# Patient Record
Sex: Female | Born: 1950 | Race: Black or African American | Hispanic: No | Marital: Married | Smoking: Never smoker
Health system: Southern US, Community
[De-identification: ages and names within clinical notes are randomized; demographics above are authoritative.]

## PROBLEM LIST (undated history)

## (undated) DIAGNOSIS — E669 Obesity, unspecified: Secondary | ICD-10-CM

## (undated) DIAGNOSIS — D509 Iron deficiency anemia, unspecified: Secondary | ICD-10-CM

## (undated) DIAGNOSIS — E785 Hyperlipidemia, unspecified: Secondary | ICD-10-CM

## (undated) DIAGNOSIS — R931 Abnormal findings on diagnostic imaging of heart and coronary circulation: Secondary | ICD-10-CM

## (undated) DIAGNOSIS — E119 Type 2 diabetes mellitus without complications: Secondary | ICD-10-CM

## (undated) DIAGNOSIS — K635 Polyp of colon: Secondary | ICD-10-CM

## (undated) DIAGNOSIS — E538 Deficiency of other specified B group vitamins: Secondary | ICD-10-CM

## (undated) DIAGNOSIS — D563 Thalassemia minor: Secondary | ICD-10-CM

## (undated) DIAGNOSIS — I10 Essential (primary) hypertension: Secondary | ICD-10-CM

## (undated) HISTORY — DX: Type 2 diabetes mellitus without complications: E11.9

## (undated) HISTORY — DX: Obesity, unspecified: E66.9

## (undated) HISTORY — PX: UPPER GASTROINTESTINAL ENDOSCOPY: SHX188

## (undated) HISTORY — PX: COLONOSCOPY W/ POLYPECTOMY: SHX1380

## (undated) HISTORY — DX: Iron deficiency anemia, unspecified: D50.9

## (undated) HISTORY — DX: Hyperlipidemia, unspecified: E78.5

## (undated) HISTORY — DX: Abnormal findings on diagnostic imaging of heart and coronary circulation: R93.1

## (undated) HISTORY — DX: Essential (primary) hypertension: I10

## (undated) HISTORY — DX: Thalassemia minor: D56.3

## (undated) HISTORY — DX: Deficiency of other specified B group vitamins: E53.8

## (undated) HISTORY — PX: TUBAL LIGATION: SHX77

---

## 1977-04-29 HISTORY — PX: OTHER SURGICAL HISTORY: SHX169

## 1999-04-30 HISTORY — PX: CHOLECYSTECTOMY: SHX55

## 2000-02-21 ENCOUNTER — Ambulatory Visit (HOSPITAL_COMMUNITY): Admission: RE | Admit: 2000-02-21 | Discharge: 2000-02-22 | Payer: Self-pay | Admitting: Surgery

## 2000-02-21 ENCOUNTER — Encounter (INDEPENDENT_AMBULATORY_CARE_PROVIDER_SITE_OTHER): Payer: Self-pay | Admitting: *Deleted

## 2000-02-26 ENCOUNTER — Encounter: Admission: RE | Admit: 2000-02-26 | Discharge: 2000-05-26 | Payer: Self-pay | Admitting: Surgery

## 2003-09-09 ENCOUNTER — Ambulatory Visit (HOSPITAL_COMMUNITY): Admission: RE | Admit: 2003-09-09 | Discharge: 2003-09-09 | Payer: Self-pay | Admitting: Family Medicine

## 2004-01-08 ENCOUNTER — Emergency Department (HOSPITAL_COMMUNITY): Admission: EM | Admit: 2004-01-08 | Discharge: 2004-01-08 | Payer: Self-pay | Admitting: Emergency Medicine

## 2004-04-06 ENCOUNTER — Ambulatory Visit: Payer: Self-pay | Admitting: Family Medicine

## 2004-07-06 ENCOUNTER — Ambulatory Visit: Payer: Self-pay | Admitting: Family Medicine

## 2004-09-05 ENCOUNTER — Ambulatory Visit: Payer: Self-pay | Admitting: Family Medicine

## 2004-09-10 ENCOUNTER — Ambulatory Visit (HOSPITAL_COMMUNITY): Admission: RE | Admit: 2004-09-10 | Discharge: 2004-09-10 | Payer: Self-pay | Admitting: Family Medicine

## 2004-10-17 ENCOUNTER — Ambulatory Visit: Payer: Self-pay | Admitting: Family Medicine

## 2005-01-08 ENCOUNTER — Ambulatory Visit: Payer: Self-pay | Admitting: Family Medicine

## 2005-02-26 ENCOUNTER — Ambulatory Visit: Payer: Self-pay | Admitting: Family Medicine

## 2005-03-28 ENCOUNTER — Ambulatory Visit (HOSPITAL_COMMUNITY): Admission: RE | Admit: 2005-03-28 | Discharge: 2005-03-28 | Payer: Self-pay | Admitting: General Surgery

## 2005-03-28 ENCOUNTER — Encounter (INDEPENDENT_AMBULATORY_CARE_PROVIDER_SITE_OTHER): Payer: Self-pay | Admitting: General Surgery

## 2005-04-26 ENCOUNTER — Ambulatory Visit: Payer: Self-pay | Admitting: Family Medicine

## 2005-07-25 ENCOUNTER — Ambulatory Visit: Payer: Self-pay | Admitting: Family Medicine

## 2005-09-16 ENCOUNTER — Ambulatory Visit (HOSPITAL_COMMUNITY): Admission: RE | Admit: 2005-09-16 | Discharge: 2005-09-16 | Payer: Self-pay | Admitting: Family Medicine

## 2005-10-25 ENCOUNTER — Ambulatory Visit: Payer: Self-pay | Admitting: Family Medicine

## 2006-01-31 ENCOUNTER — Ambulatory Visit: Payer: Self-pay | Admitting: Family Medicine

## 2006-01-31 ENCOUNTER — Other Ambulatory Visit: Admission: RE | Admit: 2006-01-31 | Discharge: 2006-01-31 | Payer: Self-pay | Admitting: Family Medicine

## 2006-05-28 ENCOUNTER — Encounter: Payer: Self-pay | Admitting: Family Medicine

## 2006-05-28 LAB — CONVERTED CEMR LAB: Hgb A1c MFr Bld: 6.8 % — ABNORMAL HIGH (ref 4.6–6.1)

## 2006-06-02 ENCOUNTER — Ambulatory Visit: Payer: Self-pay | Admitting: Family Medicine

## 2006-09-01 ENCOUNTER — Ambulatory Visit: Payer: Self-pay | Admitting: Family Medicine

## 2006-09-01 LAB — CONVERTED CEMR LAB
Albumin: 3.6 g/dL (ref 3.5–5.2)
BUN: 10 mg/dL (ref 6–23)
CO2: 24 meq/L (ref 19–32)
Chloride: 102 meq/L (ref 96–112)
HDL: 49 mg/dL (ref >39)
LDL Cholesterol: 124 mg/dL — ABNORMAL HIGH (ref 0–99)
Potassium: 4 meq/L (ref 3.5–5.3)
Total Protein: 7.1 g/dL (ref 6.0–8.3)
Triglycerides: 86 mg/dL (ref <150)
VLDL: 17 mg/dL (ref 0–40)

## 2006-09-19 ENCOUNTER — Ambulatory Visit (HOSPITAL_COMMUNITY): Admission: RE | Admit: 2006-09-19 | Discharge: 2006-09-19 | Payer: Self-pay | Admitting: Family Medicine

## 2006-10-14 ENCOUNTER — Ambulatory Visit: Payer: Self-pay | Admitting: Family Medicine

## 2006-11-25 ENCOUNTER — Ambulatory Visit: Payer: Self-pay | Admitting: Family Medicine

## 2007-01-05 ENCOUNTER — Ambulatory Visit: Payer: Self-pay | Admitting: Family Medicine

## 2007-01-06 ENCOUNTER — Encounter: Payer: Self-pay | Admitting: Family Medicine

## 2007-02-03 ENCOUNTER — Ambulatory Visit: Payer: Self-pay | Admitting: Family Medicine

## 2007-02-04 ENCOUNTER — Encounter: Payer: Self-pay | Admitting: Family Medicine

## 2007-02-26 ENCOUNTER — Encounter: Payer: Self-pay | Admitting: Family Medicine

## 2007-02-26 LAB — CONVERTED CEMR LAB
BUN: 16 mg/dL (ref 6–23)
Basophils Relative: 1 % (ref 0–1)
Chloride: 106 meq/L (ref 96–112)
Glucose, Bld: 143 mg/dL — ABNORMAL HIGH (ref 70–99)
LDL Cholesterol: 121 mg/dL — ABNORMAL HIGH (ref 0–99)
Lymphocytes Relative: 20 % (ref 12–46)
Lymphs Abs: 1.3 10*3/uL (ref 0.7–3.3)
Monocytes Relative: 6 % (ref 3–11)
Neutro Abs: 4.5 10*3/uL (ref 1.7–7.7)
Neutrophils Relative %: 70 % (ref 43–77)
Potassium: 4.2 meq/L (ref 3.5–5.3)
RBC: 4.52 M/uL (ref 3.87–5.11)
Total Protein: 6.4 g/dL (ref 6.0–8.3)
Triglycerides: 86 mg/dL (ref <150)
VLDL: 17 mg/dL (ref 0–40)
WBC: 6.4 10*3/uL (ref 4.0–10.5)

## 2007-02-27 ENCOUNTER — Encounter: Payer: Self-pay | Admitting: Family Medicine

## 2007-03-02 ENCOUNTER — Encounter: Payer: Self-pay | Admitting: Family Medicine

## 2007-03-02 ENCOUNTER — Ambulatory Visit: Payer: Self-pay | Admitting: Family Medicine

## 2007-03-02 ENCOUNTER — Other Ambulatory Visit: Admission: RE | Admit: 2007-03-02 | Discharge: 2007-03-02 | Payer: Self-pay | Admitting: Family Medicine

## 2007-03-17 ENCOUNTER — Ambulatory Visit (HOSPITAL_COMMUNITY): Admission: RE | Admit: 2007-03-17 | Discharge: 2007-03-17 | Payer: Self-pay | Admitting: General Surgery

## 2007-04-30 ENCOUNTER — Encounter: Payer: Self-pay | Admitting: Family Medicine

## 2007-05-20 ENCOUNTER — Ambulatory Visit: Payer: Self-pay | Admitting: Family Medicine

## 2007-08-20 ENCOUNTER — Encounter: Payer: Self-pay | Admitting: Family Medicine

## 2007-08-20 DIAGNOSIS — Z794 Long term (current) use of insulin: Secondary | ICD-10-CM

## 2007-08-20 DIAGNOSIS — E1165 Type 2 diabetes mellitus with hyperglycemia: Secondary | ICD-10-CM

## 2007-08-20 DIAGNOSIS — I10 Essential (primary) hypertension: Secondary | ICD-10-CM

## 2007-08-20 DIAGNOSIS — E785 Hyperlipidemia, unspecified: Secondary | ICD-10-CM | POA: Insufficient documentation

## 2007-08-20 LAB — CONVERTED CEMR LAB
ALT: 17 units/L (ref 0–35)
Albumin: 3.8 g/dL (ref 3.5–5.2)
Bilirubin, Direct: <0.1 mg/dL (ref 0.0–0.3)
Cholesterol: 174 mg/dL (ref 0–200)
Glucose, Bld: 80 mg/dL (ref 70–99)
HDL: 47 mg/dL (ref >39)
Potassium: 4.3 meq/L (ref 3.5–5.3)
Sodium: 141 meq/L (ref 135–145)
Total CHOL/HDL Ratio: 3.7
Total Protein: 7.3 g/dL (ref 6.0–8.3)
Triglycerides: 82 mg/dL (ref <150)
VLDL: 16 mg/dL (ref 0–40)

## 2007-08-25 ENCOUNTER — Ambulatory Visit: Payer: Self-pay | Admitting: Family Medicine

## 2007-08-25 ENCOUNTER — Encounter: Payer: Self-pay | Admitting: Family Medicine

## 2007-09-22 ENCOUNTER — Ambulatory Visit (HOSPITAL_COMMUNITY): Admission: RE | Admit: 2007-09-22 | Discharge: 2007-09-22 | Payer: Self-pay | Admitting: Family Medicine

## 2007-11-21 ENCOUNTER — Encounter: Payer: Self-pay | Admitting: Family Medicine

## 2007-11-21 LAB — CONVERTED CEMR LAB: Retic Ct Pct: 0.7 % (ref 0.4–3.1)

## 2007-11-24 ENCOUNTER — Ambulatory Visit: Payer: Self-pay | Admitting: Family Medicine

## 2007-11-24 ENCOUNTER — Encounter: Payer: Self-pay | Admitting: Family Medicine

## 2007-11-24 LAB — CONVERTED CEMR LAB: Glucose, Bld: 196 mg/dL

## 2008-01-25 ENCOUNTER — Ambulatory Visit: Payer: Self-pay | Admitting: Family Medicine

## 2008-03-04 ENCOUNTER — Encounter: Payer: Self-pay | Admitting: Family Medicine

## 2008-03-08 ENCOUNTER — Other Ambulatory Visit: Admission: RE | Admit: 2008-03-08 | Discharge: 2008-03-08 | Payer: Self-pay | Admitting: Family Medicine

## 2008-03-08 ENCOUNTER — Ambulatory Visit: Payer: Self-pay | Admitting: Family Medicine

## 2008-03-08 ENCOUNTER — Encounter: Payer: Self-pay | Admitting: Family Medicine

## 2008-06-06 ENCOUNTER — Encounter: Payer: Self-pay | Admitting: Family Medicine

## 2008-06-06 LAB — CONVERTED CEMR LAB
ALT: 18 units/L (ref 0–35)
Albumin: 3.7 g/dL (ref 3.5–5.2)
HDL: 51 mg/dL (ref >39)
LDL Cholesterol: 103 mg/dL — ABNORMAL HIGH (ref 0–99)
Total CHOL/HDL Ratio: 3.3
Total Protein: 7.2 g/dL (ref 6.0–8.3)
Triglycerides: 82 mg/dL (ref <150)
VLDL: 16 mg/dL (ref 0–40)

## 2008-06-08 ENCOUNTER — Ambulatory Visit: Payer: Self-pay | Admitting: Family Medicine

## 2008-06-08 LAB — CONVERTED CEMR LAB: Blood Glucose, Fasting: 127 mg/dL

## 2008-06-09 LAB — CONVERTED CEMR LAB

## 2008-09-23 ENCOUNTER — Encounter: Payer: Self-pay | Admitting: Family Medicine

## 2008-09-23 LAB — CONVERTED CEMR LAB
Alkaline Phosphatase: 125 units/L — ABNORMAL HIGH (ref 39–117)
Basophils Absolute: 0 10*3/uL (ref 0.0–0.1)
Basophils Relative: 1 % (ref 0–1)
Bilirubin, Direct: 0.1 mg/dL (ref 0.0–0.3)
Eosinophils Relative: 3 % (ref 0–5)
Indirect Bilirubin: 0.2 mg/dL (ref 0.0–0.9)
LDL Cholesterol: 90 mg/dL (ref 0–99)
Lymphocytes Relative: 21 % (ref 12–46)
MCHC: 29.5 g/dL — ABNORMAL LOW (ref 30.0–36.0)
Neutro Abs: 5.8 10*3/uL (ref 1.7–7.7)
Platelets: 364 10*3/uL (ref 150–400)
RDW: 17.1 % — ABNORMAL HIGH (ref 11.5–15.5)
Total Protein: 7.1 g/dL (ref 6.0–8.3)
Triglycerides: 102 mg/dL (ref <150)
VLDL: 20 mg/dL (ref 0–40)

## 2008-09-28 ENCOUNTER — Ambulatory Visit: Payer: Self-pay | Admitting: Family Medicine

## 2008-09-28 LAB — CONVERTED CEMR LAB
Blood Glucose, Fasting: 117 mg/dL
Hgb A1c MFr Bld: 6.8 %

## 2009-01-09 ENCOUNTER — Ambulatory Visit (HOSPITAL_COMMUNITY): Admission: RE | Admit: 2009-01-09 | Discharge: 2009-01-09 | Payer: Self-pay | Admitting: Family Medicine

## 2009-01-20 ENCOUNTER — Ambulatory Visit: Payer: Self-pay | Admitting: Family Medicine

## 2009-01-20 LAB — CONVERTED CEMR LAB
ALT: 18 units/L (ref 0–35)
AST: 19 units/L (ref 0–37)
Albumin: 3.9 g/dL (ref 3.5–5.2)
Bilirubin, Direct: 0.1 mg/dL (ref 0.0–0.3)
Blood Glucose, Fasting: 149 mg/dL
Calcium: 9.3 mg/dL (ref 8.4–10.5)
Cholesterol: 148 mg/dL (ref 0–200)
Glucose, Bld: 121 mg/dL — ABNORMAL HIGH (ref 70–99)
HDL: 43 mg/dL (ref >39)
Hgb A1c MFr Bld: 6.8 %
Potassium: 4.6 meq/L (ref 3.5–5.3)
Sodium: 139 meq/L (ref 135–145)
Total CHOL/HDL Ratio: 3.4
Total Protein: 7.1 g/dL (ref 6.0–8.3)
Triglycerides: 91 mg/dL (ref <150)
VLDL: 18 mg/dL (ref 0–40)

## 2009-03-27 LAB — CONVERTED CEMR LAB
Creatinine, Urine: 156.6 mg/dL
Microalb, Ur: 1.17 mg/dL (ref 0.00–1.89)

## 2009-06-01 ENCOUNTER — Ambulatory Visit: Payer: Self-pay | Admitting: Family Medicine

## 2009-06-01 LAB — CONVERTED CEMR LAB: Glucose, Bld: 180 mg/dL

## 2009-07-31 ENCOUNTER — Telehealth: Payer: Self-pay | Admitting: Family Medicine

## 2009-08-04 ENCOUNTER — Encounter: Payer: Self-pay | Admitting: Family Medicine

## 2009-09-14 ENCOUNTER — Other Ambulatory Visit: Admission: RE | Admit: 2009-09-14 | Discharge: 2009-09-14 | Payer: Self-pay | Admitting: Family Medicine

## 2009-09-14 ENCOUNTER — Ambulatory Visit: Payer: Self-pay | Admitting: Family Medicine

## 2009-09-14 DIAGNOSIS — R000 Tachycardia, unspecified: Secondary | ICD-10-CM

## 2009-09-18 LAB — CONVERTED CEMR LAB
BUN: 27 mg/dL — ABNORMAL HIGH (ref 6–23)
Chloride: 93 meq/L — ABNORMAL LOW (ref 96–112)
Glucose, Bld: 181 mg/dL — ABNORMAL HIGH (ref 70–99)
Hgb A1c MFr Bld: 7.8 % — ABNORMAL HIGH (ref <5.7)
Potassium: 4.6 meq/L (ref 3.5–5.3)
Sodium: 133 meq/L — ABNORMAL LOW (ref 135–145)

## 2009-09-19 ENCOUNTER — Telehealth: Payer: Self-pay | Admitting: Family Medicine

## 2009-10-13 LAB — CONVERTED CEMR LAB
BUN: 16 mg/dL (ref 6–23)
Chloride: 104 meq/L (ref 96–112)
Glucose, Bld: 94 mg/dL (ref 70–99)
Potassium: 4.4 meq/L (ref 3.5–5.3)
Sodium: 141 meq/L (ref 135–145)

## 2010-01-04 ENCOUNTER — Ambulatory Visit: Payer: Self-pay | Admitting: Family Medicine

## 2010-01-11 ENCOUNTER — Ambulatory Visit (HOSPITAL_COMMUNITY): Admission: RE | Admit: 2010-01-11 | Discharge: 2010-01-11 | Payer: Self-pay | Admitting: Family Medicine

## 2010-01-11 LAB — CONVERTED CEMR LAB
BUN: 23 mg/dL (ref 6–23)
Bilirubin, Direct: 0.1 mg/dL (ref 0.0–0.3)
CO2: 31 meq/L (ref 19–32)
Glucose, Bld: 192 mg/dL — ABNORMAL HIGH (ref 70–99)
Hgb A1c MFr Bld: 7.5 % — ABNORMAL HIGH (ref <5.7)
Indirect Bilirubin: 0.3 mg/dL (ref 0.0–0.9)
Potassium: 4.4 meq/L (ref 3.5–5.3)
Sodium: 137 meq/L (ref 135–145)
Total Bilirubin: 0.4 mg/dL (ref 0.3–1.2)
Total CHOL/HDL Ratio: 3.5
VLDL: 16 mg/dL (ref 0–40)

## 2010-02-13 ENCOUNTER — Ambulatory Visit: Payer: Self-pay | Admitting: Family Medicine

## 2010-02-13 LAB — HM DIABETES FOOT EXAM

## 2010-02-22 ENCOUNTER — Encounter (INDEPENDENT_AMBULATORY_CARE_PROVIDER_SITE_OTHER): Payer: Self-pay | Admitting: *Deleted

## 2010-04-15 LAB — CONVERTED CEMR LAB
CO2: 23 meq/L (ref 19–32)
Chloride: 104 meq/L (ref 96–112)
Glucose, Bld: 272 mg/dL — ABNORMAL HIGH (ref 70–99)
Potassium: 4 meq/L (ref 3.5–5.3)
Sodium: 138 meq/L (ref 135–145)

## 2010-04-17 ENCOUNTER — Ambulatory Visit: Payer: Self-pay | Admitting: Family Medicine

## 2010-04-18 ENCOUNTER — Encounter: Payer: Self-pay | Admitting: Family Medicine

## 2010-04-18 LAB — CONVERTED CEMR LAB
Creatinine, Urine: 145.4 mg/dL
Microalb Creat Ratio: 12.7 mg/g (ref 0.0–30.0)

## 2010-05-20 ENCOUNTER — Encounter: Payer: Self-pay | Admitting: Family Medicine

## 2010-05-31 NOTE — Assessment & Plan Note (Signed)
Summary: physical   Vital Signs:  Patient profile:   60 year old female Menstrual status:  postmenopausal Height:      66 inches Weight:      245 pounds BMI:     39.69 O2 Sat:      97 % Pulse rate:   120 / minute Pulse rhythm:   regular Resp:     16 per minute BP sitting:   118 / 78  (left arm) Cuff size:   large  Vitals Entered By: Everitt Amber LPN (Sep 14, 2009 9:02 AM)  Nutrition Counseling: Patient's BMI is greater than 25 and therefore counseled on weight management options. CC: CPE  Vision Screening:Left eye with correction: 20 / 20 Right eye with correction: 20 / 20 Both eyes with correction: 20 / 20  Color vision testing: normal      Vision Entered By: Everitt Amber LPN (Sep 14, 2009 9:05 AM)   CC:  CPE.  History of Present Illness: Reports  that she has been  doing well. Denies recent fever or chills. Denies sinus pressure, nasal congestion , ear pain or sore throat. Denies chest congestion, or cough productive of sputum. Denies chest pain, palpitations, PND, orthopnea or leg swelling. Denies abdominal pain, nausea, vomitting, diarrhea or constipation. Denies change in bowel movements or bloody stool. Denies dysuria , frequency, incontinence or hesitancy. Denies  joint pain, swelling, or reduced mobility. Denies headaches, vertigo, seizures. Denies depression, anxiety or insomnia. Denies  rash, lesions, or itch.       Current Medications (verified): 1)  Aspirin 81 Mg  Tbec (Aspirin) .... Take 1 Tablet By Mouth Once A Day 2)  Glipizide 10 Mg  Tabs (Glipizide) .... Take 1 Tablet By Mouth Two Times A Day 3)  Metformin Hcl 1000 Mg  Tabs (Metformin Hcl) .... Take 1 Tablet By Mouth Two Times A Day 4)  Lisinopril 20 Mg  Tabs (Lisinopril) .... Take One Half Tablet By Mouth Daily 5)  Oscal 500/200 D-3 500-200 Mg-Unit  Tabs (Calcium-Vitamin D) .... Take 1 Tablet By Mouth Once A Day 6)  One-A-Day Weight Smart Advance   Tabs (Multiple Vitamins-Minerals) .... Take  1 Tablet By Mouth Once A Day 7)  Mevacor 40 Mg Tabs (Lovastatin) .... 2 Tablets At Bedtime  Allergies (verified): 1)  ! Penicillin 2)  ! * Tetanus Vaccine  Review of Systems      See HPI Eyes:  Denies blurring, discharge, and double vision; repoorts nl eye exam in Jan 2011. Endo:  Denies cold intolerance, excessive hunger, excessive thirst, excessive urination, heat intolerance, polyuria, and weight change; tests fastings daily and the range between 110 to 130 generally, still not exercising and her weight is essentially stable. Heme:  Denies abnormal bruising, bleeding, and enlarge lymph nodes. Allergy:  Denies hives or rash, itching eyes, persistent infections, seasonal allergies, and sneezing.  Physical Exam  General:  Well-developed,obese,,in no acute distress; alert,appropriate and cooperative throughout examination Head:  Normocephalic and atraumatic without obvious abnormalities. No apparent alopecia or balding. Eyes:  No corneal or conjunctival inflammation noted. EOMI. Perrla. Funduscopic exam benign, without hemorrhages, exudates or papilledema. Vision grossly normal. Ears:  External ear exam shows no significant lesions or deformities.  Otoscopic examination reveals clear canals, tympanic membranes are intact bilaterally without bulging, retraction, inflammation or discharge. Hearing is grossly normal bilaterally. Nose:  External nasal examination shows no deformity or inflammation. Nasal mucosa are pink and moist without lesions or exudates. Mouth:  pharynx pink and moist and fair  dentition.   Neck:  No deformities, masses, or tenderness noted. Chest Wall:  No deformities, masses, or tenderness noted. Breasts:  No mass, nodules, thickening, tenderness, bulging, retraction, inflamation, nipple discharge or skin changes noted.   Lungs:  Normal respiratory effort, chest expands symmetrically. Lungs are clear to auscultation, no crackles or wheezes. Heart:  Normal rate and regular  rhythm. S1 and S2 normal without gallop, murmur, click, rub or other extra sounds. Abdomen:  Bowel sounds positive,abdomen soft and non-tender without masses, organomegaly or hernias noted. Rectal:  No external abnormalities noted. Normal sphincter tone. No rectal masses or tenderness.Guaic negative stool Genitalia:  Normal introitus for age, no external lesions, no vaginal discharge, mucosa pink and moist, no vaginal or cervical lesions, no vaginal atrophy, no friaility or hemorrhage, normal uterus size and position, no adnexal masses or tenderness Msk:  No deformity or scoliosis noted of thoracic or lumbar spine.   Pulses:  R and L carotid,radial,femoral,dorsalis pedis and posterior tibial pulses are full and equal bilaterally Extremities:  No clubbing, cyanosis, edema, or deformity noted with normal full range of motion of all joints.   Neurologic:  No cranial nerve deficits noted. Station and gait are normal. Plantar reflexes are down-going bilaterally. DTRs are symmetrical throughout. Sensory, motor and coordinative functions appear intact. Skin:  Intact without suspicious lesions or rashes Cervical Nodes:  No lymphadenopathy noted Axillary Nodes:  No palpable lymphadenopathy Inguinal Nodes:  No significant adenopathy Psych:  Cognition and judgment appear intact. Alert and cooperative with normal attention span and concentration. No apparent delusions, illusions, hallucinations  Diabetes Management Exam:    Foot Exam (with socks and/or shoes not present):       Sensory-Monofilament:          Left foot: normal          Right foot: normal       Inspection:          Left foot: normal          Right foot: normal       Nails:          Left foot: normal          Right foot: normal   Impression & Recommendations:  Problem # 1:  UNSPECIFIED TACHYCARDIA (ICD-785.0) Assessment Comment Only  Orders: EKG w/ Interpretation (93000)NSR, no ischemia, heart rate 100 on EKG  Problem # 2:   DIABETES MELLITUS, TYPE II (ICD-250.00) Assessment: Comment Only  Her updated medication list for this problem includes:    Aspirin 81 Mg Tbec (Aspirin) .Marland Kitchen... Take 1 tablet by mouth once a day    Glipizide 10 Mg Tabs (Glipizide) .Marland Kitchen... Take 1 tablet by mouth two times a day    Metformin Hcl 1000 Mg Tabs (Metformin hcl) .Marland Kitchen... Take 1 tablet by mouth two times a day    Lisinopril 20 Mg Tabs (Lisinopril) .Marland Kitchen... Take one half tablet by mouth daily  Orders: T- Hemoglobin A1C ([id number])  Labs Reviewed: Creat: 1.02 (01/20/2009)    Reviewed HgBA1c results: 7.9 (06/01/2009)  6.8 (01/20/2009)  Problem # 3:  HYPERTENSION (ICD-401.9) Assessment: Unchanged  Her updated medication list for this problem includes:    Lisinopril 20 Mg Tabs (Lisinopril) .Marland Kitchen... Take one half tablet by mouth daily  Orders: T-Basic Metabolic Panel ([id number])  BP today: 118/78 Prior BP: 118/80 (06/01/2009)  Labs Reviewed: K+: 4.6 (01/20/2009) Creat: : 1.02 (01/20/2009)   Chol: 151 (06/17/2009)   HDL: 48 (06/17/2009)   LDL: 89 (06/17/2009)   TG: 72 (  06/17/2009)  Problem # 4:  HYPERLIPIDEMIA (ICD-272.4) Assessment: Comment Only  Her updated medication list for this problem includes:    Mevacor 40 Mg Tabs (Lovastatin) .Marland Kitchen... 2 tablets at bedtime  Labs Reviewed: SGOT: 15 (06/17/2009)   SGPT: 17 (06/17/2009)   HDL:48 (06/17/2009), 43 (01/20/2009)  LDL:89 (06/17/2009), 87 (01/20/2009)  Chol:151 (06/17/2009), 148 (01/20/2009)  Trig:72 (06/17/2009), 91 (01/20/2009)  Problem # 5:  OBESITY (ICD-278.00) Assessment: Unchanged  Ht: 66 (09/14/2009)   Wt: 245 (09/14/2009)   BMI: 39.69 (09/14/2009)  Complete Medication List: 1)  Aspirin 81 Mg Tbec (Aspirin) .... Take 1 tablet by mouth once a day 2)  Glipizide 10 Mg Tabs (Glipizide) .... Take 1 tablet by mouth two times a day 3)  Metformin Hcl 1000 Mg Tabs (Metformin hcl) .... Take 1 tablet by mouth two times a day 4)  Lisinopril 20 Mg Tabs (Lisinopril) .... Take one  half tablet by mouth daily 5)  Oscal 500/200 D-3 500-200 Mg-unit Tabs (Calcium-vitamin d) .... Take 1 tablet by mouth once a day 6)  One-a-day Weight Smart Advance Tabs (Multiple vitamins-minerals) .... Take 1 tablet by mouth once a day 7)  Mevacor 40 Mg Tabs (Lovastatin) .... 2 tablets at bedtime  Other Orders: T-Vitamin D (25-Hydroxy) [phone]) Radiology Referral (Radiology) Pneumococcal Vaccine (09811) Admin 1st Vaccine (91478) Admin 1st Vaccine [facility]) [phone]) Pap Smear (30865) Hemoccult Guaiac-1 spec.(in office) (82270)  Patient Instructions: 1)  Please schedule a follow-up appointment in 3 months. 2)  It is important that you exercise regularly at least 20 minutes 5 times a week. If you develop chest pain, have severe difficulty breathing, or feel very tired , stop exercising immediately and seek medical attention. 3)  You need to lose weight. Consider a lower calorie diet and regular exercise.  4)  HbgA1C prior to visit, ICD-9: 5)  BMP prior to visit, ICD-9:  today 6)  vitamin d level 7)  YOU WILL GET A PNEUMONIA SHOT TODAY     Pneumovax    Vaccine Type: Pneumovax    Site: right deltoid    Mfr: Merck    Dose: 0.5 ml    Route: IM    Given by: Everitt Amber LPN    Exp. Date: 05/25/2010    Lot #: [id number]    Laboratory Results    Stool - Occult Blood Hemmoccult #1: negative Date: 09/14/2009 Comments: [id number] 9R 8/11  118 10 12

## 2010-05-31 NOTE — Assessment & Plan Note (Signed)
Summary: office visit   Vital Signs:  Patient profile:   60 year old female Menstrual status:  postmenopausal Height:      66 inches Weight:      244.13 pounds BMI:     39.55 O2 Sat:      97 % on Room air Pulse rate:   88 / minute Pulse rhythm:   regular Resp:     16 per minute BP sitting:   120 / 70  (left arm)  Vitals Entered By: Adella Hare LPN (January 04, 2010 9:23 AM)  Nutrition Counseling: Patient's BMI is greater than 25 and therefore counseled on weight management options.  O2 Flow:  Room air CC: follow-up visit Is Patient Diabetic? Yes Did you bring your meter with you today? No Pain Assessment Patient in pain? no        CC:  follow-up visit.  History of Present Illness: Reports  that she has been  doing well. She has not however been testing her sugars, has started eating cupcakes, and believes that the sugar is high Denies recent fever or chills. Denies sinus pressure, nasal congestion , ear pain or sore throat. Denies chest congestion, or cough productive of sputum. Denies chest pain, palpitations, PND, orthopnea or leg swelling. Denies abdominal pain, nausea, vomitting, diarrhea or constipation. Denies change in bowel movements or bloody stool. Denies dysuria , frequency, incontinence or hesitancy. Denies  joint pain, swelling, or reduced mobility. Denies headaches, vertigo, seizures. Denies depression, anxiety or insomnia. Denies  rash, lesions, or itch.     Current Medications (verified): 1)  Aspirin 81 Mg  Tbec (Aspirin) .... Take 1 Tablet By Mouth Once A Day 2)  Glipizide 10 Mg  Tabs (Glipizide) .... Take 1 Tablet By Mouth Two Times A Day 3)  Metformin Hcl 1000 Mg  Tabs (Metformin Hcl) .... Take 1 Tablet By Mouth Two Times A Day 4)  Lisinopril 20 Mg  Tabs (Lisinopril) .... Take One Half Tablet By Mouth Daily 5)  Oscal 500/200 D-3 500-200 Mg-Unit  Tabs (Calcium-Vitamin D) .... Take 1 Tablet By Mouth Once A Day 6)  One-A-Day Weight Smart  Advance   Tabs (Multiple Vitamins-Minerals) .... Take 1 Tablet By Mouth Once A Day 7)  Mevacor 40 Mg Tabs (Lovastatin) .... 2 Tablets At Bedtime 8)  Vitamin B-12 1000 Mcg Tabs (Cyanocobalamin) .... One Tab By Mouth Once Daily 9)  Vitamin D (Ergocalciferol) 50000 Unit Caps (Ergocalciferol) .... One Cap By Mouth Every Week  Allergies (verified): 1)  ! Penicillin 2)  ! * Tetanus Vaccine  Review of Systems      See HPI General:  Complains of fatigue. Eyes:  Complains of vision loss-both eyes; denies blurring and discharge; wears corrective lenses. Psych:  Denies anxiety and depression. Endo:  Complains of excessive thirst and excessive urination. Heme:  Denies abnormal bruising and bleeding. Allergy:  Denies hives or rash and itching eyes.  Physical Exam  General:  Well-developed,obese,,in no acute distress; alert,appropriate and cooperative throughout examination HEENT: No facial asymmetry,  EOMI, No sinus tenderness, TM's Clear, oropharynx  pink and moist.   Chest: Clear to auscultation bilaterally.  CVS: S1, S2, No murmurs, No S3.   Abd: Soft, Nontender.  MS: Adequate ROM spine, hips, shoulders and knees.  Ext: No edema.   CNS: CN 2-12 intact, power tone and sensation normal throughout.   Skin: Intact, no visible lesions or rashes.  Psych: Good eye contact, normal affect.  Memory intact, not anxious or depressed appearing.  Impression & Recommendations:  Problem # 1:  HYPERTENSION (ICD-401.9) Assessment Unchanged  Her updated medication list for this problem includes:    Lisinopril 20 Mg Tabs (Lisinopril) .Marland Kitchen... Take one half tablet by mouth daily  Orders: T-Basic Metabolic Panel [id number])  BP today: 120/70 Prior BP: 118/78 (09/14/2009)  Labs Reviewed: K+: 4.4 (10/12/2009) Creat: : 0.97 (10/12/2009)   Chol: 151 (06/17/2009)   HDL: 48 (06/17/2009)   LDL: 89 (06/17/2009)   TG: 72 (06/17/2009)  Problem # 2:  OBESITY (ICD-278.00) Assessment: Unchanged  Ht: 66  (01/04/2010)   Wt: 244.13 (01/04/2010)   BMI: 39.55 (01/04/2010)  Problem # 3:  DIABETES MELLITUS, TYPE II (ICD-250.00) Assessment: Deteriorated  Her updated medication list for this problem includes:    Aspirin 81 Mg Tbec (Aspirin) .Marland Kitchen... Take 1 tablet by mouth once a day    Glipizide 10 Mg Tabs (Glipizide) .Marland Kitchen... Take 1 tablet by mouth two times a day    Metformin Hcl 1000 Mg Tabs (Metformin hcl) .Marland Kitchen... Take 1 tablet by mouth two times a day    Lisinopril 20 Mg Tabs (Lisinopril) .Marland Kitchen... Take one half tablet by mouth daily  Orders: T- Hemoglobin A1C ([id number])  Labs Reviewed: Creat: 0.97 (10/12/2009)    Reviewed HgBA1c results: 7.8 (09/14/2009)  7.9 (06/01/2009)  Complete Medication List: 1)  Aspirin 81 Mg Tbec (Aspirin) .... Take 1 tablet by mouth once a day 2)  Glipizide 10 Mg Tabs (Glipizide) .... Take 1 tablet by mouth two times a day 3)  Metformin Hcl 1000 Mg Tabs (Metformin hcl) .... Take 1 tablet by mouth two times a day 4)  Lisinopril 20 Mg Tabs (Lisinopril) .... Take one half tablet by mouth daily 5)  Oscal 500/200 D-3 500-200 Mg-unit Tabs (Calcium-vitamin d) .... Take 1 tablet by mouth once a day 6)  One-a-day Weight Smart Advance Tabs (Multiple vitamins-minerals) .... Take 1 tablet by mouth once a day 7)  Mevacor 40 Mg Tabs (Lovastatin) .... 2 tablets at bedtime 8)  Vitamin B-12 1000 Mcg Tabs (Cyanocobalamin) .... One tab by mouth once daily 9)  Vitamin D (ergocalciferol) 50000 Unit Caps (Ergocalciferol) .... One cap by mouth every week  Other Orders: Influenza Vaccine NON MCR (29562) T-Hepatic Function [phone]) T-Lipid Profile ([id number]) Radiology Referral (Radiology)  Patient Instructions: 1)  F/u in 6 weerks. 2)  pLS test once daily record and bring the logbook. 3)  fasting 80 to 120, or 2 hrs after any meal or bedtime :130 to 170. 4)   follow qa 1600cal diet pls, we will give you. 5)  It is important that you exercise regularly at least 30 minutes 5  times a week. If you develop chest pain, have severe difficulty breathing, or feel very tired , stop exercising immediately and seek medical attention. 6)  fasting labs today Prescriptions: METFORMIN HCL 1000 MG  TABS (METFORMIN HCL) Take 1 tablet by mouth two times a day  #180 Tablet x 0   Entered by:   Adella Hare LPN   Authorized by:   Syliva Overman MD   Signed by:   Adella Hare LPN on 24/40/1027   Method used:   Electronically to        [facility]  [address] (retail)       [address]       [place], [place]  [address]       Ph: [phone]       Fax: [phone]  RxID:   [id number]    Influenza Vaccine    Vaccine Type: Fluvax Non-MCR    Site: right deltoid    Mfr: novartis    Dose: 0.5 ml    Route: IM    Given by: Adella Hare LPN    Exp. Date: 08/2010    Lot #: [id number]    VIS given: 11/21/09 version given January 04, 2010.

## 2010-05-31 NOTE — Letter (Signed)
Summary: phone notes  phone notes   Imported By: Curtis Sites 10/17/2009 14:41:09  _____________________________________________________________________  External Attachment:    Type:   Image     Comment:   External Document

## 2010-05-31 NOTE — Assessment & Plan Note (Signed)
Summary: office visit   Vital Signs:  Patient profile:   60 year old female Menstrual status:  postmenopausal Height:      66 inches Weight:      248 pounds BMI:     40.17 O2 Sat:      98 % on Room air Pulse rate:   93 / minute Pulse rhythm:   regular Resp:     16 per minute BP sitting:   120 / 82  (left arm)  Vitals Entered By: Adella Hare LPN (April 17, 2010 9:15 AM)  Nutrition Counseling: Patient's BMI is greater than 25 and therefore counseled on weight management options.  O2 Flow:  Room air CC: follow-up visit Is Patient Diabetic? Yes Did you bring your meter with you today? No   CC:  follow-up visit.  History of Present Illness: Reports  that she has been  doing well. She does however state she has been lax in her diet,and has not been testing hersugars regulalrly. She denies symptoms ofhyperglycemia. 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 Denies sinus pressure, nasal congestion , ear pain or sore throat. Denies chest congestion, or cough productive of sputum. Denies chest pain, palpitations, PND, orthopnea or leg swelling. Denies abdominal pain, nausea, vomitting, diarrhea or constipation. Denies change in bowel movements or bloody stool. Denies dysuria , frequency, incontinence or hesitancy. Denies  joint pain, swelling, or reduced mobility. Denies headaches, vertigo, seizures. Denies depression, anxiety or insomnia. Denies  rash, lesions, or itch.     Current Medications (verified): 1)  Aspirin 81 Mg  Tbec (Aspirin) .... Take 1 Tablet By Mouth Once A Day 2)  Glipizide 10 Mg  Tabs (Glipizide) .... Take 1 Tablet By Mouth Two Times A Day 3)  Metformin Hcl 1000 Mg  Tabs (Metformin Hcl) .... Take 1 Tablet By Mouth Two Times A Day 4)  Lisinopril 20 Mg  Tabs (Lisinopril) .... One Tab By Mouth Once Daily 5)  Vitamin D  (Ergocalciferol) 50000 Unit Caps (Ergocalciferol) .... One Cap By Mouth Every Week 6)  Multivitamins  Tabs (Multiple Vitamin) .... One Tab By Mouth Once Daily 7)  Citracal Petites/vitamin D 200-250 Mg-Unit Tabs (Calcium Citrate-Vitamin D) .... One Tab By Mouth Once Daily 8)  Lovastatin 20 Mg Tabs (Lovastatin) .... Four Tabs By Mouth At Bedtime 9)  Freestyle Lite Test  Strp (Glucose Blood) .... Twice Daily Testing Dx: 250.00 Uncontrolled  Allergies (verified): 1)  ! Penicillin 2)  ! * Tetanus Vaccine  Review of Systems      See HPI General:  Complains of fatigue; still no  regular exercise. Eyes:  Denies discharge, eye pain, and red eye. Endo:  uncontrolled eating and elevated blood sugars when she does test, over 200. Heme:  Denies abnormal bruising and bleeding. Allergy:  Denies hives or rash, itching eyes, persistent infections, and seasonal allergies.  Physical Exam  General:  Well-developed,obese,in no acute distress; alert,appropriate and cooperative throughout examination HEENT: No facial asymmetry,  EOMI, No sinus tenderness, TM's Clear, oropharynx  pink and moist.   Chest: Clear to auscultation bilaterally.  CVS: S1, S2, No murmurs, No S3.   Abd: Soft, Nontender.  MS: Adequate ROM spine, hips, shoulders and knees.  Ext: No edema.   CNS: CN 2-12 intact, power tone and sensation normal throughout.   Skin: Intact, no visible lesions or rashes.  Psych: Good eye  contact, normal affect.  Memory intact, not anxious or depressed appearing.    Impression & Recommendations:  Problem # 1:  HYPERTENSION (ICD-401.9) Assessment Unchanged  Her updated medication list for this problem includes:    Lisinopril 20 Mg Tabs (Lisinopril) ..... One tab by mouth once daily  Orders: T-CMP with estimated GFR ([id number])  BP today: 120/82 Prior BP: 124/76 (02/13/2010)  Labs Reviewed: K+: 4.0 (04/14/2010) Creat: : 1.02 (04/14/2010)   Chol: 163 (01/04/2010)   HDL: 47 (01/04/2010)   LDL:  100 (01/04/2010)   TG: 78 (01/04/2010)  Problem # 2:  HYPERLIPIDEMIA (ICD-272.4) Assessment: Unchanged  Her updated medication list for this problem includes:    Lovastatin 20 Mg Tabs (Lovastatin) .Marland Kitchen... Four tabs by mouth at bedtime Low fat dietdiscussed and encouraged  Orders: T-Lipid Profile [phone])  Labs Reviewed: SGOT: 15 (01/04/2010)   SGPT: 17 (01/04/2010)   HDL:47 (01/04/2010), 48 (06/17/2009)  LDL:100 (01/04/2010), 89 (82/95/6213)  Chol:163 (01/04/2010), 151 (06/17/2009)  Trig:78 (01/04/2010), 72 (06/17/2009)  Problem # 3:  DIABETES MELLITUS, TYPE II (ICD-250.00) Assessment: Deteriorated  The following medications were removed from the medication list:    Metformin Hcl 1000 Mg Tabs (Metformin hcl) .Marland Kitchen... Take 1 tablet by mouth two times a day    Kombiglyze Xr 2.08-998 Mg Xr24h-tab (Saxagliptin-metformin) .Marland Kitchen..Marland Kitchen Two tablets every morning Her updated medication list for this problem includes:    Aspirin 81 Mg Tbec (Aspirin) .Marland Kitchen... Take 1 tablet by mouth once a day    Glipizide 10 Mg Tabs (Glipizide) .Marland Kitchen... Take 1 tablet by mouth two times a day    Lisinopril 20 Mg Tabs (Lisinopril) ..... One tab by mouth once daily    Janumet 50-500 Mg Tabs (Sitagliptin-metformin hcl) .Marland Kitchen... Take 1 tablet by mouth two times a day Patient advised to reduce carbs and sweets, commit to regular physical activity, take meds as prescribed, test blood sugars as directed, and attempt to lose weight , to improve blood sugar control.  Orders: T-Urine Microalbumin w/creat. ratio [phone]) T- Hemoglobin A1C [phone])  Labs Reviewed: Creat: 1.02 (04/14/2010)    Reviewed HgBA1c results: 9.0 (04/14/2010)  7.5 (01/04/2010)  Problem # 4:  OBESITY (ICD-278.00) Assessment: Unchanged  Ht: 66 (04/17/2010)   Wt: 248 (04/17/2010)   BMI: 40.17 (04/17/2010) therapeutic lifestyle change discussed and encouraged  Complete Medication List: 1)  Aspirin 81 Mg Tbec (Aspirin) .... Take 1 tablet by  mouth once a day 2)  Glipizide 10 Mg Tabs (Glipizide) .... Take 1 tablet by mouth two times a day 3)  Lisinopril 20 Mg Tabs (Lisinopril) .... One tab by mouth once daily 4)  Vitamin D (ergocalciferol) 50000 Unit Caps (Ergocalciferol) .... One cap by mouth every week 5)  Multivitamins Tabs (Multiple vitamin) .... One tab by mouth once daily 6)  Citracal Petites/vitamin D 200-250 Mg-unit Tabs (Calcium citrate-vitamin d) .... One tab by mouth once daily 7)  Lovastatin 20 Mg Tabs (Lovastatin) .... Four tabs by mouth at bedtime 8)  Freestyle Lite Test Strp (Glucose blood) .... Twice daily testing dx: 250.00 uncontrolled 9)  Janumet 50-500 Mg Tabs (Sitagliptin-metformin hcl) .... Take 1 tablet by mouth two times a day  Patient Instructions: 1)  Please schedule a follow-up appointment in 3 months. 2)  It is important that you exercise regularly at least 20 minutes 5 times a week. If you develop chest pain, have severe difficulty breathing, or feel very tired , stop exercising immediately and seek medical attention. 3)  You need to lose weight. Consider a lower  calorie diet and regular exercise.  4)  pls stop eating cakes and sweets and drinking sweet drinks. 5)  You need twice daily at different times and record. 6)  call in 2 to 4 weeks if numbers are still high you will need insulin 7)  you will get a book and the ranges are highlighted 8)  sTOP metformin today pls , start eITHER kombiglyze oR janumet, pLS call and let us know which is affordable , fill only one I will give you printed scripts  and coupons 9)  Continue glipizide as before 10)  microalb today 11)  hBA1c, egfr and cmp, fasting cmp and lipid and hBA1C in 3 months Prescriptions: LOVASTATIN 20 MG TABS (LOVASTATIN) four tabs by mouth at bedtime  #360 x 0   Entered by:   Adella Hare LPN   Authorized by:   Syliva Overman MD   Signed by:   Adella Hare LPN on 16/01/9603   Method used:   Electronically to        [facility]  [address]  (retail)       [address]       [place], [place]  [address]       Ph: [phone]       Fax: [phone]   RxID:   [id number] KOMBIGLYZE XR 2.08-998 MG XR24H-TAB (SAXAGLIPTIN-METFORMIN) two tablets every morning  #60 x 3   Entered and Authorized by:   Syliva Overman MD   Signed by:   Syliva Overman MD on 04/17/2010   Method used:   Print then Give to Patient   RxID:   [id number] JANUMET 50-500 MG TABS (SITAGLIPTIN-METFORMIN HCL) Take 1 tablet by mouth two times a day  #60 x 3   Entered and Authorized by:   Syliva Overman MD   Signed by:   Syliva Overman MD on 04/17/2010   Method used:   Print then Give to Patient   RxID:   [id number]    Orders Added: 1)  Est. Patient Level IV [18841] 2)  T-Urine Microalbumin w/creat. ratio [[id number]] 3)  T-CMP with estimated GFR [[address]] 4)  T- Hemoglobin A1C [83036-23375] 5)  T-Lipid Profile [66063-01601]

## 2010-05-31 NOTE — Letter (Signed)
Summary: lab  lab   Imported By: Curtis Sites 10/17/2009 14:40:32  _____________________________________________________________________  External Attachment:    Type:   Image     Comment:   External Document

## 2010-05-31 NOTE — Letter (Signed)
Summary: MEDICAL RELEASE  MEDICAL RELEASE   Imported By: Lind Guest 08/04/2009 10:27:45  _____________________________________________________________________  External Attachment:    Type:   Image     Comment:   External Document

## 2010-05-31 NOTE — Assessment & Plan Note (Signed)
Summary: office visit   Vital Signs:  Patient profile:   60 year old female Menstrual status:  postmenopausal Height:      66 inches Weight:      244.75 pounds BMI:     39.65 O2 Sat:      97 % on Room air Pulse rate:   99 / minute Pulse rhythm:   regular Resp:     16 per minute BP sitting:   124 / 76  (left arm)  Vitals Entered By: Adella Hare LPN (February 13, 2010 10:33 AM)  Nutrition Counseling: Patient's BMI is greater than 25 and therefore counseled on weight management options.  O2 Flow:  Room air CC: follow-up visit Is Patient Diabetic? Yes Did you bring your meter with you today? Yes Pain Assessment Patient in pain? no        CC:  follow-up visit.  History of Present Illness: Pt i primamrily for review of uncontrolled blood sugars, which appears to have improved. Reports  thatshe has been doing well. She still does not exercise, has lost no weight and has not significantly changed her diet Denies recent fever or chills. Denies sinus pressure, nasal congestion , ear pain or sore throat. Denies chest congestion, or cough productive of sputum. Denies chest pain, palpitations, PND, orthopnea or leg swelling. Denies abdominal pain, nausea, vomitting, diarrhea or constipation. Denies change in bowel movements or bloody stool. Denies dysuria , frequency, incontinence or hesitancy. Denies  joint pain, swelling, or reduced mobility. Denies headaches, vertigo, seizures. Denies depression, anxiety or insomnia. Denies  rash, lesions, or itch.     Current Medications (verified): 1)  Aspirin 81 Mg  Tbec (Aspirin) .... Take 1 Tablet By Mouth Once A Day 2)  Glipizide 10 Mg  Tabs (Glipizide) .... Take 1 Tablet By Mouth Two Times A Day 3)  Metformin Hcl 1000 Mg  Tabs (Metformin Hcl) .... Take 1 Tablet By Mouth Two Times A Day 4)  Lisinopril 20 Mg  Tabs (Lisinopril) .... One Tab By Mouth Once Daily 5)  Vitamin D (Ergocalciferol) 50000 Unit Caps (Ergocalciferol) .... One  Cap By Mouth Every Week 6)  Multivitamins  Tabs (Multiple Vitamin) .... One Tab By Mouth Once Daily 7)  Citracal Petites/vitamin D 200-250 Mg-Unit Tabs (Calcium Citrate-Vitamin D) .... One Tab By Mouth Once Daily 8)  Lovastatin 20 Mg Tabs (Lovastatin) .... Four Tabs By Mouth At Bedtime 9)  Freestyle Lite Test  Strp (Glucose Blood) .... Once Daily Testing Dx: 250.00  Allergies (verified): 1)  ! Penicillin 2)  ! * Tetanus Vaccine  Review of Systems      See HPI General:  Complains of fatigue. Eyes:  Denies blurring and discharge. Endo:  Denies excessive thirst and excessive urination; testing daily, notes improved numbers, pt hasmeter with her. Allergy:  Denies hives or rash and itching eyes.  Physical Exam  General:  Well-developed,obese,,in no acute distress; alert,appropriate and cooperative throughout examination HEENT: No facial asymmetry,  EOMI, No sinus tenderness, TM's Clear, oropharynx  pink and moist.   Chest: Clear to auscultation bilaterally.  CVS: S1, S2, No murmurs, No S3.   Abd: Soft, Nontender.  MS: Adequate ROM spine, hips, shoulders and knees.  Ext: No edema.   CNS: CN 2-12 intact, power tone and sensation normal throughout.   Skin: Intact, no visible lesions or rashes.  Psych: Good eye contact, normal affect.  Memory intact, not anxious or depressed appearing.   Diabetes Management Exam:    Foot Exam (with socks  and/or shoes not present):       Sensory-Monofilament:          Left foot: normal          Right foot: normal       Inspection:          Left foot: normal          Right foot: normal       Nails:          Left foot: normal          Right foot: normal   Impression & Recommendations:  Problem # 1:  HYPERTENSION (ICD-401.9) Assessment Unchanged  Her updated medication list for this problem includes:    Lisinopril 20 Mg Tabs (Lisinopril) ..... One tab by mouth once daily  Orders: T-Basic Metabolic Panel [id number])  BP today:  124/76 Prior BP: 120/70 (01/04/2010)  Labs Reviewed: K+: 4.4 (01/04/2010) Creat: : 1.18 (01/04/2010)   Chol: 163 (01/04/2010)   HDL: 47 (01/04/2010)   LDL: 100 (01/04/2010)   TG: 78 (01/04/2010)  Problem # 2:  DIABETES MELLITUS, TYPE II (ICD-250.00) Assessment: Improved  Her updated medication list for this problem includes:    Aspirin 81 Mg Tbec (Aspirin) .Marland Kitchen... Take 1 tablet by mouth once a day    Glipizide 10 Mg Tabs (Glipizide) .Marland Kitchen... Take 1 tablet by mouth two times a day    Metformin Hcl 1000 Mg Tabs (Metformin hcl) .Marland Kitchen... Take 1 tablet by mouth two times a day    Lisinopril 20 Mg Tabs (Lisinopril) ..... One tab by mouth once daily  Orders: T- Hemoglobin A1C ([id number])  Labs Reviewed: Creat: 1.18 (01/04/2010)    Reviewed HgBA1c results: 7.5 (01/04/2010)  7.8 (09/14/2009)  Problem # 3:  OBESITY (ICD-278.00) Assessment: Unchanged  Ht: 66 (02/13/2010)   Wt: 244.75 (02/13/2010)   BMI: 39.65 (02/13/2010) therapeutic lifestyle change discussed and encouraged  Complete Medication List: 1)  Aspirin 81 Mg Tbec (Aspirin) .... Take 1 tablet by mouth once a day 2)  Glipizide 10 Mg Tabs (Glipizide) .... Take 1 tablet by mouth two times a day 3)  Metformin Hcl 1000 Mg Tabs (Metformin hcl) .... Take 1 tablet by mouth two times a day 4)  Lisinopril 20 Mg Tabs (Lisinopril) .... One tab by mouth once daily 5)  Vitamin D (ergocalciferol) 50000 Unit Caps (Ergocalciferol) .... One cap by mouth every week 6)  Multivitamins Tabs (Multiple vitamin) .... One tab by mouth once daily 7)  Citracal Petites/vitamin D 200-250 Mg-unit Tabs (Calcium citrate-vitamin d) .... One tab by mouth once daily 8)  Lovastatin 20 Mg Tabs (Lovastatin) .... Four tabs by mouth at bedtime 9)  Freestyle Lite Test Strp (Glucose blood) .... Twice daily testing dx: 250.00 uncontrolled  Patient Instructions: 1)  F/U mid December, 2)  Pls test twice daily, sugar should never be less than 70 or more than 200. 3)  Pls try  and eat the same amt of carbohydrate in each meal, it is extremely impt for your health that you start regular physical activity for evem 15 mins every day 4)  No med changes 5)  BMP prior to visit, ICD-9: 6)  HbgA1C prior to visit, ICD-9  non fasting in md Dec 7)  The medication list was reviewed and reconciled..All changed/newly prescribed medications were explained. A complete medication list was provided to the patient/caregiver.  Prescriptions: FREESTYLE LITE TEST  STRP (GLUCOSE BLOOD) twice daily testing dx: 250.00 uncontrolled  #100 x 3   Entered by:  Adella Hare LPN   Authorized by:   Syliva Overman MD   Signed by:   Adella Hare LPN on 84/69/6295   Method used:   Electronically to        [facility]  [address] (retail)       [address]       [place], [place]  [address]       Ph: [phone]       Fax: [phone]   RxID:   [id number] VITAMIN D (ERGOCALCIFEROL) 50000 UNIT CAPS (ERGOCALCIFEROL) one cap by mouth every week  #4 x 3   Entered by:   Adella Hare LPN   Authorized by:   Syliva Overman MD   Signed by:   Adella Hare LPN on 18/84/1660   Method used:   Electronically to        [facility]  [address] (retail)       [address]       [place], [place]  [address]       Ph: [phone]       Fax: [phone]   RxID:   [id number] METFORMIN HCL 1000 MG  TABS (METFORMIN HCL) Take 1 tablet by mouth two times a day  #180 Tablet x 1   Entered by:   Adella Hare LPN   Authorized by:   Syliva Overman MD   Signed by:   Adella Hare LPN on 73/71/0626   Method used:   Electronically to        [facility]  [address] (retail)       [address]       [place], [place]  [address]       Ph: [phone]       Fax: [phone]   RxID:   [id number] LISINOPRIL 20 MG  TABS (LISINOPRIL) one tab by mouth once daily  #30 x 3   Entered by:   Adella Hare LPN   Authorized by:   Syliva Overman MD   Signed by:    Adella Hare LPN on 85/27/7824   Method used:   Electronically to        [facility]  [address] (retail)       [address]       [place], [place]  [address]       Ph: [phone]       Fax: [phone]   RxID:   [id number] FREESTYLE LITE TEST  STRP (GLUCOSE BLOOD) once daily testing dx: 250.00  #100 x 3   Entered by:   Adella Hare LPN   Authorized by:   Syliva Overman MD   Signed by:   Adella Hare LPN on 05/39/7673   Method used:   Electronically to        [facility]  [address] (retail)       [address]       [place], [place]  [address]       Ph: [phone]       Fax: [phone]   RxID:   [id number]    Orders Added: 1)  Est. Patient Level III [17408] 2)  T-Basic Metabolic Panel [80048-22910] 3)  T- Hemoglobin A1C [83036-23375]

## 2010-05-31 NOTE — Progress Notes (Signed)
Summary: sugar  Phone Note Call from Patient   Summary of Call: call her back about her sugar at [phone] Initial call taken by: Lind Guest,  Sep 19, 2009 1:48 PM  Follow-up for Phone Call        STATES HER SUGARS RAN 70,54,56 TODAY ADVISED TO HOLD COMBIGLYZA TONIGHT   PATIENT WAS TAKING METFORMIN AS WELL ADVISED PATIENT NEW MED IS A COMBINATION DRUG NOT TO TAKE METFORMIN ANYMORE Follow-up by: Adella Hare LPN,  Sep 19, 2009 5:12 PM

## 2010-05-31 NOTE — Letter (Signed)
Summary: demographic  demographic   Imported By: Curtis Sites 10/17/2009 14:39:56  _____________________________________________________________________  External Attachment:    Type:   Image     Comment:   External Document

## 2010-05-31 NOTE — Letter (Signed)
Summary: progress notes  progress notes   Imported By: Curtis Sites 10/17/2009 14:41:25  _____________________________________________________________________  External Attachment:    Type:   Image     Comment:   External Document

## 2010-05-31 NOTE — Progress Notes (Signed)
Summary: rx  Phone Note Call from Patient   Summary of Call: would like to get rx faxed over to her pharm. she states they have sent request 3times. [phone] Initial call taken by: Rudene Anda,  July 31, 2009 2:54 PM  Follow-up for Phone Call        called patient. sent it in yesterday Follow-up by: Everitt Amber LPN,  August 01, 2009 2:35 PM

## 2010-05-31 NOTE — Medication Information (Signed)
Summary: Tax adviser   Imported By: Lind Guest 04/17/2010 14:16:30  _____________________________________________________________________  External Attachment:    Type:   Image     Comment:   External Document

## 2010-05-31 NOTE — Letter (Signed)
Summary: history and physcial  history and physcial   Imported By: Curtis Sites 10/17/2009 14:42:43  _____________________________________________________________________  External Attachment:    Type:   Image     Comment:   External Document

## 2010-05-31 NOTE — Letter (Signed)
Summary: Recall, Screening Colonoscopy Only  [facility]  [address]   [place], [place] [address]   Phone: [phone]  Fax: [phone]    February 22, 2010  Helen Mahoney [address] [place], [place]  [address] 08/27/1950   Dear Ms. Corine Shelter,   Our records indicate it is time to schedule your colonoscopy.    Please call our office at [phone] and ask for the nurse.   Thank you,    Hendricks Limes, LPN Cloria Spring, LPN  [facility] Ph: [phone]   Fax: [phone]

## 2010-05-31 NOTE — Letter (Signed)
Summary: consults  consults   Imported By: Curtis Sites 10/17/2009 14:39:38  _____________________________________________________________________  External Attachment:    Type:   Image     Comment:   External Document

## 2010-05-31 NOTE — Letter (Signed)
Summary: xray  xray   Imported By: Curtis Sites 10/17/2009 14:41:40  _____________________________________________________________________  External Attachment:    Type:   Image     Comment:   External Document

## 2010-05-31 NOTE — Assessment & Plan Note (Signed)
Summary: office visit   Vital Signs:  Patient profile:   60 year old female Menstrual status:  postmenopausal Height:      66 inches Weight:      247 pounds BMI:     40.01 O2 Sat:      96 % Pulse rate:   89 / minute Pulse rhythm:   regular Resp:     16 per minute BP sitting:   118 / 80  Vitals Entered By: Everitt Amber (June 01, 2009 11:20 AM)  Nutrition Counseling: Patient's BMI is greater than 25 and therefore counseled on weight management options. CC: having some painin her right shoulder Pain Assessment Patient in pain? yes     Location: right shoulder Intensity: 7 Type: aching Onset of pain  a week   CC:  having some painin her right shoulder.  History of Present Illness: Reports  that she has been  doing well. Denies recent fever or chills. Denies sinus pressure, nasal congestion , ear pain or sore throat. Denies chest congestion, or cough productive of sputum. Denies chest pain, palpitations, PND, orthopnea or leg swelling. Denies abdominal pain, nausea, vomitting, diarrhea or constipation. Denies change in bowel movements or bloody stool. Denies dysuria , frequency, incontinence or hesitancy. Denies  joint pain, swelling, or reduced mobility. Denies headaches, vertigo, seizures. Denies depression, anxiety or insomnia. Denies  rash, lesions, or itch. She is still not exercising, reports overindulging on sweets with weight gain and poor blood sugar control. She tests infrequently, states hjer ins will not pay for supplies, sh actually tests only when she thinks the readings are high      Allergies: 1)  ! Penicillin 2)  ! * Tetanus Vaccine  Review of Systems      See HPI MS:  Complains of joint pain; right post shoulder pain x 3 days, has had this in the past , bursitis. Endo:  See HPI. Heme:  Denies abnormal bruising and bleeding. Allergy:  Denies hives or rash and itching eyes.  Physical Exam  General:  alert, well-hydrated, and  overweight-appearing.  HEENT: No facial asymmetry,  EOMI, No sinus tenderness, TM's Clear, oropharynx  pink and moist.   Chest: Clear to auscultation bilaterally.  CVS: S1, S2, No murmurs, No S3.   Abd: Soft, Nontender.  MS: Adequate ROM spine, hips, shoulders and knees.  Ext: No edema.   CNS: CN 2-12 intact, power tone and sensation normal throughout.   Skin: Intact, no visible lesions or rashes.  Psych: Good eye contact, normal affect.  Memory intact, not anxious or depressed appearing.     Impression & Recommendations:  Problem # 1:  DIABETES MELLITUS, TYPE II (ICD-250.00) Assessment Deteriorated  Her updated medication list for this problem includes:    Aspirin 81 Mg Tbec (Aspirin) .Marland Kitchen... Take 1 tablet by mouth once a day    Glipizide 10 Mg Tabs (Glipizide) .Marland Kitchen... Take 1 tablet by mouth two times a day    Metformin Hcl 1000 Mg Tabs (Metformin hcl) .Marland Kitchen... Take 1 tablet by mouth two times a day    Lisinopril 20 Mg Tabs (Lisinopril) .Marland Kitchen... Take one half tablet by mouth daily  Orders: Glucose, (CBG) (82962) Hemoglobin A1C (83036)  Labs Reviewed: Creat: 1.02 (01/20/2009)    Reviewed HgBA1c results: 7.9 (06/01/2009)  6.8 (01/20/2009)  Problem # 2:  OBESITY (ICD-278.00) Assessment: Deteriorated  Ht: 66 (06/01/2009)   Wt: 247 (06/01/2009)   BMI: 40.01 (06/01/2009)  Problem # 3:  HYPERTENSION (ICD-401.9) Assessment: Unchanged  Her updated medication list for this problem includes:    Lisinopril 20 Mg Tabs (Lisinopril) .Marland Kitchen... Take one half tablet by mouth daily  BP today: 118/80 Prior BP: 108/70 (01/20/2009)  Labs Reviewed: K+: 4.6 (01/20/2009) Creat: : 1.02 (01/20/2009)   Chol: 148 (01/20/2009)   HDL: 43 (01/20/2009)   LDL: 87 (01/20/2009)   TG: 91 (01/20/2009)  Problem # 4:  HYPERLIPIDEMIA (ICD-272.4) Assessment: Comment Only  Her updated medication list for this problem includes:    Mevacor 40 Mg Tabs (Lovastatin) .Marland Kitchen... 2 tablets at bedtime  Orders: T-Hepatic  Function [phone]) T-Lipid Profile [phone])  Labs Reviewed: SGOT: 19 (01/20/2009)   SGPT: 18 (01/20/2009)   HDL:43 (01/20/2009), 46 (09/21/2008)  LDL:87 (01/20/2009), 90 (09/21/2008)  Chol:148 (01/20/2009), 156 (09/21/2008)  Trig:91 (01/20/2009), 102 (09/21/2008)  Complete Medication List: 1)  Aspirin 81 Mg Tbec (Aspirin) .... Take 1 tablet by mouth once a day 2)  Glipizide 10 Mg Tabs (Glipizide) .... Take 1 tablet by mouth two times a day 3)  Metformin Hcl 1000 Mg Tabs (Metformin hcl) .... Take 1 tablet by mouth two times a day 4)  Lisinopril 20 Mg Tabs (Lisinopril) .... Take one half tablet by mouth daily 5)  Oscal 500/200 D-3 500-200 Mg-unit Tabs (Calcium-vitamin d) .... Take 1 tablet by mouth once a day 6)  One-a-day Weight Smart Advance Tabs (Multiple vitamins-minerals) .... Take 1 tablet by mouth once a day 7)  Mevacor 40 Mg Tabs (Lovastatin) .... 2 tablets at bedtime  Other Orders: T-CBC w/Diff ([id number]) T-TSH [id number]) T-Anemia Panel 3  (2904)  Patient Instructions: 1)  Please schedule a follow-up appointment in 3 months. 2)  It is important that you exercise regularly at least 20 minutes 5 times a week. If you develop chest pain, have severe difficulty breathing, or feel very tired , stop exercising immediately and seek medical attention. 3)  You need to lose weight. Consider a lower calorie diet and regular exercise. pls lose 10 pounds in the next 3  months 4)  Hepatic Panel prior to visit, ICD-9: 5)  Lipid Panel prior to visit, ICD-9:  fasting  week of Feb 14 6)  TSH prior to visit, ICD-9: 7)  CBC w/ Diff prior to visit, ICD-9: 8)  anemia PANEL 9)  YOUR BLOOD SUGAR IS NOT AS GOOD AS IT WAS, YOU ABSOLUTELLY Need TO CHANGE DIET AND START [facility]  Laboratory Results   Blood Tests   Date/Time Received: June 01, 2009  Date/Time Reported: June 01, 2009   Glucose (random): 180 mg/dL   (Normal Range: 44-010) HGBA1C: 7.9%   (Normal Range:  Non-Diabetic - 3-6%   Control Diabetic - 6-8%)

## 2010-05-31 NOTE — Letter (Signed)
Summary: misc.  misc.   Imported By: Curtis Sites 10/17/2009 14:40:47  _____________________________________________________________________  External Attachment:    Type:   Image     Comment:   External Document

## 2010-06-14 ENCOUNTER — Telehealth: Payer: Self-pay | Admitting: Family Medicine

## 2010-06-20 NOTE — Progress Notes (Signed)
Summary: sisters condition  Phone Note Call from Patient   Summary of Call: came by to pick up Reathys papers and said she was over there in handcuffs, and she will stay all night. If you need to talk to her just call her. Initial call taken by: Lind Guest,  June 14, 2010 3:20 PM  Follow-up for Phone Call        I am aware she is at the hospital I called the Ed, no need or me to call at this time Follow-up by: Syliva Overman MD,  June 14, 2010 5:20 PM

## 2010-07-14 ENCOUNTER — Other Ambulatory Visit: Payer: Self-pay | Admitting: Family Medicine

## 2010-07-15 LAB — CONVERTED CEMR LAB
ALT: 14 units/L (ref 0–35)
Albumin: 3.8 g/dL (ref 3.5–5.2)
Alkaline Phosphatase: 108 units/L (ref 39–117)
CO2: 26 meq/L (ref 19–32)
Cholesterol: 155 mg/dL (ref 0–200)
Glucose, Bld: 144 mg/dL — ABNORMAL HIGH (ref 70–99)
LDL Cholesterol: 94 mg/dL (ref 0–99)
Potassium: 4.7 meq/L (ref 3.5–5.3)
Sodium: 137 meq/L (ref 135–145)
Total Bilirubin: 0.4 mg/dL (ref 0.3–1.2)
Total Protein: 6.8 g/dL (ref 6.0–8.3)
Triglycerides: 66 mg/dL (ref <150)
VLDL: 13 mg/dL (ref 0–40)

## 2010-07-16 ENCOUNTER — Encounter: Payer: Self-pay | Admitting: Family Medicine

## 2010-07-17 ENCOUNTER — Encounter: Payer: Self-pay | Admitting: Family Medicine

## 2010-07-17 ENCOUNTER — Ambulatory Visit (INDEPENDENT_AMBULATORY_CARE_PROVIDER_SITE_OTHER): Payer: BC Managed Care – PPO | Admitting: Family Medicine

## 2010-07-17 VITALS — BP 132/82 | HR 72 | Resp 16 | Ht 66.0 in | Wt 246.8 lb

## 2010-07-17 DIAGNOSIS — I10 Essential (primary) hypertension: Secondary | ICD-10-CM

## 2010-07-17 DIAGNOSIS — R5383 Other fatigue: Secondary | ICD-10-CM

## 2010-07-17 DIAGNOSIS — E669 Obesity, unspecified: Secondary | ICD-10-CM

## 2010-07-17 DIAGNOSIS — E785 Hyperlipidemia, unspecified: Secondary | ICD-10-CM

## 2010-07-17 DIAGNOSIS — E559 Vitamin D deficiency, unspecified: Secondary | ICD-10-CM

## 2010-07-17 DIAGNOSIS — E1165 Type 2 diabetes mellitus with hyperglycemia: Secondary | ICD-10-CM

## 2010-07-17 LAB — VITAMIN D 25 HYDROXY (VIT D DEFICIENCY, FRACTURES): Vit D, 25-Hydroxy: 56 ng/mL (ref 30–89)

## 2010-07-17 MED ORDER — INSULIN DETEMIR 100 UNIT/ML ~~LOC~~ SOLN: 15.0000 [IU] | Freq: Every day | SUBCUTANEOUS | Status: DC

## 2010-07-17 MED ORDER — LISINOPRIL 20 MG PO TABS: 20.0000 mg | ORAL_TABLET | Freq: Every day | ORAL | Status: DC

## 2010-07-17 MED ORDER — SITAGLIPTIN PHOSPHATE 100 MG PO TABS: 100.0000 mg | ORAL_TABLET | Freq: Every day | ORAL | Status: DC

## 2010-07-17 MED ORDER — METFORMIN HCL 1000 MG PO TABS: 1000.0000 mg | ORAL_TABLET | Freq: Two times a day (BID) | ORAL | Status: DC

## 2010-07-17 NOTE — Progress Notes (Signed)
  Subjective:    Patient ID: Helen Mahoney, female    DOB: 01-27-1951, 60 y.o.   MRN: [id number]  HPI Patient reports she has been fairly well. She has been under significant stress however , due to the ill health of several family members. As a result, she has not been as compliant with diet and exercise, and her blood sugars have been high .Denies recent fever or chills. Denies sinus pressure, nasal congestion, ear pain or sore throat. Denies chest congestion, productive cough or wheezing. Denies chest pains, palpitations, paroxysmal nocturnal dyspnea, orthopnea and leg swelling Denies abdominal pain, nausea, vomiting,diarrhea or constipation.  Denies rectal bleeding or change in bowel movement. Denies dysuria, frequency, hesitancy or incontinence. Denies joint pain, swelling and limitation and mobility. Denies headaches, seizure, numbness, or tingling. Denies depression, anxiety or insomnia. Denies skin break down or rash.       Review of Systems  Eyes: Negative for pain, discharge, redness and itching.  Psychiatric/Behavioral: Negative for behavioral problems and confusion.       Objective:   Physical Exam Patient alert and oriented and in no Cardiopulmonary distress.  HEENT: No facial asymmetry, EOMI, no sinus tenderness, TM's clear, Oropharynx pink and moist.  Neck supple no adenopathy.  Chest: Clear to auscultation bilaterally.  CVS: S1, S2 no murmurs, no S3.  ABD: Soft non tender. Bowel sounds normal.  Ext: No edema  MS: Adequate ROM spine, shoulders, hips and knees.  Skin: Intact, no ulcerations or rash noted.  Psych: Good eye contact, normal affect. Memory intact not anxious or depressed appearing.  CNS: CN 2-12 intact, power, tone and sensation normal throughout.        Assessment & Plan:  Hypertension: sub optimal control, pt to follow the DASH diet, exercise regularly and aim to lose weight. No med changes at this time. Type 2 diabetes:  deteriorated,medication added, and dose increased on metformin Obesity: unchanged Hyperlipidemia:LDL above goal, of 70 or less,Low fat diet discussed and encouraged.

## 2010-07-17 NOTE — Progress Notes (Signed)
januvia 100mg  samples given Lot [id number] Exp 09/14

## 2010-07-17 NOTE — Patient Instructions (Signed)
F/u in 3 months.  Med changes as discussed and highlighted.  Your diabetes is worse. You will inc the dose of metformin and add levimir.  Fasting blood sugar goal is 80-120 and 2 hours after any meal or at bedtime the blood sugar should range between 130-170.   Blood pressure and cholesterol are fairly good ,but also need improvement.  Labs prior to next visit.

## 2010-07-18 ENCOUNTER — Other Ambulatory Visit: Payer: Self-pay | Admitting: Family Medicine

## 2010-07-18 ENCOUNTER — Encounter: Payer: Self-pay | Admitting: *Deleted

## 2010-07-18 ENCOUNTER — Other Ambulatory Visit: Payer: Self-pay | Admitting: *Deleted

## 2010-07-18 ENCOUNTER — Telehealth: Payer: Self-pay | Admitting: Family Medicine

## 2010-07-18 ENCOUNTER — Telehealth: Payer: Self-pay | Admitting: *Deleted

## 2010-07-18 DIAGNOSIS — E119 Type 2 diabetes mellitus without complications: Secondary | ICD-10-CM

## 2010-07-18 MED ORDER — INSULIN PEN NEEDLE 31G X 8 MM MISC: Status: DC

## 2010-07-18 NOTE — Telephone Encounter (Signed)
Patient need pen needles for insulin sent in. Script sent

## 2010-08-02 ENCOUNTER — Telehealth: Payer: Self-pay | Admitting: Family Medicine

## 2010-08-02 NOTE — Telephone Encounter (Signed)
pls advisereduce metformion to one daily

## 2010-08-02 NOTE — Telephone Encounter (Signed)
Called patient left message

## 2010-08-02 NOTE — Telephone Encounter (Signed)
Since she started the Januvia, her fasting sugars have been 56, 61, 72... She thinks something needs to be reduced

## 2010-08-07 NOTE — Telephone Encounter (Signed)
Patient aware.

## 2010-08-16 ENCOUNTER — Other Ambulatory Visit: Payer: Self-pay | Admitting: Family Medicine

## 2010-08-20 ENCOUNTER — Other Ambulatory Visit: Payer: Self-pay | Admitting: Family Medicine

## 2010-09-11 NOTE — H&P (Signed)
NAME:  GEORGEANNE, FRANKLAND NO.:  [id number]   MEDICAL RECORD NO.:  [id number]          PATIENT TYPE:  AMB   LOCATION:  DAY                           FACILITY:  [facility]   PHYSICIAN:  Dalia Heading, M.D.  DATE OF BIRTH:  Jul 25, 1950   DATE OF ADMISSION:  DATE OF DISCHARGE:  LH                              HISTORY & PHYSICAL   CHIEF COMPLAINT:  Anemia, history of colon polyp.   HISTORY OF PRESENT ILLNESS:  The patient is a 60 year old black female  who was referred for an endoscopic evaluation.  She needs EGD and  colonoscopy for workup of anemia of unknown etiology.  She has a  colonoscopy 2 years ago by Dr. Katrinka Blazing which showed a polyp.  No abdominal  pain, weight loss, nausea, vomiting, diarrhea, constipation, melena,  hematochezia have been noted.  She denies any family history of colon  carcinoma.   PAST MEDICAL HISTORY:  Includes non-insulin-dependent mellitus,  hypertension.   PAST SURGICAL HISTORY:  Cholecystectomy.   CURRENT MEDICATIONS:  Avandia, glipizide, metformin, Januvia,  lisinopril.   ALLERGIES:  PENICILLIN.   REVIEW OF SYSTEMS:  Noncontributory.   PHYSICAL EXAMINATION:  GENERAL:  The patient is a well-developed, well-  nourished black female in no acute distress.  LUNGS:  Clear to auscultation with good breath sounds bilaterally.  HEART:  Reveals a regular rate and rhythm without S3, S4, or murmurs.  ABDOMEN:  Soft, nontender, nondistended.  No hepatosplenomegaly or  masses are noted.  RECTAL:  Deferred for the procedure.   IMPRESSION:  Anemia, colon polyp.   PLAN:  The patient is scheduled for an EGD and colonoscopy on March 16, 2007 for the risks and benefits of the procedure including bleeding  and perforation were fully explained to the patient, gave her informed  consent.      Dalia Heading, M.D.  Electronically Signed     MAJ/MEDQ  D:  03/12/2007  T:  03/13/2007  Job:  [id number]   cc:   Milus Mallick. Lodema Hong, M.D.  Fax:  [phone]   Short Stay at [facility]

## 2010-09-14 NOTE — H&P (Signed)
NAME:  Helen Mahoney, DAQUILA          ACCOUNT NO.:  [id number]   MEDICAL RECORD NO.:  [id number]          PATIENT TYPE:  AMB   LOCATION:  DAY                           FACILITY:  [facility]   PHYSICIAN:  Jerolyn Shin C. Katrinka Blazing, M.D.   DATE OF BIRTH:  Aug 30, 1950   DATE OF ADMISSION:  DATE OF DISCHARGE:  LH                                HISTORY & PHYSICAL   HISTORY OF PRESENT ILLNESS:  A 60 year old female referred for colonoscopy.  The patient's sister had a large villous tumor.  The patient denies  abdominal pain, constipation, or rectal bleeding.   PAST HISTORY:  1.  Hypertension.  2.  Diabetes.  3.  Obesity.   SURGERY:  Cholecystectomy.   MEDICATIONS:  1.  Benicar 20 mg daily.  2.  Aspirin 81 mg daily.  3.  Zocor 80 mg daily.  4.  Glipizide-metformin 5/500 three times daily.  5.  Calcium plus vitamin D three times daily.   SOCIAL HISTORY:  The patient is married.  Unemployed.  She does not drink,  smoke, or use drugs.   PHYSICAL EXAMINATION:  VITAL SIGNS:  Blood pressure 124/86, pulse 88,  respirations 20, weight 286 pounds.  HEENT:  Unremarkable.  NECK:  Supple.  No JVD, bruit, adenopathy, or thyromegaly.  CHEST:  Clear to auscultation.  HEART:  Regular rate and rhythm without murmur, gallop, or rub.  ABDOMEN:  Soft, nontender.  No masses.  EXTREMITIES:  No clubbing, cyanosis, or edema.  NEUROLOGIC:  No focal motor, sensory, or cerebellar deficit.   IMPRESSION:  1.  Need for colonoscopy.  2.  Family history of colon polyps.  3.  Hypertension.  4.  Diabetes mellitus.   PLAN:  Total colonoscopy.      [name]. Katrinka Blazing, M.D.  Electronically Signed     LCS/MEDQ  D:  03/27/2005  T:  03/28/2005  Job:  [id number]

## 2010-09-14 NOTE — Op Note (Signed)
[place]  Patient:    Helen Mahoney, Helen Mahoney                 MRN: [id number] Proc. Date: 02/21/00 Adm. Date:  [id number] Attending:  Abigail Miyamoto A                           Operative Report  PREOPERATIVE DIAGNOSIS:  Symptomatic cholelithiasis.  POSTOPERATIVE DIAGNOSIS:  Symptomatic cholelithiasis.  OPERATION PERFORMED:  Laparoscopic cholecystectomy.  SURGEON:  Abigail Miyamoto, M.D.  ASSISTANT:  Anselm Pancoast. Zachery Dakins, M.D.  ANESTHESIA:  General endotracheal anesthesia.  ESTIMATED BLOOD LOSS:  Minimal.  DESCRIPTION OF PROCEDURE:  Patient brought to operating room and identified as Helen Mahoney.  She was placed supine on the operating table and general anesthesia was induced.  Her abdomen was then prepped and draped in the usual sterile fashion.  Using a #15 blade, a small transverse incision was made below the umbilicus.  The incision was carried down to the fascia.  The fascia was then opened with a scalpel.  A hemostat was then used to pass into the peritoneal cavity.  Next, a 0 Vicryl pursestring suture was placed around the fascial opening.  The Hasson port was then placed through the opening and insufflation of the abdomen was begun.  Next, an 11 mm port was placed in the patients epigastrium and two 5 mm ports were placed in the patients right flank under direct vision.  The gallbladder was identified and grasped and retracted above the liver bed.  Several adhesions to the gallbladder were taken down.  Dissection was then carried out at the base of the gallbladder. Once this was dissected out, the cystic duct was identified and clipped twice proximally and once distally and transected with the scissors.  The cystic artery was then identified and clipped twice proximally and once distally and transected as well.  The gallbladder was then completely dissected free from the liver bed with electrocautery.  A separate posterior  branch of the cystic artery was clipped as well.  Once the gallbladder was completely removed, hemostasis was achieved in the liver bed.  The gallbladder was then placed in an Endosack and removed through the port at the umbilicus.  The 0 Vicryl suture at the umbilicus was then tied in place.  The abdomen was then copiously irrigated with normal saline.  All ports were then removed under direct vision and the abdomen was deflated.  All skin incisions were then anesthetized with 0.25% Marcaine and closed with 4-0 subcuticular Vicryl sutures.  Steri-Strips, gauze and tape were then applied.  The patient tolerated the procedure well. All needle and instrument counts were correct at the end of the procedure.  The patient was then extubated in the operating room and taken in stable condition to the recovery room. DD:  02/21/00 TD:  02/21/00 Job: [id number]

## 2010-10-10 ENCOUNTER — Encounter: Payer: Self-pay | Admitting: Family Medicine

## 2010-10-12 ENCOUNTER — Encounter: Payer: Self-pay | Admitting: Family Medicine

## 2010-10-15 ENCOUNTER — Encounter: Payer: Self-pay | Admitting: Family Medicine

## 2010-10-15 ENCOUNTER — Other Ambulatory Visit: Payer: Self-pay | Admitting: Family Medicine

## 2010-10-15 ENCOUNTER — Telehealth: Payer: Self-pay | Admitting: Family Medicine

## 2010-10-15 NOTE — Telephone Encounter (Signed)
Sent to lab

## 2010-10-16 ENCOUNTER — Encounter: Payer: Self-pay | Admitting: Family Medicine

## 2010-10-16 ENCOUNTER — Ambulatory Visit (INDEPENDENT_AMBULATORY_CARE_PROVIDER_SITE_OTHER): Payer: BC Managed Care – PPO | Admitting: Family Medicine

## 2010-10-16 VITALS — BP 100/70 | HR 92 | Resp 16 | Ht 66.0 in | Wt 250.0 lb

## 2010-10-16 DIAGNOSIS — Z23 Encounter for immunization: Secondary | ICD-10-CM

## 2010-10-16 DIAGNOSIS — E785 Hyperlipidemia, unspecified: Secondary | ICD-10-CM

## 2010-10-16 DIAGNOSIS — I10 Essential (primary) hypertension: Secondary | ICD-10-CM

## 2010-10-16 DIAGNOSIS — E119 Type 2 diabetes mellitus without complications: Secondary | ICD-10-CM

## 2010-10-16 DIAGNOSIS — Z2911 Encounter for prophylactic immunotherapy for respiratory syncytial virus (RSV): Secondary | ICD-10-CM

## 2010-10-16 DIAGNOSIS — E1165 Type 2 diabetes mellitus with hyperglycemia: Secondary | ICD-10-CM

## 2010-10-16 DIAGNOSIS — E669 Obesity, unspecified: Secondary | ICD-10-CM

## 2010-10-16 LAB — DIFFERENTIAL
Basophils Absolute: 0 10*3/uL (ref 0.0–0.1)
Basophils Relative: 1 % (ref 0–1)
Eosinophils Absolute: 0.3 10*3/uL (ref 0.0–0.7)
Eosinophils Relative: 4 % (ref 0–5)
Lymphocytes Relative: 25 % (ref 12–46)
Lymphs Abs: 1.7 10*3/uL (ref 0.7–4.0)
Monocytes Absolute: 0.4 10*3/uL (ref 0.1–1.0)
Monocytes Relative: 6 % (ref 3–12)
Neutro Abs: 4.4 10*3/uL (ref 1.7–7.7)
Neutrophils Relative %: 65 % (ref 43–77)

## 2010-10-16 LAB — COMPLETE METABOLIC PANEL WITH GFR
ALT: 14 U/L (ref 0–35)
AST: 17 U/L (ref 0–37)
Albumin: 3.6 g/dL (ref 3.5–5.2)
BUN: 17 mg/dL (ref 6–23)
Calcium: 9.4 mg/dL (ref 8.4–10.5)
Chloride: 105 mEq/L (ref 96–112)
Potassium: 4.5 mEq/L (ref 3.5–5.3)

## 2010-10-16 LAB — TSH: TSH: 2.212 u[IU]/mL (ref 0.350–4.500)

## 2010-10-16 MED ORDER — METFORMIN HCL 1000 MG PO TABS: 1000.0000 mg | ORAL_TABLET | Freq: Two times a day (BID) | ORAL | Status: DC

## 2010-10-16 MED ORDER — LISINOPRIL 20 MG PO TABS: 20.0000 mg | ORAL_TABLET | Freq: Every day | ORAL | Status: DC

## 2010-10-16 MED ORDER — PRAVASTATIN SODIUM 80 MG PO TABS: 80.0000 mg | ORAL_TABLET | Freq: Every evening | ORAL | Status: DC

## 2010-10-16 NOTE — Patient Instructions (Signed)
cPE in 3.5 months.  Fasting labs in 3.5 months, lipid, chem 7 and hBA1c and cbc  zostavax today  pls schedule your colonscopy , this is very important, also mammogram will be due in September, we will schedule  congrats sugar is much better.  It is important that you exercise regularly at least 30 minutes 5 times a week. If you develop chest pain, have severe difficulty breathing, or feel very tired, stop exercising immediately and seek medical attention  A healthy diet is rich in fruit, vegetables and whole grains. Poultry fish, nuts and beans are a healthy choice for protein rather then red meat. A low sodium diet and drinking 64 ounces of water daily is generally recommended. Oils and sweet should be limited. Carbohydrates especially for those who are diabetic or overweight, should be limited to 34-45 gram per meal. It is important to eat on a regular schedule, at least 3 times daily. Snacks should be primarily fruits, vegetables or nuts.

## 2010-10-30 NOTE — Assessment & Plan Note (Signed)
Improved, no med change 

## 2010-10-30 NOTE — Assessment & Plan Note (Signed)
Controlled, no change in medication  

## 2010-10-30 NOTE — Assessment & Plan Note (Signed)
Unchanged, lifestyle change discussed  And encouraged to facilitate weight loss

## 2010-10-30 NOTE — Progress Notes (Signed)
  Subjective:    Patient ID: Helen Mahoney, female    DOB: 12/04/1950, 60 y.o.   MRN: [id number]  HPI HYPERTENSION Disease Monitoring Blood pressure range-unknown Chest pain- no      Dyspnea- no Medications Compliance- good Lightheadedness- good   Edema- no   DIABETES Disease Monitoring Blood Sugar ranges-90 to 120 Polyuria- no New Visual problems- no Medications Compliance- good Hypoglycemic symptoms- no   HYPERLIPIDEMIA Disease Monitoring See symptoms for Hypertension Medications Compliance- good RUQ pain- no  Muscle aches- no      Review of Systems Denies recent fever or chills. Denies sinus pressure, nasal congestion, ear pain or sore throat. Denies chest congestion, productive cough or wheezing. Denies chest pains, palpitations, paroxysmal nocturnal dyspnea, orthopnea and leg swelling Denies abdominal pain, nausea, vomiting,diarrhea or constipation.  Denies rectal bleeding or change in bowel movement. Denies dysuria, frequency, hesitancy or incontinence. Denies joint pain, swelling and limitation in mobility. Denies headaches, seizure, numbness, or tingling. Denies depression, anxiety or insomnia. Denies skin break down or rash.        Objective:   Physical Exam Patient alert and oriented and in no Cardiopulmonary distress.  HEENT: No facial asymmetry, EOMI, no sinus tenderness, TM's clear, Oropharynx pink and moist.  Neck supple no adenopathy.  Chest: Clear to auscultation bilaterally.  CVS: S1, S2 no murmurs, no S3.  ABD: Soft non tender. Bowel sounds normal.  Ext: No edema  MS: Adequate ROM spine, shoulders, hips and knees.  Skin: Intact, no ulcerations or rash noted.  Psych: Good eye contact, normal affect. Memory intact not anxious or depressed appearing.  CNS: CN 2-12 intact, power, tone and sensation normal throughout.        Assessment & Plan:

## 2010-11-23 ENCOUNTER — Other Ambulatory Visit: Payer: Self-pay

## 2010-11-23 ENCOUNTER — Telehealth: Payer: Self-pay | Admitting: Family Medicine

## 2010-11-23 DIAGNOSIS — E785 Hyperlipidemia, unspecified: Secondary | ICD-10-CM

## 2010-11-23 MED ORDER — PRAVASTATIN SODIUM 80 MG PO TABS: 80.0000 mg | ORAL_TABLET | Freq: Every evening | ORAL | Status: DC

## 2010-11-23 NOTE — Telephone Encounter (Signed)
It was changed to pravastatin 80mg . Sent in to [facility]

## 2011-01-02 ENCOUNTER — Other Ambulatory Visit: Payer: Self-pay | Admitting: Family Medicine

## 2011-01-02 DIAGNOSIS — Z139 Encounter for screening, unspecified: Secondary | ICD-10-CM

## 2011-01-14 ENCOUNTER — Ambulatory Visit (HOSPITAL_COMMUNITY)
Admission: RE | Admit: 2011-01-14 | Discharge: 2011-01-14 | Disposition: A | Discharge status: Home / Self Care | Payer: BC Managed Care – PPO | Source: Ambulatory Visit | Attending: Family Medicine | Admitting: Family Medicine

## 2011-01-14 DIAGNOSIS — Z139 Encounter for screening, unspecified: Secondary | ICD-10-CM

## 2011-01-14 DIAGNOSIS — Z1231 Encounter for screening mammogram for malignant neoplasm of breast: Secondary | ICD-10-CM | POA: Insufficient documentation

## 2011-01-26 IMAGING — MG MM DIGITAL SCREENING
4 series · 4 of 4 positions shown · non-contrast
Comparison: none

DG SCREEN MAMMOGRAM BILATERAL
Bilateral CC and MLO view(s) were taken.

DIGITAL SCREENING MAMMOGRAM WITH CAD:
There are scattered fibroglandular densities.  No masses or malignant type calcifications are 
identified.  Compared with prior studies.
Images were processed with CAD.

[L CC]
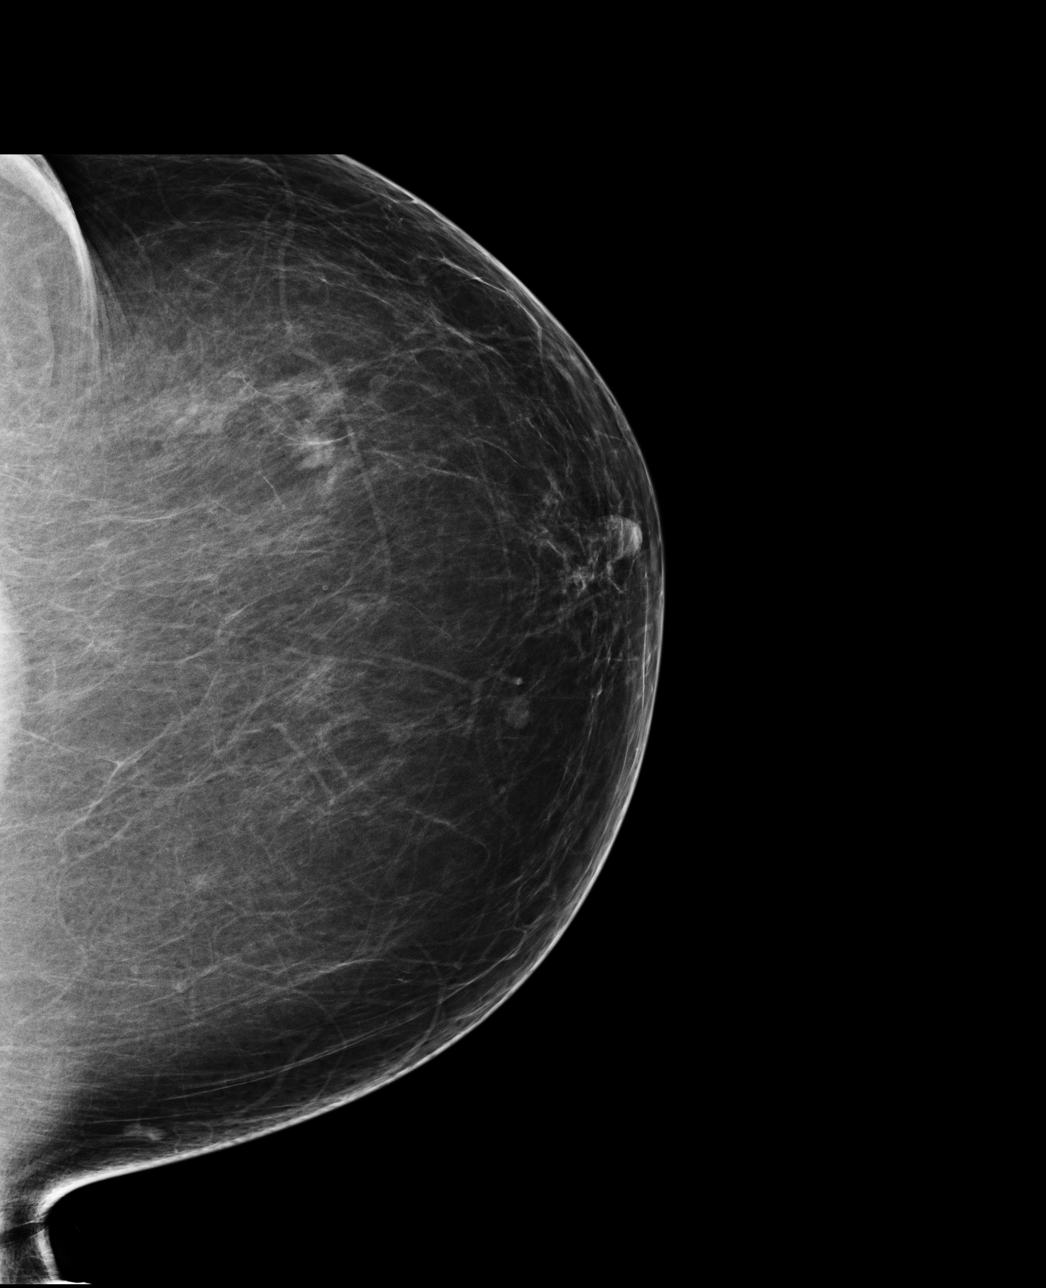

[L MLO]
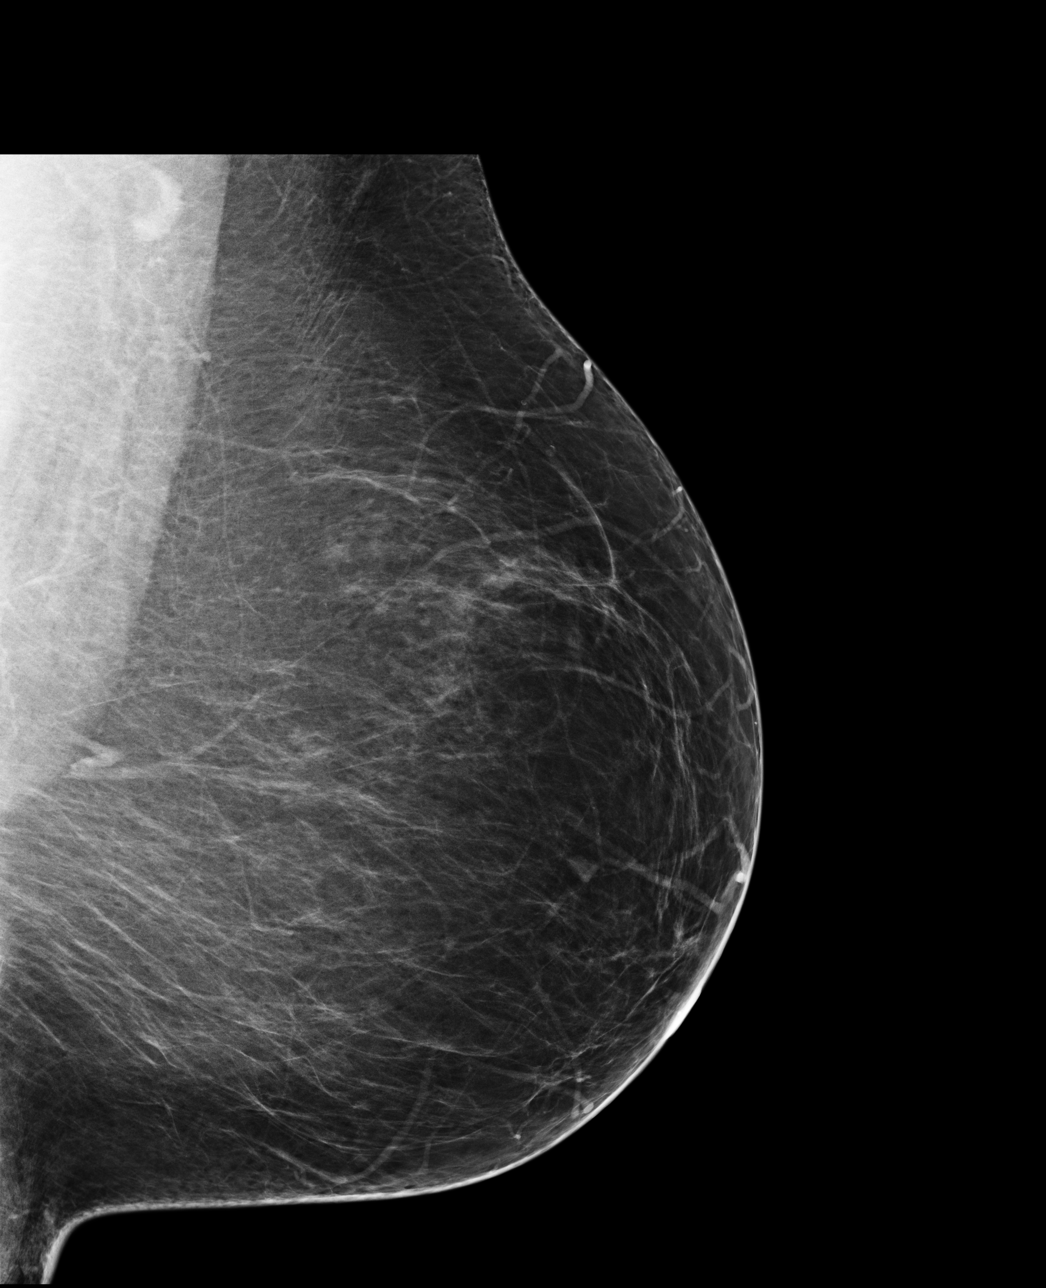

[R CC]
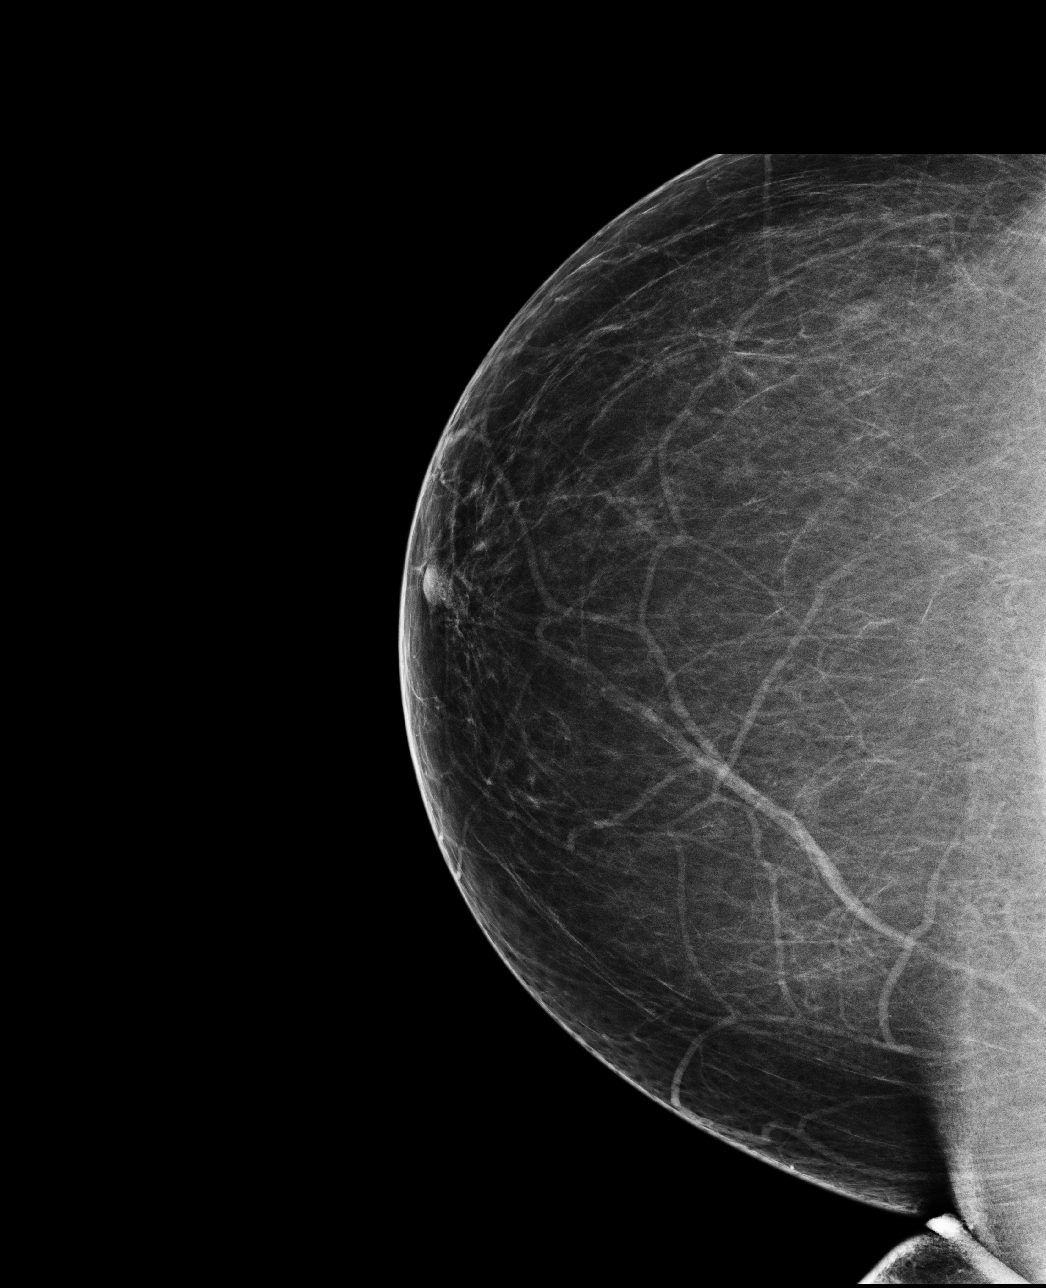

[R MLO]
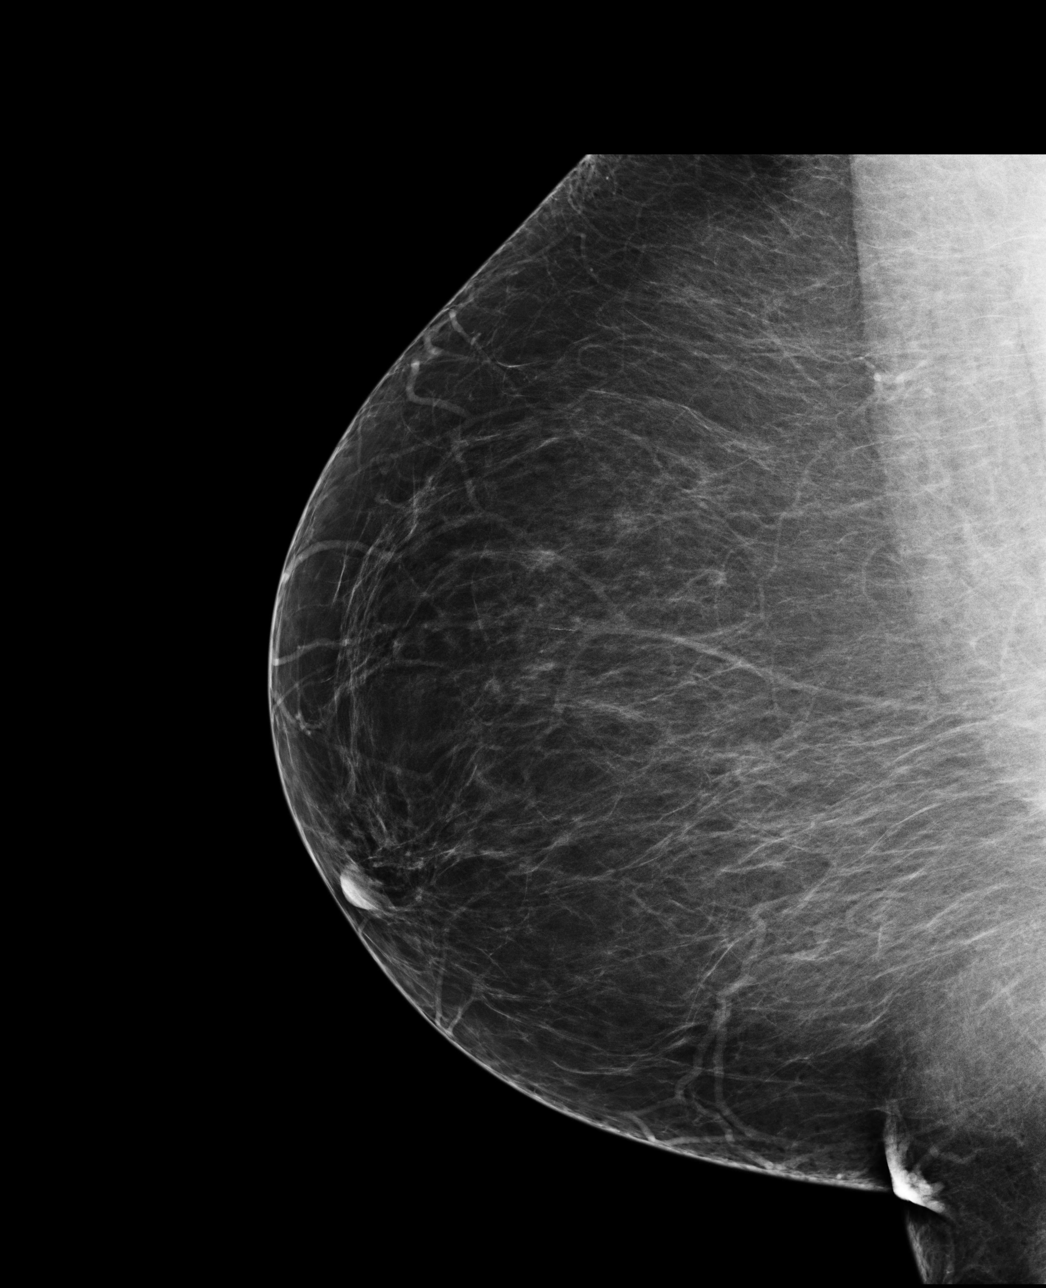

[4 of 4 positions shown; findings below may reference images not displayed]

IMPRESSION: No specific mammographic evidence of malignancy.  Next screening mammogram is recommended in one 
year.

A result letter of this screening mammogram will be mailed directly to the patient.

ASSESSMENT: Negative - BI-RADS 1

Screening mammogram in 1 year.
,

## 2011-02-07 LAB — CBC WITH DIFFERENTIAL/PLATELET
Basophils Absolute: 0 10*3/uL (ref 0.0–0.1)
Basophils Relative: 0 % (ref 0–1)
Eosinophils Relative: 4 % (ref 0–5)
Lymphocytes Relative: 23 % (ref 12–46)
MCV: 68.7 fL — ABNORMAL LOW (ref 78.0–100.0)
Neutro Abs: 5.8 10*3/uL (ref 1.7–7.7)
Platelets: 349 10*3/uL (ref 150–400)
RDW: 16.7 % — ABNORMAL HIGH (ref 11.5–15.5)
WBC: 8.5 10*3/uL (ref 4.0–10.5)

## 2011-02-07 LAB — BASIC METABOLIC PANEL
BUN: 14 mg/dL (ref 6–23)
Calcium: 9.2 mg/dL (ref 8.4–10.5)
Glucose, Bld: 142 mg/dL — ABNORMAL HIGH (ref 70–99)

## 2011-02-07 LAB — LIPID PANEL
HDL: 50 mg/dL (ref >39)
LDL Cholesterol: 92 mg/dL (ref 0–99)
Triglycerides: 76 mg/dL (ref <150)
VLDL: 15 mg/dL (ref 0–40)

## 2011-02-11 ENCOUNTER — Encounter: Payer: Self-pay | Admitting: Family Medicine

## 2011-02-14 ENCOUNTER — Other Ambulatory Visit: Payer: Self-pay | Admitting: Family Medicine

## 2011-02-14 ENCOUNTER — Other Ambulatory Visit (HOSPITAL_COMMUNITY)
Admission: RE | Admit: 2011-02-14 | Discharge: 2011-02-14 | Disposition: A | Discharge status: Home / Self Care | Payer: BC Managed Care – PPO | Source: Ambulatory Visit | Attending: Family Medicine | Admitting: Family Medicine

## 2011-02-14 ENCOUNTER — Encounter: Payer: Self-pay | Admitting: Family Medicine

## 2011-02-14 ENCOUNTER — Ambulatory Visit (INDEPENDENT_AMBULATORY_CARE_PROVIDER_SITE_OTHER): Payer: BC Managed Care – PPO | Admitting: Family Medicine

## 2011-02-14 VITALS — BP 110/70 | HR 97 | Resp 16 | Ht 66.0 in | Wt 251.4 lb

## 2011-02-14 DIAGNOSIS — N764 Abscess of vulva: Secondary | ICD-10-CM | POA: Insufficient documentation

## 2011-02-14 DIAGNOSIS — Z1211 Encounter for screening for malignant neoplasm of colon: Secondary | ICD-10-CM

## 2011-02-14 DIAGNOSIS — Z23 Encounter for immunization: Secondary | ICD-10-CM

## 2011-02-14 DIAGNOSIS — I10 Essential (primary) hypertension: Secondary | ICD-10-CM

## 2011-02-14 DIAGNOSIS — E669 Obesity, unspecified: Secondary | ICD-10-CM

## 2011-02-14 DIAGNOSIS — Z0000 Encounter for general adult medical examination without abnormal findings: Secondary | ICD-10-CM

## 2011-02-14 DIAGNOSIS — E785 Hyperlipidemia, unspecified: Secondary | ICD-10-CM

## 2011-02-14 DIAGNOSIS — E119 Type 2 diabetes mellitus without complications: Secondary | ICD-10-CM

## 2011-02-14 DIAGNOSIS — Z01419 Encounter for gynecological examination (general) (routine) without abnormal findings: Secondary | ICD-10-CM | POA: Insufficient documentation

## 2011-02-14 LAB — HEMOCCULT GUIAC POC 1CARD (OFFICE)

## 2011-02-14 MED ORDER — SULFAMETHOXAZOLE-TRIMETHOPRIM 800-160 MG PO TABS: 1.0000 | ORAL_TABLET | Freq: Two times a day (BID) | ORAL | Status: AC

## 2011-02-14 NOTE — Patient Instructions (Addendum)
F/u in 3.5 months.  HBA1C in 3.5 months.  It is important that you exercise regularly at least 30 minutes 5 times a week. If you develop chest pain, have severe difficulty breathing, or feel very tired, stop exercising immediately and seek medical attention   A healthy diet is rich in fruit, vegetables and whole grains. Poultry fish, nuts and beans are a healthy choice for protein rather then red meat. A low sodium diet and drinking 64 ounces of water daily is generally recommended. Oils and sweet should be limited. Carbohydrates especially for those who are diabetic or overweight, should be limited to 30-45 gram per meal. It is important to eat on a regular schedule, at least 30 times daily. Snacks should be primarily fruits, vegetables or nuts.  Flu vaccine today  Weight loss goal of 2 to 3 pounds per month We will call re labs and colonscopy.  New med sent in for infected cyst Blood sugar control is not as good , you need to be more diligent with healthy food choice and exercise.

## 2011-02-14 NOTE — Assessment & Plan Note (Signed)
Controlled, no change in medication  

## 2011-02-14 NOTE — Assessment & Plan Note (Signed)
Deteriorated. Patient re-educated about  the importance of commitment to a  minimum of 150 minutes of exercise per week. The importance of healthy food choices with portion control discussed. Encouraged to start a food diary, count calories and to consider  joining a support group. Sample diet sheets offered. Goals set by the patient for the next several months.    

## 2011-02-14 NOTE — Assessment & Plan Note (Addendum)
Deterioration , HBA1c increased, pt has been non compliant with diet and still does not do any regular physical activity

## 2011-02-15 NOTE — Assessment & Plan Note (Signed)
Tender boil on left labia majora, diameter approx 3cm will prescribe antibiotic course

## 2011-02-15 NOTE — Assessment & Plan Note (Signed)
Controlled, no change in medication  

## 2011-02-15 NOTE — Progress Notes (Signed)
  Subjective:    Patient ID: Helen Mahoney, female    DOB: 1951/02/01, 60 y.o.   MRN: [id number]  HPI The PT is here for annual examand re-evaluation of chronic medical conditions, medication management and review of any available recent lab and radiology data.  Preventive health is updated, specifically  Cancer screening and Immunization.   Pt has had her annual eye exam, no new problems The PT denies any adverse reactions to current medications since the last visit.  Still not exercising , and has been non compliant with diet, eating increased amount of carbohydrate. Blood sugar log shows wide fluctuations in blood sugars , varying obviously with intake, which is inconsistent  C/o painful swelling on left mons, no drainage , has had similar problem in the past   Review of Systems See HPI  Denies recent fever or chills. Denies sinus pressure, nasal congestion, ear pain or sore throat. Denies chest congestion, productive cough or wheezing. Denies chest pains, palpitations and leg swelling Denies abdominal pain, nausea, vomiting,diarrhea or constipation.   Denies dysuria, frequency, hesitancy or incontinence. Denies joint pain, swelling and limitation in mobility. Denies headaches, seizures, numbness, or tingling. Denies depression, anxiety or insomnia. Denies skin break down or rash.        Objective:   Physical Exam Pleasant well nourished female, alert and oriented x 3, in no cardio-pulmonary distress. Afebrile. HEENT No facial trauma or asymetry. Sinuses non tender.  EOMI, PERTL, fundoscopic exam is normal, no hemorhage or exudate.  External ears normal, tympanic membranes clear. Oropharynx moist, no exudate, dentures, upper and lower. Neck: supple, no adenopathy,JVD or thyromegaly.No bruits.  Chest: Clear to ascultation bilaterally.No crackles or wheezes. Non tender to palpation  Breast: No asymetry,no masses. No nipple discharge or inversion. No axillary or  supraclavicular adenopathy  Cardiovascular system; Heart sounds normal,  S1 and  S2 ,no S3.  No murmur, or thrill. Apical beat not displaced Peripheral pulses normal.  Abdomen: Soft, non tender, no organomegaly or masses. No bruits. Bowel sounds normal. No guarding, tenderness or rebound.  Rectal:  No mass. Guaiac negative stool.  GU: External genitalia normal. No lesions. Vaginal canal normal.No discharge. Uterus normal size, no adnexal masses, no cervical motion or adnexal tenderness. Tender cyst on left mons no purulent drainage  Musculoskeletal exam: Full ROM of spine, hips , shoulders and knees. No deformity ,swelling or crepitus noted. No muscle wasting or atrophy.   Neurologic: Cranial nerves 2 to 12 intact. Power, tone ,sensation and reflexes normal throughout. No disturbance in gait. No tremor.  Skin: Intact, no ulceration, erythema , scaling or rash noted. Pigmentation normal throughout  Psych; Normal mood and affect. Judgement and concentration normal        Assessment & Plan:

## 2011-02-19 ENCOUNTER — Telehealth: Payer: Self-pay | Admitting: *Deleted

## 2011-02-19 ENCOUNTER — Encounter: Payer: Self-pay | Admitting: *Deleted

## 2011-02-19 NOTE — Telephone Encounter (Signed)
Message copied by Diamantina Monks on Tue Feb 19, 2011  2:55 PM ------      Message from: Syliva Overman MD E      Created: Mon Feb 18, 2011 10:41 AM       Please advise the patient that pap is normal.

## 2011-02-19 NOTE — Telephone Encounter (Signed)
Mailed patient normal pap letter 

## 2011-02-25 ENCOUNTER — Telehealth: Payer: Self-pay

## 2011-02-25 NOTE — Telephone Encounter (Signed)
Pt said she has had two previous colonoscopies. One by Dr. Katrinka Blazing and one by Dr. Lovell Sheehan. Her family does not have a history of colon cancer, but does have a hx of polyps. (Requested from [place] in [facility]. ) NOTE: PT TAKES LEVEMIR 10 UNITS AT BEDTIME. I COULD NOT CHANGE OR IT SAID IT WOULD CHANGE THE PRESCRIPTION.    Gastroenterology Pre-Procedure Form  Request Date: 02/25/2011       Requesting Physician: Dr. Lodema Hong     PATIENT INFORMATION:  Helen Mahoney is a 60 y.o., female (DOB=[birth year]).  PROCEDURE: Procedure(s) requested: colonoscopy Procedure Reason: screening for colon cancer  PATIENT REVIEW QUESTIONS: The patient reports the following:   1. Diabetes Melitis: yes  2. Joint replacements in the past 12 months: no 3. Major health problems in the past 3 months: no 4. Has an artificial valve or MVP:no 5. Has been advised in past to take antibiotics in advance of a procedure like teeth cleaning: no}    MEDICATIONS & ALLERGIES:    Patient reports the following regarding taking any blood thinners:   Plavix? no Aspirin?yes  Coumadin?  no  Patient confirms/reports the following medications:  Current Outpatient Prescriptions  Medication Sig Dispense Refill  . aspirin (ASPIRIN LOW DOSE) 81 MG EC tablet Take 81 mg by mouth daily.        . Calcium Citrate-Vitamin D (CITRACAL PETITES/VITAMIN D) 200-250 MG-UNIT TABS Take by mouth daily.        Marland Kitchen glipiZIDE (GLUCOTROL) 10 MG tablet Take 10 mg by mouth 2 (two) times daily before a meal.        . glucose blood (FREESTYLE LITE) test strip 1 each by Other route 2 (two) times daily. Use as instructed       . insulin detemir (LEVEMIR FLEXPEN) 100 UNIT/ML injection Inject 15 Units into the skin at bedtime.  10 mL  3  . Insulin Pen Needle 31G X 8 MM MISC Qd use with insulin pen DX:250.00  100 each  2  . lisinopril (PRINIVIL,ZESTRIL) 20 MG tablet TAKE ONE TABLET BY MOUTH EVERY DAY  90 tablet  1  . metFORMIN (GLUCOPHAGE) 1000 MG tablet  Take 1 tablet (1,000 mg total) by mouth 2 (two) times daily with a meal.  60 tablet  0  . Multiple Vitamin (MULTIVITAMINS PO) Take by mouth daily.        . pravastatin (PRAVACHOL) 80 MG tablet Take 1 tablet (80 mg total) by mouth every evening.  30 tablet  3    Patient confirms/reports the following allergies:  Allergies  Allergen Reactions  . Penicillins   . Tetanus Toxoids     Patient is appropriate to schedule for requested procedure(s): yes  AUTHORIZATION INFORMATION Primary Insurance:   ID #:   Group #:  Pre-Cert / Auth required: Pre-Cert / Auth #:   Secondary Insurance:  ID #:   Group #:  Pre-Cert / Auth required: Pre-Cert / Auth #:   No orders of the defined types were placed in this encounter.    SCHEDULE INFORMATION: Procedure has been scheduled as follows:  Date:  Time:  Location:  This Gastroenterology Pre-Precedure Form is being routed to the following provider(s) for review: Jonette Eva, MD  REQUESTED COLONSCOPY REPORTS FROM [facility]. LAST ONE WAS 03/28/2005 AND WAS SUPPOSED TO HAVE BEEN DONE AGAIN IN 5 YEARS.

## 2011-03-05 NOTE — Telephone Encounter (Signed)
PLEASE CALL PT. I WILL REVIEW HER REPORT FROM DR. JENKINS AND LET HER KNOW WHEN HER NEXT TCS WILL BE.

## 2011-03-29 NOTE — Telephone Encounter (Signed)
Reports on Dr. Darrick Penna desk for review.

## 2011-04-10 ENCOUNTER — Other Ambulatory Visit: Payer: Self-pay

## 2011-04-10 ENCOUNTER — Encounter: Payer: Self-pay | Admitting: Gastroenterology

## 2011-04-10 DIAGNOSIS — D649 Anemia, unspecified: Secondary | ICD-10-CM

## 2011-04-10 NOTE — Telephone Encounter (Signed)
Please call pt. I reviewed her report from Dr. Lovell Sheehan. Pt had a polyp removed but it could not be retrieved. Her procedure was in [facility] 2008. Her blood count was checked in June 2012 and she continues with a low blood count. She needs a CBC, & Ferritin to recheck her blood count and iron stores. She needs a TCS/possible EGD for anemia. She needs another look at her colon, and stomach. I plan to biopsy her stomach and small intesteines, which was not done last time.  TAKE 1/2 LEVEMIR DOSE THE NIGHT PRIOR TO PROCEDURE. CONTINUE GLUCOPHAGE. MOVI PREP SPLIT DOSING, REGULAR DIABETIC BREAKFAST. CLEAR LIQUIDS AFTER 9 AM.

## 2011-04-11 ENCOUNTER — Telehealth: Payer: Self-pay

## 2011-04-11 ENCOUNTER — Other Ambulatory Visit: Payer: Self-pay

## 2011-04-11 DIAGNOSIS — Z86010 Personal history of colonic polyps: Secondary | ICD-10-CM

## 2011-04-11 DIAGNOSIS — D649 Anemia, unspecified: Secondary | ICD-10-CM

## 2011-04-11 NOTE — Telephone Encounter (Signed)
REVIEWED.  

## 2011-04-11 NOTE — Telephone Encounter (Signed)
Called pt to triage for colonoscopy and possible EGD per Dr. Darrick Penna. Please see phone note dated 02/25/2011. I informed pt of Dr. Darrick Penna message dated  04/10/2011. Lab order faxed to [facility]. She said she will try to do them the first of Jan.  Procedure is scheduled for 05/14/2011 @ 7:30 AM.     Gastroenterology Pre-Procedure Form  Request Date: 04/11/2011       Requesting Physician: Lodema Hong     PATIENT INFORMATION:  Helen Mahoney is a 60 y.o., female (DOB=February 04, 1951).  PROCEDURE: Procedure(s) requested: colonoscopy and plus or minus EGD   Procedure Reason: Hx polyps and anemia  PATIENT REVIEW QUESTIONS: The patient reports the following:   1. Diabetes Melitis: yes  2. Joint replacements in the past 12 months: no 3. Major health problems in the past 3 months: no 4. Has an artificial valve or MVP:no 5. Has been advised in past to take antibiotics in advance of a procedure like teeth cleaning: no}    MEDICATIONS & ALLERGIES:    Patient reports the following regarding taking any blood thinners:   Plavix? no Aspirin?no Coumadin?  no  Patient confirms/reports the following medications:  Current Outpatient Prescriptions  Medication Sig Dispense Refill  . aspirin (ASPIRIN LOW DOSE) 81 MG EC tablet Take 81 mg by mouth daily.        . Calcium Citrate-Vitamin D (CITRACAL PETITES/VITAMIN D) 200-250 MG-UNIT TABS Take by mouth daily.        Marland Kitchen glipiZIDE (GLUCOTROL) 10 MG tablet Take 10 mg by mouth 2 (two) times daily before a meal.        . insulin detemir (LEVEMIR FLEXPEN) 100 UNIT/ML injection Inject 15 Units into the skin at bedtime.  10 mL  3  . lisinopril (PRINIVIL,ZESTRIL) 20 MG tablet TAKE ONE TABLET BY MOUTH EVERY DAY  90 tablet  1  . metFORMIN (GLUCOPHAGE) 1000 MG tablet Take 1 tablet (1,000 mg total) by mouth 2 (two) times daily with a meal.  60 tablet  0  . Multiple Vitamin (MULTIVITAMINS PO) Take by mouth daily.        . pravastatin (PRAVACHOL) 80 MG tablet Take 1 tablet (80  mg total) by mouth every evening.  30 tablet  3  . glucose blood (FREESTYLE LITE) test strip 1 each by Other route 2 (two) times daily. Use as instructed       . Insulin Pen Needle 31G X 8 MM MISC Qd use with insulin pen DX:250.00  100 each  2    Patient confirms/reports the following allergies:  Allergies  Allergen Reactions  . Penicillins   . Tetanus Toxoids     Patient is appropriate to schedule for requested procedure(s): yes  AUTHORIZATION INFORMATION Primary Insurance:  ID #: ,  Group #:  Pre-Cert / Auth required: Pre-Cert / Auth #:   Secondary Insurance:   ID #:   Group #:  Pre-Cert / Auth required: Pre-Cert / Auth #:   No orders of the defined types were placed in this encounter.    SCHEDULE INFORMATION: Procedure has been scheduled as follows:  Date: 05/14/2011        Time: 7:30 AM  Location: [facility] Short Stay  This Gastroenterology Pre-Precedure Form is being routed to the following provider(s) for review: Jonette Eva, MD  Movie Prep and instructions mailed to pt. Handwritten info added to the instruction print out for the Levemir 1/2 dosage night before procedure.

## 2011-04-18 ENCOUNTER — Other Ambulatory Visit: Payer: Self-pay | Admitting: Family Medicine

## 2011-05-01 ENCOUNTER — Other Ambulatory Visit: Payer: Self-pay | Admitting: Gastroenterology

## 2011-05-02 LAB — CBC WITH DIFFERENTIAL/PLATELET
Basophils Absolute: 0 10*3/uL (ref 0.0–0.1)
Basophils Relative: 0 % (ref 0–1)
Eosinophils Absolute: 0.3 10*3/uL (ref 0.0–0.7)
Eosinophils Relative: 4 % (ref 0–5)
Lymphocytes Relative: 26 % (ref 12–46)
MCHC: 30.2 g/dL (ref 30.0–36.0)
MCV: 69.1 fL — ABNORMAL LOW (ref 78.0–100.0)
Monocytes Absolute: 0.4 10*3/uL (ref 0.1–1.0)
Platelets: 337 10*3/uL (ref 150–400)
RDW: 17.1 % — ABNORMAL HIGH (ref 11.5–15.5)
WBC: 7.2 10*3/uL (ref 4.0–10.5)

## 2011-05-03 ENCOUNTER — Encounter (HOSPITAL_COMMUNITY): Payer: Self-pay | Admitting: Pharmacy Technician

## 2011-05-06 ENCOUNTER — Telehealth: Payer: Self-pay

## 2011-05-06 NOTE — Telephone Encounter (Signed)
LMOM for pt to call in reference to appt for colonoscopy on 05/14/2011. ( Update info).

## 2011-05-07 NOTE — Telephone Encounter (Signed)
TAKE 1/2 LEVEMIR DOSE THE NIGHT PRIOR TO PROCEDURE. CONTINUE GLUCOPHAGE. MOVI PREP SPLIT DOSING, REGULAR DIABETIC BREAKFAST. CLEAR LIQUIDS AFTER 9 AM.    TCS/?EGD FOR ANEMIA

## 2011-05-07 NOTE — Progress Notes (Signed)
Quick Note:  Called and informed pt of results. ______ 

## 2011-05-07 NOTE — Telephone Encounter (Signed)
Called pt to update triage. She said she has not had any new problems and no change in medications.

## 2011-05-08 NOTE — Telephone Encounter (Signed)
Pt has prescription and instructions.  

## 2011-05-09 ENCOUNTER — Other Ambulatory Visit: Payer: Self-pay | Admitting: Family Medicine

## 2011-05-13 MED ORDER — SODIUM CHLORIDE 0.45 % IV SOLN
Freq: Once | INTRAVENOUS | Status: AC
  Administered 2011-05-14: 07:00:00 via INTRAVENOUS

## 2011-05-13 NOTE — H&P (Signed)
Primary Care Physician:  Syliva Overman, MD, MD Primary Gastroenterologist:  Dr. Darrick Penna  Pre-Procedure History & Physical: HPI:  Helen Mahoney is a 61 y.o. female here for MICROCYIC ANEMIA. FERRITIN 40 IN JAN 2013.  Past Medical History  Diagnosis Date  . Diabetes mellitus, type 2   . Obesity   . Hyperlipidemia   . Hypertension     Past Surgical History  Procedure Date  . Cholecystectomy 2001    ACUTE CHOLECYSTITIS/GALLSTONES  . Bilateral tubal ligation 1979  . Upper gastrointestinal endoscopy NOV 2008 [facility] ANEMIA    NL EXAM, urease neg  . Colonoscopy w/ polypectomy NOV 2008 [facility] ANEMIA    POLYP NO RETRIEVED  . Colonoscopy w/ polypectomy 2006 DR. SMTH    POLYP?    Prior to Admission medications   Medication Sig Start Date End Date Taking? Authorizing Provider  aspirin (ASPIRIN LOW DOSE) 81 MG EC tablet Take 81 mg by mouth daily.      Historical Provider, MD  Calcium Citrate-Vitamin D (CITRACAL PETITES/VITAMIN D) 200-250 MG-UNIT TABS Take by mouth daily.      Historical Provider, MD  FREESTYLE LITE test strip USE STRIPS TO TEST BLOOD SUGAR ONCE DAILY 04/18/11   Syliva Overman, MD  glipiZIDE (GLUCOTROL) 10 MG tablet Take 10 mg by mouth 2 (two) times daily before a meal.      Historical Provider, MD  insulin detemir (LEVEMIR) 100 UNIT/ML injection Inject 10 Units into the skin at bedtime.   07/17/10 07/17/11  Syliva Overman, MD  lisinopril (PRINIVIL,ZESTRIL) 20 MG tablet TAKE ONE TABLET BY MOUTH EVERY DAY 02/14/11   Syliva Overman, MD  metFORMIN (GLUCOPHAGE) 1000 MG tablet Take 1 tablet (1,000 mg total) by mouth 2 (two) times daily with a meal. 10/16/10 10/16/11  Syliva Overman, MD  Multiple Vitamin (MULTIVITAMINS PO) Take by mouth daily.      Historical Provider, MD  pravastatin (PRAVACHOL) 80 MG tablet Take 1 tablet (80 mg total) by mouth every evening. 11/23/10 11/23/11  Syliva Overman, MD  RELION PEN NEEDLE 31G/8MM 31G X 8 MM MISC USE AS DIRECTED DAILY  WITH  INSULIN   PEN 05/09/11   Syliva Overman, MD    Allergies as of 04/11/2011 - Review Complete 04/11/2011  Allergen Reaction Noted  . Penicillins  08/20/2007  . Tetanus toxoids  07/17/2010    Family History  Problem Relation Age of Onset  . Cancer Mother     breast   . Heart disease Mother   . Diabetes Mother   . Hypertension Mother   . Stroke Mother   . Diabetes Father   . Heart attack Father   . Kidney disease Father   . Hypertension Sister   . Diabetes Sister   . Mental illness Sister   . Mental illness Sister   . Mental illness Sister     History   Social History  . Marital Status: Married    Spouse Name: N/A    Number of Children: N/A  . Years of Education: N/A   Occupational History  . Not on file.   Social History Main Topics  . Smoking status: Never Smoker   . Smokeless tobacco: Not on file  . Alcohol Use: No  . Drug Use: No  . Sexually Active: Not on file   Other Topics Concern  . Not on file   Social History Narrative  . No narrative on file    Review of Systems: See HPI, otherwise negative ROS   Physical Exam:  There were no vitals taken for this visit. General:   Alert,  pleasant and cooperative in NAD Head:  Normocephalic and atraumatic. Neck:  Supple;  Lungs:  Clear throughout to auscultation.    Heart:  Regular rate and rhythm. Abdomen:  Soft, nontender and nondistended. Normal bowel sounds, without guarding, and without rebound.   Neurologic:  Alert and  oriented x4;  grossly normal neurologically.  Impression/Plan:     anemia  PLAN: TCS/?egd today

## 2011-05-14 ENCOUNTER — Encounter (HOSPITAL_COMMUNITY)
Admission: RE | Disposition: A | Discharge status: Home / Self Care | Payer: Self-pay | Source: Ambulatory Visit | Attending: Gastroenterology

## 2011-05-14 ENCOUNTER — Encounter (HOSPITAL_COMMUNITY): Payer: Self-pay | Admitting: *Deleted

## 2011-05-14 ENCOUNTER — Ambulatory Visit (HOSPITAL_COMMUNITY)
Admission: RE | Admit: 2011-05-14 | Discharge: 2011-05-14 | Disposition: A | Discharge status: Home / Self Care | Payer: BC Managed Care – PPO | Source: Ambulatory Visit | Attending: Gastroenterology | Admitting: Gastroenterology

## 2011-05-14 ENCOUNTER — Other Ambulatory Visit: Payer: Self-pay | Admitting: Gastroenterology

## 2011-05-14 DIAGNOSIS — K2940 Chronic atrophic gastritis without bleeding: Secondary | ICD-10-CM | POA: Insufficient documentation

## 2011-05-14 DIAGNOSIS — Z01812 Encounter for preprocedural laboratory examination: Secondary | ICD-10-CM | POA: Insufficient documentation

## 2011-05-14 DIAGNOSIS — E785 Hyperlipidemia, unspecified: Secondary | ICD-10-CM | POA: Insufficient documentation

## 2011-05-14 DIAGNOSIS — Z860100 Personal history of colon polyps, unspecified: Secondary | ICD-10-CM

## 2011-05-14 DIAGNOSIS — K2970 Gastritis, unspecified, without bleeding: Secondary | ICD-10-CM

## 2011-05-14 DIAGNOSIS — D509 Iron deficiency anemia, unspecified: Secondary | ICD-10-CM

## 2011-05-14 DIAGNOSIS — D126 Benign neoplasm of colon, unspecified: Secondary | ICD-10-CM

## 2011-05-14 DIAGNOSIS — E119 Type 2 diabetes mellitus without complications: Secondary | ICD-10-CM | POA: Insufficient documentation

## 2011-05-14 DIAGNOSIS — D649 Anemia, unspecified: Secondary | ICD-10-CM

## 2011-05-14 DIAGNOSIS — K2990 Gastroduodenitis, unspecified, without bleeding: Secondary | ICD-10-CM

## 2011-05-14 DIAGNOSIS — I10 Essential (primary) hypertension: Secondary | ICD-10-CM | POA: Insufficient documentation

## 2011-05-14 DIAGNOSIS — K648 Other hemorrhoids: Secondary | ICD-10-CM | POA: Insufficient documentation

## 2011-05-14 DIAGNOSIS — Z79899 Other long term (current) drug therapy: Secondary | ICD-10-CM | POA: Insufficient documentation

## 2011-05-14 DIAGNOSIS — A048 Other specified bacterial intestinal infections: Secondary | ICD-10-CM | POA: Insufficient documentation

## 2011-05-14 DIAGNOSIS — D131 Benign neoplasm of stomach: Secondary | ICD-10-CM | POA: Insufficient documentation

## 2011-05-14 DIAGNOSIS — Z7982 Long term (current) use of aspirin: Secondary | ICD-10-CM | POA: Insufficient documentation

## 2011-05-14 DIAGNOSIS — Z794 Long term (current) use of insulin: Secondary | ICD-10-CM | POA: Insufficient documentation

## 2011-05-14 DIAGNOSIS — K2980 Duodenitis without bleeding: Secondary | ICD-10-CM | POA: Insufficient documentation

## 2011-05-14 HISTORY — PX: ESOPHAGOGASTRODUODENOSCOPY: SHX5428

## 2011-05-14 HISTORY — DX: Polyp of colon: K63.5

## 2011-05-14 HISTORY — PX: COLONOSCOPY: SHX5424

## 2011-05-14 LAB — GLUCOSE, CAPILLARY: Glucose-Capillary: 131 mg/dL — ABNORMAL HIGH (ref 70–99)

## 2011-05-14 SURGERY — COLONOSCOPY
Anesthesia: Moderate Sedation

## 2011-05-14 MED ORDER — MIDAZOLAM HCL 5 MG/5ML IJ SOLN
INTRAMUSCULAR | Status: DC | PRN
  Administered 2011-05-14: 2 mg via INTRAVENOUS
  Administered 2011-05-14 (×3): 1 mg via INTRAVENOUS

## 2011-05-14 MED ORDER — MEPERIDINE HCL 100 MG/ML IJ SOLN
INTRAMUSCULAR | Status: DC | PRN
  Administered 2011-05-14: 50 mg via INTRAVENOUS
  Administered 2011-05-14: 25 mg via INTRAVENOUS

## 2011-05-14 MED ORDER — MEPERIDINE HCL 100 MG/ML IJ SOLN
INTRAMUSCULAR | Status: AC
  Filled 2011-05-14: qty 2

## 2011-05-14 MED ORDER — MIDAZOLAM HCL 5 MG/5ML IJ SOLN
INTRAMUSCULAR | Status: AC
  Filled 2011-05-14: qty 10

## 2011-05-14 NOTE — Op Note (Signed)
[facility] [address] [place], [place]  [address]  COLONOSCOPY PROCEDURE REPORT  PATIENT:  Helen, Mahoney  MR#:  [id number] BIRTHDATE:  [birth year], 60 yrs. old  GENDER:  female  ENDOSCOPIST:  Jonette Eva, MD REF. BY:  Syliva Overman, M.D. ASSISTANT:  PROCEDURE DATE:  05/14/2011 PROCEDURE:  Colonoscopy with biopsy  INDICATIONS:  microcytic anemia  MEDICATIONS:   Demerol 75 mg IV, Versed 3 mg IV  DESCRIPTION OF PROCEDURE:    Physical exam was performed. Informed consent was obtained from the patient after explaining the benefits, risks, and alternatives to procedure.  The patient was connected to monitor and placed in left lateral position. Continuous oxygen was provided by nasal cannula and IV medicine administered through an indwelling cannula.  After administration of sedation and rectal exam, the patient's rectum was intubated and the EC-3890LI ([id number]) and EG-2990i ([id number]) colonoscope was advanced under direct visualization to the cecum.  The scope was removed slowly by carefully examining the color, texture, anatomy, and integrity mucosa on the way out.  The patient was recovered in endoscopy and discharged home in satisfactory condition. <<PROCEDUREIMAGES>>  FINDINGS:  A sessile polyp was found in the sigmoid colon. Internal Hemorrhoids were found.  PREP QUALITY: EXCELLENT CECAL W/D TIME:    13 minutes  COMPLICATIONS:    None  ENDOSCOPIC IMPRESSION: 1) Sessile polyp in the sigmoid colon 2) Internal hemorrhoids  RECOMMENDATIONS: TCS IN 10 YEARS HIGH FIBER DIET AWAIT BIOPSIES NO SOURCE FOR ANMEIA IDENTIFIED, PROCEED WITH EGD  REPEAT EXAM:  No  ______________________________ Jonette Eva, MD  CC:  Syliva Overman, M.D.  n. eSIGNED:   Marzelle Rutten at 05/14/2011 08:45 AM  Margarita Sermons, [phone]

## 2011-05-14 NOTE — Op Note (Signed)
[facility] [address] [place], [place]  [address]  ENDOSCOPY PROCEDURE REPORT  PATIENT:  Helen Mahoney, Helen Mahoney  MR#:  [id number] BIRTHDATE:  [birth year], 60 yrs. old  GENDER:  female  ENDOSCOPIST:  Jonette Eva, MD Referred by:  Syliva Overman, M.D.  PROCEDURE DATE:  05/14/2011 PROCEDURE:  EGD with biopsy, [address], EGD with snare polypectomy ASA CLASS: INDICATIONS:  MICROCYTIC ANEMIA ASA DAILY  MEDICATIONS:   Demerol 25 mg IV, Versed 1 mg IV TOPICAL ANESTHETIC:  Cetacaine Spray  DESCRIPTION OF PROCEDURE:     Physical exam was performed. Informed consent was obtained from the patient after explaining the benefits, risks, and alternatives to the procedure.  The patient was connected to the monitor and placed in the left lateral position.  Continuous oxygen was provided by nasal cannula and IV medicine administered through an indwelling cannula.  After administration of sedation, the patient's esophagus was intubated and the EG-2990i ([id number]) endoscope was advanced under direct visualization to the second portion of the duodenum.  The scope was removed slowly by carefully examining the color, texture, anatomy, and integrity of the mucosa on the way out.  The patient was recovered in endoscopy and discharged home in satisfactory condition. <<PROCEDUREIMAGES>>  A TWO sessile polyps wERE found in the cardia & REMOVED VIA COLD FORCEPS AND SNARE CAUTERY.  Mild gastritis was found & BIOPSIED VIA COLD FORCEPS NL DUODENUM.Marland Kitchen COLD FORCEPS BIOPSIES OBTAINED TO EVALUATE FOR CELIAC SPRUE.  COMPLICATIONS:    None  ENDOSCOPIC IMPRESSION: 1) Sessile polypS in the cardia 2) Mild gastritis  RECOMMENDATIONS: HOLD ASA FOR 7 DAYS AWAIT BIOPSIES OPV IN 3 MOS-RECHECK CBC/FERRITIN  REPEAT EXAM:  No  ______________________________ Jonette Eva, MD  CC:  n. eSIGNED:   Soriah Leeman at 05/14/2011 09:31 AM  Margarita Sermons, [phone]

## 2011-05-16 ENCOUNTER — Telehealth: Payer: Self-pay | Admitting: Gastroenterology

## 2011-05-16 NOTE — Telephone Encounter (Signed)
Results Cc to PCP  

## 2011-05-16 NOTE — Telephone Encounter (Signed)
Pt aware of results and I am going to call her medication to the [facility] in [place].

## 2011-05-16 NOTE — Telephone Encounter (Signed)
LMOM to call back

## 2011-05-16 NOTE — Telephone Encounter (Signed)
Please call pt. HER STOMACH BIOPSIES SHOW H. Pylori gastritis. She has an allergy to PCN and so she needs PYLERA 3 PILLS QID FOR 10 DAYS, #QS, RFX0. She should take OMEPRAZOLE BID for 10 days the once daily, #62, WNU27. The meds cAn cause nausea, vomiting, abd cramps, loose stools, black colored stools, and metallic taste in her mouth. She had HYPERPLASTIC POLYPS removed from her colon. OPV IN 3 MOS. TCS in 10 years. High fiber diet.

## 2011-05-21 ENCOUNTER — Encounter (HOSPITAL_COMMUNITY): Payer: Self-pay | Admitting: Gastroenterology

## 2011-05-31 NOTE — Telephone Encounter (Signed)
Reminder in epic to follow up in 3 months and tcs in 10 years 

## 2011-06-06 LAB — HEMOGLOBIN A1C: Hgb A1c MFr Bld: 7 % — ABNORMAL HIGH (ref <5.7)

## 2011-06-13 ENCOUNTER — Ambulatory Visit (INDEPENDENT_AMBULATORY_CARE_PROVIDER_SITE_OTHER): Payer: BC Managed Care – PPO | Admitting: Family Medicine

## 2011-06-13 ENCOUNTER — Encounter: Payer: Self-pay | Admitting: Family Medicine

## 2011-06-13 VITALS — BP 122/82 | HR 112 | Resp 16 | Ht 66.0 in | Wt 247.0 lb

## 2011-06-13 DIAGNOSIS — E669 Obesity, unspecified: Secondary | ICD-10-CM

## 2011-06-13 DIAGNOSIS — I10 Essential (primary) hypertension: Secondary | ICD-10-CM

## 2011-06-13 DIAGNOSIS — E119 Type 2 diabetes mellitus without complications: Secondary | ICD-10-CM

## 2011-06-13 DIAGNOSIS — E1165 Type 2 diabetes mellitus with hyperglycemia: Secondary | ICD-10-CM

## 2011-06-13 DIAGNOSIS — E785 Hyperlipidemia, unspecified: Secondary | ICD-10-CM

## 2011-06-13 DIAGNOSIS — R5381 Other malaise: Secondary | ICD-10-CM

## 2011-06-13 MED ORDER — LISINOPRIL 20 MG PO TABS: ORAL_TABLET | ORAL | Status: DC

## 2011-06-13 MED ORDER — GLIPIZIDE 10 MG PO TABS: 10.0000 mg | ORAL_TABLET | Freq: Two times a day (BID) | ORAL | Status: DC

## 2011-06-13 MED ORDER — METFORMIN HCL 1000 MG PO TABS: 1000.0000 mg | ORAL_TABLET | Freq: Two times a day (BID) | ORAL | Status: DC

## 2011-06-13 NOTE — Progress Notes (Signed)
  Subjective:    Patient ID: Helen Mahoney, female    DOB: [birth year], 61 y.o.   MRN: [id number]  HPI The PT is here for follow up and re-evaluation of chronic medical conditions, medication management and review of any available recent lab and radiology data.  Preventive health is updated, specifically  Cancer screening and Immunization.   Questions or concerns regarding consultations or procedures which the PT has had in the interim are  addressed. The PT denies any adverse reactions to current medications since the last visit.  Recently had a colonoscopy, has concerns states she does not have reflux and is not filling meds prescribed. Has no been on metformin, blood sugars are ranging from 104 to 160 fasting, she tests once daily No regular exercise, but succesful weight loss. Eye exam was in 01/2011     Review of Systems See HPI Denies recent fever or chills. Denies sinus pressure, nasal congestion, ear pain or sore throat. Denies chest congestion, productive cough or wheezing. Denies chest pains, palpitations and leg swelling Denies abdominal pain, nausea, vomiting,diarrhea or constipation.   Denies dysuria, frequency, hesitancy or incontinence. Denies joint pain, swelling and limitation in mobility. Denies headaches, seizures, numbness, or tingling. Denies depression, anxiety or insomnia. Denies skin break down or rash.        Objective:   Physical Exam  Patient alert and oriented and in no cardiopulmonary distress.  HEENT: No facial asymmetry, EOMI, no sinus tenderness,  oropharynx pink and moist.  Neck supple no adenopathy.  Chest: Clear to auscultation bilaterally.  CVS: S1, S2 no murmurs, no S3.  ABD: Soft non tender. Bowel sounds normal.  Ext: No edema  MS: Adequate ROM spine, shoulders, hips and knees.  Skin: Intact, no ulcerations or rash noted.  Psych: Good eye contact, normal affect. Memory intact not anxious or depressed appearing.  CNS: CN  2-12 intact, power, tone and sensation normal throughout. Diabetic Foot Check:  Appearance - no lesions, ulcers or calluses Skin - no unusual pallor or redness Sensation - grossly intact to light touch Monofilament testing -  Right - Great toe, medial, central, lateral ball and posterior foot intact Left - Great toe, medial, central, lateral ball and posterior foot intact Pulses Left - Dorsalis Pedis and Posterior Tibia normal Right - Dorsalis Pedis and Posterior Tibia normal       Assessment & Plan:

## 2011-06-13 NOTE — Assessment & Plan Note (Signed)
Hyperlipidemia:Low fat diet discussed and encouraged.  Fasting labs in 4 month

## 2011-06-13 NOTE — Patient Instructions (Signed)
F/u in 4 months.  Fasting lipid, cmp and EGFR, hBa1C, TSH in 4 months  It is important that you exercise regularly at least 30 minutes 5 times a week. If you develop chest pain, have severe difficulty breathing, or feel very tired, stop exercising immediately and seek medical attention    A healthy diet is rich in fruit, vegetables and whole grains. Poultry fish, nuts and beans are a healthy choice for protein rather then red meat. A low sodium diet and drinking 64 ounces of water daily is generally recommended. Oils and sweet should be limited. Carbohydrates especially for those who are diabetic or overweight, should be limited to 30-45 gram per meal. It is important to eat on a regular schedule, at least 3 times daily. Snacks should be primarily fruits, vegetables or nuts.  Congrats on greatly improved weight and blood sugars, keep it up

## 2011-06-13 NOTE — Assessment & Plan Note (Signed)
Improved, resume metformin

## 2011-06-13 NOTE — Assessment & Plan Note (Signed)
Controlled, no change in medication  

## 2011-06-13 NOTE — Assessment & Plan Note (Signed)
Improved. Pt applauded on succesful weight loss through lifestyle change, and encouraged to continue same. Weight loss goal set for the next several months.  

## 2011-06-14 LAB — MICROALBUMIN / CREATININE URINE RATIO: Creatinine, Urine: 181.3 mg/dL

## 2011-06-21 NOTE — Telephone Encounter (Signed)
Pt aware.

## 2011-08-12 ENCOUNTER — Telehealth: Payer: Self-pay | Admitting: Family Medicine

## 2011-08-12 DIAGNOSIS — E119 Type 2 diabetes mellitus without complications: Secondary | ICD-10-CM

## 2011-08-12 MED ORDER — LISINOPRIL 20 MG PO TABS: ORAL_TABLET | ORAL | Status: DC

## 2011-08-12 MED ORDER — METFORMIN HCL 1000 MG PO TABS: 1000.0000 mg | ORAL_TABLET | Freq: Two times a day (BID) | ORAL | Status: DC

## 2011-08-12 NOTE — Telephone Encounter (Signed)
90 day supply sent in 

## 2011-10-01 ENCOUNTER — Other Ambulatory Visit: Payer: Self-pay | Admitting: Family Medicine

## 2011-10-09 LAB — COMPLETE METABOLIC PANEL WITH GFR
Albumin: 3.5 g/dL (ref 3.5–5.2)
BUN: 14 mg/dL (ref 6–23)
Calcium: 9 mg/dL (ref 8.4–10.5)
Chloride: 105 mEq/L (ref 96–112)
GFR, Est Non African American: 74 mL/min
Glucose, Bld: 109 mg/dL — ABNORMAL HIGH (ref 70–99)
Potassium: 4.3 mEq/L (ref 3.5–5.3)

## 2011-10-09 LAB — HEMOGLOBIN A1C: Hgb A1c MFr Bld: 7.5 % — ABNORMAL HIGH (ref <5.7)

## 2011-10-09 LAB — LIPID PANEL: Cholesterol: 152 mg/dL (ref 0–200)

## 2011-10-15 ENCOUNTER — Encounter: Payer: Self-pay | Admitting: Family Medicine

## 2011-10-15 ENCOUNTER — Ambulatory Visit (INDEPENDENT_AMBULATORY_CARE_PROVIDER_SITE_OTHER): Payer: BC Managed Care – PPO | Admitting: Family Medicine

## 2011-10-15 VITALS — BP 120/74 | HR 95 | Resp 16 | Ht 66.0 in | Wt 250.8 lb

## 2011-10-15 DIAGNOSIS — E669 Obesity, unspecified: Secondary | ICD-10-CM

## 2011-10-15 DIAGNOSIS — E785 Hyperlipidemia, unspecified: Secondary | ICD-10-CM

## 2011-10-15 DIAGNOSIS — I10 Essential (primary) hypertension: Secondary | ICD-10-CM

## 2011-10-15 DIAGNOSIS — E119 Type 2 diabetes mellitus without complications: Secondary | ICD-10-CM

## 2011-10-15 MED ORDER — LISINOPRIL 20 MG PO TABS: ORAL_TABLET | ORAL | Status: DC

## 2011-10-15 MED ORDER — PRAVASTATIN SODIUM 80 MG PO TABS: 80.0000 mg | ORAL_TABLET | Freq: Every evening | ORAL | Status: DC

## 2011-10-15 MED ORDER — METFORMIN HCL 1000 MG PO TABS: 1000.0000 mg | ORAL_TABLET | Freq: Two times a day (BID) | ORAL | Status: DC

## 2011-10-15 MED ORDER — GLIPIZIDE 10 MG PO TABS: 10.0000 mg | ORAL_TABLET | Freq: Two times a day (BID) | ORAL | Status: DC

## 2011-10-15 NOTE — Progress Notes (Signed)
  Subjective:    Patient ID: Helen Mahoney, female    DOB: 1951/04/15, 61 y.o.   MRN: [id number]  HPI The PT is here for follow up and re-evaluation of chronic medical conditions, medication management and review of any available recent lab and radiology data.  Preventive health is updated, specifically  Cancer screening and Immunization.   Questions or concerns regarding consultations or procedures which the PT has had in the interim are  addressed. The PT denies any adverse reactions to current medications since the last visit.  There are no new concerns.  There are no specific complaints  Fasting sugars average between 110 to 120, not walking regularly, busy raising grand children     Review of Systems See HPI Denies recent fever or chills. Denies sinus pressure, nasal congestion, ear pain or sore throat. Denies chest congestion, productive cough or wheezing. Denies chest pains, palpitations and leg swelling Denies abdominal pain, nausea, vomiting,diarrhea or constipation.   Denies dysuria, frequency, hesitancy or incontinence. Denies joint pain, swelling and limitation in mobility. Denies headaches, seizures, numbness, or tingling. Denies depression, anxiety or insomnia. Denies skin break down or rash.        Objective:   Physical Exam Patient alert and oriented and in no cardiopulmonary distress.  HEENT: No facial asymmetry, EOMI, no sinus tenderness,  oropharynx pink and moist.  Neck supple no adenopathy.  Chest: Clear to auscultation bilaterally.  CVS: S1, S2 no murmurs, no S3.  ABD: Soft non tender. Bowel sounds normal.  Ext: No edema  MS: Adequate ROM spine, shoulders, hips and knees.  Skin: Intact, no ulcerations or rash noted.  Psych: Good eye contact, normal affect. Memory intact not anxious or depressed appearing.  CNS: CN 2-12 intact, power, tone and sensation normal throughout. Diabetic Foot Check:  Appearance - no lesions, ulcers or calluses.  [place] bilaterally Skin - no unusual pallor or redness Sensation - grossly intact to light touch Monofilament testing -  Right - Great toe, medial, central, lateral ball and posterior foot intact Left - Great toe, medial, central, lateral ball and posterior foot intact Pulses Left - Dorsalis Pedis and Posterior Tibia normal Right - Dorsalis Pedis and Posterior Tibia normal        Assessment & Plan:

## 2011-10-15 NOTE — Assessment & Plan Note (Signed)
Controlled, no change in medication  

## 2011-10-15 NOTE — Patient Instructions (Addendum)
cPE October 20 or after  Blood pressure and cholesterol are excellent.Blood sugar has improved,but is still not at goal.  You need to walk regularly  No med changes  HBA1C and chem 7 non fasting end September

## 2011-10-15 NOTE — Assessment & Plan Note (Signed)
Unchanged. Patient re-educated about  the importance of commitment to a  minimum of 150 minutes of exercise per week. The importance of healthy food choices with portion control discussed. Encouraged to start a food diary, count calories and to consider  joining a support group. Sample diet sheets offered. Goals set by the patient for the next several months.    

## 2011-10-15 NOTE — Assessment & Plan Note (Signed)
Improved, no med change 

## 2012-01-17 LAB — BASIC METABOLIC PANEL
CO2: 24 mEq/L (ref 19–32)
Calcium: 9 mg/dL (ref 8.4–10.5)
Chloride: 106 mEq/L (ref 96–112)
Sodium: 140 mEq/L (ref 135–145)

## 2012-01-17 LAB — HEMOGLOBIN A1C: Mean Plasma Glucose: 160 mg/dL — ABNORMAL HIGH (ref <117)

## 2012-01-28 IMAGING — MG MM DIGITAL SCREENING
4 series · 4 of 4 positions shown · non-contrast
Comparison: Prior studies.

DG SCREEN MAMMOGRAM BILATERAL
Bilateral CC and MLO view(s) were taken.

DIGITAL SCREENING MAMMOGRAM WITH CAD:

[L CC]
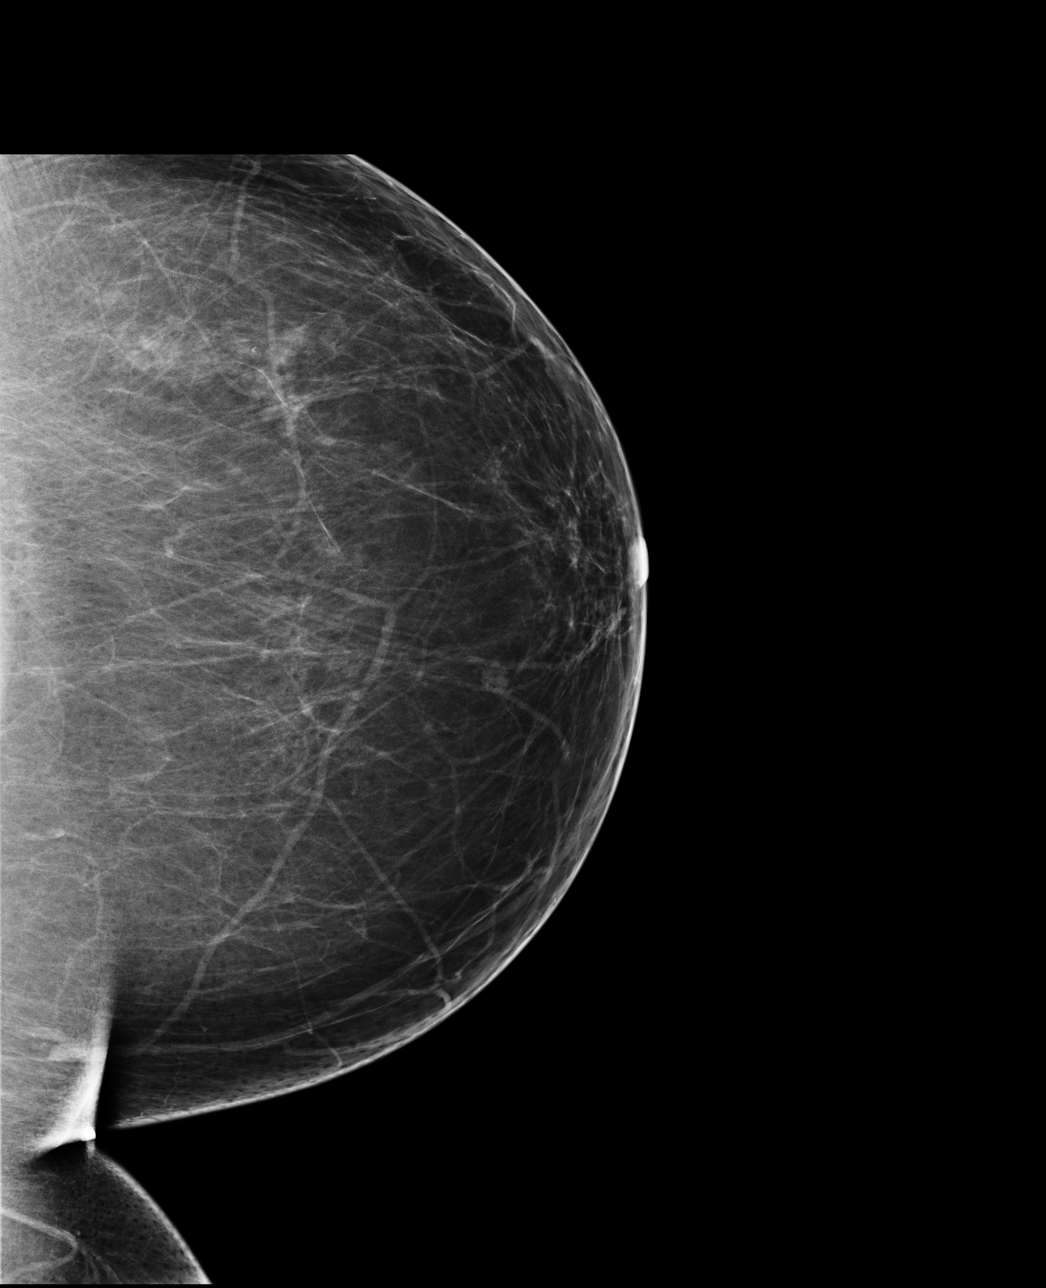

[L MLO]
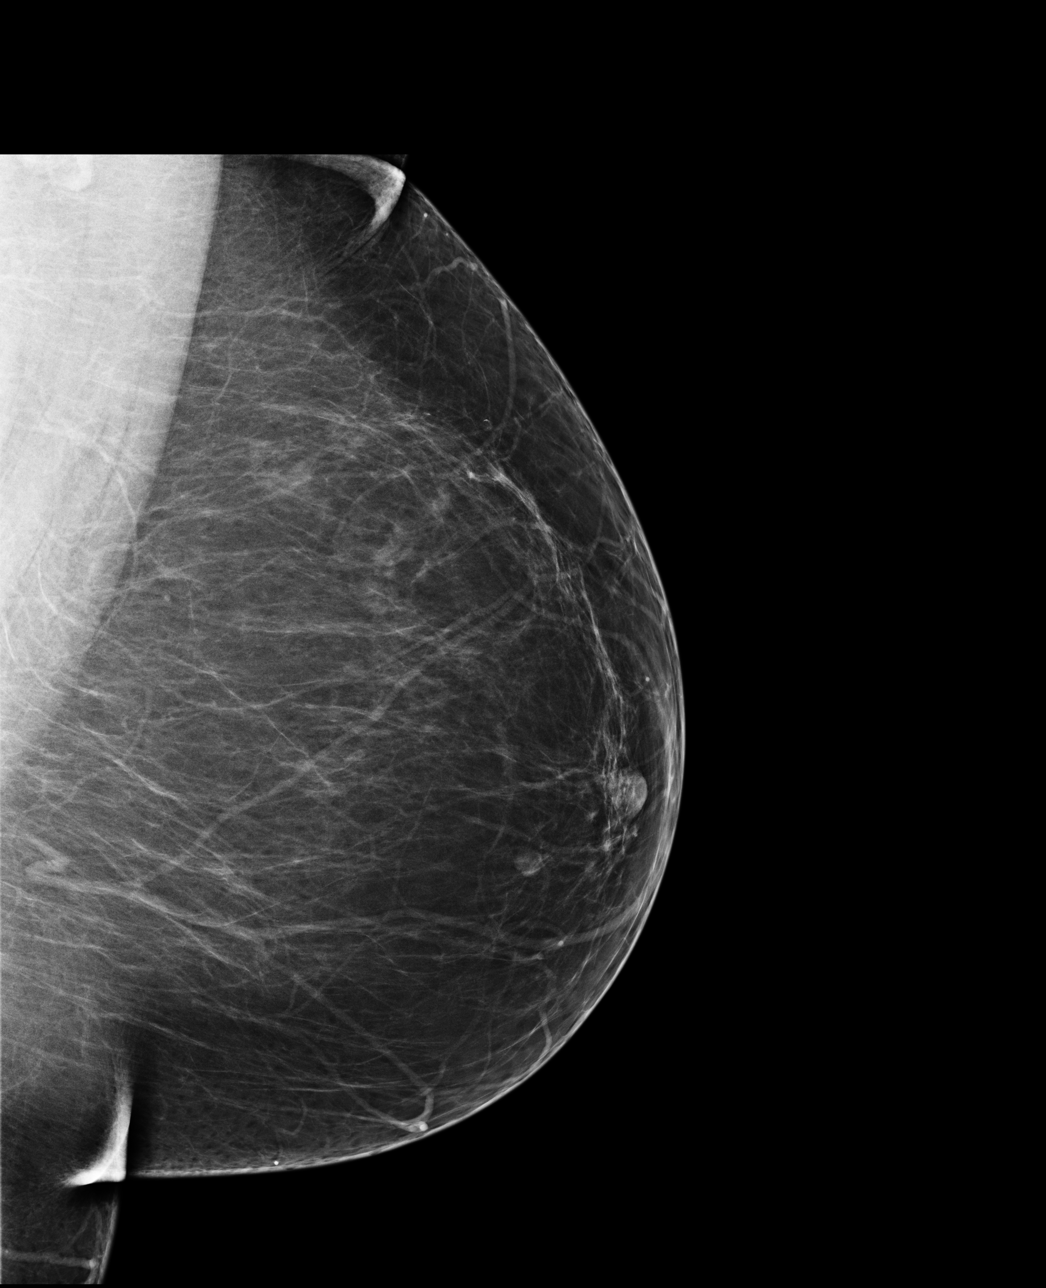

[R CC]
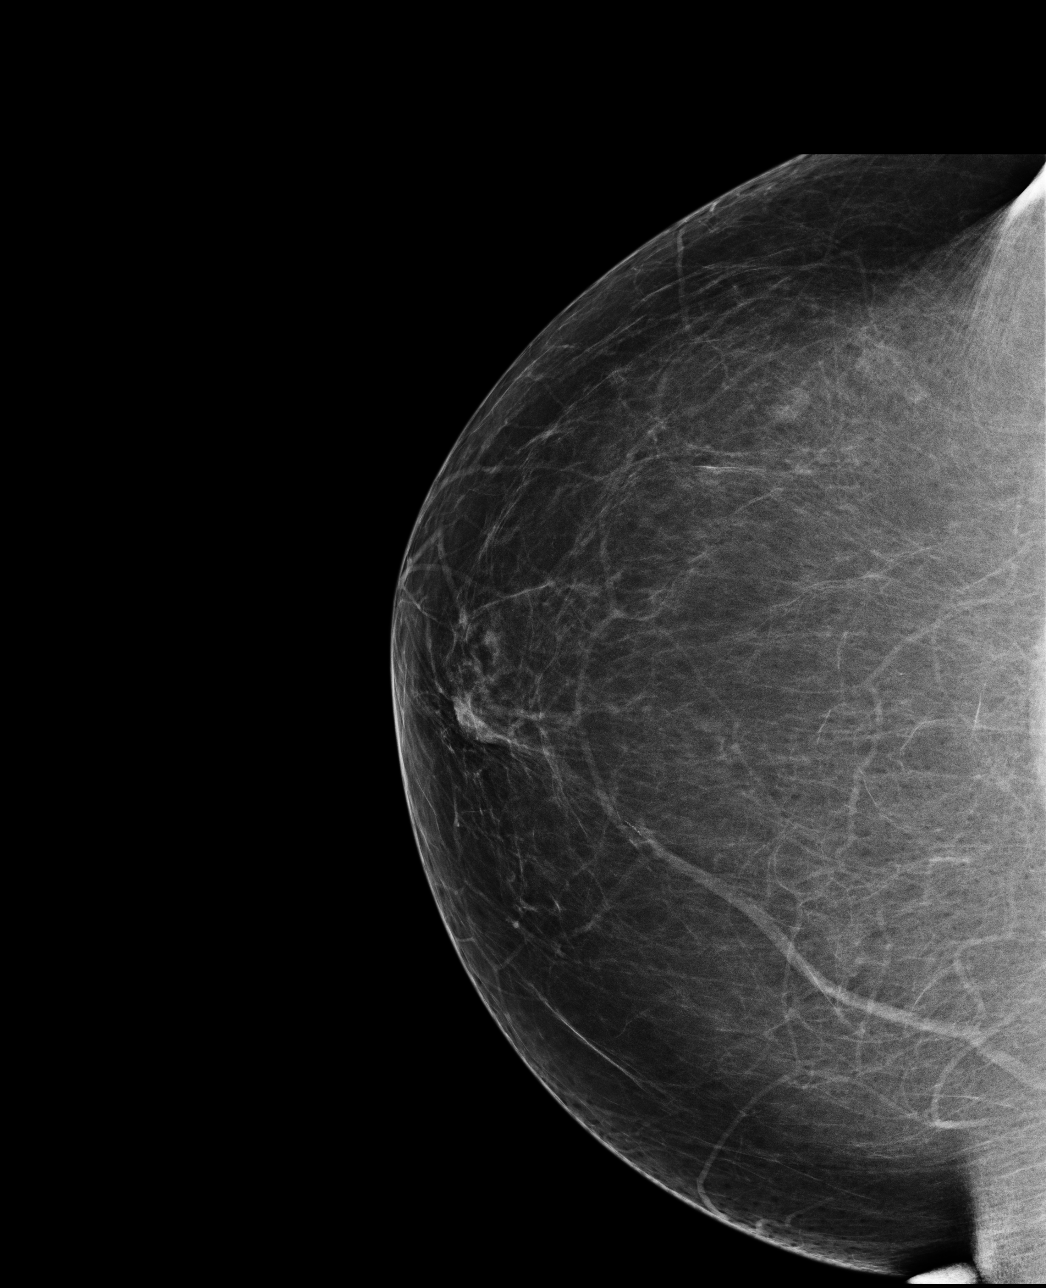

[R MLO]
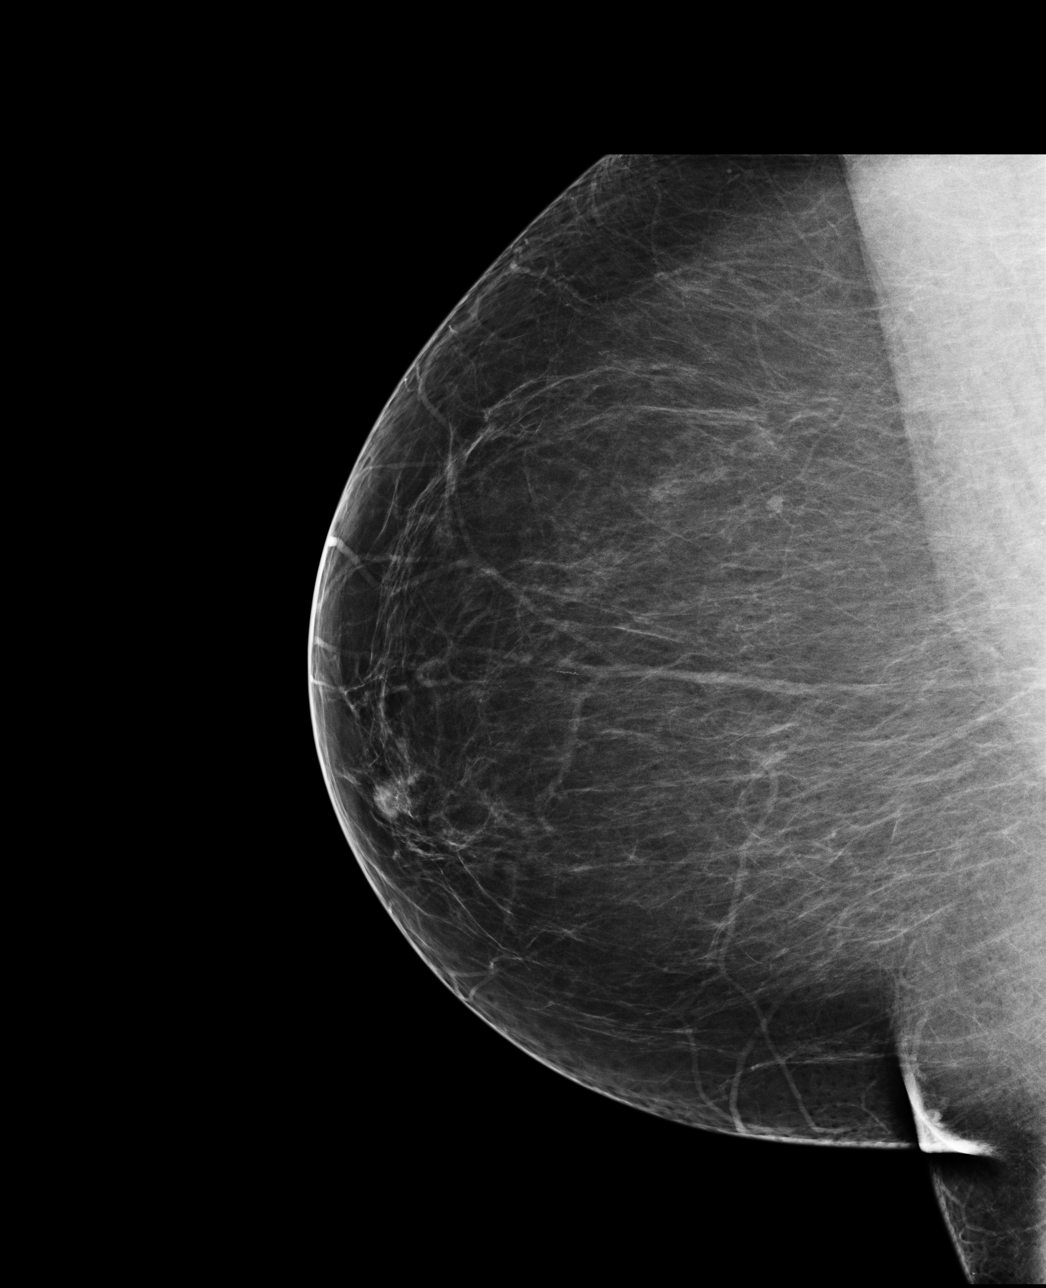

[4 of 4 positions shown; findings below may reference images not displayed]

The breast tissue is almost entirely fatty.  There is no dominant mass, architectural distortion or
calcification to suggest malignancy.

Images were processed with CAD.
IMPRESSION: No mammographic evidence of malignancy.  Suggest yearly screening mammography.

A result letter of this screening mammogram will be mailed directly to the patient.

ASSESSMENT: Negative - BI-RADS 1

Screening mammogram in 1 year.
,

## 2012-02-03 ENCOUNTER — Other Ambulatory Visit: Payer: Self-pay | Admitting: Family Medicine

## 2012-02-03 DIAGNOSIS — Z139 Encounter for screening, unspecified: Secondary | ICD-10-CM

## 2012-02-06 ENCOUNTER — Ambulatory Visit (HOSPITAL_COMMUNITY)
Admission: RE | Admit: 2012-02-06 | Discharge: 2012-02-06 | Disposition: A | Discharge status: Home / Self Care | Payer: BC Managed Care – PPO | Source: Ambulatory Visit | Attending: Family Medicine | Admitting: Family Medicine

## 2012-02-06 DIAGNOSIS — Z1231 Encounter for screening mammogram for malignant neoplasm of breast: Secondary | ICD-10-CM | POA: Insufficient documentation

## 2012-02-06 DIAGNOSIS — Z139 Encounter for screening, unspecified: Secondary | ICD-10-CM

## 2012-02-14 ENCOUNTER — Other Ambulatory Visit: Payer: Self-pay | Admitting: Family Medicine

## 2012-02-17 ENCOUNTER — Ambulatory Visit: Payer: BC Managed Care – PPO | Admitting: Family Medicine

## 2012-03-18 ENCOUNTER — Other Ambulatory Visit (HOSPITAL_COMMUNITY)
Admission: RE | Admit: 2012-03-18 | Discharge: 2012-03-18 | Disposition: A | Discharge status: Home / Self Care | Payer: BC Managed Care – PPO | Source: Ambulatory Visit | Attending: Family Medicine | Admitting: Family Medicine

## 2012-03-18 ENCOUNTER — Encounter: Payer: Self-pay | Admitting: Family Medicine

## 2012-03-18 ENCOUNTER — Ambulatory Visit (INDEPENDENT_AMBULATORY_CARE_PROVIDER_SITE_OTHER): Payer: BC Managed Care – PPO | Admitting: Family Medicine

## 2012-03-18 VITALS — BP 130/82 | HR 80 | Resp 15 | Ht 66.0 in | Wt 251.0 lb

## 2012-03-18 DIAGNOSIS — Z1151 Encounter for screening for human papillomavirus (HPV): Secondary | ICD-10-CM | POA: Insufficient documentation

## 2012-03-18 DIAGNOSIS — E669 Obesity, unspecified: Secondary | ICD-10-CM

## 2012-03-18 DIAGNOSIS — E119 Type 2 diabetes mellitus without complications: Secondary | ICD-10-CM

## 2012-03-18 DIAGNOSIS — R5383 Other fatigue: Secondary | ICD-10-CM

## 2012-03-18 DIAGNOSIS — Z0000 Encounter for general adult medical examination without abnormal findings: Secondary | ICD-10-CM

## 2012-03-18 DIAGNOSIS — Z01419 Encounter for gynecological examination (general) (routine) without abnormal findings: Secondary | ICD-10-CM | POA: Insufficient documentation

## 2012-03-18 DIAGNOSIS — Z124 Encounter for screening for malignant neoplasm of cervix: Secondary | ICD-10-CM

## 2012-03-18 DIAGNOSIS — E785 Hyperlipidemia, unspecified: Secondary | ICD-10-CM

## 2012-03-18 DIAGNOSIS — R5381 Other malaise: Secondary | ICD-10-CM

## 2012-03-18 DIAGNOSIS — I10 Essential (primary) hypertension: Secondary | ICD-10-CM

## 2012-03-18 MED ORDER — GLIPIZIDE 10 MG PO TABS: 10.0000 mg | ORAL_TABLET | Freq: Two times a day (BID) | ORAL | Status: DC

## 2012-03-18 MED ORDER — PRAVASTATIN SODIUM 80 MG PO TABS: 80.0000 mg | ORAL_TABLET | Freq: Every evening | ORAL | Status: DC

## 2012-03-18 MED ORDER — METFORMIN HCL 1000 MG PO TABS: 1000.0000 mg | ORAL_TABLET | Freq: Two times a day (BID) | ORAL | Status: DC

## 2012-03-18 NOTE — Patient Instructions (Addendum)
F/U in May, please call if you need me before.  Please get the flu vaccine at the pharmacy today.  Please schedule your eye exam  Fasting lipid, cmp and EGFR and HBa1C and CBC January 20 or after. Please call 2 days after labs for results.  It is important that you exercise regularly at least 30 minutes 5 times a week. If you develop chest pain, have severe difficulty breathing, or feel very tired, stop exercising immediately and seek medical attention   Goal for fasting blood sugar ranges from 80 to 120 and 2 hours after any meal or at bedtime should be between 130 to 170.

## 2012-03-18 NOTE — Assessment & Plan Note (Signed)
Annual exam completed and documented. Pap sent, pt has had a colonoscopy and mammogram this year Still needs to commit to daily exercise , dietary change and weight loss Will get the flu vaccine at pharmacy

## 2012-03-18 NOTE — Progress Notes (Signed)
  Subjective:    Patient ID: Helen Mahoney, female    DOB: 08-02-1950, 61 y.o.   MRN: [id number]  HPI The PT is here for annual exam .  Preventive health is updated, specifically  Cancer screening and Immunization.   Questions or concerns regarding consultations or procedures which the PT has had in the interim are  addressed. The PT denies any adverse reactions to current medications since the last visit.  There are no new concerns. Her eye exam is due and she will schedule this There are no specific complaints       Review of Systems See HPI Denies recent fever or chills. Denies sinus pressure, nasal congestion, ear pain or sore throat. Denies chest congestion, productive cough or wheezing. Denies chest pains, palpitations and leg swelling Denies abdominal pain, nausea, vomiting,diarrhea or constipation.   Denies dysuria, frequency, hesitancy or incontinence. Denies joint pain, swelling and limitation in mobility. Denies headaches, seizures, numbness, or tingling. Denies depression, anxiety or insomnia. Denies skin break down or rash.        Objective:   Physical Exam  Pleasant well nourished female, alert and oriented x 3, in no cardio-pulmonary distress. Afebrile. HEENT No facial trauma or asymetry. Sinuses non tender.  EOMI, PERTL, fundoscopic , no hemorhage or exudate.  External ears normal, tympanic membranes clear. Oropharynx moist, no exudate, fair  dentition. Neck: supple, no adenopathy,JVD or thyromegaly.No bruits.  Chest: Clear to ascultation bilaterally.No crackles or wheezes. Non tender to palpation  Breast: No asymetry,no masses. No nipple discharge or inversion. No axillary or supraclavicular adenopathy  Cardiovascular system; Heart sounds normal,  S1 and  S2 ,no S3.  No murmur, or thrill. Apical beat not displaced Peripheral pulses normal.  Abdomen: Soft, non tender, no organomegaly or masses. No bruits. Bowel sounds normal. No  guarding, tenderness or rebound.  Rectal:  Not done , pt had a colonoscopy ni January of this year  GU: External genitalia normal. No lesions. Vaginal canal normal.fishy discharge. Uterus normal size, no adnexal masses, no cervical motion or adnexal tenderness.  Musculoskeletal exam: Full ROM of spine, hips , shoulders and knees. No deformity ,swelling or crepitus noted. No muscle wasting or atrophy.   Neurologic: Cranial nerves 2 to 12 intact. Power, tone ,sensation and reflexes normal throughout. No disturbance in gait. No tremor.  Skin: Intact, no ulceration, erythema , scaling or rash noted. Pigmentation normal throughout  Psych; Normal mood and affect. Judgement and concentration normal  Diabetic Foot Check:  Appearance - no lesions, ulcers or calluses Skin - no unusual pallor or redness Sensation - grossly intact to light touch Monofilament testing -  Right - Great toe, medial, central, lateral ball and posterior foot intact Left - Great toe, medial, central, lateral ball and posterior foot intact Pulses Left - Dorsalis Pedis and Posterior Tibia normal Right - Dorsalis Pedis and Posterior Tibia normal      Assessment & Plan:

## 2012-06-18 ENCOUNTER — Other Ambulatory Visit: Payer: Self-pay | Admitting: Family Medicine

## 2012-09-08 ENCOUNTER — Other Ambulatory Visit: Payer: Self-pay | Admitting: Family Medicine

## 2012-09-09 LAB — CBC WITH DIFFERENTIAL/PLATELET
Basophils Relative: 0 % (ref 0–1)
Eosinophils Absolute: 0.2 10*3/uL (ref 0.0–0.7)
Hemoglobin: 9.7 g/dL — ABNORMAL LOW (ref 12.0–15.0)
Lymphs Abs: 1.6 10*3/uL (ref 0.7–4.0)
MCH: 19.8 pg — ABNORMAL LOW (ref 26.0–34.0)
MCHC: 29.2 g/dL — ABNORMAL LOW (ref 30.0–36.0)
Monocytes Relative: 6 % (ref 3–12)
Neutro Abs: 4.7 10*3/uL (ref 1.7–7.7)
Neutrophils Relative %: 68 % (ref 43–77)
Platelets: 349 10*3/uL (ref 150–400)
RBC: 4.91 MIL/uL (ref 3.87–5.11)

## 2012-09-09 LAB — LIPID PANEL
Cholesterol: 159 mg/dL (ref 0–200)
Triglycerides: 82 mg/dL (ref <150)
VLDL: 16 mg/dL (ref 0–40)

## 2012-09-09 LAB — COMPLETE METABOLIC PANEL WITH GFR
CO2: 27 mEq/L (ref 19–32)
Calcium: 9.2 mg/dL (ref 8.4–10.5)
Chloride: 105 mEq/L (ref 96–112)
Creat: 0.86 mg/dL (ref 0.50–1.10)
GFR, Est African American: 84 mL/min
GFR, Est Non African American: 73 mL/min
Glucose, Bld: 86 mg/dL (ref 70–99)
Total Bilirubin: 0.3 mg/dL (ref 0.3–1.2)

## 2012-09-09 LAB — HEMOGLOBIN A1C: Mean Plasma Glucose: 183 mg/dL — ABNORMAL HIGH (ref <117)

## 2012-09-09 LAB — IRON: Iron: 52 ug/dL (ref 42–145)

## 2012-09-17 ENCOUNTER — Encounter: Payer: Self-pay | Admitting: Family Medicine

## 2012-09-17 ENCOUNTER — Ambulatory Visit (INDEPENDENT_AMBULATORY_CARE_PROVIDER_SITE_OTHER): Payer: BC Managed Care – PPO | Admitting: Family Medicine

## 2012-09-17 VITALS — BP 122/80 | HR 99 | Resp 16 | Ht 66.0 in | Wt 245.8 lb

## 2012-09-17 DIAGNOSIS — E785 Hyperlipidemia, unspecified: Secondary | ICD-10-CM

## 2012-09-17 DIAGNOSIS — E119 Type 2 diabetes mellitus without complications: Secondary | ICD-10-CM

## 2012-09-17 DIAGNOSIS — E669 Obesity, unspecified: Secondary | ICD-10-CM

## 2012-09-17 DIAGNOSIS — IMO0001 Reserved for inherently not codable concepts without codable children: Secondary | ICD-10-CM

## 2012-09-17 DIAGNOSIS — I10 Essential (primary) hypertension: Secondary | ICD-10-CM

## 2012-09-17 DIAGNOSIS — E1065 Type 1 diabetes mellitus with hyperglycemia: Secondary | ICD-10-CM

## 2012-09-17 MED ORDER — INSULIN DETEMIR 100 UNIT/ML FLEXPEN: 25.0000 [IU] | PEN_INJECTOR | Freq: Every day | SUBCUTANEOUS | Status: DC

## 2012-09-17 MED ORDER — LISINOPRIL 20 MG PO TABS: ORAL_TABLET | ORAL | Status: DC

## 2012-09-17 MED ORDER — METFORMIN HCL 1000 MG PO TABS: 1000.0000 mg | ORAL_TABLET | Freq: Two times a day (BID) | ORAL | Status: DC

## 2012-09-17 MED ORDER — GLIPIZIDE 10 MG PO TABS: 10.0000 mg | ORAL_TABLET | Freq: Two times a day (BID) | ORAL | Status: DC

## 2012-09-17 MED ORDER — PRAVASTATIN SODIUM 80 MG PO TABS: 80.0000 mg | ORAL_TABLET | Freq: Every evening | ORAL | Status: DC

## 2012-09-17 NOTE — Patient Instructions (Addendum)
F/u in 3 month  Dose increase in levemir to 15 units daily for 1 week , if blood sugar values are still high, after 1 week increase to 20 units, again, if still high in week 3 , go to 25 units. New script says 25 units daily. Call with questions Test at least twice daily and record in book you are given  Goal for fasting blood sugar ranges from 80 to 120 and 2 hours after any meal or at bedtime should be between 130 to 170.   Commit to THREE 10 minute walking sessions in the home every day please  HBA1C andchem 7 in 3 month, blood sugar NEEDS to improve

## 2012-09-17 NOTE — Progress Notes (Signed)
  Subjective:    Patient ID: Helen Mahoney, female    DOB: 10/22/1950, 62 y.o.   MRN: [id number]  HPI The PT is here for follow up and re-evaluation of chronic medical conditions, medication management and review of any available recent lab and radiology data.  Preventive health is updated, specifically  Cancer screening and Immunization.   Questions or concerns regarding consultations or procedures which the PT has had in the interim are  addressed. The PT denies any adverse reactions to current medications since the last visit.  There are no new concerns.  There are no specific complaints       Review of Systems See HPI Denies recent fever or chills. Denies sinus pressure, nasal congestion, ear pain or sore throat. Denies chest congestion, productive cough or wheezing. Denies chest pains, palpitations and leg swelling Denies abdominal pain, nausea, vomiting,diarrhea or constipation.   Denies dysuria, frequency, hesitancy or incontinence. Denies joint pain, swelling and limitation in mobility. Denies headaches, seizures, numbness, or tingling. Denies depression, anxiety or insomnia. Denies skin break down or rash.        Objective:   Physical Exam  Patient alert and oriented and in no cardiopulmonary distress.  HEENT: No facial asymmetry, EOMI, no sinus tenderness,  oropharynx pink and moist.  Neck supple no adenopathy.  Chest: Clear to auscultation bilaterally.  CVS: S1, S2 no murmurs, no S3.  ABD: Soft non tender. Bowel sounds normal.  Ext: No edema  MS: Adequate ROM spine, shoulders, hips and knees.  Skin: Intact, no ulcerations or rash noted.  Psych: Good eye contact, normal affect. Memory intact not anxious or depressed appearing.  CNS: CN 2-12 intact, power, tone and sensation normal throughout.       Assessment & Plan:

## 2012-09-19 LAB — MICROALBUMIN / CREATININE URINE RATIO: Microalb, Ur: 1.36 mg/dL (ref 0.00–1.89)

## 2012-09-21 NOTE — Assessment & Plan Note (Signed)
Controlled, no change in medication DASH diet and commitment to daily physical activity for a minimum of 30 minutes discussed and encouraged, as a part of hypertension management. The importance of attaining a healthy weight is also discussed.  

## 2012-09-21 NOTE — Assessment & Plan Note (Signed)
Deteriorated, non compliance with diet as well as n testing continue to be major issues, pt also not exercising. Med increased and behav modification stressed

## 2012-09-21 NOTE — Assessment & Plan Note (Signed)
Unchanged. Patient re-educated about  the importance of commitment to a  minimum of 150 minutes of exercise per week. The importance of healthy food choices with portion control discussed. Encouraged to start a food diary, count calories and to consider  joining a support group. Sample diet sheets offered. Goals set by the patient for the next several months.    

## 2012-09-21 NOTE — Assessment & Plan Note (Signed)
Controlled, no change in medication Hyperlipidemia:Low fat diet discussed and encouraged.  \ 

## 2012-12-11 ENCOUNTER — Other Ambulatory Visit: Payer: Self-pay

## 2012-12-11 MED ORDER — INSULIN DETEMIR 100 UNIT/ML FLEXPEN: 25.0000 [IU] | PEN_INJECTOR | Freq: Every day | SUBCUTANEOUS | Status: DC

## 2012-12-16 ENCOUNTER — Other Ambulatory Visit: Payer: Self-pay | Admitting: Family Medicine

## 2012-12-16 LAB — BASIC METABOLIC PANEL
CO2: 24 mEq/L (ref 19–32)
Calcium: 8.9 mg/dL (ref 8.4–10.5)
Creat: 0.86 mg/dL (ref 0.50–1.10)
Glucose, Bld: 72 mg/dL (ref 70–99)

## 2012-12-21 ENCOUNTER — Encounter: Payer: Self-pay | Admitting: Family Medicine

## 2012-12-21 ENCOUNTER — Ambulatory Visit (INDEPENDENT_AMBULATORY_CARE_PROVIDER_SITE_OTHER): Payer: BC Managed Care – PPO | Admitting: Family Medicine

## 2012-12-21 ENCOUNTER — Other Ambulatory Visit: Payer: Self-pay

## 2012-12-21 VITALS — BP 106/68 | HR 95 | Resp 18 | Ht 66.0 in | Wt 247.1 lb

## 2012-12-21 DIAGNOSIS — E785 Hyperlipidemia, unspecified: Secondary | ICD-10-CM

## 2012-12-21 DIAGNOSIS — E669 Obesity, unspecified: Secondary | ICD-10-CM

## 2012-12-21 DIAGNOSIS — E1065 Type 1 diabetes mellitus with hyperglycemia: Secondary | ICD-10-CM

## 2012-12-21 DIAGNOSIS — Z1211 Encounter for screening for malignant neoplasm of colon: Secondary | ICD-10-CM

## 2012-12-21 DIAGNOSIS — I10 Essential (primary) hypertension: Secondary | ICD-10-CM

## 2012-12-21 DIAGNOSIS — E119 Type 2 diabetes mellitus without complications: Secondary | ICD-10-CM

## 2012-12-21 MED ORDER — METFORMIN HCL 1000 MG PO TABS: 1000.0000 mg | ORAL_TABLET | Freq: Two times a day (BID) | ORAL | Status: DC

## 2012-12-21 MED ORDER — GLIPIZIDE 10 MG PO TABS: 10.0000 mg | ORAL_TABLET | Freq: Two times a day (BID) | ORAL | Status: DC

## 2012-12-21 MED ORDER — INSULIN PEN NEEDLE 31G X 8 MM MISC: Status: DC

## 2012-12-21 MED ORDER — PRAVASTATIN SODIUM 80 MG PO TABS: 80.0000 mg | ORAL_TABLET | Freq: Every evening | ORAL | Status: DC

## 2012-12-21 MED ORDER — LISINOPRIL 20 MG PO TABS: ORAL_TABLET | ORAL | Status: DC

## 2012-12-21 NOTE — Assessment & Plan Note (Signed)
Unchanged. Patient re-educated about  the importance of commitment to a  minimum of 150 minutes of exercise per week. The importance of healthy food choices with portion control discussed. Encouraged to start a food diary, count calories and to consider  joining a support group. Sample diet sheets offered. Goals set by the patient for the next several months.    

## 2012-12-21 NOTE — Assessment & Plan Note (Signed)
Controlled, no change in medication DASH diet and commitment to daily physical activity for a minimum of 30 minutes discussed and encouraged, as a part of hypertension management. The importance of attaining a healthy weight is also discussed.  

## 2012-12-21 NOTE — Patient Instructions (Addendum)
F/u early December, call if you need me before.  Call for flu vaccine in October please  INCREASE levimir to 25 units daily  Start dancing for 15 minutes every day  Cut back more on sweets  Rectal today  pls schedule mammogram due by October 20  Fasting lipid, cmp and EGFR and HBa1C early December approx 3 days before visit

## 2012-12-21 NOTE — Assessment & Plan Note (Signed)
Slight improvemnt , still not at goal. Pt encouraged again to start daily physical activity as well as to focus on low carb intake Increase levemir to 25 units daily Patient advised to reduce carb and sweets, commit to regular physical activity, take meds as prescribed, test blood as directed, and attempt to lose weight, to improve blood sugar control.

## 2012-12-21 NOTE — Progress Notes (Signed)
  Subjective:    Patient ID: Helen Mahoney, female    DOB: Sep 06, 1950, 62 y.o.   MRN: [id number]  HPI The PT is here for follow up and re-evaluation of chronic medical conditions, medication management and review of any available recent lab and radiology data.  Preventive health is updated, specifically  Cancer screening and Immunization.Thinks she had a mammogram this year, none on record  The PT denies any adverse reactions to current medications since the last visit.  There are no new concerns. Still not exercising, has cut back on sugar, blood sugar marginally improved, levemir still under dosed. Tests once daily has fasting sugars ranging from70' There are no specific complaints       Review of Systems See HPI Denies recent fever or chills. Denies sinus pressure, nasal congestion, ear pain or sore throat. Denies chest congestion, productive cough or wheezing. Denies chest pains, palpitations and leg swelling Denies abdominal pain, nausea, vomiting,diarrhea or constipation.   Denies dysuria, frequency, hesitancy or incontinence. Denies joint pain, swelling and limitation in mobility. Denies headaches, seizures, numbness, or tingling. Denies depression, anxiety or insomnia. Denies skin break down or rash.        Objective:   Physical Exam  Patient alert and oriented and in no cardiopulmonary distress.  HEENT: No facial asymmetry, EOMI, no sinus tenderness,  oropharynx pink and moist.  Neck supple no adenopathy.  Chest: Clear to auscultation bilaterally.  CVS: S1, S2 no murmurs, no S3.  ABD: Soft non tender. Bowel sounds normal. Rectal : no mass, heme negative stool Ext: No edema  MS: Adequate ROM spine, shoulders, hips and knees.  Skin: Intact, no ulcerations or rash noted.  Psych: Good eye contact, normal affect. Memory intact not anxious or depressed appearing.  CNS: CN 2-12 intact, power, tone and sensation normal throughout.       Assessment &  Plan:

## 2012-12-21 NOTE — Assessment & Plan Note (Signed)
Hyperlipidemia:Low fat diet discussed and encouraged.  Updated lab next vsit

## 2013-01-12 ENCOUNTER — Other Ambulatory Visit: Payer: Self-pay | Admitting: Family Medicine

## 2013-01-12 DIAGNOSIS — Z139 Encounter for screening, unspecified: Secondary | ICD-10-CM

## 2013-01-30 IMAGING — MG MM DIGITAL SCREENING {APH}
4 series · 4 of 4 positions shown · non-contrast
Comparison: none

DG SCREEN MAMMOGRAM BILATERAL
Bilateral CC and MLO view(s) were taken.

DIGITAL SCREENING MAMMOGRAM WITH CAD:
The breast tissue is almost entirely fatty.  No masses or malignant type calcifications are 
identified.  Compared with prior studies.
Images were processed with CAD.

[L CC]
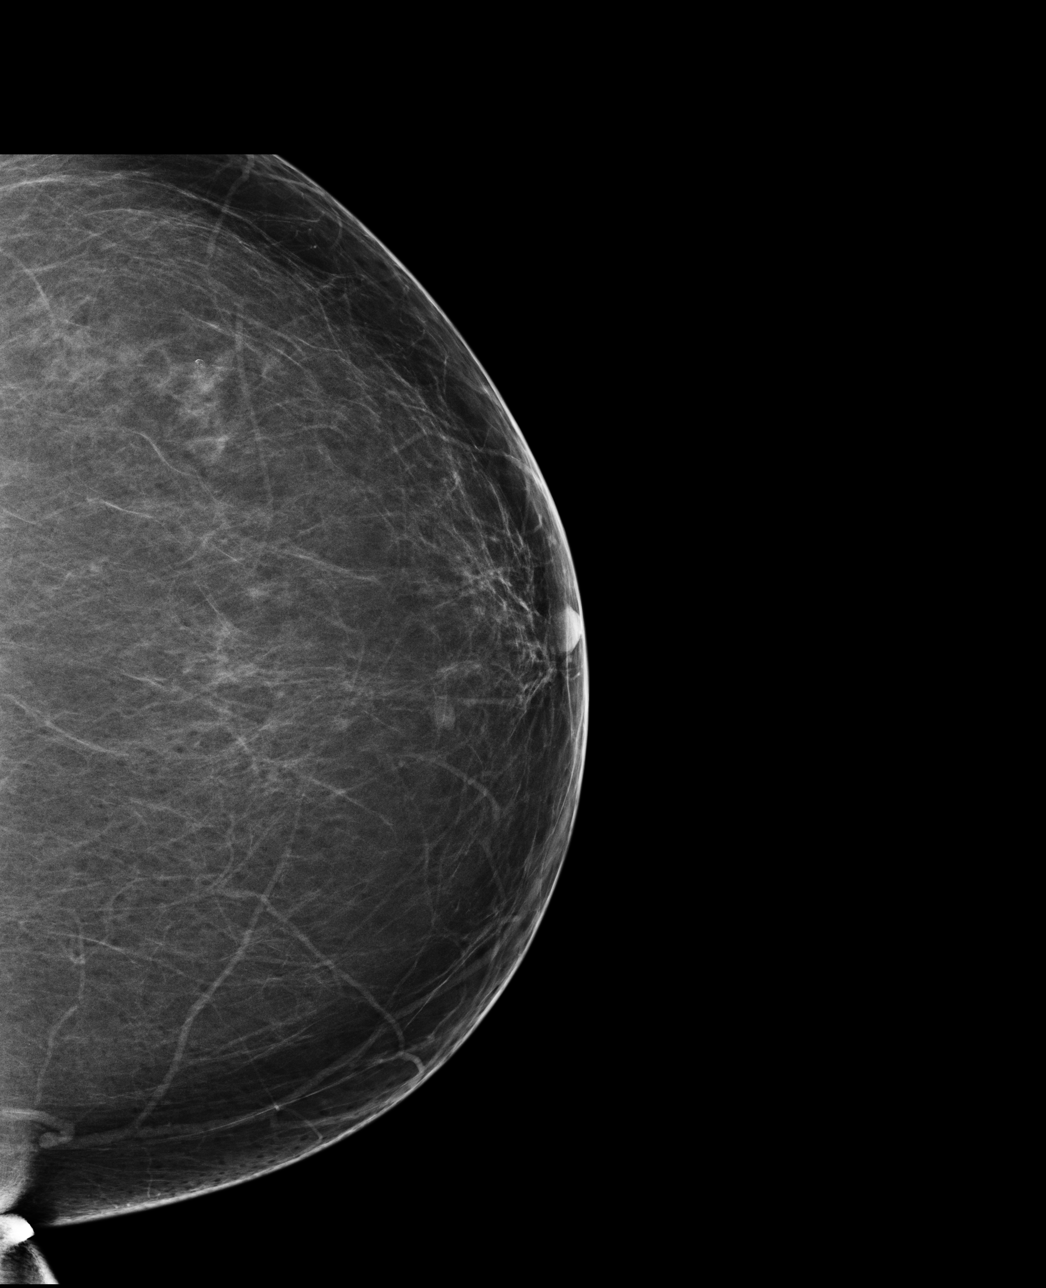

[L MLO]
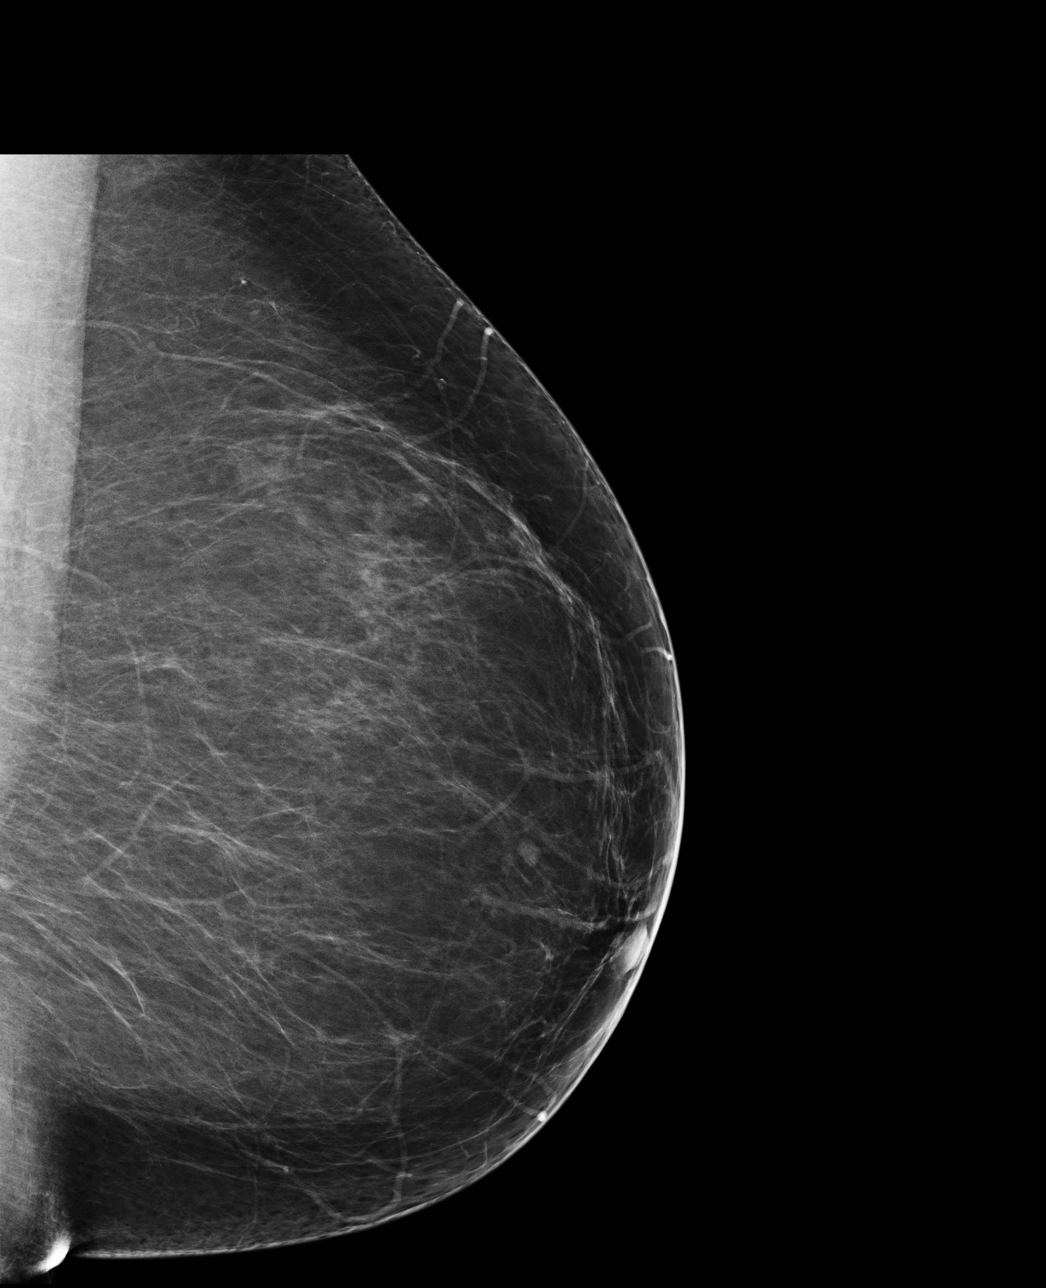

[R CC]
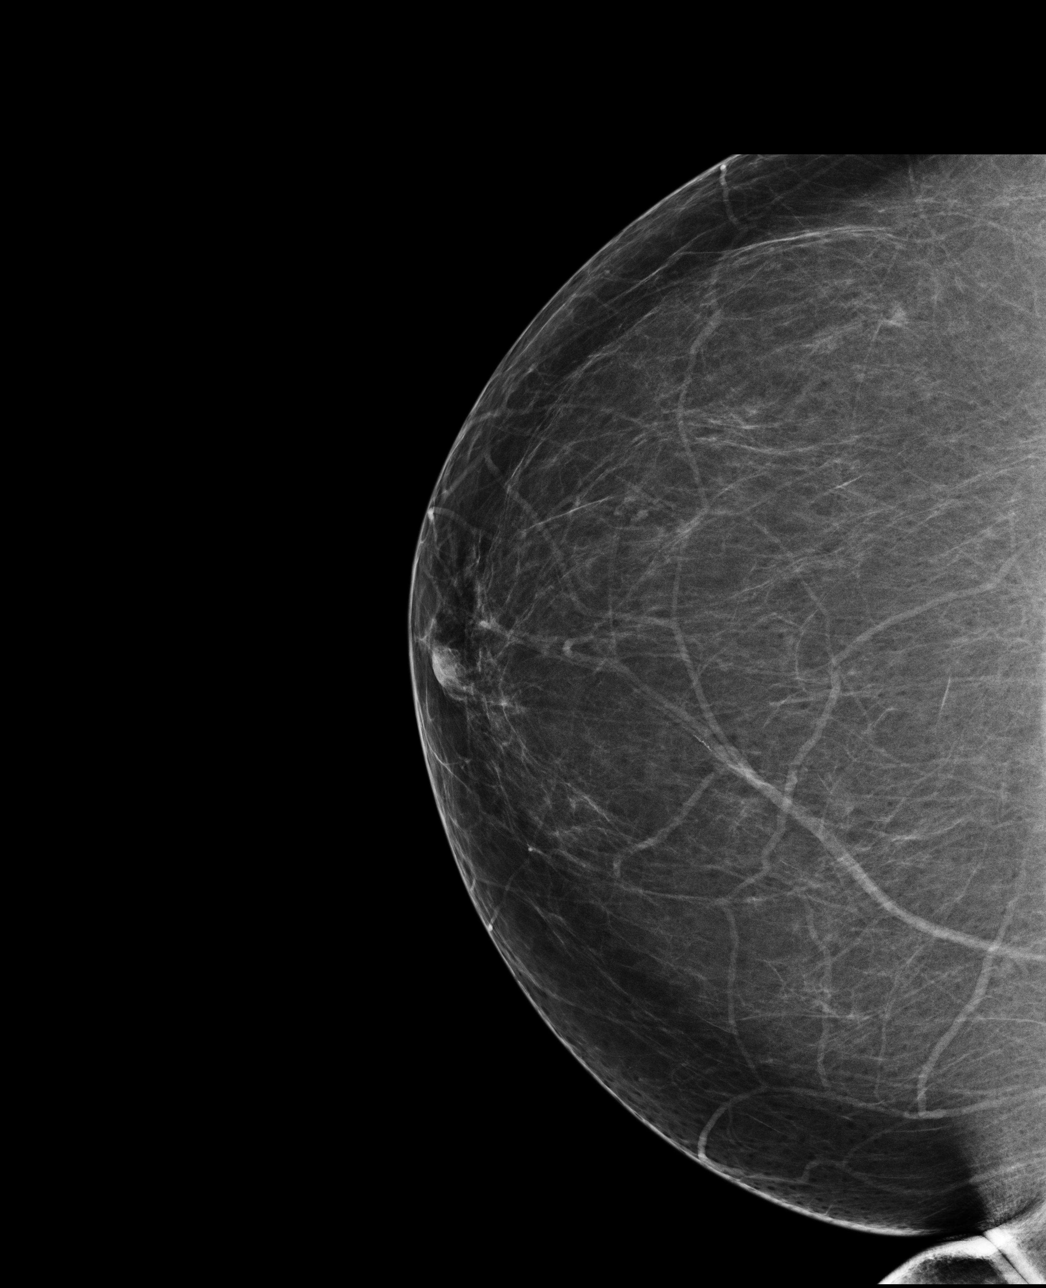

[R MLO]
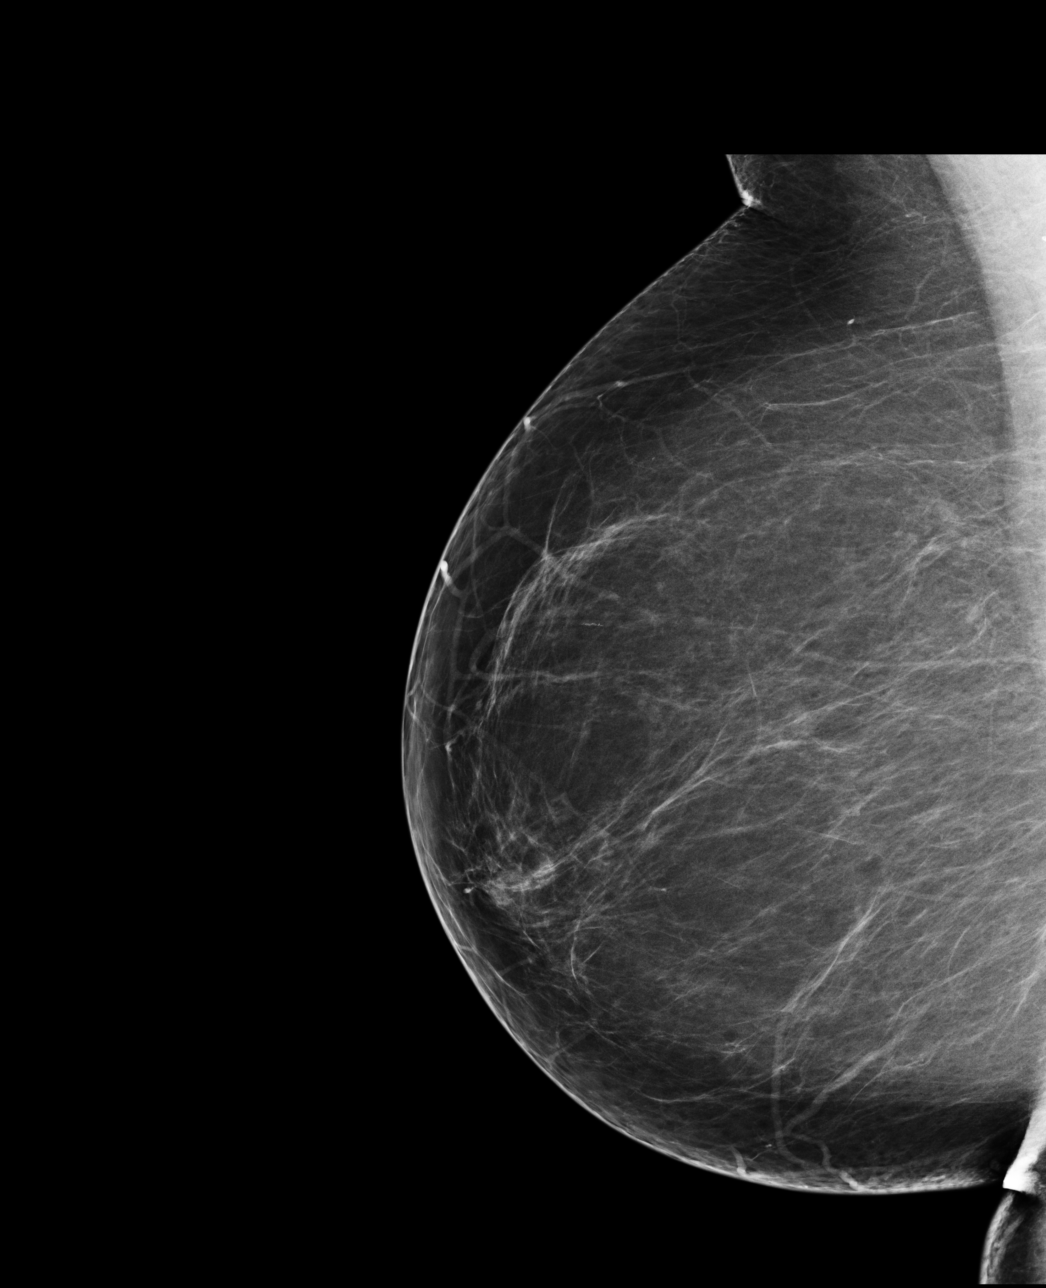

[4 of 4 positions shown; findings below may reference images not displayed]

IMPRESSION: No specific mammographic evidence of malignancy.  Next screening mammogram is recommended in one 
year.

A result letter of this screening mammogram will be mailed directly to the patient.

ASSESSMENT: Negative - BI-RADS 1

Screening mammogram in 1 year.
,

## 2013-02-08 ENCOUNTER — Ambulatory Visit (HOSPITAL_COMMUNITY)
Admission: RE | Admit: 2013-02-08 | Discharge: 2013-02-08 | Disposition: A | Discharge status: Home / Self Care | Payer: BC Managed Care – PPO | Source: Ambulatory Visit | Attending: Family Medicine | Admitting: Family Medicine

## 2013-02-08 DIAGNOSIS — Z139 Encounter for screening, unspecified: Secondary | ICD-10-CM

## 2013-02-08 DIAGNOSIS — Z1231 Encounter for screening mammogram for malignant neoplasm of breast: Secondary | ICD-10-CM | POA: Insufficient documentation

## 2013-02-18 ENCOUNTER — Encounter (INDEPENDENT_AMBULATORY_CARE_PROVIDER_SITE_OTHER): Payer: Self-pay

## 2013-02-18 ENCOUNTER — Other Ambulatory Visit: Payer: Self-pay | Admitting: Family Medicine

## 2013-02-18 ENCOUNTER — Ambulatory Visit (INDEPENDENT_AMBULATORY_CARE_PROVIDER_SITE_OTHER): Payer: BC Managed Care – PPO

## 2013-02-18 DIAGNOSIS — Z23 Encounter for immunization: Secondary | ICD-10-CM

## 2013-03-12 ENCOUNTER — Telehealth (HOSPITAL_COMMUNITY): Payer: Self-pay | Admitting: Dietician

## 2013-03-12 NOTE — Telephone Encounter (Signed)
Returned call at [phone]. Registered to attend DM class on 05/14/13 at 1000.

## 2013-03-12 NOTE — Telephone Encounter (Signed)
Received voicemail left on 03/10/13 at 1150.

## 2013-04-02 LAB — LIPID PANEL
LDL Cholesterol: 94 mg/dL (ref 0–99)
VLDL: 14 mg/dL (ref 0–40)

## 2013-04-02 LAB — COMPLETE METABOLIC PANEL WITH GFR
ALT: 17 U/L (ref 0–35)
AST: 15 U/L (ref 0–37)
Alkaline Phosphatase: 110 U/L (ref 39–117)
GFR, Est Non African American: 73 mL/min
Sodium: 139 mEq/L (ref 135–145)
Total Bilirubin: 0.3 mg/dL (ref 0.3–1.2)
Total Protein: 6.7 g/dL (ref 6.0–8.3)

## 2013-04-02 LAB — HEMOGLOBIN A1C
Hgb A1c MFr Bld: 7.6 % — ABNORMAL HIGH (ref <5.7)
Mean Plasma Glucose: 171 mg/dL — ABNORMAL HIGH (ref <117)

## 2013-04-06 ENCOUNTER — Encounter: Payer: Self-pay | Admitting: Family Medicine

## 2013-04-06 ENCOUNTER — Encounter (INDEPENDENT_AMBULATORY_CARE_PROVIDER_SITE_OTHER): Payer: Self-pay

## 2013-04-06 ENCOUNTER — Ambulatory Visit (INDEPENDENT_AMBULATORY_CARE_PROVIDER_SITE_OTHER): Payer: BC Managed Care – PPO | Admitting: Family Medicine

## 2013-04-06 VITALS — BP 124/82 | HR 100 | Resp 18 | Ht 66.0 in | Wt 244.1 lb

## 2013-04-06 DIAGNOSIS — I10 Essential (primary) hypertension: Secondary | ICD-10-CM

## 2013-04-06 DIAGNOSIS — E669 Obesity, unspecified: Secondary | ICD-10-CM

## 2013-04-06 DIAGNOSIS — E785 Hyperlipidemia, unspecified: Secondary | ICD-10-CM

## 2013-04-06 DIAGNOSIS — E1065 Type 1 diabetes mellitus with hyperglycemia: Secondary | ICD-10-CM

## 2013-04-06 MED ORDER — PRAVASTATIN SODIUM 80 MG PO TABS: 80.0000 mg | ORAL_TABLET | Freq: Every evening | ORAL | Status: DC

## 2013-04-06 MED ORDER — CANAGLIFLOZIN 300 MG PO TABS: 1.0000 | ORAL_TABLET | Freq: Every day | ORAL | Status: DC

## 2013-04-06 MED ORDER — GLIPIZIDE 10 MG PO TABS: 10.0000 mg | ORAL_TABLET | Freq: Two times a day (BID) | ORAL | Status: DC

## 2013-04-06 MED ORDER — LISINOPRIL 20 MG PO TABS: ORAL_TABLET | ORAL | Status: DC

## 2013-04-06 NOTE — Progress Notes (Signed)
   Subjective:    Patient ID: Helen Mahoney, female    DOB: 12-Oct-1950, 62 y.o.   MRN: [id number]  HPI The PT is here for follow up and re-evaluation of chronic medical conditions, medication management and review of any available recent lab and radiology data.  Preventive health is updated, specifically  Cancer screening and Immunization.   Questions or concerns regarding consultations or procedures which the PT has had in the interim are  addressed. The PT denies any adverse reactions to current medications since the last visit.  There are no new concerns.  There are no specific complaints  Does state that she has started exercising, sits and pedals, but still notes tha t her blood sugars remain uncontrolled with fasting sugars between 140 to 160 Still reports excess intake of sweets     Review of Systems See HPI Denies recent fever or chills. Denies sinus pressure, nasal congestion, ear pain or sore throat. Denies chest congestion, productive cough or wheezing. Denies chest pains, palpitations and leg swelling Denies abdominal pain, nausea, vomiting,diarrhea or constipation.   Denies dysuria, frequency, hesitancy or incontinence. Denies joint pain, swelling and limitation in mobility. Denies headaches, seizures, numbness, or tingling. Denies depression, anxiety or insomnia. Denies skin break down or rash.        Objective:   Physical Exam  Patient alert and oriented and in no cardiopulmonary distress.  HEENT: No facial asymmetry, EOMI, no sinus tenderness,  oropharynx pink and moist.  Neck supple no adenopathy.  Chest: Clear to auscultation bilaterally.  CVS: S1, S2 no murmurs, no S3.  ABD: Soft non tender. Bowel sounds normal.  Ext: No edema  MS: Adequate ROM spine, shoulders, hips and knees.  Skin: Intact, no ulcerations or rash noted.  Psych: Good eye contact, normal affect. Memory intact not anxious or depressed appearing.  CNS: CN 2-12 intact,  power, tone and sensation normal throughout.       Assessment & Plan:

## 2013-04-06 NOTE — Patient Instructions (Signed)
F/u in 3.5 month, call if you need me before  New for your diabetes is invokana 300mg  once daily, you will get 100mg  one daily for 1 bottle, 300mg  one daily for 1 bottle a coupon and a script to fill the 300mg  dose after you have taken the samples which you do have Continue other medications as  Currently taking   hBA1C chem 7 and EGFR non fasting in 3.5 month

## 2013-04-11 NOTE — Assessment & Plan Note (Signed)
Controlled, no change in medication Hyperlipidemia:Low fat diet discussed and encouraged.  \ 

## 2013-04-11 NOTE — Assessment & Plan Note (Signed)
unchanged and uncontrolled. Patient advised to reduce carb and sweets, commit to regular physical activity, take meds as prescribed, test blood as directed, and attempt to lose weight, to improve blood sugar control. Add invokana titrate up as tolerated

## 2013-04-11 NOTE — Assessment & Plan Note (Signed)
Unchanged. Patient re-educated about  the importance of commitment to a  minimum of 150 minutes of exercise per week. The importance of healthy food choices with portion control discussed. Encouraged to start a food diary, count calories and to consider  joining a support group. Sample diet sheets offered. Goals set by the patient for the next several months.    

## 2013-04-11 NOTE — Assessment & Plan Note (Signed)
Controlled, no change in medication DASH diet and commitment to daily physical activity for a minimum of 30 minutes discussed and encouraged, as a part of hypertension management. The importance of attaining a healthy weight is also discussed.  

## 2013-05-04 ENCOUNTER — Encounter (HOSPITAL_COMMUNITY): Payer: Self-pay | Admitting: Dietician

## 2013-05-04 NOTE — Progress Notes (Signed)
[facility] Diabetes Class Completion  Date:May 04, 2013  Time: 1000  Pt attended [facility] Education Class on May 04, 2013.   Patient was educated on the following topics:   -Survival skills (signs and symptoms of hyperglycemia and hypoglycemia, treatment for hypoglycemia, ideal levels for fasting and postprandial blood sugars, goal Hgb A1c level, foot care basics)  -Recommendations for physical activity   -Carbohydrate metabolism in relation to diabetes   -Meal planning (sources of carbohydrate, carbohydrate counting, meal planning strategies, food label reading, and portion control).  Handouts provided:  -"Diabetes and You: Taking Charge of Your Health"  -"Carbohydrate Counting and Meal Planning"  -"Your Guide to Better Office Visits"   Deny Chevez A. Hedaya Latendresse, RD, LDN  

## 2013-07-16 LAB — COMPLETE METABOLIC PANEL WITH GFR
ALT: 17 U/L (ref 0–35)
AST: 15 U/L (ref 0–37)
Albumin: 3.8 g/dL (ref 3.5–5.2)
Alkaline Phosphatase: 118 U/L — ABNORMAL HIGH (ref 39–117)
BUN: 15 mg/dL (ref 6–23)
CO2: 28 mEq/L (ref 19–32)
Calcium: 9.3 mg/dL (ref 8.4–10.5)
Chloride: 104 mEq/L (ref 96–112)
Creat: 0.81 mg/dL (ref 0.50–1.10)
GFR, Est African American: 89 mL/min
GFR, Est Non African American: 78 mL/min
Glucose, Bld: 77 mg/dL (ref 70–99)
Potassium: 4.7 mEq/L (ref 3.5–5.3)
Sodium: 141 mEq/L (ref 135–145)
Total Bilirubin: 0.4 mg/dL (ref 0.2–1.2)
Total Protein: 6.8 g/dL (ref 6.0–8.3)

## 2013-07-16 LAB — HEMOGLOBIN A1C
Hgb A1c MFr Bld: 7.9 % — ABNORMAL HIGH (ref <5.7)
Mean Plasma Glucose: 180 mg/dL — ABNORMAL HIGH (ref <117)

## 2013-07-21 ENCOUNTER — Ambulatory Visit (INDEPENDENT_AMBULATORY_CARE_PROVIDER_SITE_OTHER): Payer: BC Managed Care – PPO | Admitting: Family Medicine

## 2013-07-21 ENCOUNTER — Encounter (INDEPENDENT_AMBULATORY_CARE_PROVIDER_SITE_OTHER): Payer: Self-pay

## 2013-07-21 ENCOUNTER — Encounter: Payer: Self-pay | Admitting: Family Medicine

## 2013-07-21 VITALS — BP 130/78 | HR 98 | Resp 16 | Wt 242.4 lb

## 2013-07-21 DIAGNOSIS — E1065 Type 1 diabetes mellitus with hyperglycemia: Secondary | ICD-10-CM

## 2013-07-21 DIAGNOSIS — E785 Hyperlipidemia, unspecified: Secondary | ICD-10-CM

## 2013-07-21 DIAGNOSIS — E1165 Type 2 diabetes mellitus with hyperglycemia: Principal | ICD-10-CM

## 2013-07-21 DIAGNOSIS — M899 Disorder of bone, unspecified: Secondary | ICD-10-CM

## 2013-07-21 DIAGNOSIS — E119 Type 2 diabetes mellitus without complications: Secondary | ICD-10-CM

## 2013-07-21 DIAGNOSIS — IMO0001 Reserved for inherently not codable concepts without codable children: Secondary | ICD-10-CM

## 2013-07-21 DIAGNOSIS — E669 Obesity, unspecified: Secondary | ICD-10-CM

## 2013-07-21 DIAGNOSIS — Z794 Long term (current) use of insulin: Principal | ICD-10-CM

## 2013-07-21 DIAGNOSIS — IMO0002 Reserved for concepts with insufficient information to code with codable children: Secondary | ICD-10-CM

## 2013-07-21 DIAGNOSIS — I10 Essential (primary) hypertension: Secondary | ICD-10-CM

## 2013-07-21 DIAGNOSIS — M949 Disorder of cartilage, unspecified: Secondary | ICD-10-CM

## 2013-07-21 MED ORDER — METFORMIN HCL 1000 MG PO TABS: 1000.0000 mg | ORAL_TABLET | Freq: Two times a day (BID) | ORAL | Status: DC

## 2013-07-21 MED ORDER — LISINOPRIL 20 MG PO TABS: ORAL_TABLET | ORAL | Status: DC

## 2013-07-21 MED ORDER — FLUCONAZOLE 150 MG PO TABS: ORAL_TABLET | ORAL | Status: DC

## 2013-07-21 MED ORDER — PRAVASTATIN SODIUM 80 MG PO TABS: 80.0000 mg | ORAL_TABLET | Freq: Every evening | ORAL | Status: DC

## 2013-07-21 NOTE — Patient Instructions (Signed)
F/U in 3. months, call if you need me before You need to be on aspirin 81 mg once daily for stroke risk reduction also heart protection, also calcium with D daily for bones  Please start the invokana, as we discussed and increase to twice daily blood sugar testing.Call with problems or questions please. We will see if your meter is accurate since your numbers that you bring in do not reflect your lab result Your blood sugar has gotten worse.If you cannot tolerate the invokana, your levimir dose can be adjusted   You will get a prescription to  Hold for fluconazole tablets, fill and use only if you get yeast infection causing vaginal itching. Goal for fasting blood sugar ranges from 80 to 120 and 2 hours after any meal or at bedtime should be between 130 to 170.PLEASE check TWICE DAILY   It is important that you exercise regularly at least 30 minutes 5 times a week. If you develop chest pain, have severe difficulty breathing, or feel very tired, stop exercising immediately and seek medical attention    A healthy diet is rich in fruit, vegetables and whole grains. Poultry fish, nuts and beans are a healthy choice for protein rather then red meat. A low sodium diet and drinking 64 ounces of water daily is generally recommended. Oils and sweet should be limited. Carbohydrates especially for those who are diabetic or overweight, should be limited to 45 to 60 gram per meal. It is important to eat on a regular schedule, at least 3 times daily. Snacks should be primarily fruits, vegetables or nuts.  Adequate rest, generally 6 to 8 hours per night is important for good health.Good sleep hygiene involves setting a regular bedtime, and turning off all sound and light in your sleep environment.Limiting caffeine intake will also help with the ability to rest well  Lab work needs to be done 3 to 5 days before your follow up visit please.Fasting lipid, cmp and eGFR, hBA1C , TSH, microalb, CBC, Vit D  All  medications need to be brought to every visit

## 2013-07-21 NOTE — Progress Notes (Signed)
   Subjective:    Patient ID: Helen Mahoney, female    DOB: 01/21/1951, 63 y.o.   MRN: [id number]  HPI The PT is here for follow up and re-evaluation of chronic medical conditions, medication management and review of any available recent lab and radiology data.  Preventive health is updated, specifically  Cancer screening and Immunization.   Questions or concerns regarding consultations or procedures which the PT has had in the interim are  addressed. The PT denies any adverse reactions to current medications since the last visit.  There are no new concerns.  There are no specific complaints       Review of Systems See HPI Denies recent fever or chills. Denies sinus pressure, nasal congestion, ear pain or sore throat. Denies chest congestion, productive cough or wheezing. Denies chest pains, palpitations and leg swelling Denies abdominal pain, nausea, vomiting,diarrhea or constipation.   Denies dysuria, frequency, hesitancy or incontinence. Denies joint pain, swelling and limitation in mobility. Denies headaches, seizures, numbness, or tingling. Denies depression, anxiety or insomnia. Denies skin break down or rash.        Objective:   Physical Exam  BP 130/78  Pulse 98  Resp 16  Wt 242 lb 6.4 oz (109.952 kg)  SpO2 99% Patient alert and oriented and in no cardiopulmonary distress.  HEENT: No facial asymmetry, EOMI, no sinus tenderness,  oropharynx pink and moist.  Neck supple no adenopathy.  Chest: Clear to auscultation bilaterally.  CVS: S1, S2 no murmurs, no S3.  ABD: Soft non tender. Bowel sounds normal.  Ext: No edema  MS: Adequate ROM spine, shoulders, hips and knees.  Skin: Intact, no ulcerations or rash noted.  Psych: Good eye contact, normal affect. Memory intact not anxious or depressed appearing.  CNS: CN 2-12 intact, power, tone and sensation normal throughout.       Assessment & Plan:  Diabetes mellitus, insulin dependent (IDDM),  uncontrolled Deteriorated. Patient advised to reduce carb and sweets, commit to regular physical activity, take meds as prescribed, test blood as directed, and attempt to lose weight, to improve blood sugar control.  HYPERLIPIDEMIA Controlled, no change in medication Hyperlipidemia:Low fat diet discussed and encouraged.    OBESITY Unchanged Patient re-educated about  the importance of commitment to a  minimum of 150 minutes of exercise per week. The importance of healthy food choices with portion control discussed. Encouraged to start a food diary, count calories and to consider  joining a support group. Sample diet sheets offered. Goals set by the patient for the next several months.     HYPERTENSION Controlled, no change in medication DASH diet and commitment to daily physical activity for a minimum of 30 minutes discussed and encouraged, as a part of hypertension management. The importance of attaining a healthy weight is also discussed.

## 2013-07-25 NOTE — Assessment & Plan Note (Signed)
Controlled, no change in medication Hyperlipidemia:Low fat diet discussed and encouraged.  \ 

## 2013-07-25 NOTE — Assessment & Plan Note (Signed)
Deteriorated Patient advised to reduce carb and sweets, commit to regular physical activity, take meds as prescribed, test blood as directed, and attempt to lose weight, to improve blood sugar control.  

## 2013-07-25 NOTE — Assessment & Plan Note (Signed)
Unchanged. Patient re-educated about  the importance of commitment to a  minimum of 150 minutes of exercise per week. The importance of healthy food choices with portion control discussed. Encouraged to start a food diary, count calories and to consider  joining a support group. Sample diet sheets offered. Goals set by the patient for the next several months.    

## 2013-07-25 NOTE — Assessment & Plan Note (Signed)
Controlled, no change in medication DASH diet and commitment to daily physical activity for a minimum of 30 minutes discussed and encouraged, as a part of hypertension management. The importance of attaining a healthy weight is also discussed.  

## 2013-08-09 ENCOUNTER — Other Ambulatory Visit: Payer: Self-pay | Admitting: Family Medicine

## 2013-09-22 LAB — HM DIABETES EYE EXAM

## 2013-10-02 LAB — COMPLETE METABOLIC PANEL WITH GFR
ALBUMIN: 3.5 g/dL (ref 3.5–5.2)
ALK PHOS: 98 U/L (ref 39–117)
ALT: 15 U/L (ref 0–35)
AST: 15 U/L (ref 0–37)
BILIRUBIN TOTAL: 0.3 mg/dL (ref 0.2–1.2)
BUN: 15 mg/dL (ref 6–23)
CO2: 24 mEq/L (ref 19–32)
Calcium: 9 mg/dL (ref 8.4–10.5)
Chloride: 105 mEq/L (ref 96–112)
Creat: 0.97 mg/dL (ref 0.50–1.10)
GFR, Est African American: 72 mL/min
GFR, Est Non African American: 62 mL/min
Glucose, Bld: 87 mg/dL (ref 70–99)
Potassium: 4.6 mEq/L (ref 3.5–5.3)
SODIUM: 136 meq/L (ref 135–145)
Total Protein: 6.4 g/dL (ref 6.0–8.3)

## 2013-10-02 LAB — CBC WITH DIFFERENTIAL/PLATELET
Basophils Absolute: 0.1 10*3/uL (ref 0.0–0.1)
Basophils Relative: 1 % (ref 0–1)
Eosinophils Absolute: 0.4 10*3/uL (ref 0.0–0.7)
Eosinophils Relative: 5 % (ref 0–5)
HEMATOCRIT: 32.9 % — AB (ref 36.0–46.0)
HEMOGLOBIN: 10.1 g/dL — AB (ref 12.0–15.0)
LYMPHS ABS: 2 10*3/uL (ref 0.7–4.0)
LYMPHS PCT: 26 % (ref 12–46)
MCH: 20 pg — ABNORMAL LOW (ref 26.0–34.0)
MCHC: 30.7 g/dL (ref 30.0–36.0)
MCV: 65.3 fL — ABNORMAL LOW (ref 78.0–100.0)
MONO ABS: 0.5 10*3/uL (ref 0.1–1.0)
MONOS PCT: 7 % (ref 3–12)
NEUTROS ABS: 4.6 10*3/uL (ref 1.7–7.7)
NEUTROS PCT: 61 % (ref 43–77)
Platelets: 391 10*3/uL (ref 150–400)
RBC: 5.04 MIL/uL (ref 3.87–5.11)
RDW: 17.8 % — ABNORMAL HIGH (ref 11.5–15.5)
WBC: 7.5 10*3/uL (ref 4.0–10.5)

## 2013-10-02 LAB — LIPID PANEL
CHOL/HDL RATIO: 4 ratio
Cholesterol: 174 mg/dL (ref 0–200)
HDL: 44 mg/dL (ref >39)
LDL Cholesterol: 117 mg/dL — ABNORMAL HIGH (ref 0–99)
Triglycerides: 65 mg/dL (ref <150)
VLDL: 13 mg/dL (ref 0–40)

## 2013-10-02 LAB — MICROALBUMIN / CREATININE URINE RATIO
CREATININE, URINE: 113.8 mg/dL
MICROALB UR: 1.25 mg/dL (ref 0.00–1.89)
Microalb Creat Ratio: 11 mg/g (ref 0.0–30.0)

## 2013-10-02 LAB — TSH: TSH: 2.54 u[IU]/mL (ref 0.350–4.500)

## 2013-10-03 LAB — HEMOGLOBIN A1C
HEMOGLOBIN A1C: 7.1 % — AB (ref <5.7)
Mean Plasma Glucose: 157 mg/dL — ABNORMAL HIGH (ref <117)

## 2013-10-03 NOTE — Progress Notes (Signed)
   Subjective:    Patient ID: Helen Mahoney, female    DOB: [birth year], 63 y.o.   MRN: [id number]  HPI Pt was not seen in the office on this day appt cancelled   Review of Systems     Objective:   Physical Exam        Assessment & Plan:

## 2013-10-04 LAB — VITAMIN D 25 HYDROXY (VIT D DEFICIENCY, FRACTURES): VIT D 25 HYDROXY: 23 ng/mL — AB (ref 30–89)

## 2013-10-06 ENCOUNTER — Encounter: Payer: Self-pay | Admitting: Family Medicine

## 2013-10-06 ENCOUNTER — Ambulatory Visit (INDEPENDENT_AMBULATORY_CARE_PROVIDER_SITE_OTHER): Payer: BC Managed Care – PPO | Admitting: Family Medicine

## 2013-10-06 ENCOUNTER — Encounter (INDEPENDENT_AMBULATORY_CARE_PROVIDER_SITE_OTHER): Payer: Self-pay

## 2013-10-06 VITALS — BP 120/82 | HR 99 | Resp 16 | Wt 243.0 lb

## 2013-10-06 DIAGNOSIS — E1065 Type 1 diabetes mellitus with hyperglycemia: Secondary | ICD-10-CM

## 2013-10-06 DIAGNOSIS — E1165 Type 2 diabetes mellitus with hyperglycemia: Principal | ICD-10-CM

## 2013-10-06 DIAGNOSIS — Z794 Long term (current) use of insulin: Principal | ICD-10-CM

## 2013-10-06 DIAGNOSIS — IMO0001 Reserved for inherently not codable concepts without codable children: Secondary | ICD-10-CM

## 2013-10-06 DIAGNOSIS — E119 Type 2 diabetes mellitus without complications: Secondary | ICD-10-CM

## 2013-10-06 DIAGNOSIS — I10 Essential (primary) hypertension: Secondary | ICD-10-CM

## 2013-10-06 DIAGNOSIS — E785 Hyperlipidemia, unspecified: Secondary | ICD-10-CM

## 2013-10-06 DIAGNOSIS — IMO0002 Reserved for concepts with insufficient information to code with codable children: Secondary | ICD-10-CM

## 2013-10-06 DIAGNOSIS — E669 Obesity, unspecified: Secondary | ICD-10-CM

## 2013-10-06 MED ORDER — LISINOPRIL 20 MG PO TABS: ORAL_TABLET | ORAL | Status: DC

## 2013-10-06 MED ORDER — GLIPIZIDE 10 MG PO TABS
10.0000 mg | ORAL_TABLET | Freq: Two times a day (BID) | ORAL | Status: DC
Start: 2013-10-06 — End: 2014-03-14

## 2013-10-06 MED ORDER — PRAVASTATIN SODIUM 80 MG PO TABS: 80.0000 mg | ORAL_TABLET | Freq: Every evening | ORAL | Status: DC

## 2013-10-06 MED ORDER — METFORMIN HCL 1000 MG PO TABS: 1000.0000 mg | ORAL_TABLET | Freq: Two times a day (BID) | ORAL | Status: DC

## 2013-10-06 MED ORDER — EZETIMIBE 10 MG PO TABS: 10.0000 mg | ORAL_TABLET | Freq: Every day | ORAL | Status: DC

## 2013-10-06 NOTE — Patient Instructions (Signed)
Annual physical exam 11/14 or after, call if you need me before  Fasting lipid, cmp and EGFR, hBa1C  On October 6 or after  Take vit D3 800 IU or 1000IU every day , vit D is low  It is important that you exercise regularly at least 30 minutes 5 times a week. If you develop chest pain, have severe difficulty breathing, or feel very tired, stop exercising immediately and seek medical attention    Foot exam is good , except for bunnions

## 2013-10-08 NOTE — Assessment & Plan Note (Signed)
Deteriorated and uncontrolled Hyperlipidemia:Low fat diet discussed and encouraged.  Updated lab needed at/ before next visit.

## 2013-10-08 NOTE — Assessment & Plan Note (Signed)
Controlled, no change in medication DASH diet and commitment to daily physical activity for a minimum of 30 minutes discussed and encouraged, as a part of hypertension management. The importance of attaining a healthy weight is also discussed.  

## 2013-10-08 NOTE — Progress Notes (Signed)
   Subjective:    Patient ID: Helen Mahoney, female    DOB: 1950-12-29, 63 y.o.   MRN: [id number]  HPI The PT is here for follow up and re-evaluation of chronic medical conditions, medication management and review of any available recent lab and radiology data.  Preventive health is updated, specifically  Cancer screening and Immunization.   Questions or concerns regarding consultations or procedures which the PT has had in the interim are  addressed. The PT denies any adverse reactions to current medications since the last visit.  There are no new concerns.  There are no specific complaints    Denies hypoglycemic episodes, denies polyuria, polydipsia or blurred vision, still no regular exercise   Review of Systems See HPI Denies recent fever or chills. Denies sinus pressure, nasal congestion, ear pain or sore throat. Denies chest congestion, productive cough or wheezing. Denies chest pains, palpitations and leg swelling Denies abdominal pain, nausea, vomiting,diarrhea or constipation.   Denies dysuria, frequency, hesitancy or incontinence. Denies joint pain, swelling and limitation in mobility. Denies headaches, seizures, numbness, or tingling. Denies depression, anxiety or insomnia. Denies skin break down or rash.        Objective:   Physical Exam BP 120/82  Pulse 99  Resp 16  Wt 243 lb (110.224 kg)  SpO2 97% Patient alert and oriented and in no cardiopulmonary distress.  HEENT: No facial asymmetry, EOMI,   oropharynx pink and moist.  Neck supple no JVD, no mass.  Chest: Clear to auscultation bilaterally.  CVS: S1, S2 no murmurs, no S3.  ABD: Soft non tender.   Ext: No edema  MS: Adequate ROM spine, shoulders, hips and knees.  Skin: Intact, no ulcerations or rash noted.  Psych: Good eye contact, normal affect. Memory intact not anxious or depressed appearing.  CNS: CN 2-12 intact, power,  normal throughout.no focal deficits noted.          Assessment & Plan:  Diabetes mellitus, insulin dependent (IDDM), uncontrolled Marked improvement, pt encouraged to continue same Patient advised to reduce carb and sweets, commit to regular physical activity, take meds as prescribed, test blood as directed, and attempt to lose weight, to improve blood sugar control.   HYPERLIPIDEMIA Deteriorated and uncontrolled Hyperlipidemia:Low fat diet discussed and encouraged.  Updated lab needed at/ before next visit.   OBESITY Unchanged Patient re-educated about  the importance of commitment to a  minimum of 150 minutes of exercise per week. The importance of healthy food choices with portion control discussed. Encouraged to start a food diary, count calories and to consider  joining a support group. Sample diet sheets offered. Goals set by the patient for the next several months.     HYPERTENSION Controlled, no change in medication DASH diet and commitment to daily physical activity for a minimum of 30 minutes discussed and encouraged, as a part of hypertension management. The importance of attaining a healthy weight is also discussed.

## 2013-10-08 NOTE — Assessment & Plan Note (Signed)
Unchanged. Patient re-educated about  the importance of commitment to a  minimum of 150 minutes of exercise per week. The importance of healthy food choices with portion control discussed. Encouraged to start a food diary, count calories and to consider  joining a support group. Sample diet sheets offered. Goals set by the patient for the next several months.    

## 2013-10-08 NOTE — Assessment & Plan Note (Signed)
Marked improvement, pt encouraged to continue same Patient advised to reduce carb and sweets, commit to regular physical activity, take meds as prescribed, test blood as directed, and attempt to lose weight, to improve blood sugar control.

## 2013-11-09 ENCOUNTER — Other Ambulatory Visit: Payer: Self-pay | Admitting: Family Medicine

## 2013-11-09 ENCOUNTER — Telehealth: Payer: Self-pay | Admitting: Family Medicine

## 2013-11-09 NOTE — Telephone Encounter (Signed)
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

## 2013-12-20 ENCOUNTER — Telehealth: Payer: Self-pay | Admitting: Family Medicine

## 2013-12-20 DIAGNOSIS — IMO0001 Reserved for inherently not codable concepts without codable children: Secondary | ICD-10-CM

## 2013-12-20 DIAGNOSIS — E1165 Type 2 diabetes mellitus with hyperglycemia: Principal | ICD-10-CM

## 2013-12-20 DIAGNOSIS — Z794 Long term (current) use of insulin: Principal | ICD-10-CM

## 2013-12-20 MED ORDER — INSULIN DETEMIR 100 UNIT/ML FLEXPEN: 25.0000 [IU] | PEN_INJECTOR | Freq: Every day | SUBCUTANEOUS | Status: DC

## 2013-12-20 MED ORDER — GLUCOSE BLOOD VI STRP: ORAL_STRIP | Status: DC

## 2013-12-20 NOTE — Telephone Encounter (Signed)
MEDS REFILLED

## 2013-12-22 ENCOUNTER — Telehealth: Payer: Self-pay

## 2013-12-22 NOTE — Telephone Encounter (Signed)
States she went to pick up her levemir pen and it was $200. Has been able to afford up until this month when the price went up. Is completely out now. Please advise

## 2013-12-22 NOTE — Telephone Encounter (Signed)
See if lantuis is preferred at lower copay with coupon pls

## 2013-12-23 ENCOUNTER — Other Ambulatory Visit: Payer: Self-pay

## 2013-12-23 MED ORDER — INSULIN GLARGINE 100 UNIT/ML SOLOSTAR PEN: 25.0000 [IU] | PEN_INJECTOR | Freq: Every day | SUBCUTANEOUS | Status: DC

## 2013-12-23 NOTE — Telephone Encounter (Signed)
Called pt and left message to call back.

## 2013-12-23 NOTE — Telephone Encounter (Signed)
lantus faxed  In to check the price

## 2013-12-24 NOTE — Telephone Encounter (Signed)
See next telephone message. 

## 2013-12-28 NOTE — Telephone Encounter (Signed)
Reduce  dose lantus to 20 uniits, if blood sugar fasting is less than 70 this is too low.PLEASE let her hear and understand this clearly  Pls advise her to call back next Tuesday if range is below 80 or above 130 fasting for further   Advise she may contact by pager outside office hrs if questions

## 2013-12-28 NOTE — Telephone Encounter (Signed)
Pt called stating she is taking a new insulin and her blood sugar has been low it has been running [phone] for the last 3 days. Please advise [phone]

## 2013-12-28 NOTE — Telephone Encounter (Signed)
Those numbers ARE TOO LOW. What insulin is she using ???

## 2013-12-28 NOTE — Telephone Encounter (Signed)
The levemir was too expensive so you told me to send in the Lantus. She is doing lantus 25 units

## 2013-12-28 NOTE — Telephone Encounter (Signed)
Has been using 25 units at night and her sugars have been running 56, 63 and 61. Does she need to reduce? Please advise

## 2013-12-28 NOTE — Telephone Encounter (Signed)
Patient aware.  Will make a note to followup with patient is she is not heard from by 9/8

## 2014-01-12 ENCOUNTER — Telehealth: Payer: Self-pay

## 2014-01-12 NOTE — Telephone Encounter (Signed)
Needs to continue same medications, but on the days when her fasting sugar (I am aassuming ) is less than 80, she needs to figure out what she did differently that it fell so low, which is dangerous, and she needs to ensure she eats sufficient in the evening not to have this happen, needs to try to be CONSISTENT in food/carb  intake every day to prevent the dips and highs. Overall I am very pleased with the numbers, no change in med, but consistency in food intake, every day same amt (carbs) same time, and I hope she uiis doing some exercise too

## 2014-01-13 NOTE — Telephone Encounter (Signed)
Called and left voicemail for patient notifying of advice.

## 2014-01-27 ENCOUNTER — Other Ambulatory Visit: Payer: Self-pay | Admitting: Family Medicine

## 2014-01-27 DIAGNOSIS — Z1231 Encounter for screening mammogram for malignant neoplasm of breast: Secondary | ICD-10-CM

## 2014-02-02 LAB — COMPLETE METABOLIC PANEL WITH GFR
ALT: 12 U/L (ref 0–35)
AST: 15 U/L (ref 0–37)
Albumin: 3.3 g/dL — ABNORMAL LOW (ref 3.5–5.2)
Alkaline Phosphatase: 116 U/L (ref 39–117)
BUN: 14 mg/dL (ref 6–23)
CO2: 25 meq/L (ref 19–32)
Calcium: 8.8 mg/dL (ref 8.4–10.5)
Chloride: 105 mEq/L (ref 96–112)
Creat: 0.86 mg/dL (ref 0.50–1.10)
GFR, EST AFRICAN AMERICAN: 83 mL/min
GFR, Est Non African American: 72 mL/min
GLUCOSE: 110 mg/dL — AB (ref 70–99)
Potassium: 4.5 mEq/L (ref 3.5–5.3)
SODIUM: 140 meq/L (ref 135–145)
TOTAL PROTEIN: 6.6 g/dL (ref 6.0–8.3)
Total Bilirubin: 0.3 mg/dL (ref 0.2–1.2)

## 2014-02-02 LAB — LIPID PANEL
Cholesterol: 162 mg/dL (ref 0–200)
HDL: 56 mg/dL (ref >39)
LDL Cholesterol: 93 mg/dL (ref 0–99)
TRIGLYCERIDES: 67 mg/dL (ref <150)
Total CHOL/HDL Ratio: 2.9 Ratio
VLDL: 13 mg/dL (ref 0–40)

## 2014-02-02 LAB — HEMOGLOBIN A1C
HEMOGLOBIN A1C: 7.1 % — AB (ref <5.7)
Mean Plasma Glucose: 157 mg/dL — ABNORMAL HIGH (ref <117)

## 2014-02-10 ENCOUNTER — Ambulatory Visit (HOSPITAL_COMMUNITY)
Admission: RE | Admit: 2014-02-10 | Discharge: 2014-02-10 | Disposition: A | Discharge status: Home / Self Care | Payer: BC Managed Care – PPO | Source: Ambulatory Visit | Attending: Family Medicine | Admitting: Family Medicine

## 2014-02-10 DIAGNOSIS — Z1231 Encounter for screening mammogram for malignant neoplasm of breast: Secondary | ICD-10-CM | POA: Insufficient documentation

## 2014-02-22 IMAGING — MG MM DIGITAL SCREENING BILAT
4 series · 4 of 4 positions shown · non-contrast
Comparison: Previous exams.

CLINICAL DATA: Screening.

DIGITAL BILATERAL SCREENING MAMMOGRAM WITH CAD

[L CC]
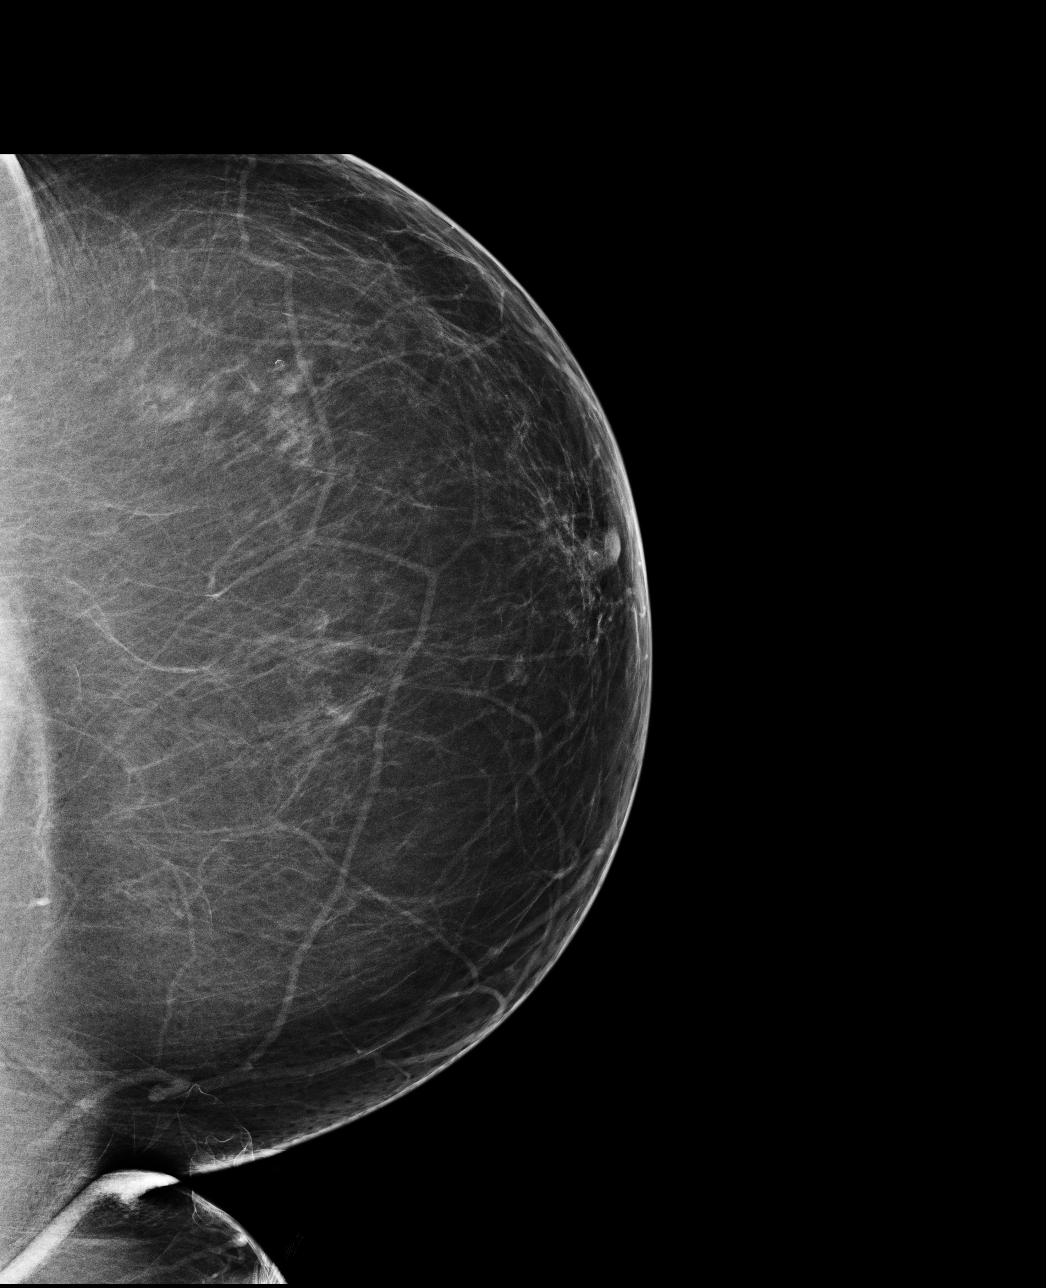

[L MLO]
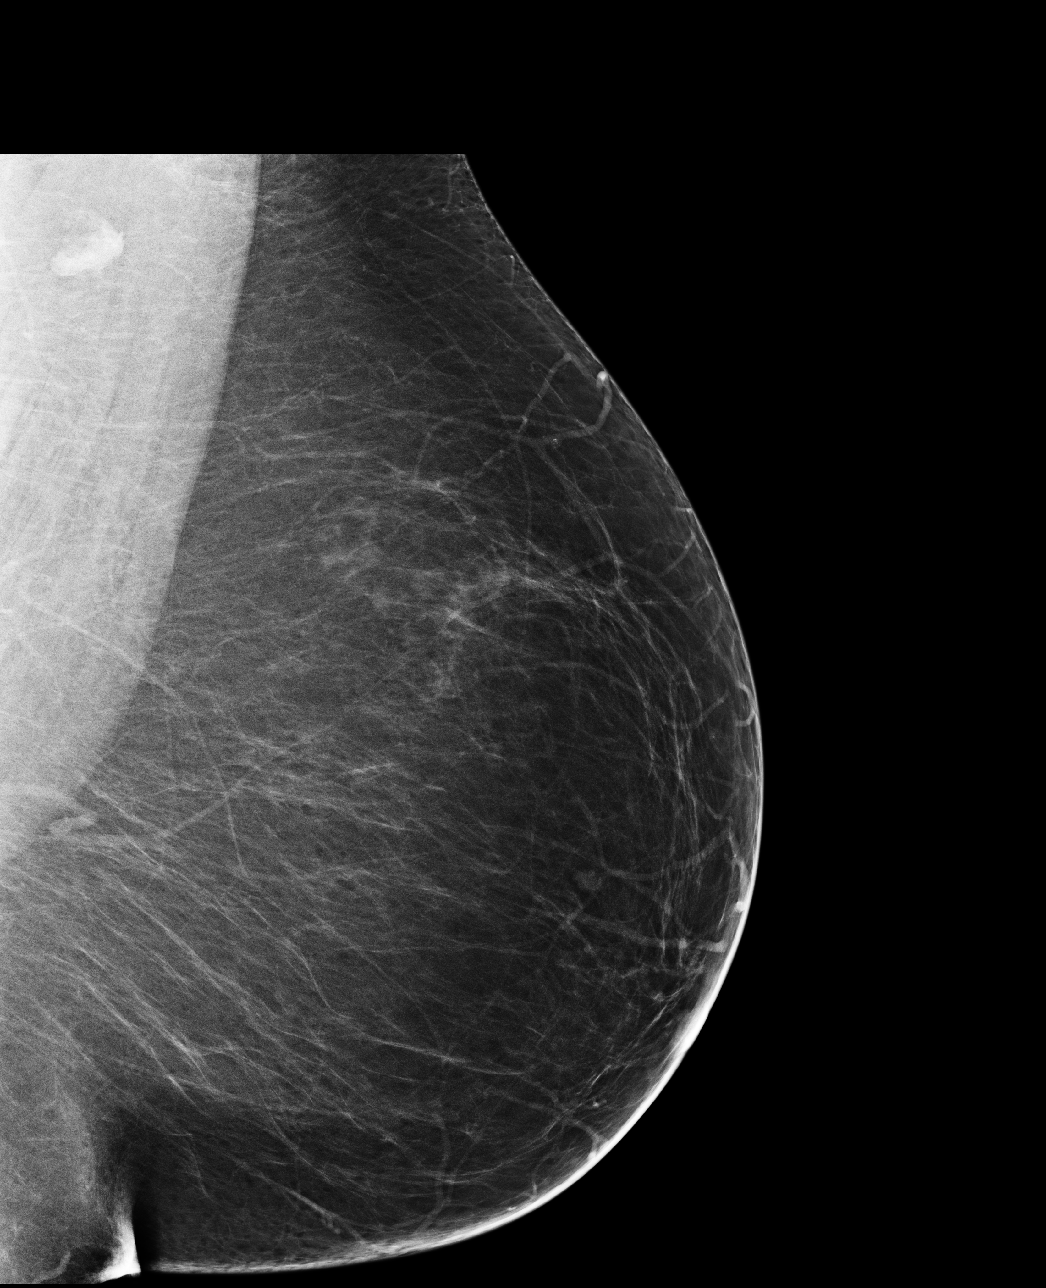

[R CC]
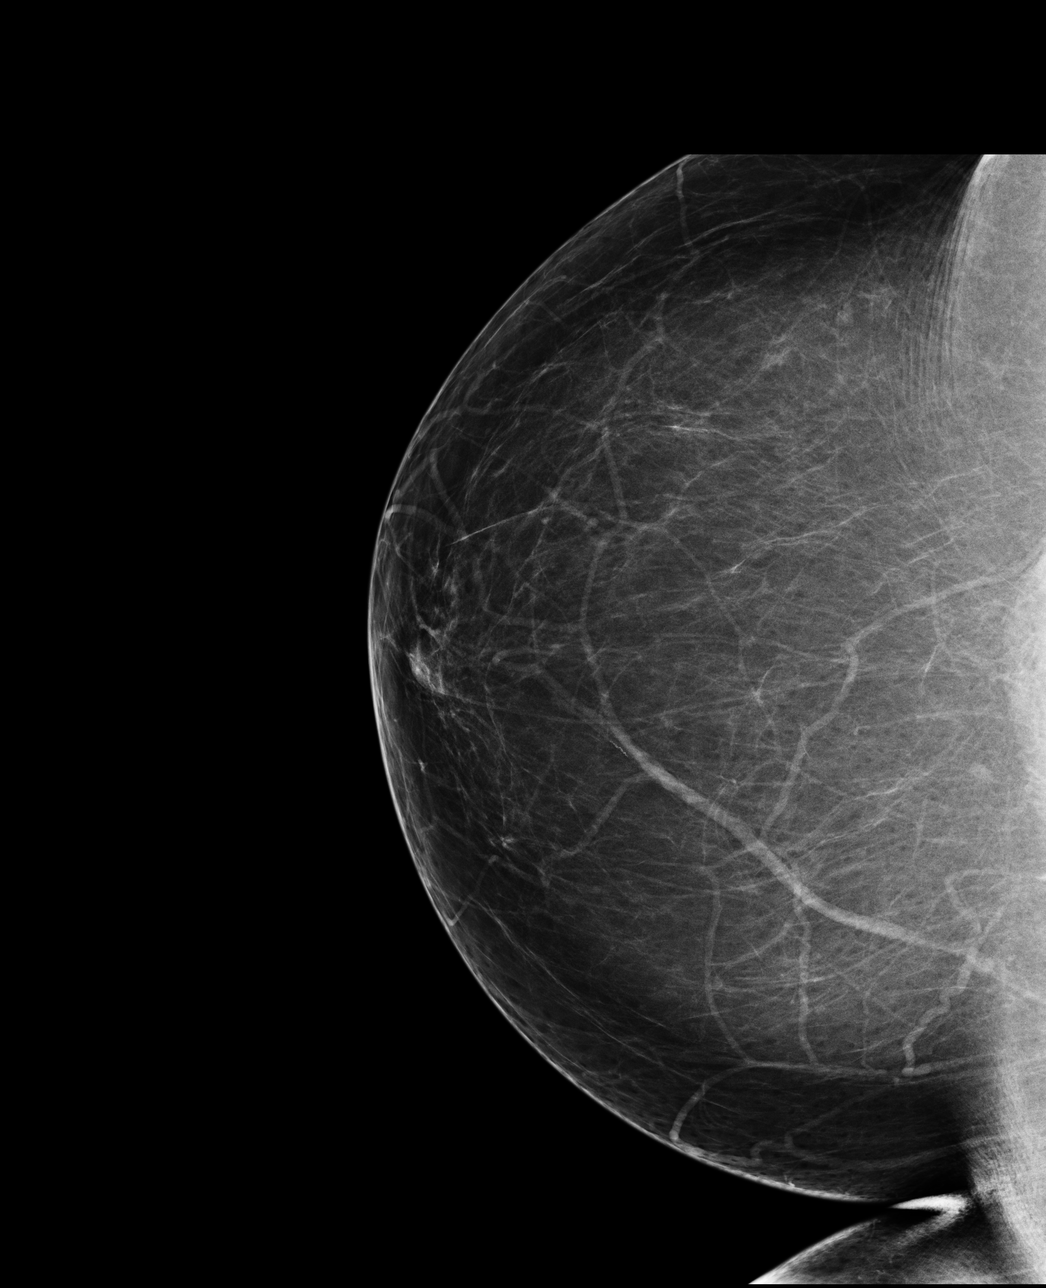

[R MLO]
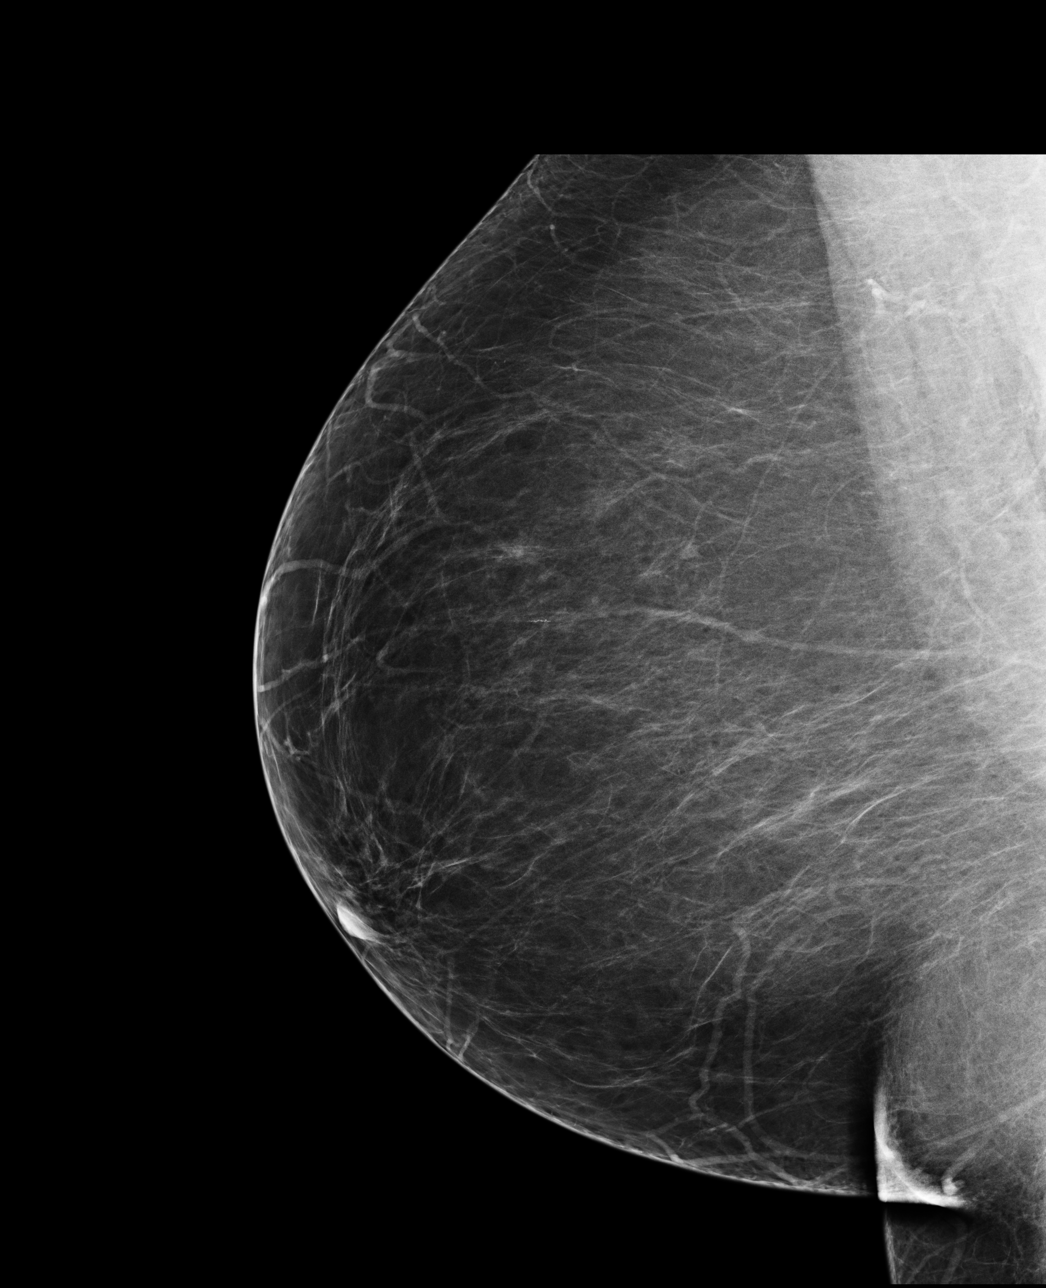

[4 of 4 positions shown; findings below may reference images not displayed]

FINDINGS: The breast tissue is almost entirely fatty. No suspicious
masses, architectural distortion, or calcifications are present.

Images were processed with CAD.
IMPRESSION: No mammographic evidence of malignancy.

A result letter of this screening mammogram will be mailed directly
to the patient.

RECOMMENDATION:
Screening mammogram in one year. (Code:5Y-M-L37)

BI-RADS CATEGORY 1:  Negative.

## 2014-02-24 ENCOUNTER — Other Ambulatory Visit: Payer: Self-pay

## 2014-02-28 ENCOUNTER — Telehealth: Payer: Self-pay | Admitting: Family Medicine

## 2014-02-28 NOTE — Telephone Encounter (Signed)
Blood sugars reading are 10.25.15  58                                            10.26.15 75                                           10.27.15 108                                             10.28.15 62                                            10.29.15 112                                             10.30.15  102                                             10.31.15  81                                              11.1.15  82                                               11.2.2015  90

## 2014-03-14 ENCOUNTER — Ambulatory Visit (INDEPENDENT_AMBULATORY_CARE_PROVIDER_SITE_OTHER): Payer: BC Managed Care – PPO

## 2014-03-14 ENCOUNTER — Other Ambulatory Visit (HOSPITAL_COMMUNITY)
Admission: RE | Admit: 2014-03-14 | Discharge: 2014-03-14 | Disposition: A | Discharge status: Home / Self Care | Payer: BC Managed Care – PPO | Source: Ambulatory Visit | Attending: Family Medicine | Admitting: Family Medicine

## 2014-03-14 ENCOUNTER — Ambulatory Visit (INDEPENDENT_AMBULATORY_CARE_PROVIDER_SITE_OTHER): Payer: BC Managed Care – PPO | Admitting: Family Medicine

## 2014-03-14 ENCOUNTER — Encounter: Payer: Self-pay | Admitting: Family Medicine

## 2014-03-14 VITALS — BP 122/74 | HR 97 | Resp 16 | Ht 66.0 in | Wt 244.4 lb

## 2014-03-14 DIAGNOSIS — Z01419 Encounter for gynecological examination (general) (routine) without abnormal findings: Secondary | ICD-10-CM | POA: Insufficient documentation

## 2014-03-14 DIAGNOSIS — IMO0001 Reserved for inherently not codable concepts without codable children: Secondary | ICD-10-CM

## 2014-03-14 DIAGNOSIS — Z0000 Encounter for general adult medical examination without abnormal findings: Secondary | ICD-10-CM

## 2014-03-14 DIAGNOSIS — Z794 Long term (current) use of insulin: Secondary | ICD-10-CM

## 2014-03-14 DIAGNOSIS — Z1211 Encounter for screening for malignant neoplasm of colon: Secondary | ICD-10-CM

## 2014-03-14 DIAGNOSIS — E785 Hyperlipidemia, unspecified: Secondary | ICD-10-CM

## 2014-03-14 DIAGNOSIS — Z23 Encounter for immunization: Secondary | ICD-10-CM | POA: Insufficient documentation

## 2014-03-14 DIAGNOSIS — E119 Type 2 diabetes mellitus without complications: Secondary | ICD-10-CM

## 2014-03-14 DIAGNOSIS — E1165 Type 2 diabetes mellitus with hyperglycemia: Secondary | ICD-10-CM

## 2014-03-14 DIAGNOSIS — Z124 Encounter for screening for malignant neoplasm of cervix: Secondary | ICD-10-CM

## 2014-03-14 LAB — POC HEMOCCULT BLD/STL (OFFICE/1-CARD/DIAGNOSTIC): Fecal Occult Blood, POC: NEGATIVE

## 2014-03-14 MED ORDER — PRAVASTATIN SODIUM 80 MG PO TABS: 80.0000 mg | ORAL_TABLET | Freq: Every evening | ORAL | Status: DC

## 2014-03-14 MED ORDER — METFORMIN HCL 1000 MG PO TABS: 1000.0000 mg | ORAL_TABLET | Freq: Two times a day (BID) | ORAL | Status: DC

## 2014-03-14 MED ORDER — GLIPIZIDE 10 MG PO TABS: 10.0000 mg | ORAL_TABLET | Freq: Two times a day (BID) | ORAL | Status: DC

## 2014-03-14 MED ORDER — LISINOPRIL 20 MG PO TABS: ORAL_TABLET | ORAL | Status: DC

## 2014-03-14 NOTE — Patient Instructions (Signed)
F/u in 4 month, pls call if you need me before  Flu vaccine today  No changes in medication  Pls commit to daily exercise this will improve your health  HBA1C, chem 7 and EGFr in 4 month, non fasting

## 2014-03-14 NOTE — Assessment & Plan Note (Signed)

## 2014-03-14 NOTE — Progress Notes (Signed)
   Subjective:    Patient ID: Helen Mahoney, female    DOB: [birth year], 63 y.o.   MRN: [id number]  HPI Patient is in for pelvic and breast exam. Recent labs are reviewed and are excellent , no med changes at this time Flu vaccine needed and is administered  Review of Systems See HPI    Objective:   Physical Exam BP 122/74 mmHg  Pulse 97  Resp 16  Ht 5\' 6"  (1.676 m)  Wt 244 lb 6.4 oz (110.859 kg)  BMI 39.47 kg/m2  SpO2 97%   Pleasant obese female, alert and oriented x 3, in no cardio-pulmonary distress. Afebrile. HEENT No facial trauma or asymetry. Sinuses non tender.  Extra occullar muscles intact, pupils equally reactive to light. External ears normal, tympanic membranes clear. Oropharynx moist, no exudate, upper and lower dentures Neck: supple, no adenopathy,JVD or thyromegaly.No bruits.  Chest: Clear to ascultation bilaterally.No crackles or wheezes. Non tender to palpation  Breast: No asymetry,no masses or lumps. No tenderness. No nipple discharge or inversion. No axillary or supraclavicular adenopathy  Cardiovascular system; Heart sounds normal,  S1 and  S2 ,no S3.  No murmur, or thrill. Apical beat not displaced Peripheral pulses normal.  Abdomen: Soft, non tender, no organomegaly or masses. No bruits. Bowel sounds normal. No guarding, tenderness or rebound.  Rectal:  Normal sphincter tone. No mass.No rectal masses.  Guaiac negative stool.  GU: External genitalia normal female genitalia , female distribution of hair. No lesions. Urethral meatus normal in size, no  Prolapse, no lesions visibly  Present. Bladder non tender. Vagina pink and moist , with no visible lesions , discharge present . Adequate pelvic support no  cystocele or rectocele noted Cervix pink and appears healthy, no lesions or ulcerations noted, no discharge noted from os Uterus normal size, no adnexal masses, no cervical motion or adnexal tenderness.   Musculoskeletal  exam: Full ROM of spine, hips , shoulders and knees. No deformity ,swelling or crepitus noted. No muscle wasting or atrophy.   Neurologic: Cranial nerves 2 to 12 intact. Power, tone ,sensation and reflexes normal throughout. No disturbance in gait. No tremor.  Skin: Intact, no ulceration, erythema , scaling or rash noted. Pigmentation normal throughout  Psych; Normal mood and affect. Judgement and concentration normal        Assessment & Plan:  Encounter for annual physical exam Annual exam as documented. Counseling done  re healthy lifestyle involving commitment to 150 minutes exercise per week, heart healthy diet, and attaining healthy weight.The importance of adequate sleep also discussed. Regular seat belt use and home safety, is also discussed. Changes in health habits are decided on by the patient with goals and time frames  set for achieving them. Immunization and cancer screening needs are specifically addressed at this visit.   Need for prophylactic vaccination and inoculation against influenza Vaccine administered at visit.

## 2014-03-14 NOTE — Assessment & Plan Note (Signed)
Vaccine administered at visit.  

## 2014-03-15 LAB — CYTOLOGY - PAP

## 2014-07-08 LAB — BASIC METABOLIC PANEL WITH GFR
BUN: 19 mg/dL (ref 6–23)
CO2: 23 meq/L (ref 19–32)
Calcium: 9.2 mg/dL (ref 8.4–10.5)
Chloride: 104 mEq/L (ref 96–112)
Creat: 0.99 mg/dL (ref 0.50–1.10)
GFR, EST AFRICAN AMERICAN: 70 mL/min
GFR, EST NON AFRICAN AMERICAN: 60 mL/min
Glucose, Bld: 102 mg/dL — ABNORMAL HIGH (ref 70–99)
Potassium: 5.2 mEq/L (ref 3.5–5.3)
SODIUM: 139 meq/L (ref 135–145)

## 2014-07-08 LAB — HEMOGLOBIN A1C
HEMOGLOBIN A1C: 7.4 % — AB (ref <5.7)
Mean Plasma Glucose: 166 mg/dL — ABNORMAL HIGH (ref <117)

## 2014-07-13 ENCOUNTER — Ambulatory Visit (HOSPITAL_COMMUNITY)
Admission: RE | Admit: 2014-07-13 | Discharge: 2014-07-13 | Disposition: A | Discharge status: Home / Self Care | Payer: BLUE CROSS/BLUE SHIELD | Source: Ambulatory Visit | Attending: Family Medicine | Admitting: Family Medicine

## 2014-07-13 ENCOUNTER — Encounter: Payer: Self-pay | Admitting: Family Medicine

## 2014-07-13 ENCOUNTER — Ambulatory Visit (INDEPENDENT_AMBULATORY_CARE_PROVIDER_SITE_OTHER): Payer: BLUE CROSS/BLUE SHIELD | Admitting: Family Medicine

## 2014-07-13 VITALS — BP 122/78 | HR 92 | Resp 16 | Ht 66.0 in | Wt 245.0 lb

## 2014-07-13 DIAGNOSIS — E559 Vitamin D deficiency, unspecified: Secondary | ICD-10-CM

## 2014-07-13 DIAGNOSIS — Z23 Encounter for immunization: Secondary | ICD-10-CM | POA: Diagnosis not present

## 2014-07-13 DIAGNOSIS — IMO0001 Reserved for inherently not codable concepts without codable children: Secondary | ICD-10-CM

## 2014-07-13 DIAGNOSIS — E1065 Type 1 diabetes mellitus with hyperglycemia: Secondary | ICD-10-CM

## 2014-07-13 DIAGNOSIS — E1165 Type 2 diabetes mellitus with hyperglycemia: Principal | ICD-10-CM

## 2014-07-13 DIAGNOSIS — M25561 Pain in right knee: Secondary | ICD-10-CM | POA: Insufficient documentation

## 2014-07-13 DIAGNOSIS — E669 Obesity, unspecified: Secondary | ICD-10-CM

## 2014-07-13 DIAGNOSIS — Z794 Long term (current) use of insulin: Principal | ICD-10-CM

## 2014-07-13 DIAGNOSIS — I10 Essential (primary) hypertension: Secondary | ICD-10-CM

## 2014-07-13 DIAGNOSIS — Z1159 Encounter for screening for other viral diseases: Secondary | ICD-10-CM

## 2014-07-13 DIAGNOSIS — E785 Hyperlipidemia, unspecified: Secondary | ICD-10-CM | POA: Diagnosis not present

## 2014-07-13 NOTE — Progress Notes (Signed)
Subjective:    Patient ID: Helen Mahoney, female    DOB: [birth year], 64 y.o.   MRN: [id number]  HPI The PT is here for follow up and re-evaluation of chronic medical conditions, medication management and review of any available recent lab and radiology data.  Preventive health is updated, specifically  Cancer screening and Immunization.    The PT denies any adverse reactions to current medications since the last visit.  [place] in Nov 2015, accidentally tripped, fell forward, has new pain in right knee localized Fasting sugars are logged around 80 but hBa1C is elevated      Review of Systems See HPI Denies recent fever or chills. Denies sinus pressure, nasal congestion, ear pain or sore throat. Denies chest congestion, productive cough or wheezing. Denies chest pains, palpitations and leg swelling Denies abdominal pain, nausea, vomiting,diarrhea or constipation.   Denies dysuria, frequency, hesitancy or incontinence. . Denies headaches, seizures, numbness, or tingling. Denies depression, anxiety or insomnia. Denies skin break down or rash.        Objective:   Physical Exam BP 122/78 mmHg  Pulse 92  Resp 16  Ht 5\' 6"  (1.676 m)  Wt 245 lb (111.131 kg)  BMI 39.56 kg/m2  SpO2 97% Patient alert and oriented and in no cardiopulmonary distress.  HEENT: No facial asymmetry, EOMI,   oropharynx pink and moist.  Neck supple no JVD, no mass.  Chest: Clear to auscultation bilaterally.  CVS: S1, S2 no murmurs, no S3.Regular rate.  ABD: Soft non tender.   Ext: No edema  MS: Adequate ROM spine, shoulders, hips and knees.Tender over medial aspect of traumatized knee  Skin: Intact, no ulcerations or rash noted.  Psych: Good eye contact, normal affect. Memory intact not anxious or depressed appearing.  CNS: CN 2-12 intact, power,  normal throughout.no focal deficits noted.        Assessment & Plan:  Diabetes mellitus, insulin dependent (IDDM),  uncontrolled Deteriorated, need to increase testing to 3 times daily and check meter Patient educated about the importance of limiting  Carbohydrate intake , the need to commit to daily physical activity for a minimum of 30 minutes , and to commit weight loss. The fact that changes in all these areas will reduce or eliminate all together the development of diabetes is stressed.   Diabetic Labs Latest Ref Rng 07/08/2014 02/02/2014 10/02/2013 07/16/2013 04/02/2013  HbA1c <5.7 % 7.4(H) 7.1(H) 7.1(H) 7.9(H) 7.6(H)  Microalbumin 0.00 - 1.89 mg/dL - - 1.25 - -  Micro/Creat Ratio 0.0 - 30.0 mg/g - - 11.0 - -  Chol 0 - 200 mg/dL - 162 174 - 154  HDL >39 mg/dL - 56 44 - 46  Calc LDL 0 - 99 mg/dL - 93 117(H) - 94  Triglycerides <150 mg/dL - 67 65 - 72  Creatinine 0.50 - 1.10 mg/dL 0.99 0.86 0.97 0.81 0.86   BP/Weight 07/13/2014 03/14/2014 10/06/2013 07/21/2013 04/06/2013 12/21/2012 9/32/3557  Systolic BP 322 025 427 062 376 283 151  Diastolic BP 78 74 82 78 82 68 80  Wt. (Lbs) 245 244.4 243 242.4 244.08 247.12 245.8  BMI 39.56 39.47 39.24 39.14 39.41 39.91 39.69   Foot/eye exam completion dates Latest Ref Rng 10/06/2013 09/22/2013  Eye Exam No Retinopathy - No Retinopathy  Foot exam Order - - -  Foot Form Completion - Done -        Essential hypertension Controlled, no change in medication DASH diet and commitment to daily physical activity for a minimum  of 30 minutes discussed and encouraged, as a part of hypertension management. The importance of attaining a healthy weight is also discussed.  BP/Weight 07/13/2014 03/14/2014 10/06/2013 07/21/2013 04/06/2013 12/21/2012 2/63/3354  Systolic BP 562 563 893 734 287 681 157  Diastolic BP 78 74 82 78 82 68 80  Wt. (Lbs) 245 244.4 243 242.4 244.08 247.12 245.8  BMI 39.56 39.47 39.24 39.14 39.41 39.91 39.69    CMP Latest Ref Rng 07/08/2014 02/02/2014 10/02/2013  Glucose 70 - 99 mg/dL 102(H) 110(H) 87  BUN 6 - 23 mg/dL 19 14 15   Creatinine 0.50 - 1.10 mg/dL  0.99 0.86 0.97  Sodium 135 - 145 mEq/L 139 140 136  Potassium 3.5 - 5.3 mEq/L 5.2 4.5 4.6  Chloride 96 - 112 mEq/L 104 105 105  CO2 19 - 32 mEq/L 23 25 24   Calcium 8.4 - 10.5 mg/dL 9.2 8.8 9.0  Total Protein 6.0 - 8.3 g/dL - 6.6 6.4  Total Bilirubin 0.2 - 1.2 mg/dL - 0.3 0.3  Alkaline Phos 39 - 117 U/L - 116 98  AST 0 - 37 U/L - 15 15  ALT 0 - 35 U/L - 12 15        Hyperlipemia Updated lab needed at/ before next visit. Hyperlipidemia:Low fat diet discussed and encouraged.  CMP Latest Ref Rng 07/08/2014 02/02/2014 10/02/2013  Glucose 70 - 99 mg/dL 102(H) 110(H) 87  BUN 6 - 23 mg/dL 19 14 15   Creatinine 0.50 - 1.10 mg/dL 0.99 0.86 0.97  Sodium 135 - 145 mEq/L 139 140 136  Potassium 3.5 - 5.3 mEq/L 5.2 4.5 4.6  Chloride 96 - 112 mEq/L 104 105 105  CO2 19 - 32 mEq/L 23 25 24   Calcium 8.4 - 10.5 mg/dL 9.2 8.8 9.0  Total Protein 6.0 - 8.3 g/dL - 6.6 6.4  Total Bilirubin 0.2 - 1.2 mg/dL - 0.3 0.3  Alkaline Phos 39 - 117 U/L - 116 98  AST 0 - 37 U/L - 15 15  ALT 0 - 35 U/L - 12 15    Lipid Panel     Component Value Date/Time   CHOL 162 02/02/2014 0839   TRIG 67 02/02/2014 0839   HDL 56 02/02/2014 0839   CHOLHDL 2.9 02/02/2014 0839   VLDL 13 02/02/2014 0839   LDLCALC 93 02/02/2014 0839         Knee pain, right Increased following fall approx 3 weeks ago with point tenderness, xray of joint today   Need for vaccination with 13-polyvalent pneumococcal conjugate vaccine After obtaining informed consent, the vaccine is  administered by LPN.    Obesity Deteriorated. Patient re-educated about  the importance of commitment to a  minimum of 150 minutes of exercise per week.  The importance of healthy food choices with portion control discussed. Encouraged to start a food diary, count calories and to consider  joining a support group. Sample diet sheets offered. Goals set by the patient for the next several months.   Wt Readings from Last 3 Encounters:  07/13/14 245  lb (111.131 kg)  03/14/14 244 lb 6.4 oz (110.859 kg)  10/06/13 243 lb (110.224 kg)    Body mass index is 39.56 kg/(m^2).  Current exercise per week 30 minutes.

## 2014-07-13 NOTE — Patient Instructions (Signed)
F/u  June 20 or after, call if you need me before  Prevnar today  Xray of right knee today  New directions to test 3 times weekly and meter check with nurse as we discussed  It is important that you exercise regularly at least 30 minutes 5 times a week. If you develop chest pain, have severe difficulty breathing, or feel very tired, stop exercising immediately and seek medical attention   A healthy diet is rich in fruit, vegetables and whole grains. Poultry fish, nuts and beans are a healthy choice for protein rather then red meat. A low sodium diet and drinking 64 ounces of water daily is generally recommended. Oils and sweet should be limited. Carbohydrates especially for those who are diabetic or overweight, should be limited to 30-45 gram per meal. It is important to eat on a regular schedule, at least 3 times daily. Snacks should be primarily fruits, vegetables or nuts.  Fasting labs June 15 or after

## 2014-07-13 NOTE — Assessment & Plan Note (Addendum)
Deteriorated, need to increase testing to 3 times daily and check meter Patient educated about the importance of limiting  Carbohydrate intake , the need to commit to daily physical activity for a minimum of 30 minutes , and to commit weight loss. The fact that changes in all these areas will reduce or eliminate all together the development of diabetes is stressed.   Diabetic Labs Latest Ref Rng 07/08/2014 02/02/2014 10/02/2013 07/16/2013 04/02/2013  HbA1c <5.7 % 7.4(H) 7.1(H) 7.1(H) 7.9(H) 7.6(H)  Microalbumin 0.00 - 1.89 mg/dL - - 1.25 - -  Micro/Creat Ratio 0.0 - 30.0 mg/g - - 11.0 - -  Chol 0 - 200 mg/dL - 162 174 - 154  HDL >39 mg/dL - 56 44 - 46  Calc LDL 0 - 99 mg/dL - 93 117(H) - 94  Triglycerides <150 mg/dL - 67 65 - 72  Creatinine 0.50 - 1.10 mg/dL 0.99 0.86 0.97 0.81 0.86   BP/Weight 07/13/2014 03/14/2014 10/06/2013 07/21/2013 04/06/2013 12/21/2012 9/32/3557  Systolic BP 322 025 427 062 376 283 151  Diastolic BP 78 74 82 78 82 68 80  Wt. (Lbs) 245 244.4 243 242.4 244.08 247.12 245.8  BMI 39.56 39.47 39.24 39.14 39.41 39.91 39.69   Foot/eye exam completion dates Latest Ref Rng 10/06/2013 09/22/2013  Eye Exam No Retinopathy - No Retinopathy  Foot exam Order - - -  Foot Form Completion - Done -

## 2014-07-25 NOTE — Assessment & Plan Note (Signed)
Increased following fall approx 3 weeks ago with point tenderness, xray of joint today

## 2014-07-25 NOTE — Assessment & Plan Note (Signed)
Deteriorated. Patient re-educated about  the importance of commitment to a  minimum of 150 minutes of exercise per week.  The importance of healthy food choices with portion control discussed. Encouraged to start a food diary, count calories and to consider  joining a support group. Sample diet sheets offered. Goals set by the patient for the next several months.   Wt Readings from Last 3 Encounters:  07/13/14 245 lb (111.131 kg)  03/14/14 244 lb 6.4 oz (110.859 kg)  10/06/13 243 lb (110.224 kg)    Body mass index is 39.56 kg/(m^2).  Current exercise per week 30 minutes.

## 2014-07-25 NOTE — Assessment & Plan Note (Signed)
Updated lab needed at/ before next visit. Hyperlipidemia:Low fat diet discussed and encouraged.  CMP Latest Ref Rng 07/08/2014 02/02/2014 10/02/2013  Glucose 70 - 99 mg/dL 102(H) 110(H) 87  BUN 6 - 23 mg/dL 19 14 15   Creatinine 0.50 - 1.10 mg/dL 0.99 0.86 0.97  Sodium 135 - 145 mEq/L 139 140 136  Potassium 3.5 - 5.3 mEq/L 5.2 4.5 4.6  Chloride 96 - 112 mEq/L 104 105 105  CO2 19 - 32 mEq/L 23 25 24   Calcium 8.4 - 10.5 mg/dL 9.2 8.8 9.0  Total Protein 6.0 - 8.3 g/dL - 6.6 6.4  Total Bilirubin 0.2 - 1.2 mg/dL - 0.3 0.3  Alkaline Phos 39 - 117 U/L - 116 98  AST 0 - 37 U/L - 15 15  ALT 0 - 35 U/L - 12 15    Lipid Panel     Component Value Date/Time   CHOL 162 02/02/2014 0839   TRIG 67 02/02/2014 0839   HDL 56 02/02/2014 0839   CHOLHDL 2.9 02/02/2014 0839   VLDL 13 02/02/2014 0839   LDLCALC 93 02/02/2014 0839

## 2014-07-25 NOTE — Assessment & Plan Note (Signed)
After obtaining informed consent, the vaccine is  administered by LPN.  

## 2014-07-25 NOTE — Assessment & Plan Note (Signed)
Controlled, no change in medication DASH diet and commitment to daily physical activity for a minimum of 30 minutes discussed and encouraged, as a part of hypertension management. The importance of attaining a healthy weight is also discussed.  BP/Weight 07/13/2014 03/14/2014 10/06/2013 07/21/2013 04/06/2013 12/21/2012 4/51/4604  Systolic BP 799 872 158 727 618 485 927  Diastolic BP 78 74 82 78 82 68 80  Wt. (Lbs) 245 244.4 243 242.4 244.08 247.12 245.8  BMI 39.56 39.47 39.24 39.14 39.41 39.91 39.69    CMP Latest Ref Rng 07/08/2014 02/02/2014 10/02/2013  Glucose 70 - 99 mg/dL 102(H) 110(H) 87  BUN 6 - 23 mg/dL 19 14 15   Creatinine 0.50 - 1.10 mg/dL 0.99 0.86 0.97  Sodium 135 - 145 mEq/L 139 140 136  Potassium 3.5 - 5.3 mEq/L 5.2 4.5 4.6  Chloride 96 - 112 mEq/L 104 105 105  CO2 19 - 32 mEq/L 23 25 24   Calcium 8.4 - 10.5 mg/dL 9.2 8.8 9.0  Total Protein 6.0 - 8.3 g/dL - 6.6 6.4  Total Bilirubin 0.2 - 1.2 mg/dL - 0.3 0.3  Alkaline Phos 39 - 117 U/L - 116 98  AST 0 - 37 U/L - 15 15  ALT 0 - 35 U/L - 12 15

## 2014-09-18 ENCOUNTER — Other Ambulatory Visit: Payer: Self-pay | Admitting: Family Medicine

## 2014-10-15 ENCOUNTER — Other Ambulatory Visit: Payer: Self-pay | Admitting: Family Medicine

## 2014-10-16 LAB — TSH: TSH: 2.536 u[IU]/mL (ref 0.350–4.500)

## 2014-10-16 LAB — LIPID PANEL
Cholesterol: 157 mg/dL (ref 0–200)
HDL: 58 mg/dL (ref >=46)
LDL CALC: 88 mg/dL (ref 0–99)
Total CHOL/HDL Ratio: 2.7 Ratio
Triglycerides: 57 mg/dL (ref <150)
VLDL: 11 mg/dL (ref 0–40)

## 2014-10-16 LAB — COMPLETE METABOLIC PANEL WITH GFR
ALK PHOS: 101 U/L (ref 39–117)
ALT: 14 U/L (ref 0–35)
AST: 15 U/L (ref 0–37)
Albumin: 3.4 g/dL — ABNORMAL LOW (ref 3.5–5.2)
BUN: 14 mg/dL (ref 6–23)
CALCIUM: 8.8 mg/dL (ref 8.4–10.5)
CHLORIDE: 106 meq/L (ref 96–112)
CO2: 24 mEq/L (ref 19–32)
Creat: 0.8 mg/dL (ref 0.50–1.10)
GFR, EST NON AFRICAN AMERICAN: 78 mL/min
Glucose, Bld: 72 mg/dL (ref 70–99)
Potassium: 4.5 mEq/L (ref 3.5–5.3)
Sodium: 142 mEq/L (ref 135–145)
Total Bilirubin: 0.3 mg/dL (ref 0.2–1.2)
Total Protein: 6.5 g/dL (ref 6.0–8.3)

## 2014-10-16 LAB — CBC
HEMATOCRIT: 32.2 % — AB (ref 36.0–46.0)
Hemoglobin: 9.6 g/dL — ABNORMAL LOW (ref 12.0–15.0)
MCH: 20.5 pg — AB (ref 26.0–34.0)
MCHC: 29.8 g/dL — ABNORMAL LOW (ref 30.0–36.0)
MCV: 68.8 fL — AB (ref 78.0–100.0)
MPV: 9.2 fL (ref 8.6–12.4)
PLATELETS: 391 10*3/uL (ref 150–400)
RBC: 4.68 MIL/uL (ref 3.87–5.11)
RDW: 17.8 % — ABNORMAL HIGH (ref 11.5–15.5)
WBC: 7.4 10*3/uL (ref 4.0–10.5)

## 2014-10-16 LAB — HIV ANTIBODY (ROUTINE TESTING W REFLEX): HIV 1&2 Ab, 4th Generation: NONREACTIVE

## 2014-10-16 LAB — VITAMIN D 25 HYDROXY (VIT D DEFICIENCY, FRACTURES): Vit D, 25-Hydroxy: 18 ng/mL — ABNORMAL LOW (ref 30–100)

## 2014-10-16 LAB — HEMOGLOBIN A1C
HEMOGLOBIN A1C: 7.4 % — AB (ref <5.7)
MEAN PLASMA GLUCOSE: 166 mg/dL — AB (ref <117)

## 2014-10-16 LAB — MICROALBUMIN / CREATININE URINE RATIO
CREATININE, URINE: 127.5 mg/dL
Microalb Creat Ratio: 8.6 mg/g (ref 0.0–30.0)
Microalb, Ur: 1.1 mg/dL (ref <2.0)

## 2014-10-17 LAB — VITAMIN B12: Vitamin B-12: 255 pg/mL (ref 211–911)

## 2014-10-17 LAB — FERRITIN: FERRITIN: 31 ng/mL (ref 10–291)

## 2014-10-17 LAB — IRON: IRON: 49 ug/dL (ref 42–145)

## 2014-10-18 ENCOUNTER — Ambulatory Visit (INDEPENDENT_AMBULATORY_CARE_PROVIDER_SITE_OTHER): Payer: BLUE CROSS/BLUE SHIELD | Admitting: Family Medicine

## 2014-10-18 ENCOUNTER — Encounter: Payer: Self-pay | Admitting: Family Medicine

## 2014-10-18 VITALS — BP 124/78 | HR 97 | Resp 16 | Ht 66.0 in | Wt 248.0 lb

## 2014-10-18 DIAGNOSIS — D509 Iron deficiency anemia, unspecified: Secondary | ICD-10-CM

## 2014-10-18 DIAGNOSIS — E785 Hyperlipidemia, unspecified: Secondary | ICD-10-CM

## 2014-10-18 DIAGNOSIS — IMO0001 Reserved for inherently not codable concepts without codable children: Secondary | ICD-10-CM

## 2014-10-18 DIAGNOSIS — E1065 Type 1 diabetes mellitus with hyperglycemia: Secondary | ICD-10-CM | POA: Diagnosis not present

## 2014-10-18 DIAGNOSIS — I10 Essential (primary) hypertension: Secondary | ICD-10-CM | POA: Diagnosis not present

## 2014-10-18 DIAGNOSIS — E109 Type 1 diabetes mellitus without complications: Secondary | ICD-10-CM | POA: Diagnosis not present

## 2014-10-18 DIAGNOSIS — E119 Type 2 diabetes mellitus without complications: Secondary | ICD-10-CM

## 2014-10-18 DIAGNOSIS — E1165 Type 2 diabetes mellitus with hyperglycemia: Principal | ICD-10-CM

## 2014-10-18 DIAGNOSIS — M25561 Pain in right knee: Secondary | ICD-10-CM

## 2014-10-18 DIAGNOSIS — E559 Vitamin D deficiency, unspecified: Secondary | ICD-10-CM

## 2014-10-18 DIAGNOSIS — Z794 Long term (current) use of insulin: Principal | ICD-10-CM

## 2014-10-18 MED ORDER — ERGOCALCIFEROL 1.25 MG (50000 UT) PO CAPS: 50000.0000 [IU] | ORAL_CAPSULE | ORAL | Status: DC

## 2014-10-18 MED ORDER — GLIPIZIDE 10 MG PO TABS: 10.0000 mg | ORAL_TABLET | Freq: Two times a day (BID) | ORAL | Status: DC

## 2014-10-18 MED ORDER — IRON 325 (65 FE) MG PO TABS: 1.0000 | ORAL_TABLET | Freq: Every day | ORAL | Status: DC

## 2014-10-18 NOTE — Patient Instructions (Signed)
Annual Physical exam in 5.5 month, call if you need me sooner  It is important that you exercise regularly at least 30 minutes 5 times a week. If you develop chest pain, have severe difficulty breathing, or feel very tired, stop exercising immediately and seek medical attention    Please trade candy for vegetable and limit fruit, and only drink water  New meds once weekly vitamin D, 1 year  Iron 325 mg once daily  Thanks for choosing [facility], we consider it a privelige to serve you.   You are referred for eye exam  Mammogram due in October , please schedule in September

## 2014-10-18 NOTE — Assessment & Plan Note (Signed)
Controlled, no change in medication Helen Mahoney is reminded of the importance of commitment to daily physical activity for 30 minutes or more, as able and the need to limit carbohydrate intake to 30 to 60 grams per meal to help with blood sugar control.   The need to take medication as prescribed, test blood sugar as directed, and to call between visits if there is a concern that blood sugar is uncontrolled is also discussed.   Helen Mahoney is reminded of the importance of daily foot exam, annual eye examination, and good blood sugar, blood pressure and cholesterol control.  Diabetic Labs Latest Ref Rng 10/15/2014 07/08/2014 02/02/2014 10/02/2013 07/16/2013  HbA1c <5.7 % 7.4(H) 7.4(H) 7.1(H) 7.1(H) 7.9(H)  Microalbumin <2.0 mg/dL 1.1 - - 1.25 -  Micro/Creat Ratio 0.0 - 30.0 mg/g 8.6 - - 11.0 -  Chol 0 - 200 mg/dL 157 - 162 174 -  HDL >=46 mg/dL 58 - 56 44 -  Calc LDL 0 - 99 mg/dL 88 - 93 117(H) -  Triglycerides <150 mg/dL 57 - 67 65 -  Creatinine 0.50 - 1.10 mg/dL 0.80 0.99 0.86 0.97 0.81   BP/Weight 10/18/2014 07/13/2014 03/14/2014 10/06/2013 07/21/2013 04/06/2013 06/10/2480  Systolic BP 500 370 488 891 694 503 888  Diastolic BP 78 78 74 82 78 82 68  Wt. (Lbs) 248 245 244.4 243 242.4 244.08 247.12  BMI 40.05 39.56 39.47 39.24 39.14 39.41 39.91   Foot/eye exam completion dates Latest Ref Rng 10/18/2014 10/06/2013  Eye Exam No Retinopathy - -  Foot exam Order - - -  Foot Form Completion - Done Done   Controlled, no change in medication Helen Mahoney is reminded of the importance of commitment to daily physical activity for 30 minutes or more, as able and the need to limit carbohydrate intake to 30 to 60 grams per meal to help with blood sugar control.   The need to take medication as prescribed, test blood sugar as directed, and to call between visits if there is a concern that blood sugar is uncontrolled is also discussed.   Helen Mahoney is reminded of the importance of daily foot exam, annual eye  examination, and good blood sugar, blood pressure and cholesterol control.  Diabetic Labs Latest Ref Rng 10/15/2014 07/08/2014 02/02/2014 10/02/2013 07/16/2013  HbA1c <5.7 % 7.4(H) 7.4(H) 7.1(H) 7.1(H) 7.9(H)  Microalbumin <2.0 mg/dL 1.1 - - 1.25 -  Micro/Creat Ratio 0.0 - 30.0 mg/g 8.6 - - 11.0 -  Chol 0 - 200 mg/dL 157 - 162 174 -  HDL >=46 mg/dL 58 - 56 44 -  Calc LDL 0 - 99 mg/dL 88 - 93 117(H) -  Triglycerides <150 mg/dL 57 - 67 65 -  Creatinine 0.50 - 1.10 mg/dL 0.80 0.99 0.86 0.97 0.81   BP/Weight 10/18/2014 07/13/2014 03/14/2014 10/06/2013 07/21/2013 04/06/2013 2/80/0349  Systolic BP 179 150 569 794 801 655 374  Diastolic BP 78 78 74 82 78 82 68  Wt. (Lbs) 248 245 244.4 243 242.4 244.08 247.12  BMI 40.05 39.56 39.47 39.24 39.14 39.41 39.91   Foot/eye exam completion dates Latest Ref Rng 10/18/2014 10/06/2013  Eye Exam No Retinopathy - -  Foot exam Order - - -  Foot Form Completion - Done Done

## 2014-10-18 NOTE — Assessment & Plan Note (Addendum)
Unchanged Patient re-educated about  the importance of commitment to a  minimum of 150 minutes of exercise per week.  The importance of healthy food choices with portion control discussed. Encouraged to start a food diary, count calories and to consider  joining a support group. Sample diet sheets offered. Goals set by the patient for the next several months.   Weight /BMI 10/18/2014 07/13/2014 03/14/2014  WEIGHT 248 lb 245 lb 244 lb 6.4 oz  HEIGHT 5\' 6"  5\' 6"  5\' 6"   BMI 40.05 kg/m2 39.56 kg/m2 39.47 kg/m2    Current exercise per week 0 minutes.

## 2014-10-31 NOTE — Assessment & Plan Note (Signed)
Controlled, no change in medication DASH diet and commitment to daily physical activity for a minimum of 30 minutes discussed and encouraged, as a part of hypertension management. The importance of attaining a healthy weight is also discussed.  BP/Weight 10/18/2014 07/13/2014 03/14/2014 10/06/2013 07/21/2013 04/06/2013 0/56/9794  Systolic BP 801 655 374 827 078 675 449  Diastolic BP 78 78 74 82 78 82 68  Wt. (Lbs) 248 245 244.4 243 242.4 244.08 247.12  BMI 40.05 39.56 39.47 39.24 39.14 39.41 39.91

## 2014-10-31 NOTE — Assessment & Plan Note (Signed)
Start daily iron supplement and review in 5 months

## 2014-10-31 NOTE — Assessment & Plan Note (Signed)
Hyperlipidemia:Low fat diet discussed and encouraged.   Lipid Panel  Lab Results  Component Value Date   CHOL 157 10/15/2014   HDL 58 10/15/2014   LDLCALC 88 10/15/2014   TRIG 57 10/15/2014   CHOLHDL 2.7 10/15/2014      Controlled, no change in medication

## 2014-10-31 NOTE — Assessment & Plan Note (Signed)
Week;ly vit D x 12 months then review

## 2014-10-31 NOTE — Progress Notes (Signed)
Helen Mahoney     MRN: [id number]      DOB: Nov 07, 1950   HPI Helen Mahoney is here for follow up and re-evaluation of chronic medical conditions, medication management and review of any available recent lab and radiology data.  Preventive health is updated, specifically  Cancer screening and Immunization.   Questions or concerns regarding consultations or procedures which the PT has had in the interim are  addressed. The PT denies any adverse reactions to current medications since the last visit.  States knee pain is slightly better Denies polyuria, polydipsia, blurred vision , or hypoglycemic episodes.    ROS Denies recent fever or chills. Denies sinus pressure, nasal congestion, ear pain or sore throat. Denies chest congestion, productive cough or wheezing. Denies chest pains, palpitations and leg swelling Denies abdominal pain, nausea, vomiting,diarrhea or constipation.   Denies dysuria, frequency, hesitancy or incontinence. C/o  joint pain, swelling and limitation in mobility of knee , otherwise good Denies headaches, seizures, numbness, or tingling. Denies depression, anxiety or insomnia. Denies skin break down or rash.   PE  BP 124/78 mmHg  Pulse 97  Resp 16  Ht 5\' 6"  (1.676 m)  Wt 248 lb (112.492 kg)  BMI 40.05 kg/m2  SpO2 98%  Patient alert and oriented and in no cardiopulmonary distress.  HEENT: No facial asymmetry, EOMI,   oropharynx pink and moist.  Neck supple no JVD, no mass.  Chest: Clear to auscultation bilaterally.  CVS: S1, S2 no murmurs, no S3.Regular rate.  ABD: Soft non tender.   Ext: No edema  MS: Adequate ROM spine, shoulders, hips and reduced in  knee.  Skin: Intact, no ulcerations or rash noted.  Psych: Good eye contact, normal affect. Memory intact not anxious or depressed appearing.  CNS: CN 2-12 intact, power,  normal throughout.no focal deficits noted.   Assessment & Plan   Diabetes mellitus, insulin dependent (IDDM),  controlled Controlled, no change in medication Helen Mahoney is reminded of the importance of commitment to daily physical activity for 30 minutes or more, as able and the need to limit carbohydrate intake to 30 to 60 grams per meal to help with blood sugar control.   The need to take medication as prescribed, test blood sugar as directed, and to call between visits if there is a concern that blood sugar is uncontrolled is also discussed.   Helen Mahoney is reminded of the importance of daily foot exam, annual eye examination, and good blood sugar, blood pressure and cholesterol control.  Diabetic Labs Latest Ref Rng 10/15/2014 07/08/2014 02/02/2014 10/02/2013 07/16/2013  HbA1c <5.7 % 7.4(H) 7.4(H) 7.1(H) 7.1(H) 7.9(H)  Microalbumin <2.0 mg/dL 1.1 - - 1.25 -  Micro/Creat Ratio 0.0 - 30.0 mg/g 8.6 - - 11.0 -  Chol 0 - 200 mg/dL 157 - 162 174 -  HDL >=46 mg/dL 58 - 56 44 -  Calc LDL 0 - 99 mg/dL 88 - 93 117(H) -  Triglycerides <150 mg/dL 57 - 67 65 -  Creatinine 0.50 - 1.10 mg/dL 0.80 0.99 0.86 0.97 0.81   BP/Weight 10/18/2014 07/13/2014 03/14/2014 10/06/2013 07/21/2013 04/06/2013 08/05/8117  Systolic BP 147 829 562 130 865 784 696  Diastolic BP 78 78 74 82 78 82 68  Wt. (Lbs) 248 245 244.4 243 242.4 244.08 247.12  BMI 40.05 39.56 39.47 39.24 39.14 39.41 39.91   Foot/eye exam completion dates Latest Ref Rng 10/18/2014 10/06/2013  Eye Exam No Retinopathy - -  Foot exam Order - - -  Foot Form Completion - Done Done   Controlled, no change in medication Helen Mahoney is reminded of the importance of commitment to daily physical activity for 30 minutes or more, as able and the need to limit carbohydrate intake to 30 to 60 grams per meal to help with blood sugar control.   The need to take medication as prescribed, test blood sugar as directed, and to call between visits if there is a concern that blood sugar is uncontrolled is also discussed.   Helen Mahoney is reminded of the importance of daily foot exam,  annual eye examination, and good blood sugar, blood pressure and cholesterol control.  Diabetic Labs Latest Ref Rng 10/15/2014 07/08/2014 02/02/2014 10/02/2013 07/16/2013  HbA1c <5.7 % 7.4(H) 7.4(H) 7.1(H) 7.1(H) 7.9(H)  Microalbumin <2.0 mg/dL 1.1 - - 1.25 -  Micro/Creat Ratio 0.0 - 30.0 mg/g 8.6 - - 11.0 -  Chol 0 - 200 mg/dL 157 - 162 174 -  HDL >=46 mg/dL 58 - 56 44 -  Calc LDL 0 - 99 mg/dL 88 - 93 117(H) -  Triglycerides <150 mg/dL 57 - 67 65 -  Creatinine 0.50 - 1.10 mg/dL 0.80 0.99 0.86 0.97 0.81   BP/Weight 10/18/2014 07/13/2014 03/14/2014 10/06/2013 07/21/2013 04/06/2013 7/35/3299  Systolic BP 242 683 419 622 297 989 211  Diastolic BP 78 78 74 82 78 82 68  Wt. (Lbs) 248 245 244.4 243 242.4 244.08 247.12  BMI 40.05 39.56 39.47 39.24 39.14 39.41 39.91   Foot/eye exam completion dates Latest Ref Rng 10/18/2014 10/06/2013  Eye Exam No Retinopathy - -  Foot exam Order - - -  Foot Form Completion - Done Done            Morbid obesity Unchanged Patient re-educated about  the importance of commitment to a  minimum of 150 minutes of exercise per week.  The importance of healthy food choices with portion control discussed. Encouraged to start a food diary, count calories and to consider  joining a support group. Sample diet sheets offered. Goals set by the patient for the next several months.   Weight /BMI 10/18/2014 07/13/2014 03/14/2014  WEIGHT 248 lb 245 lb 244 lb 6.4 oz  HEIGHT 5\' 6"  5\' 6"  5\' 6"   BMI 40.05 kg/m2 39.56 kg/m2 39.47 kg/m2    Current exercise per week 0 minutes.   Essential hypertension Controlled, no change in medication DASH diet and commitment to daily physical activity for a minimum of 30 minutes discussed and encouraged, as a part of hypertension management. The importance of attaining a healthy weight is also discussed.  BP/Weight 10/18/2014 07/13/2014 03/14/2014 10/06/2013 07/21/2013 04/06/2013 9/41/7408  Systolic BP 144 818 563 149 702 637 858  Diastolic BP  78 78 74 82 78 82 68  Wt. (Lbs) 248 245 244.4 243 242.4 244.08 247.12  BMI 40.05 39.56 39.47 39.24 39.14 39.41 39.91        Hyperlipemia Hyperlipidemia:Low fat diet discussed and encouraged.   Lipid Panel  Lab Results  Component Value Date   CHOL 157 10/15/2014   HDL 58 10/15/2014   LDLCALC 88 10/15/2014   TRIG 57 10/15/2014   CHOLHDL 2.7 10/15/2014      Controlled, no change in medication   Vitamin D deficiency Week;ly vit D x 12 months then review  IDA (iron deficiency anemia) Start daily iron supplement and review in 5 months  Knee pain, right Ongoing pain and stiffness, conservative management, tylenol and weight loss and physical activity

## 2014-10-31 NOTE — Assessment & Plan Note (Signed)
Ongoing pain and stiffness, conservative management, tylenol and weight loss and physical activity

## 2014-11-08 ENCOUNTER — Telehealth: Payer: Self-pay | Admitting: Family Medicine

## 2014-11-08 NOTE — Telephone Encounter (Signed)
I spokth pt on her cell, she agrees to come inam early to discuss her sister [employer], [employer] gave me permission in office today to do this

## 2014-11-09 LAB — HM DIABETES EYE EXAM

## 2014-12-22 ENCOUNTER — Other Ambulatory Visit: Payer: Self-pay | Admitting: Family Medicine

## 2014-12-23 ENCOUNTER — Other Ambulatory Visit: Payer: Self-pay | Admitting: Family Medicine

## 2015-01-04 ENCOUNTER — Other Ambulatory Visit: Payer: Self-pay | Admitting: Family Medicine

## 2015-01-08 ENCOUNTER — Other Ambulatory Visit: Payer: Self-pay | Admitting: Family Medicine

## 2015-01-12 ENCOUNTER — Ambulatory Visit (INDEPENDENT_AMBULATORY_CARE_PROVIDER_SITE_OTHER): Payer: BLUE CROSS/BLUE SHIELD

## 2015-01-12 DIAGNOSIS — Z23 Encounter for immunization: Secondary | ICD-10-CM | POA: Diagnosis not present

## 2015-01-26 ENCOUNTER — Other Ambulatory Visit: Payer: Self-pay | Admitting: Family Medicine

## 2015-01-26 DIAGNOSIS — Z1231 Encounter for screening mammogram for malignant neoplasm of breast: Secondary | ICD-10-CM

## 2015-01-31 ENCOUNTER — Other Ambulatory Visit: Payer: Self-pay | Admitting: Family Medicine

## 2015-02-13 ENCOUNTER — Ambulatory Visit (HOSPITAL_COMMUNITY)
Admission: RE | Admit: 2015-02-13 | Discharge: 2015-02-13 | Disposition: A | Discharge status: Home / Self Care | Payer: BLUE CROSS/BLUE SHIELD | Source: Ambulatory Visit | Attending: Family Medicine | Admitting: Family Medicine

## 2015-02-13 DIAGNOSIS — Z1231 Encounter for screening mammogram for malignant neoplasm of breast: Secondary | ICD-10-CM | POA: Diagnosis present

## 2015-02-25 IMAGING — MG MM DIGITAL SCREENING
3 series · 3 of 3 positions shown · non-contrast
Comparison: Previous exam(s).

CLINICAL DATA: Screening.

EXAM:
DIGITAL SCREENING BILATERAL MAMMOGRAM WITH CAD

[L CC]
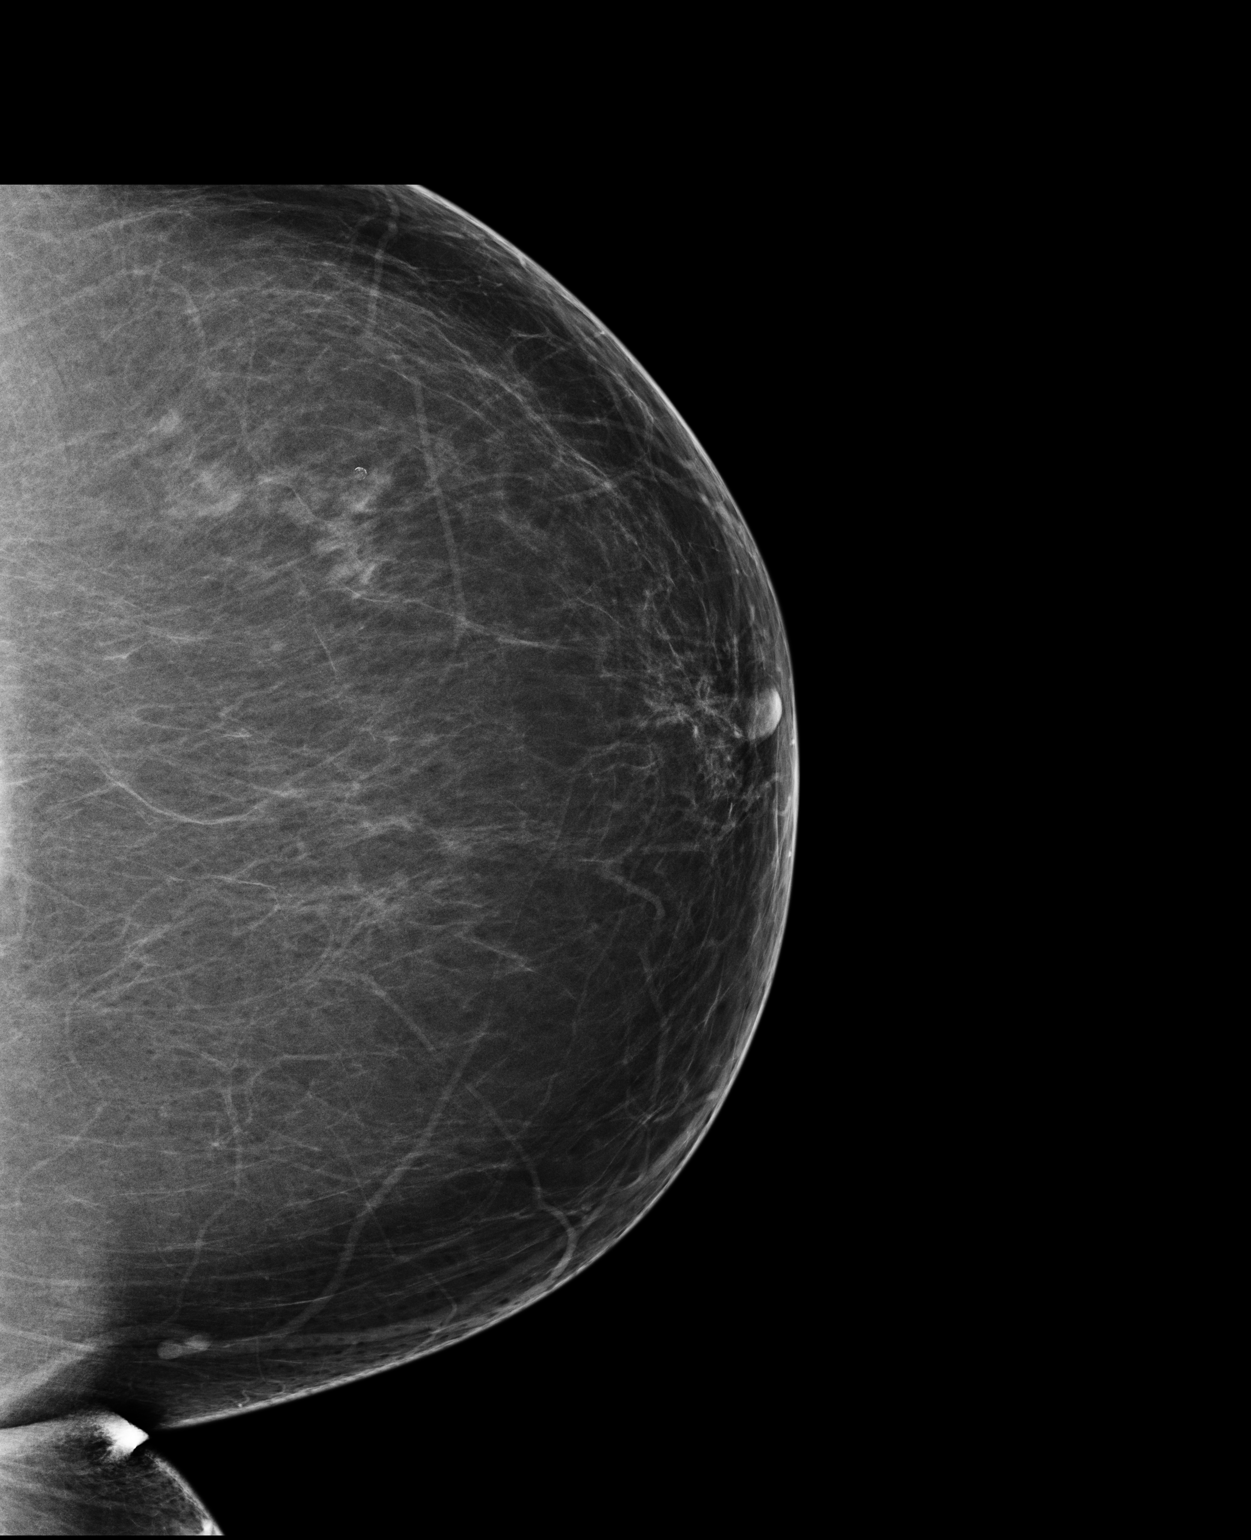

[R CC]
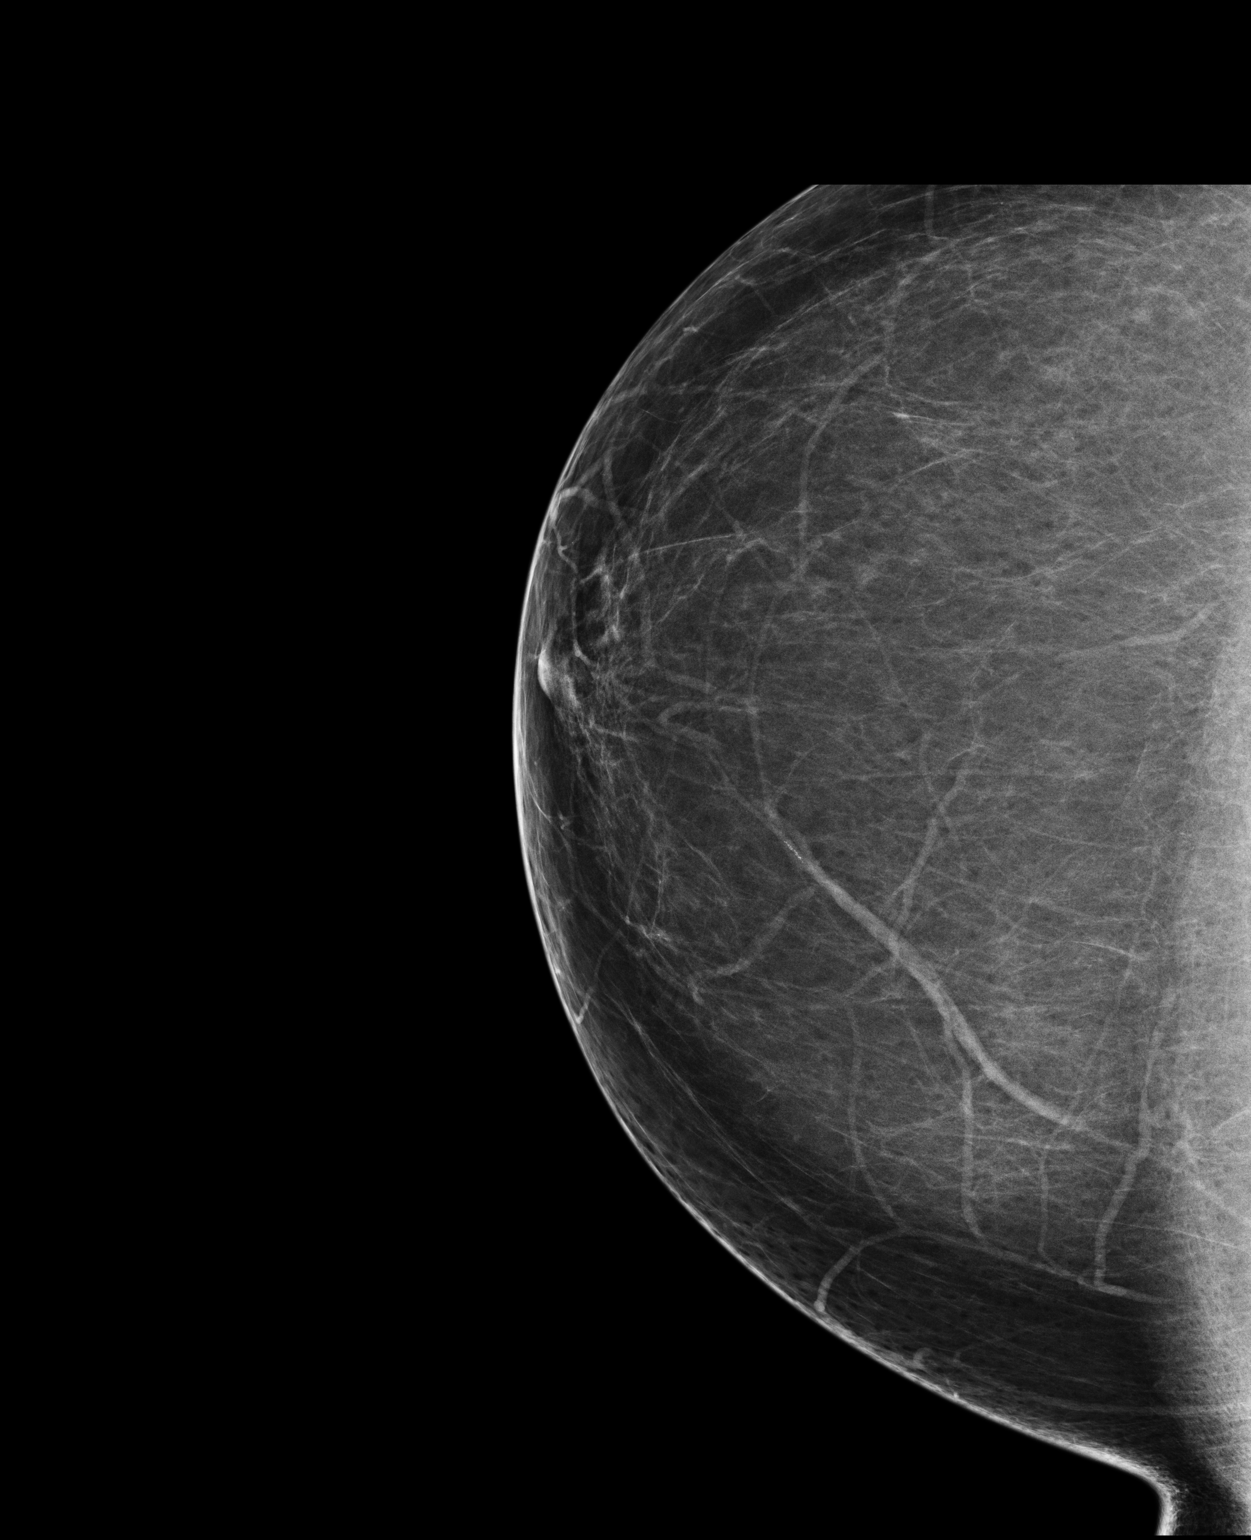

[R MLO]
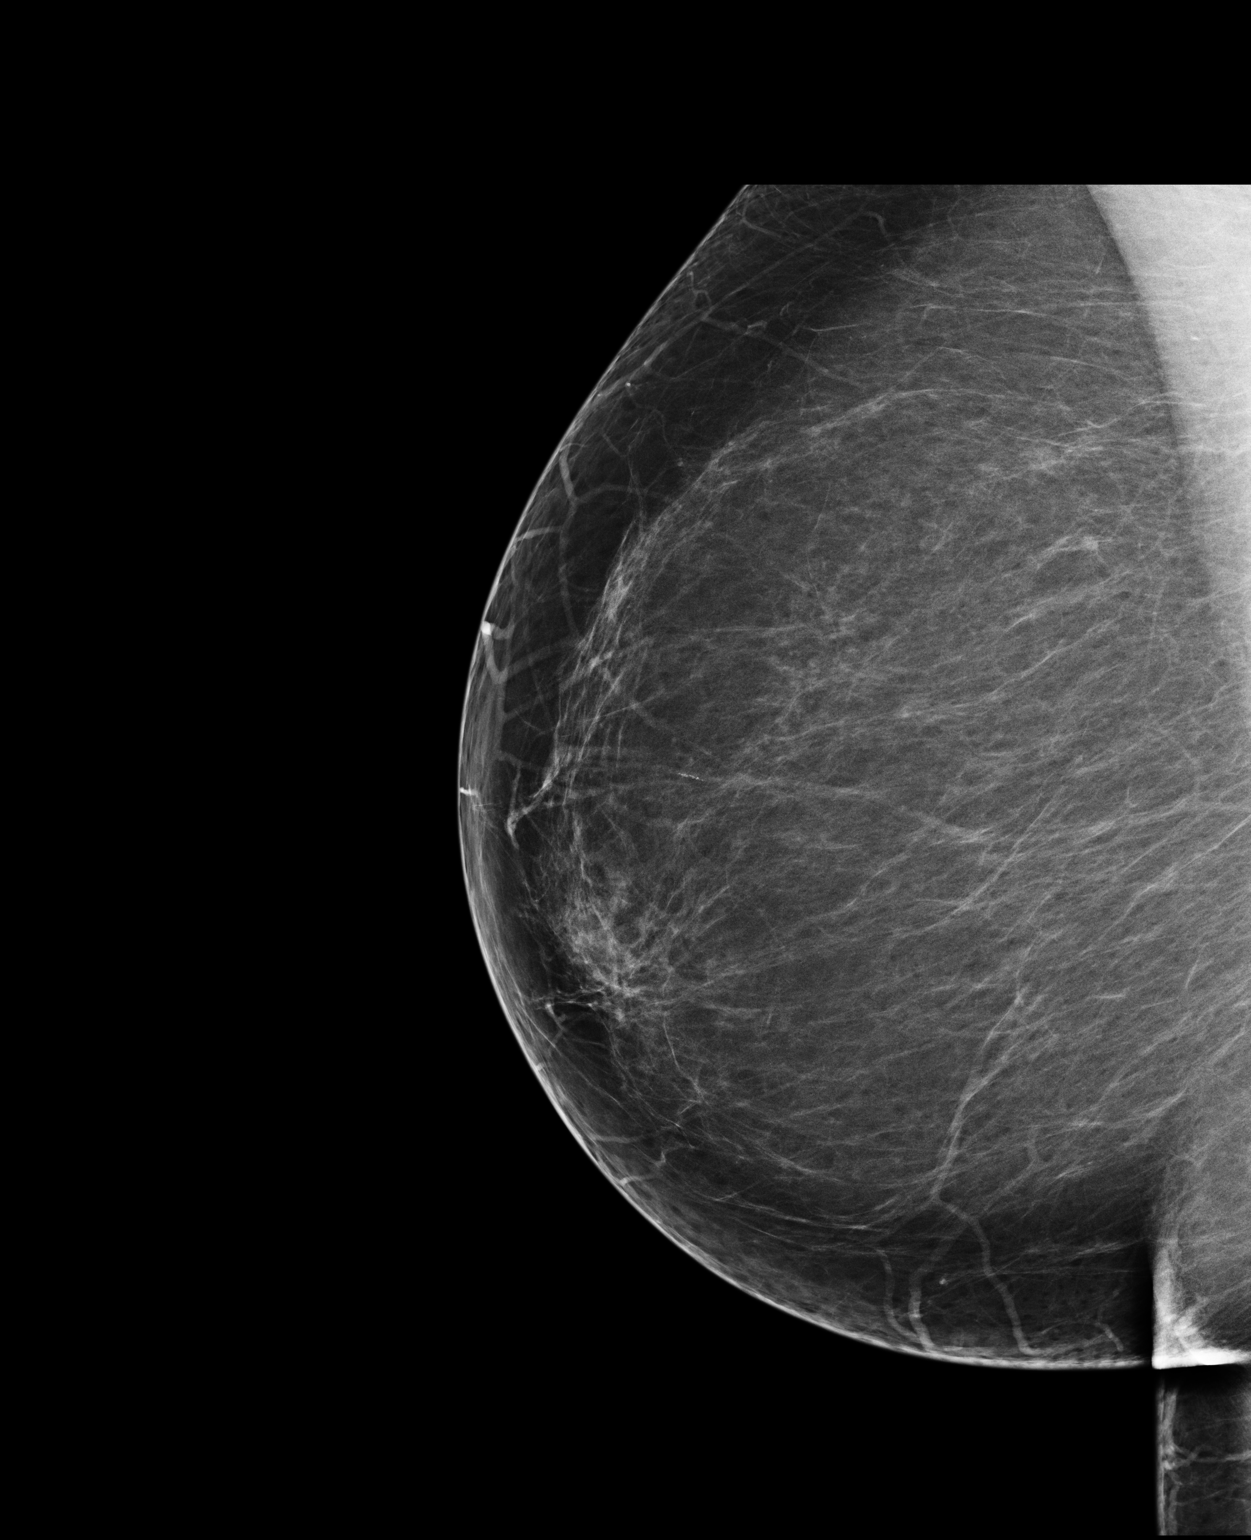

[3 of 3 positions shown; findings below may reference images not displayed]

ACR Breast Density Category b: There are scattered areas of
fibroglandular density.
FINDINGS: There are no findings suspicious for malignancy. Images were
processed with CAD.
IMPRESSION: No mammographic evidence of malignancy. A result letter of this
screening mammogram will be mailed directly to the patient.

RECOMMENDATION:
Screening mammogram in one year. (Code:GW-[id number])

BI-RADS CATEGORY  1: Negative

## 2015-03-26 ENCOUNTER — Other Ambulatory Visit: Payer: Self-pay | Admitting: Family Medicine

## 2015-03-28 ENCOUNTER — Other Ambulatory Visit: Payer: Self-pay | Admitting: Family Medicine

## 2015-03-31 ENCOUNTER — Other Ambulatory Visit: Payer: Self-pay | Admitting: Family Medicine

## 2015-03-31 LAB — COMPLETE METABOLIC PANEL WITH GFR
ALT: 14 U/L (ref 6–29)
AST: 16 U/L (ref 10–35)
Albumin: 3.6 g/dL (ref 3.6–5.1)
Alkaline Phosphatase: 116 U/L (ref 33–130)
BILIRUBIN TOTAL: 0.3 mg/dL (ref 0.2–1.2)
BUN: 15 mg/dL (ref 7–25)
CALCIUM: 8.6 mg/dL (ref 8.6–10.4)
CO2: 27 mmol/L (ref 20–31)
Chloride: 107 mmol/L (ref 98–110)
Creat: 0.84 mg/dL (ref 0.50–0.99)
GFR, EST AFRICAN AMERICAN: 85 mL/min (ref >=60)
GFR, Est Non African American: 74 mL/min (ref >=60)
Glucose, Bld: 83 mg/dL (ref 65–99)
Potassium: 4.4 mmol/L (ref 3.5–5.3)
Sodium: 139 mmol/L (ref 135–146)
Total Protein: 6.4 g/dL (ref 6.1–8.1)

## 2015-03-31 LAB — LIPID PANEL
CHOLESTEROL: 148 mg/dL (ref 125–200)
HDL: 44 mg/dL — ABNORMAL LOW (ref >=46)
LDL Cholesterol: 89 mg/dL (ref <130)
TRIGLYCERIDES: 74 mg/dL (ref <150)
Total CHOL/HDL Ratio: 3.4 Ratio (ref <=5.0)
VLDL: 15 mg/dL (ref <30)

## 2015-03-31 LAB — CBC WITH DIFFERENTIAL/PLATELET
BASOS PCT: 0 % (ref 0–1)
Basophils Absolute: 0 10*3/uL (ref 0.0–0.1)
Eosinophils Absolute: 0.3 10*3/uL (ref 0.0–0.7)
Eosinophils Relative: 4 % (ref 0–5)
HCT: 32.8 % — ABNORMAL LOW (ref 36.0–46.0)
Hemoglobin: 9.8 g/dL — ABNORMAL LOW (ref 12.0–15.0)
Lymphocytes Relative: 27 % (ref 12–46)
Lymphs Abs: 1.9 10*3/uL (ref 0.7–4.0)
MCH: 20.4 pg — ABNORMAL LOW (ref 26.0–34.0)
MCHC: 29.9 g/dL — ABNORMAL LOW (ref 30.0–36.0)
MCV: 68.2 fL — ABNORMAL LOW (ref 78.0–100.0)
MPV: 9.2 fL (ref 8.6–12.4)
Monocytes Absolute: 0.5 10*3/uL (ref 0.1–1.0)
Monocytes Relative: 7 % (ref 3–12)
NEUTROS PCT: 62 % (ref 43–77)
Neutro Abs: 4.3 10*3/uL (ref 1.7–7.7)
PLATELETS: 363 10*3/uL (ref 150–400)
RBC: 4.81 MIL/uL (ref 3.87–5.11)
RDW: 17.3 % — ABNORMAL HIGH (ref 11.5–15.5)
WBC: 6.9 10*3/uL (ref 4.0–10.5)

## 2015-03-31 LAB — IRON: Iron: 39 ug/dL — ABNORMAL LOW (ref 45–160)

## 2015-04-01 LAB — FERRITIN: Ferritin: 38 ng/mL (ref 10–291)

## 2015-04-01 LAB — HEMOGLOBIN A1C
Hgb A1c MFr Bld: 7.6 % — ABNORMAL HIGH (ref <5.7)
MEAN PLASMA GLUCOSE: 171 mg/dL — AB (ref <117)

## 2015-04-04 ENCOUNTER — Encounter: Payer: Self-pay | Admitting: Family Medicine

## 2015-04-04 ENCOUNTER — Ambulatory Visit (INDEPENDENT_AMBULATORY_CARE_PROVIDER_SITE_OTHER): Payer: BLUE CROSS/BLUE SHIELD | Admitting: Family Medicine

## 2015-04-04 VITALS — BP 130/80 | HR 96 | Resp 16 | Ht 66.0 in | Wt 249.0 lb

## 2015-04-04 DIAGNOSIS — Z1211 Encounter for screening for malignant neoplasm of colon: Secondary | ICD-10-CM | POA: Diagnosis not present

## 2015-04-04 DIAGNOSIS — E119 Type 2 diabetes mellitus without complications: Secondary | ICD-10-CM

## 2015-04-04 DIAGNOSIS — Z794 Long term (current) use of insulin: Secondary | ICD-10-CM

## 2015-04-04 DIAGNOSIS — Z0000 Encounter for general adult medical examination without abnormal findings: Secondary | ICD-10-CM

## 2015-04-04 DIAGNOSIS — R195 Other fecal abnormalities: Secondary | ICD-10-CM

## 2015-04-04 DIAGNOSIS — IMO0001 Reserved for inherently not codable concepts without codable children: Secondary | ICD-10-CM

## 2015-04-04 DIAGNOSIS — E559 Vitamin D deficiency, unspecified: Secondary | ICD-10-CM

## 2015-04-04 LAB — HEPATITIS C ANTIBODY: HCV AB: NEGATIVE

## 2015-04-04 LAB — POC HEMOCCULT BLD/STL (OFFICE/1-CARD/DIAGNOSTIC): Fecal Occult Blood, POC: POSITIVE — AB

## 2015-04-04 NOTE — Progress Notes (Signed)
Subjective:    Patient ID: Helen Mahoney, female    DOB: 1950-08-31, 64 y.o.   MRN: [id number]  HPI Patient is in for annual physical exam. No other health concerns are expressed or addressed at the visit. Recent labs, if available are reviewed. Immunization is reviewed , and  updated if needed.   Review of Systems See HPI     Objective:   Physical Exam BP 130/80 mmHg  Pulse 96  Resp 16  Ht 5\' 6"  (1.676 m)  Wt 249 lb (112.946 kg)  BMI 40.21 kg/m2  SpO2 97%  Pleasant well nourished female, alert and oriented x 3, in no cardio-pulmonary distress. Afebrile. HEENT No facial trauma or asymetry. Sinuses non tender.  Extra occullar muscles intact, pupils equally reactive to light. External ears normal, tympanic membranes clear. Oropharynx moist, no exudate,fairly  good dentition. Neck: supple, no adenopathy,JVD or thyromegaly.No bruits.  Chest: Clear to ascultation bilaterally.No crackles or wheezes. Non tender to palpation  Breast: No asymetry,no masses or lumps. No tenderness. No nipple discharge or inversion. No axillary or supraclavicular adenopathy  Cardiovascular system; Heart sounds normal,  S1 and  S2 ,no S3.  No murmur, or thrill. Apical beat not displaced Peripheral pulses normal.  Abdomen: Soft, non tender, no organomegaly or masses. No bruits. Bowel sounds normal. No guarding, tenderness or rebound.  Rectal:  Normal sphincter tone. Internal hemmorhoid .  Guaiac positive stool.  GU: External genitalia normal female genitalia , female distribution of hair. No lesions. Urethral meatus normal in size, no  Prolapse, no lesions visibly  Present. Bladder non tender. Vagina pink and moist , with no visible lesions , discharge present . Adequate pelvic support no  cystocele or rectocele noted Cervix pink and appears healthy, no lesions or ulcerations noted, no discharge noted from os Uterus normal size, no adnexal masses, no cervical motion or  adnexal tenderness.   Musculoskeletal exam: Full ROM of spine, hips , shoulders and knees. No deformity ,swelling or crepitus noted. No muscle wasting or atrophy.   Neurologic: Cranial nerves 2 to 12 intact. Power, tone ,sensation and reflexes normal throughout. No disturbance in gait. No tremor.  Skin: Intact, no ulceration, erythema , scaling or rash noted. Pigmentation normal throughout  Psych; Normal mood and affect. Judgement and concentration normal        Assessment & Plan:  Annual physical exam Annual exam as documented. Counseling done  re healthy lifestyle involving commitment to 150 minutes exercise per week, heart healthy diet, and attaining healthy weight.The importance of adequate sleep also discussed. Regular seat belt use and home safety, is also discussed. Changes in health habits are decided on by the patient with goals and time frames  set for achieving them. Immunization and cancer screening needs are specifically addressed at this visit.   Heme positive stool Heme positive stool, f/h of rectal ca, colonoscopy in 04/2011 hyperplastic polyp, will refer to GI for management as deemed necesary  Diabetes mellitus, insulin dependent (IDDM), controlled Deteriorated, closer attention to diet, needs to start exercise Ms. Tsutsui is reminded of the importance of commitment to daily physical activity for 30 minutes or more, as able and the need to limit carbohydrate intake to 30 to 60 grams per meal to help with blood sugar control.   The need to take medication as prescribed, test blood sugar as directed, and to call between visits if there is a concern that blood sugar is uncontrolled is also discussed.   Ms. Bentley is reminded  of the importance of daily foot exam, annual eye examination, and good blood sugar, blood pressure and cholesterol control.  Diabetic Labs Latest Ref Rng 03/31/2015 10/15/2014 07/08/2014 02/02/2014 10/02/2013  HbA1c <5.7 % 7.6(H) 7.4(H)  7.4(H) 7.1(H) 7.1(H)  Microalbumin <2.0 mg/dL - 1.1 - - 1.25  Micro/Creat Ratio 0.0 - 30.0 mg/g - 8.6 - - 11.0  Chol 125 - 200 mg/dL 148 157 - 162 174  HDL >=46 mg/dL 44(L) 58 - 56 44  Calc LDL <130 mg/dL 89 88 - 93 117(H)  Triglycerides <150 mg/dL 74 57 - 67 65  Creatinine 0.50 - 0.99 mg/dL 0.84 0.80 0.99 0.86 0.97   BP/Weight 04/04/2015 10/18/2014 07/13/2014 03/14/2014 10/06/2013 07/21/2013 [id number]  Systolic BP [id number]  Diastolic BP 80 78 78 74 82 78 82  Wt. (Lbs) 249 248 245 244.4 243 242.4 244.08  BMI 40.21 40.05 39.56 39.47 39.24 39.14 39.41   Foot/eye exam completion dates Latest Ref Rng 04/04/2015 11/09/2014  Eye Exam No Retinopathy - No Retinopathy  Foot exam Order - - -  Foot Form Completion - Done -

## 2015-04-04 NOTE — Patient Instructions (Addendum)
F/u in 3 month, call if you need me sooner  Please reduce sweets and carbs , blood sugar has increased slightly  Due to low iron and hidden blood in stool I am asking Dr Oneida Alar to re evalate you before I refer you to hematology for IV iron  It is important that you exercise regularly at least 30 minutes 5 times a week. If you develop chest pain, have severe difficulty breathing, or feel very tired, stop exercising immediately and seek medical attention    HBA1C, chem 7 and EGFR and vit D non fast in 3 months  Thanks for choosing [facility], we consider it a privelige to serve you. All the best for 2027!

## 2015-04-08 DIAGNOSIS — Z0000 Encounter for general adult medical examination without abnormal findings: Secondary | ICD-10-CM | POA: Insufficient documentation

## 2015-04-08 NOTE — Assessment & Plan Note (Signed)

## 2015-04-09 NOTE — Assessment & Plan Note (Signed)
Deteriorated, closer attention to diet, needs to start exercise Helen Mahoney is reminded of the importance of commitment to daily physical activity for 30 minutes or more, as able and the need to limit carbohydrate intake to 30 to 60 grams per meal to help with blood sugar control.   The need to take medication as prescribed, test blood sugar as directed, and to call between visits if there is a concern that blood sugar is uncontrolled is also discussed.   Helen Mahoney is reminded of the importance of daily foot exam, annual eye examination, and good blood sugar, blood pressure and cholesterol control.  Diabetic Labs Latest Ref Rng 03/31/2015 10/15/2014 07/08/2014 02/02/2014 10/02/2013  HbA1c <5.7 % 7.6(H) 7.4(H) 7.4(H) 7.1(H) 7.1(H)  Microalbumin <2.0 mg/dL - 1.1 - - 1.25  Micro/Creat Ratio 0.0 - 30.0 mg/g - 8.6 - - 11.0  Chol 125 - 200 mg/dL 148 157 - 162 174  HDL >=46 mg/dL 44(L) 58 - 56 44  Calc LDL <130 mg/dL 89 88 - 93 117(H)  Triglycerides <150 mg/dL 74 57 - 67 65  Creatinine 0.50 - 0.99 mg/dL 0.84 0.80 0.99 0.86 0.97   BP/Weight 04/04/2015 10/18/2014 07/13/2014 03/14/2014 10/06/2013 07/21/2013 [id number]  Systolic BP [id number]  Diastolic BP 80 78 78 74 82 78 82  Wt. (Lbs) 249 248 245 244.4 243 242.4 244.08  BMI 40.21 40.05 39.56 39.47 39.24 39.14 39.41   Foot/eye exam completion dates Latest Ref Rng 04/04/2015 11/09/2014  Eye Exam No Retinopathy - No Retinopathy  Foot exam Order - - -  Foot Form Completion - Done -

## 2015-04-09 NOTE — Assessment & Plan Note (Addendum)
Heme positive stool, f/h of rectal ca, colonoscopy in 04/2011 hyperplastic polyp, will refer to GI for management as deemed necesary

## 2015-04-13 ENCOUNTER — Encounter: Payer: Self-pay | Admitting: Gastroenterology

## 2015-04-18 ENCOUNTER — Telehealth: Payer: Self-pay | Admitting: Gastroenterology

## 2015-04-18 NOTE — Telephone Encounter (Signed)
-----   Message from Fayrene Helper, MD sent at 04/04/2015 12:11 PM EST ----- Regarding: help pls Pt is referred for consultation,  She has heme pos stool.and IDA which has not responded to oral iron I am aware that she has hemorrhoids. She does have f/h of intestinal cancer Your expertise is appreciated!

## 2015-04-18 NOTE — Telephone Encounter (Signed)
SCHEDULED URGENT OV WITH ANNA 04/25/15

## 2015-04-18 NOTE — Telephone Encounter (Signed)
NEEDS OPV FOR MICROCYTIC ANEMIA WITHIN 7 DAYS

## 2015-04-25 ENCOUNTER — Ambulatory Visit (INDEPENDENT_AMBULATORY_CARE_PROVIDER_SITE_OTHER): Payer: BLUE CROSS/BLUE SHIELD | Admitting: Gastroenterology

## 2015-04-25 ENCOUNTER — Encounter: Payer: Self-pay | Admitting: Gastroenterology

## 2015-04-25 ENCOUNTER — Other Ambulatory Visit: Payer: Self-pay

## 2015-04-25 VITALS — BP 128/78 | HR 91 | Temp 97.6°F | Ht 66.0 in | Wt 250.2 lb

## 2015-04-25 DIAGNOSIS — Z800 Family history of malignant neoplasm of digestive organs: Secondary | ICD-10-CM | POA: Insufficient documentation

## 2015-04-25 DIAGNOSIS — D509 Iron deficiency anemia, unspecified: Secondary | ICD-10-CM

## 2015-04-25 DIAGNOSIS — R195 Other fecal abnormalities: Secondary | ICD-10-CM | POA: Diagnosis not present

## 2015-04-25 MED ORDER — NA SULFATE-K SULFATE-MG SULF 17.5-3.13-1.6 GM/177ML PO SOLN: 1.0000 | Freq: Once | ORAL | Status: AC

## 2015-04-25 NOTE — Assessment & Plan Note (Signed)
64 year old with low normal ferritin, low iron, and drifting Hgb from baseline, now with heme positive stool. Has been taking daily iron without significant improvement in iron/ferritin/CBC. Notable family history of colon cancer in her sister, diagnosed in her 43s. Her last GI evaluation was in Jan 2013 with hyperplastic polyp, H.pylori gastritis on EGD. She is unsure if she took Pylera for eradication. With family history, heme positive stool, and persistent anemia, would favor early interval colonoscopy +/-EGD. Concern could remain that H.pylori not eradicated as well. Would also recommend capsule study if TCS/EGD unrevealing. No overt signs of GI bleeding noted.   Proceed with colonoscopy +/- EGD with Dr. Oneida Alar in the near future. The risks, benefits, and alternatives have been discussed in detail with the patient. They state understanding and desire to proceed.  Hold iron X 7 days prior 1/2 dose of Lantus the evening prior and no diabetes agents the morning of procedure

## 2015-04-25 NOTE — Progress Notes (Signed)
Primary Care Physician:  Tula Nakayama, MD Primary Gastroenterologist:  Dr. Oneida Alar   Chief Complaint  Patient presents with  . Anemia    HPI:   Helen Mahoney is a 64 y.o. female presenting today at the request of Dr. Moshe Cipro secondary to Winter. She was last seen in Jan 2013 due to anemia, with ferritin 40. She underwent colonoscopy with hyperplastic polyp, and EGD noted H.pylori gastritis. Negative celiac sprue. She is now being seen as she has had low normal ferritin, low iron, and Hgb drifting down from baseline to the 9 range. She has heme positive stool.   Denies hematochezia. States stool is dark on iron. No abdominal pain. No N/V. No concerning upper GI symptoms. No lack of appetite or weight loss. No constipation. Just rare diarrhea last week. No NSAIDs or aspirin powders. Doesn't remember if she ever took Pylera for H.pylori. Notes family history of colon cancer in her sister, diagnosed in her 47s.   Past Medical History  Diagnosis Date  . Diabetes mellitus, type 2 ([facility])   . Obesity   . Hyperlipidemia   . Hypertension   . Colon polyps     Past Surgical History  Procedure Laterality Date  . Cholecystectomy  2001    ACUTE CHOLECYSTITIS/GALLSTONES  . Bilateral tubal ligation  1979  . Upper gastrointestinal endoscopy  NOV 2008 [facility] ANEMIA    NL EXAM, urease neg  . Colonoscopy w/ polypectomy  NOV 2008 [facility] ANEMIA    POLYP NO RETRIEVED  . Colonoscopy w/ polypectomy  2006 DR. SMTH    POLYP?  [facility] Colonoscopy  05/14/2011    Dr. Oneida Alar: sessile polyp in sigmoid colon, internal hemorrhoids, hyerplastic polyps  . Esophagogastroduodenoscopy  05/14/2011    Dr. Oneida Alar: sessile polyps in the cardia, mild gastritis. Chronic duodenitis consistent with peptic duodenitis, chronic active H.pylori gastritis.     Current Outpatient Prescriptions  Medication Sig Dispense Refill  . aspirin 81 MG tablet Take 81 mg by mouth daily.    . Calcium Citrate-Vitamin D (CITRACAL PETITES/VITAMIN  D) 200-250 MG-UNIT TABS Take by mouth daily.      . ergocalciferol (VITAMIN D2) 50000 UNITS capsule Take 1 capsule (50,000 Units total) by mouth once a week. One capsule once weekly 4 capsule 11  . Ferrous Sulfate (IRON) 325 (65 FE) MG TABS Take 1 tablet by mouth daily. 100 each 2  . FREESTYLE LITE test strip USE ONE STRIP TO CHECK GLUCOSE ONCE DAILY 50 each prn  . glipiZIDE (GLUCOTROL) 10 MG tablet Take 1 tablet (10 mg total) by mouth 2 (two) times daily before a meal. 180 tablet 1  . LANTUS SOLOSTAR 100 UNIT/ML Solostar Pen INJECT 25 UNITS SUBCUTANEOUSLY ONCE DAILY 10:00  PM 15 mL 2  . lisinopril (PRINIVIL,ZESTRIL) 20 MG tablet TAKE ONE TABLET BY MOUTH ONCE DAILY 90 tablet 1  . metFORMIN (GLUCOPHAGE) 1000 MG tablet Take 1 tablet (1,000 mg total) by mouth 2 (two) times daily with a meal. 180 tablet 1  . Multiple Vitamin (MULTIVITAMINS PO) Take by mouth daily.      . pravastatin (PRAVACHOL) 80 MG tablet TAKE ONE TABLET BY MOUTH ONCE DAILY IN THE EVENING 90 tablet 1  . RELION SHORT PEN NEEDLES 31G X 8 MM MISC USE ONE  ONCE DAILY 100 each 2   No current facility-administered medications for this visit.    Allergies as of 04/25/2015 - Review Complete 04/25/2015  Allergen Reaction Noted  . Penicillins  08/20/2007  . Tetanus toxoids  07/17/2010    Family History  Problem Relation Age of Onset  . Cancer Mother     breast   . Heart disease Mother   . Diabetes Mother   . Hypertension Mother   . Stroke Mother   . Diabetes Father   . Heart attack Father   . Kidney disease Father   . Hypertension Sister   . Diabetes Sister   . Diabetes Sister   . Mental illness Sister   . Hypertension Sister   . Mental illness Sister   . Diabetes Sister   . Hypertension Sister   . Mental illness Sister   . Colon cancer Sister     may have been in her 57s when diagnosed  . Diabetes Brother     Social History   Social History  . Marital Status: Married    Spouse Name: N/A  . Number of Children:  N/A  . Years of Education: N/A   Occupational History  . Not on file.   Social History Main Topics  . Smoking status: Never Smoker   . Smokeless tobacco: Not on file  . Alcohol Use: No  . Drug Use: No  . Sexual Activity: Not on file   Other Topics Concern  . Not on file   Social History Narrative    Review of Systems: Gen: Denies any fever, chills, fatigue, weight loss, lack of appetite.  CV: Denies chest pain, heart palpitations, peripheral edema, syncope.  Resp: Denies shortness of breath at rest or with exertion. Denies wheezing or cough.  GI: see HPI  GU : Denies urinary burning, urinary frequency, urinary hesitancy MS: +knee pain  Derm: Denies rash, itching, dry skin Psych: Denies depression, anxiety, memory loss, and confusion Heme: Denies bruising, bleeding, and enlarged lymph nodes.  Physical Exam: BP 128/78 mmHg  Pulse 91  Temp(Src) 97.6 F (36.4 C) (Oral)  Ht 5\' 6"  (1.676 m)  Wt 250 lb 3.2 oz (113.49 kg)  BMI 40.40 kg/m2 General:   Alert and oriented. Pleasant and cooperative. Well-nourished and well-developed.  Head:  Normocephalic and atraumatic. Eyes:  Without icterus, sclera clear and conjunctiva pink.  Ears:  Normal auditory acuity. Nose:  No deformity, discharge,  or lesions. Mouth:  No deformity or lesions, oral mucosa pink.  Lungs:  Clear to auscultation bilaterally. No wheezes, rales, or rhonchi. No distress.  Heart:  S1, S2 present without murmurs appreciated.  Abdomen:  +BS, soft, non-tender and non-distended. No HSM noted. No guarding or rebound. No masses appreciated.  Rectal:  Deferred  Msk:  Symmetrical without gross deformities. Normal posture. Extremities:  Without edema. Neurologic:  Alert and  oriented x4;  grossly normal neurologically. Skin:  Intact without significant lesions or rashes. Psych:  Alert and cooperative. Normal mood and affect.   Lab Results  Component Value Date   WBC 6.9 03/31/2015   HGB 9.8* 03/31/2015   HCT  32.8* 03/31/2015   MCV 68.2* 03/31/2015   PLT 363 03/31/2015   Lab Results  Component Value Date   ALT 14 03/31/2015   AST 16 03/31/2015   ALKPHOS 116 03/31/2015   BILITOT 0.3 03/31/2015   Lab Results  Component Value Date   CREATININE 0.84 03/31/2015   BUN 15 03/31/2015   NA 139 03/31/2015   K 4.4 03/31/2015   CL 107 03/31/2015   CO2 27 03/31/2015   Lab Results  Component Value Date   IRON 39* 03/31/2015   FERRITIN 38 03/31/2015

## 2015-04-25 NOTE — Patient Instructions (Signed)
We have scheduled you for a colonoscopy and possible upper endoscopy with Dr. Oneida Alar. You may need a capsule study, which takes pictures of your small intestine. We will decide this after the procedures are complete.  Stop taking iron 7 days before the procedure.   Only take 1/2 dose of Lantus the evening before the procedure. Do not take any diabetes medication the day of the procedure.  Happy New Year!

## 2015-04-25 NOTE — Progress Notes (Signed)
cc'ed to pcp °

## 2015-05-02 NOTE — Progress Notes (Signed)
REVIEWED-NO ADDITIONAL RECOMMENDATIONS. 

## 2015-05-07 ENCOUNTER — Other Ambulatory Visit: Payer: Self-pay | Admitting: Family Medicine

## 2015-05-10 ENCOUNTER — Other Ambulatory Visit: Payer: Self-pay | Admitting: Family Medicine

## 2015-05-15 ENCOUNTER — Encounter (HOSPITAL_COMMUNITY): Payer: Self-pay

## 2015-05-15 ENCOUNTER — Ambulatory Visit (HOSPITAL_COMMUNITY)
Admission: RE | Admit: 2015-05-15 | Discharge: 2015-05-15 | Disposition: A | Discharge status: Home / Self Care | Payer: BLUE CROSS/BLUE SHIELD | Source: Ambulatory Visit | Attending: Gastroenterology | Admitting: Gastroenterology

## 2015-05-15 ENCOUNTER — Encounter (HOSPITAL_COMMUNITY)
Admission: RE | Disposition: A | Discharge status: Home / Self Care | Payer: Self-pay | Source: Ambulatory Visit | Attending: Gastroenterology

## 2015-05-15 DIAGNOSIS — Z7982 Long term (current) use of aspirin: Secondary | ICD-10-CM | POA: Insufficient documentation

## 2015-05-15 DIAGNOSIS — K222 Esophageal obstruction: Secondary | ICD-10-CM

## 2015-05-15 DIAGNOSIS — D509 Iron deficiency anemia, unspecified: Secondary | ICD-10-CM | POA: Diagnosis not present

## 2015-05-15 DIAGNOSIS — Z9049 Acquired absence of other specified parts of digestive tract: Secondary | ICD-10-CM | POA: Insufficient documentation

## 2015-05-15 DIAGNOSIS — E119 Type 2 diabetes mellitus without complications: Secondary | ICD-10-CM | POA: Insufficient documentation

## 2015-05-15 DIAGNOSIS — E785 Hyperlipidemia, unspecified: Secondary | ICD-10-CM | POA: Insufficient documentation

## 2015-05-15 DIAGNOSIS — K648 Other hemorrhoids: Secondary | ICD-10-CM | POA: Insufficient documentation

## 2015-05-15 DIAGNOSIS — I10 Essential (primary) hypertension: Secondary | ICD-10-CM | POA: Insufficient documentation

## 2015-05-15 DIAGNOSIS — K317 Polyp of stomach and duodenum: Secondary | ICD-10-CM | POA: Insufficient documentation

## 2015-05-15 DIAGNOSIS — Z9851 Tubal ligation status: Secondary | ICD-10-CM | POA: Insufficient documentation

## 2015-05-15 DIAGNOSIS — K3189 Other diseases of stomach and duodenum: Secondary | ICD-10-CM | POA: Insufficient documentation

## 2015-05-15 DIAGNOSIS — E669 Obesity, unspecified: Secondary | ICD-10-CM | POA: Insufficient documentation

## 2015-05-15 DIAGNOSIS — R195 Other fecal abnormalities: Secondary | ICD-10-CM | POA: Diagnosis not present

## 2015-05-15 DIAGNOSIS — Z79899 Other long term (current) drug therapy: Secondary | ICD-10-CM | POA: Insufficient documentation

## 2015-05-15 DIAGNOSIS — K5730 Diverticulosis of large intestine without perforation or abscess without bleeding: Secondary | ICD-10-CM | POA: Insufficient documentation

## 2015-05-15 DIAGNOSIS — K2960 Other gastritis without bleeding: Secondary | ICD-10-CM | POA: Insufficient documentation

## 2015-05-15 DIAGNOSIS — K449 Diaphragmatic hernia without obstruction or gangrene: Secondary | ICD-10-CM | POA: Insufficient documentation

## 2015-05-15 DIAGNOSIS — Z887 Allergy status to serum and vaccine status: Secondary | ICD-10-CM | POA: Insufficient documentation

## 2015-05-15 DIAGNOSIS — Z880 Allergy status to penicillin: Secondary | ICD-10-CM | POA: Insufficient documentation

## 2015-05-15 DIAGNOSIS — Z800 Family history of malignant neoplasm of digestive organs: Secondary | ICD-10-CM

## 2015-05-15 HISTORY — PX: COLONOSCOPY: SHX5424

## 2015-05-15 HISTORY — PX: ESOPHAGOGASTRODUODENOSCOPY: SHX5428

## 2015-05-15 LAB — GLUCOSE, CAPILLARY: GLUCOSE-CAPILLARY: 229 mg/dL — AB (ref 65–99)

## 2015-05-15 SURGERY — COLONOSCOPY
Anesthesia: Moderate Sedation

## 2015-05-15 MED ORDER — MEPERIDINE HCL 100 MG/ML IJ SOLN
INTRAMUSCULAR | Status: AC
  Filled 2015-05-15: qty 2

## 2015-05-15 MED ORDER — STERILE WATER FOR IRRIGATION IR SOLN
Status: DC | PRN
  Administered 2015-05-15: 09:00:00

## 2015-05-15 MED ORDER — MIDAZOLAM HCL 5 MG/5ML IJ SOLN
INTRAMUSCULAR | Status: AC
  Filled 2015-05-15: qty 10

## 2015-05-15 MED ORDER — SODIUM CHLORIDE 0.9 % IV SOLN
INTRAVENOUS | Status: DC
  Administered 2015-05-15: 08:00:00 via INTRAVENOUS

## 2015-05-15 MED ORDER — LIDOCAINE VISCOUS 2 % MT SOLN
OROMUCOSAL | Status: DC | PRN
  Administered 2015-05-15: 4 mL via OROMUCOSAL

## 2015-05-15 MED ORDER — MEPERIDINE HCL 100 MG/ML IJ SOLN
INTRAMUSCULAR | Status: DC | PRN
  Administered 2015-05-15 (×3): 25 mg via INTRAVENOUS

## 2015-05-15 MED ORDER — MIDAZOLAM HCL 5 MG/5ML IJ SOLN
INTRAMUSCULAR | Status: DC | PRN
  Administered 2015-05-15: 2 mg via INTRAVENOUS
  Administered 2015-05-15 (×2): 1 mg via INTRAVENOUS
  Administered 2015-05-15: 2 mg via INTRAVENOUS

## 2015-05-15 MED ORDER — LIDOCAINE VISCOUS 2 % MT SOLN
OROMUCOSAL | Status: AC
  Filled 2015-05-15: qty 15

## 2015-05-15 NOTE — Op Note (Signed)
[facility] [address] [place], [address]   COLONOSCOPY PROCEDURE REPORT  PATIENT: Helen Mahoney, Helen Mahoney  MR#: [id number] BIRTHDATE: Aug 28, 1950 , 16  yrs. old GENDER: female ENDOSCOPIST: Danie Binder, MD REFERRED IO:[id number] Moshe Cipro, M.D. PROCEDURE DATE:  2015-06-09 PROCEDURE:   Colonoscopy, diagnostic INDICATIONS:iron deficiency anemia. MEDICATIONS: Demerol 50 mg IV and Versed 4 mg IV MD INITATED SEDATION: [id number] ENDOSCOPY COMPLETE: [id number]  DESCRIPTION OF PROCEDURE:    Physical exam was performed.  Informed consent was obtained from the patient after explaining the benefits, risks, and alternatives to procedure.  The patient was connected to monitor and placed in left lateral position. Continuous oxygen was provided by nasal cannula and IV medicine administered through an indwelling cannula.  After administration of sedation and rectal exam, the patients rectum was intubated and the [id number]  colonoscope was advanced under direct visualization to the ileum.  The scope was removed slowly by carefully examining the color, texture, anatomy, and integrity mucosa on the way out.  The patient was recovered in endoscopy and discharged home in satisfactory condition. Estimated blood loss is zero unless otherwise noted in this procedure report.    COLON FINDINGS: The examined terminal ileum appeared to be normal. , The colon was redundant.  Manual abdominal counter-pressure was used to reach the cecum, There was mild diverticulosis noted in the proximal transverse colon and ascending colon with associated muscular hypertrophy.  , and Small internal hemorrhoids were found.   PREP QUALITY: excellent.  CECAL W/D TIME: 11       minutes COMPLICATIONS: None  ENDOSCOPIC IMPRESSION: 1.   NO SOURCE FOR FEDA IDENTIFIED 2.   The LEFT colon was redundant 3.   Mild diverticulosis in the proximal transverse colon and ascending colon 4.   Small internal  hemorrhoids  RECOMMENDATIONS: NO MRI FOR 30 DAYS DUE TO METAL CLIPS PLACED IN THE stomach. DRINK WATER. FOLLOW A HIGH FIBER/LOW FAT DIET. AWAIT BIOPSY RESULTS.  IF NO H PYLORI, WILL HOLD ASA FOR 3 MS.  STOP IRON.  REPEAT CBC/FERRITIN.  CONSIDER GIVENS. FOLLOW UP IN 3 MOS WITH DR.  Matricia Begnaud. Next colonoscopy in 5-10 years.   eSigned:  Danie Binder, MD 09-Jun-2015 3:53 PM   CPT CODES: ICD CODES:  The ICD and CPT codes recommended by this software are interpretations from the data that the clinical staff has captured with the software.  The verification of the translation of this report to the ICD and CPT codes and modifiers is the sole responsibility of the health care institution and practicing physician where this report was generated.  [employer]. will not be held responsible for the validity of the ICD and CPT codes included on this report.  AMA assumes no liability for data contained or not contained herein. CPT is a Designer, television/film set of the [employer].

## 2015-05-15 NOTE — Discharge Instructions (Signed)
YOU DID NOT HAVE ANY POLYPS. You have small internal hemorrhoids & diverticulosis in your right colon. You had TWO STOMACH POLYPS REMOVED. ONE WAS BLEEDING. I PUT 2 CLIPS AT THE BASE OF IT AFTER I REMOVED IT. gastritis & DUODENITIS. I biopsied your stomach AND SMAL.L BOWEL.   NO MRI FOR 30 DAYS DUE TO METAL CLIPS PLACED IN THE stomach.  DRINK WATER TO KEEP YOUR URINE LIGHT YELLOW.  FOLLOW A HIGH FIBER/LOW FAT DIET. AVOID ITEMS THAT CAUSE BLOATING. SEE INFO BELOW.  YOUR BIOPSY RESULTS WILL BE AVAILABLE IN MY CHART JAN 18 AND MY OFFICE WILL CONTACT YOU IN 10-14 DAYS WITH YOUR RESULTS.   FOLLOW UP IN 3 MOS WITH DR. Marzell Isakson.  Next colonoscopy in 5-10 years.    ENDOSCOPY Care After Read the instructions outlined below and refer to this sheet in the next week. These discharge instructions provide you with general information on caring for yourself after you leave the hospital. While your treatment has been planned according to the most current medical practices available, unavoidable complications occasionally occur. If you have any problems or questions after discharge, call DR. Glorious Flicker, [phone].  ACTIVITY  You may resume your regular activity, but move at a slower pace for the next 24 hours.   Take frequent rest periods for the next 24 hours.   Walking will help get rid of the air and reduce the bloated feeling in your belly (abdomen).   No driving for 24 hours (because of the medicine (anesthesia) used during the test).   You may shower.   Do not sign any important legal documents or operate any machinery for 24 hours (because of the anesthesia used during the test).    NUTRITION  Drink plenty of fluids.   You may resume your normal diet as instructed by your doctor.   Begin with a light meal and progress to your normal diet. Heavy or fried foods are harder to digest and may make you feel sick to your stomach (nauseated).   Avoid alcoholic beverages for 24 hours or as  instructed.    MEDICATIONS  You may resume your normal medications.   WHAT YOU CAN EXPECT TODAY  Some feelings of bloating in the abdomen.   Passage of more gas than usual.   Spotting of blood in your stool or on the toilet paper  .  IF YOU HAD POLYPS REMOVED DURING THE ENDOSCOPY:  Eat a soft diet IF YOU HAVE NAUSEA, BLOATING, ABDOMINAL PAIN, OR VOMITING.    FINDING OUT THE RESULTS OF YOUR TEST Not all test results are available during your visit. DR. Oneida Alar WILL CALL YOU WITHIN 14 DAYS OF YOUR PROCEDUE WITH YOUR RESULTS. Do not assume everything is normal if you have not heard from DR. Breyanna Valera, CALL HER OFFICE AT [phone].  SEEK IMMEDIATE MEDICAL ATTENTION AND CALL THE OFFICE: [phone] IF:  You have more than a spotting of blood in your stool.   Your belly is swollen (abdominal distention).   You are nauseated or vomiting.   You have a temperature over 101F.   You have abdominal pain or discomfort that is severe or gets worse throughout the day.   Gastritis  Gastritis is an inflammation (the body's way of reacting to injury and/or infection) of the stomach.  It is often caused by bacterial (germ) infections. It can also be caused BY ASPIRIN, BC/GOODY POWDER'S, (IBUPROFEN) MOTRIN, OR ALEVE (NAPROXEN), chemicals (including alcohol), SPICY FOODS, and medications. This illness may be associated with generalized  malaise (feeling tired, not well), UPPER ABDOMINAL STOMACH cramps, and fever. One common bacterial cause of gastritis is an organism known as H. Pylori. This can be treated with antibiotics.   High-Fiber Diet A high-fiber diet changes your normal diet to include more whole grains, legumes, fruits, and vegetables. Changes in the diet involve replacing refined carbohydrates with unrefined foods. The calorie level of the diet is essentially unchanged. The Dietary Reference Intake (recommended amount) for adult males is 38 grams per day. For adult females, it is 25  grams per day. Pregnant and lactating women should consume 28 grams of fiber per day. Fiber is the intact part of a plant that is not broken down during digestion. Functional fiber is fiber that has been isolated from the plant to provide a beneficial effect in the body. PURPOSE  Increase stool bulk.   Ease and regulate bowel movements.   Lower cholesterol.  REDUCE RISK OF COLON CANCER  INDICATIONS THAT YOU NEED MORE FIBER  Constipation and hemorrhoids.   Uncomplicated diverticulosis (intestine condition) and irritable bowel syndrome.   Weight management.   As a protective measure against hardening of the arteries (atherosclerosis), diabetes, and cancer.   GUIDELINES FOR INCREASING FIBER IN THE DIET  Start adding fiber to the diet slowly. A gradual increase of about 5 more grams (2 slices of whole-wheat bread, 2 servings of most fruits or vegetables, or 1 bowl of high-fiber cereal) per day is best. Too rapid an increase in fiber may result in constipation, flatulence, and bloating.   Drink enough water and fluids to keep your urine clear or pale yellow. Water, juice, or caffeine-free drinks are recommended. Not drinking enough fluid may cause constipation.   Eat a variety of high-fiber foods rather than one type of fiber.   Try to increase your intake of fiber through using high-fiber foods rather than fiber pills or supplements that contain small amounts of fiber.   The goal is to change the types of food eaten. Do not supplement your present diet with high-fiber foods, but replace foods in your present diet.   INCLUDE A VARIETY OF FIBER SOURCES  Replace refined and processed grains with whole grains, canned fruits with fresh fruits, and incorporate other fiber sources. White rice, white breads, and most bakery goods contain little or no fiber.   Brown whole-grain rice, buckwheat oats, and many fruits and vegetables are all good sources of fiber. These include: broccoli,  Brussels sprouts, cabbage, cauliflower, beets, sweet potatoes, white potatoes (skin on), carrots, tomatoes, eggplant, squash, berries, fresh fruits, and dried fruits.   Cereals appear to be the richest source of fiber. Cereal fiber is found in whole grains and bran. Bran is the fiber-rich outer coat of cereal grain, which is largely removed in refining. In whole-grain cereals, the bran remains. In breakfast cereals, the largest amount of fiber is found in those with "bran" in their names. The fiber content is sometimes indicated on the label.   You may need to include additional fruits and vegetables each day.   In baking, for 1 cup white flour, you may use the following substitutions:   1 cup whole-wheat flour minus 2 tablespoons.   1/2 cup white flour plus 1/2 cup whole-wheat flour.   Low-Fat Diet BREADS, CEREALS, PASTA, RICE, DRIED PEAS, AND BEANS These products are high in carbohydrates and most are low in fat. Therefore, they can be increased in the diet as substitutes for fatty foods. They too, however, contain calories and should  not be eaten in excess. Cereals can be eaten for snacks as well as for breakfast.  Include foods that contain fiber (fruits, vegetables, whole grains, and legumes). Research shows that fiber may lower blood cholesterol levels, especially the water-soluble fiber found in fruits, vegetables, oat products, and legumes. FRUITS AND VEGETABLES It is good to eat fruits and vegetables. Besides being sources of fiber, both are rich in vitamins and some minerals. They help you get the daily allowances of these nutrients. Fruits and vegetables can be used for snacks and desserts. MEATS Limit lean meat, chicken, [place], and fish to no more than 6 ounces per day. Beef, Pork, and Lamb Use lean cuts of beef, pork, and lamb. Lean cuts include:  Extra-lean ground beef.  Arm roast.  Sirloin tip.  Center-cut ham.  Round steak.  Loin chops.  Rump roast.  Tenderloin.  Trim  all fat off the outside of meats before cooking. It is not necessary to severely decrease the intake of red meat, but lean choices should be made. Lean meat is rich in protein and contains a highly absorbable form of iron. Premenopausal women, in particular, should avoid reducing lean red meat because this could increase the risk for low red blood cells (iron-deficiency anemia). The organ meats, such as liver, sweetbreads, kidneys, and brain are very rich in cholesterol. They should be limited. Chicken and [place] These are good sources of protein. The fat of poultry can be reduced by removing the skin and underlying fat layers before cooking. Chicken and [place] can be substituted for lean red meat in the diet. Poultry should not be fried or covered with high-fat sauces. Fish and Shellfish Fish is a good source of protein. Shellfish contain cholesterol, but they usually are low in saturated fatty acids. The preparation of fish is important. Like chicken and [place], they should not be fried or covered with high-fat sauces. EGGS Egg whites contain no fat or cholesterol. They can be eaten often. Try 1 to 2 egg whites instead of whole eggs in recipes or use egg substitutes that do not contain yolk. MILK AND DAIRY PRODUCTS Use skim or 1% milk instead of 2% or whole milk. Decrease whole milk, natural, and processed cheeses. Use nonfat or low-fat (2%) cottage cheese or low-fat cheeses made from vegetable oils. Choose nonfat or low-fat (1 to 2%) yogurt. Experiment with evaporated skim milk in recipes that call for heavy cream. Substitute low-fat yogurt or low-fat cottage cheese for sour cream in dips and salad dressings. Have at least 2 servings of low-fat dairy products, such as 2 glasses of skim (or 1%) milk each day to help get your daily calcium intake.  FATS AND OILS Reduce the total intake of fats, especially saturated fat. Butterfat, lard, and beef fats are high in saturated fat and cholesterol. These  should be avoided as much as possible. Vegetable fats do not contain cholesterol, but certain vegetable fats, such as coconut oil, palm oil, and palm kernel oil are very high in saturated fats. These should be limited. These fats are often used in bakery goods, processed foods, popcorn, oils, and nondairy creamers. Vegetable shortenings and some peanut butters contain hydrogenated oils, which are also saturated fats. Read the labels on these foods and check for saturated vegetable oils. Unsaturated vegetable oils and fats do not raise blood cholesterol. However, they should be limited because they are fats and are high in calories. Total fat should still be limited to 30% of your daily caloric intake. Desirable  liquid vegetable oils are corn oil, cottonseed oil, olive oil, canola oil, safflower oil, soybean oil, and sunflower oil. Peanut oil is not as good, but small amounts are acceptable. Buy a heart-healthy tub margarine that has no partially hydrogenated oils in the ingredients. Mayonnaise and salad dressings often are made from unsaturated fats, but they should also be limited because of their high calorie and fat content. Seeds, nuts, peanut butter, olives, and avocados are high in fat, but the fat is mainly the unsaturated type. These foods should be limited mainly to avoid excess calories and fat. OTHER EATING TIPS Snacks  Most sweets should be limited as snacks. They tend to be rich in calories and fats, and their caloric content outweighs their nutritional value. Some good choices in snacks are graham crackers, melba toast, soda crackers, bagels (no egg), English muffins, fruits, and vegetables. These snacks are preferable to snack crackers, [place] fries, and chips. Popcorn should be air-popped or cooked in small amounts of liquid vegetable oil. Desserts Eat fruit, low-fat yogurt, and fruit ices. AVOID pastries, cake, and cookies. Sherbet, angel food cake, gelatin dessert, frozen low-fat yogurt, or  other frozen products that do not contain saturated fat (pure fruit juice bars, frozen ice pops) are also acceptable.  COOKING METHODS Choose those methods that use little or no fat. They include: Poaching.  Braising.  Steaming.  Grilling.  Baking.  Stir-frying.  Broiling.  Microwaving.  Foods can be cooked in a nonstick pan without added fat, or use a nonfat cooking spray in regular cookware. Limit fried foods and avoid frying in saturated fat. Add moisture to lean meats by using water, broth, cooking wines, and other nonfat or low-fat sauces along with the cooking methods mentioned above. Soups and stews should be chilled after cooking. The fat that forms on top after a few hours in the refrigerator should be skimmed off. When preparing meals, avoid using excess salt. Salt can contribute to raising blood pressure in some people. EATING AWAY FROM HOME Order entres, potatoes, and vegetables without sauces or butter. When meat exceeds the size of a deck of cards (3 to 4 ounces), the rest can be taken home for another meal. Choose vegetable or fruit salads and ask for low-calorie salad dressings to be served on the side. Use dressings sparingly. Limit high-fat toppings, such as bacon, crumbled eggs, cheese, sunflower seeds, and olives. Ask for heart-healthy tub margarine instead of butter.   Diverticulosis Diverticulosis is a common condition that develops when small pouches (diverticula) form in the wall of the colon. The risk of diverticulosis increases with age. It happens more often in people who eat a low-fiber diet. Most individuals with diverticulosis have no symptoms. Those individuals with symptoms usually experience belly (abdominal) pain, constipation, or loose stools (diarrhea).  HOME CARE INSTRUCTIONS  Increase the amount of fiber in your diet as directed by your caregiver or dietician. This may reduce symptoms of diverticulosis.   Drink at least 6 to 8 glasses of water each day  to prevent constipation.   Try not to strain when you have a bowel movement.   Avoiding nuts and seeds to prevent complications is NOT NECESSARY.   FOODS HAVING HIGH FIBER CONTENT INCLUDE:  Fruits. Apple, peach, pear, tangerine, raisins, prunes.   Vegetables. Brussels sprouts, asparagus, broccoli, cabbage, carrot, cauliflower, romaine lettuce, spinach, summer squash, tomato, winter squash, zucchini.   Starchy Vegetables. Baked beans, kidney beans, lima beans, split peas, lentils, potatoes (with skin).   Grains. Whole wheat  bread, brown rice, bran flake cereal, plain oatmeal, white rice, shredded wheat, bran muffins.    SEEK IMMEDIATE MEDICAL CARE IF:  You develop increasing pain or severe bloating.   You have an oral temperature above 101F.   You develop vomiting or bowel movements that are bloody or black.    Hemorrhoids Hemorrhoids are dilated (enlarged) veins around the rectum. Sometimes clots will form in the veins. This makes them swollen and painful. These are called thrombosed hemorrhoids. Causes of hemorrhoids include:  Constipation.   Straining to have a bowel movement.   HEAVY LIFTING  HOME CARE INSTRUCTIONS  Eat a well balanced diet and drink 6 to 8 glasses of water every day to avoid constipation. You may also use a bulk laxative.   Avoid straining to have bowel movements.   Keep anal area dry and clean.   Do not use a donut shaped pillow or sit on the toilet for long periods. This increases blood pooling and pain.   Move your bowels when your body has the urge; this will require less straining and will decrease pain and pressure.

## 2015-05-15 NOTE — H&P (Signed)
Primary Care Physician:  Tula Nakayama, MD Primary Gastroenterologist:  Dr. Oneida Alar  Pre-Procedure History & Physical: HPI:  Helen Mahoney is a 65 y.o. female here for Anemia/heme pos stools.  Past Medical History  Diagnosis Date  . Diabetes mellitus, type 2 ([place])   . Obesity   . Hyperlipidemia   . Hypertension   . Colon polyps     Past Surgical History  Procedure Laterality Date  . Cholecystectomy  2001    ACUTE CHOLECYSTITIS/GALLSTONES  . Bilateral tubal ligation  1979  . Upper gastrointestinal endoscopy  NOV 2008 [facility] ANEMIA    NL EXAM, urease neg  . Colonoscopy w/ polypectomy  NOV 2008 [facility] ANEMIA    POLYP NO RETRIEVED  . Colonoscopy w/ polypectomy  2006 DR. SMTH    POLYP?  Marland Kitchen Colonoscopy  05/14/2011    Dr. Oneida Alar: sessile polyp in sigmoid colon, internal hemorrhoids, hyerplastic polyps  . Esophagogastroduodenoscopy  05/14/2011    Dr. Oneida Alar: sessile polyps in the cardia, mild gastritis. Chronic duodenitis consistent with peptic duodenitis, chronic active H.pylori gastritis.     Prior to Admission medications   Medication Sig Start Date End Date Taking? Authorizing Provider  aspirin 81 MG tablet Take 81 mg by mouth daily.   Yes Historical Provider, MD  Calcium Citrate-Vitamin D (CITRACAL PETITES/VITAMIN D) 200-250 MG-UNIT TABS Take 1 tablet by mouth daily.    Yes Historical Provider, MD  ergocalciferol (VITAMIN D2) 50000 UNITS capsule Take 1 capsule (50,000 Units total) by mouth once a week. One capsule once weekly 10/18/14  Yes Fayrene Helper, MD  Ferrous Sulfate (IRON) 325 (65 FE) MG TABS Take 1 tablet by mouth daily. 10/18/14  Yes Fayrene Helper, MD  glipiZIDE (GLUCOTROL) 10 MG tablet Take 1 tablet (10 mg total) by mouth 2 (two) times daily before a meal. 10/18/14  Yes Fayrene Helper, MD  LANTUS SOLOSTAR 100 UNIT/ML Solostar Pen INJECT 25 UNITS SUBCUTANEOUSLY ONCE DAILY 10:00  PM 01/09/15  Yes Fayrene Helper, MD  lisinopril (PRINIVIL,ZESTRIL) 20 MG  tablet TAKE ONE TABLET BY MOUTH ONCE DAILY 01/31/15  Yes Fayrene Helper, MD  metFORMIN (GLUCOPHAGE) 1000 MG tablet TAKE ONE TABLET BY MOUTH TWICE DAILY WITH A MEAL 05/09/15  Yes Fayrene Helper, MD  metFORMIN (GLUCOPHAGE) 1000 MG tablet TAKE ONE TABLET BY MOUTH TWICE DAILY WITH A MEAL 05/11/15  Yes Fayrene Helper, MD  Multiple Vitamin (MULTIVITAMINS PO) Take 1 tablet by mouth daily.    Yes Historical Provider, MD  Na Sulfate-K Sulfate-Mg Sulf SOLN Take 1 Container by mouth once. 04/25/15 05/25/15 Yes Orvil Feil, NP  pravastatin (PRAVACHOL) 80 MG tablet TAKE ONE TABLET BY MOUTH ONCE DAILY IN THE EVENING 03/27/15  Yes Fayrene Helper, MD    Allergies as of 04/25/2015 - Review Complete 04/25/2015  Allergen Reaction Noted  . Penicillins  08/20/2007  . Tetanus toxoids  07/17/2010    Family History  Problem Relation Age of Onset  . Cancer Mother     breast   . Heart disease Mother   . Diabetes Mother   . Hypertension Mother   . Stroke Mother   . Diabetes Father   . Heart attack Father   . Kidney disease Father   . Hypertension Sister   . Diabetes Sister   . Diabetes Sister   . Mental illness Sister   . Hypertension Sister   . Mental illness Sister   . Diabetes Sister   . Hypertension Sister   .  Mental illness Sister   . Colon cancer Sister     may have been in her 16s when diagnosed  . Diabetes Brother     Social History   Social History  . Marital Status: Married    Spouse Name: N/A  . Number of Children: N/A  . Years of Education: N/A   Occupational History  . Not on file.   Social History Main Topics  . Smoking status: Never Smoker   . Smokeless tobacco: Not on file  . Alcohol Use: No  . Drug Use: No  . Sexual Activity: Not on file   Other Topics Concern  . Not on file   Social History Narrative    Review of Systems: See HPI, otherwise negative ROS   Physical Exam: BP 110/73 mmHg  Pulse 95  Temp(Src) 98 F (36.7 C) (Oral)  Resp 24  SpO2  99% General:   Alert,  pleasant and cooperative in NAD Head:  Normocephalic and atraumatic. Neck:  Supple; Lungs:  Clear throughout to auscultation.    Heart:  Regular rate and rhythm. Abdomen:  Soft, nontender and nondistended. Normal bowel sounds, without guarding, and without rebound.   Neurologic:  Alert and  oriented x4;  grossly normal neurologically.  Impression/Plan:    Anemia/heme pos stools. PLAN: 1. TCS/?EGD TODAY

## 2015-05-15 NOTE — Op Note (Signed)
[facility] [address] [place], [address]   ENDOSCOPY PROCEDURE REPORT  PATIENT: Helen, Mahoney  MR#: [id number] BIRTHDATE: 08-Mar-1951 , 68  yrs. old GENDER: female  ENDOSCOPIST: Danie Binder, MD REFERRED [id number] Moshe Cipro, M.D. PROCEDURE DATE: 06-13-15 PROCEDURE:   EGD w/ biopsy  INDICATIONS:iron deficiency anemia.   dyspepsia. HAD EGD/TCS 2013 FOR FEDA. MEDICATIONS: Demerol 25 mg IV and Versed 2 mg IV  MD INITATED [id number]. ENDOSCOPY COMPLETE: 0946 TOPICAL ANESTHETIC:   Viscous Xylocaine  ASA CLASS:  DESCRIPTION OF PROCEDURE:     Physical exam was performed.  Informed consent was obtained from the patient after explaining the benefits, risks, and alternatives to the procedure.  The patient was connected to the monitor and placed in the left lateral position.  Continuous oxygen was provided by nasal cannula and IV medicine administered through an indwelling cannula.  After administration of sedation, the patients esophagus was intubated and the [id number]  endoscope was advanced under direct visualization to the second portion of the duodenum.  The scope was removed slowly by carefully examining the color, texture, anatomy, and integrity of the mucosa on the way out.  The patient was recovered in endoscopy and discharged home in satisfactory condition.  Estimated blood loss is zero unless otherwise noted in this procedure report.    ESOPHAGUS: There was a stricture at the gastroesophageal junction. The stricture was easily traversable.   STOMACH: A small hiatal hernia was noted.   Two sessile polyps ranging between 3-56mm in size with friable surfaces were found.  A polypectomy was performed with snare cautery.  Complete hemostasis was achieved by placing two [place] Resolution hemoclips on the bleeding site(s). DUODENUM: The duodenal mucosa showed no abnormalities in the bulb and 2nd part of the duodenum.  Cold forceps  biopsies were taken in the bulb and second portion. COMPLICATIONS: There were no immediate complications.  ENDOSCOPIC IMPRESSION: 1.  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  Small hiatal hernia 3.  FEDA MOST LIKELY DUE TO GASTRIC POLYPS AND/OR INABLITY TO ABSORB ORAL IRON  RECOMMENDATIONS: NO MRI FOR 30 DAYS DUE TO METAL CLIPS PLACED IN THE stomach. DRINK WATER. FOLLOW A HIGH FIBER/LOW FAT DIET. AWAIT BIOPSY RESULTS.  IF NO H PYLORI, WILL HOLD ASA FOR 3 MS.  STOP IRON.  REPEAT CBC/FERRITIN.  CONSIDER GIVENS. FOLLOW UP IN 3 MOS WITH DR.  Richetta Cubillos. Next colonoscopy in 5-10 years.  REPEAT EXAM      eSigned:  Danie Binder, MD 06-13-15 3:49 PM   CPT CODES: ICD CODES:  The ICD and CPT codes recommended by this software are interpretations from the data that the clinical staff has captured with the software.  The verification of the translation of this report to the ICD and CPT codes and modifiers is the sole responsibility of the health care institution and practicing physician where this report was generated.  [employer]. will not be held responsible for the validity of the ICD and CPT codes included on this report.  AMA assumes no liability for data contained or not contained herein. CPT is a Designer, television/film set of the [employer].

## 2015-05-18 ENCOUNTER — Encounter: Payer: Self-pay | Admitting: Gastroenterology

## 2015-05-18 ENCOUNTER — Other Ambulatory Visit: Payer: Self-pay

## 2015-05-18 ENCOUNTER — Telehealth: Payer: Self-pay

## 2015-05-18 ENCOUNTER — Ambulatory Visit (INDEPENDENT_AMBULATORY_CARE_PROVIDER_SITE_OTHER): Payer: BLUE CROSS/BLUE SHIELD | Admitting: Gastroenterology

## 2015-05-18 VITALS — BP 148/88 | HR 94 | Temp 97.1°F | Ht 66.0 in | Wt 250.8 lb

## 2015-05-18 DIAGNOSIS — D509 Iron deficiency anemia, unspecified: Secondary | ICD-10-CM

## 2015-05-18 DIAGNOSIS — D649 Anemia, unspecified: Secondary | ICD-10-CM

## 2015-05-18 DIAGNOSIS — D563 Thalassemia minor: Secondary | ICD-10-CM | POA: Insufficient documentation

## 2015-05-18 HISTORY — DX: Thalassemia minor: D56.3

## 2015-05-18 HISTORY — DX: Iron deficiency anemia, unspecified: D50.9

## 2015-05-18 NOTE — Telephone Encounter (Signed)
Called patients insurance and they told us that ir had termed. I called patient to see if she was aware and she said that she we going to have new insurance the 1st of February. I asked her to call us back with the card ID number so we could get this GIVENS approved. She is aware that if she does not call us she will be self pay.

## 2015-05-18 NOTE — Patient Instructions (Signed)
I WILL ORDER THE GIVENS CAPSULE STUDY TO BE COMPLETED IN 2 WEEKS.  YOU WILL NEED TO HOLD YOUR IRON 7 DAYS BEFORE THE STUDY.  ON THE NIGHT BEFORE THE STUDY YOU SHOULD TAKE HALF OF YOUR LANTUS DOSE.  ON THE MORNING OF YOUR STUDY HOLD THE GLIPIZIDE. YOU MAY CONTINUE YOUR GLUCOPHAGE.  FOLLOW UP IN 4 MOS. YOU WILL NEED A REPEAT CBC AND FERRITIN ONE WEEK PRIOR TO YOUR NEXT VISIT.

## 2015-05-18 NOTE — Assessment & Plan Note (Signed)
ASSOCIATED WITH HEME POSITIVE STOOL DEC 2016 BUT NO BRBPR OR MELENA.  REVIEWED PATHOLOGY RESULTS AND ENDOSCOPY WITH PT. NO OBVIOUS SOURCE FOR HER LOW BLOOD COUNT WAS IDENTIFIED.  PROCEED WITH GIVENS ENDOSCOPY. DISCUSSED PROCEDURE, BENEFITS, & RISKS WITH PT. ORDER THE GIVENS CAPSULE STUDY TO BE COMPLETED IN 2 WEEKS. HOLD YOUR IRON 7 DAYS BEFORE THE STUDY. ON THE NIGHT BEFORE THE STUDY TAKE HALF OF YOUR LANTUS DOSE. ON THE MORNING OF STUDY HOLD THE GLIPIZIDE. CONTINUE YOUR GLUCOPHAGE. FOLLOW UP IN 4 MOS. WILL NEED A REPEAT CBC AND FERRITIN ONE WEEK PRIOR TO YOUR NEXT VISIT.

## 2015-05-18 NOTE — Progress Notes (Signed)
Subjective:    Patient ID: Helen Mahoney, female    DOB: 07-Jan-1951, 65 y.o.   MRN: [id number]  Tula Nakayama, MD  HPI Last seen Jan 16 for endoscopy. Started back on iron. Taking asa daily. Wants to know about clips in her stomach. BMs: DAILY MOST OF THE TIME. PT DENIES FEVER, CHILLS, HEMATOCHEZIA, nausea, vomiting, melena, diarrhea, CHEST PAIN, SHORTNESS OF BREATH, CHANGE IN BOWEL IN HABITS, constipation, abdominal pain, problems swallowing, problems with sedation, or heartburn or indigestion.  Past Medical History  Diagnosis Date  . Diabetes mellitus, type 2 ([place])   . Obesity   . Hyperlipidemia   . Hypertension   . Colon polyps    Past Surgical History  Procedure Laterality Date  . Cholecystectomy  2001    ACUTE CHOLECYSTITIS/GALLSTONES  . Bilateral tubal ligation  1979  . Upper gastrointestinal endoscopy  NOV 2008 [facility] ANEMIA    NL EXAM, urease neg  . Colonoscopy w/ polypectomy  NOV 2008 [facility]J ANEMIA    POLYP NO RETRIEVED  . Colonoscopy w/ polypectomy  2006 DR. SMTH    POLYP?  Marland Kitchen Colonoscopy  05/14/2011    Dr. Oneida Alar: sessile polyp in sigmoid colon, internal hemorrhoids, hyerplastic polyps  . Esophagogastroduodenoscopy  05/14/2011    Dr. Oneida Alar: sessile polyps in the cardia, mild gastritis. Chronic duodenitis consistent with peptic duodenitis, chronic active H.pylori gastritis.    Allergies  Allergen Reactions  . Tetanus Toxoids     Can't walk the next day(happened twice)   . Penicillins Rash    Current Outpatient Prescriptions  Medication Sig Dispense Refill  . aspirin 81 MG tablet Take 81 mg by mouth daily.    . Calcium Citrate-Vitamin D (CITRACAL PETITES/VITAMIN D) 200-250 MG-UNIT TABS Take 1 tablet by mouth daily.     . ergocalciferol (VITAMIN D2) 50000 UNITS capsule Take 1 capsule (50,000 Units total) by mouth once a week. One capsule once weekly    . Ferrous Sulfate (IRON) 325 (65 FE) MG TABS Take 1 tablet by mouth daily.    Marland Kitchen glipiZIDE (GLUCOTROL) 10 MG  tablet Take 1 tablet (10 mg total) by mouth 2 (two) times daily before a meal.    . LANTUS SOLOSTAR 100 UNIT/ML Solostar Pen INJECT 25 UNITS SUBCUTANEOUSLY ONCE DAILY 10:00  PM    . lisinopril (PRINIVIL,ZESTRIL) 20 MG tablet TAKE ONE TABLET BY MOUTH ONCE DAILY    . metFORMIN (GLUCOPHAGE) 1000 MG tablet TAKE ONE TABLET BY MOUTH TWICE DAILY WITH A MEAL    . metFORMIN (GLUCOPHAGE) 1000 MG tablet TAKE ONE TABLET BY MOUTH TWICE DAILY WITH A MEAL    . Multiple Vitamin (MULTIVITAMINS PO) Take 1 tablet by mouth daily.     .      . pravastatin (PRAVACHOL) 80 MG tablet TAKE ONE TABLET BY MOUTH ONCE DAILY IN THE EVENING     Review of Systems PER HPI OTHERWISE ALL SYSTEMS ARE NEGATIVE.    Objective:   Physical Exam  Constitutional: She is oriented to person, place, and time. She appears well-developed and well-nourished. No distress.  HENT:  Head: Normocephalic and atraumatic.  Mouth/Throat: Oropharynx is clear and moist. No oropharyngeal exudate.  Eyes: Pupils are equal, round, and reactive to light. No scleral icterus.  Neck: Normal range of motion. Neck supple.  Cardiovascular: Normal rate, regular rhythm and normal heart sounds.   Pulmonary/Chest: Effort normal and breath sounds normal. No respiratory distress.  Abdominal: Soft. Bowel sounds are normal. She exhibits no distension. There is no tenderness.  Musculoskeletal: She exhibits no edema.  Lymphadenopathy:    She has no cervical adenopathy.  Neurological: She is alert and oriented to person, place, and time.  NO FOCAL DEFICITS  Psychiatric: She has a normal mood and affect.  Vitals reviewed.     Assessment & Plan:

## 2015-05-19 NOTE — Progress Notes (Signed)
ON RECALL  °

## 2015-05-19 NOTE — Telephone Encounter (Signed)
Pt called office and provided her [employer] ID number.   [id number]  Pt will be bringing by insurance card next week to be scanned in system

## 2015-05-22 NOTE — Progress Notes (Signed)
CC'ED TO PCP 

## 2015-05-23 ENCOUNTER — Telehealth: Payer: Self-pay

## 2015-05-23 NOTE — Telephone Encounter (Signed)
Card scanned into system

## 2015-05-23 NOTE — Telephone Encounter (Signed)
Noted  

## 2015-05-23 NOTE — Telephone Encounter (Signed)
PATIENT INSURANCE CARDS SCANNED IN

## 2015-05-31 ENCOUNTER — Telehealth: Payer: Self-pay | Admitting: Gastroenterology

## 2015-05-31 NOTE — Telephone Encounter (Signed)
Per [employer]. No PA is required for 91110/ Givens

## 2015-05-31 NOTE — Telephone Encounter (Signed)
Faxed PA information request to [employer]   Awaiting PA approval

## 2015-06-02 ENCOUNTER — Ambulatory Visit (HOSPITAL_COMMUNITY)
Admission: RE | Admit: 2015-06-02 | Discharge: 2015-06-02 | Disposition: A | Discharge status: Home / Self Care | Payer: Commercial Managed Care - HMO | Source: Ambulatory Visit | Attending: Gastroenterology | Admitting: Gastroenterology

## 2015-06-02 ENCOUNTER — Encounter (HOSPITAL_COMMUNITY)
Admission: RE | Disposition: A | Discharge status: Home / Self Care | Payer: Self-pay | Source: Ambulatory Visit | Attending: Gastroenterology

## 2015-06-02 DIAGNOSIS — D509 Iron deficiency anemia, unspecified: Secondary | ICD-10-CM | POA: Insufficient documentation

## 2015-06-02 DIAGNOSIS — D6489 Other specified anemias: Secondary | ICD-10-CM

## 2015-06-02 DIAGNOSIS — K6389 Other specified diseases of intestine: Secondary | ICD-10-CM | POA: Insufficient documentation

## 2015-06-02 DIAGNOSIS — K558 Other vascular disorders of intestine: Secondary | ICD-10-CM | POA: Insufficient documentation

## 2015-06-02 HISTORY — PX: GIVENS CAPSULE STUDY: SHX5432

## 2015-06-02 SURGERY — IMAGING PROCEDURE, GI TRACT, INTRALUMINAL, VIA CAPSULE

## 2015-06-02 MED ORDER — SIMETHICONE 40 MG/0.6ML PO SUSP
ORAL | Status: AC
  Filled 2015-06-02: qty 30

## 2015-06-05 ENCOUNTER — Encounter (HOSPITAL_COMMUNITY): Payer: Self-pay | Admitting: Gastroenterology

## 2015-06-05 ENCOUNTER — Other Ambulatory Visit: Payer: Self-pay

## 2015-06-05 DIAGNOSIS — E559 Vitamin D deficiency, unspecified: Secondary | ICD-10-CM

## 2015-06-05 MED ORDER — LISINOPRIL 20 MG PO TABS: 20.0000 mg | ORAL_TABLET | Freq: Every day | ORAL | Status: DC

## 2015-06-05 MED ORDER — GLIPIZIDE 10 MG PO TABS: 10.0000 mg | ORAL_TABLET | Freq: Two times a day (BID) | ORAL | Status: DC

## 2015-06-05 MED ORDER — PRAVASTATIN SODIUM 80 MG PO TABS: ORAL_TABLET | ORAL | Status: DC

## 2015-06-05 MED ORDER — METFORMIN HCL 1000 MG PO TABS: 1000.0000 mg | ORAL_TABLET | Freq: Two times a day (BID) | ORAL | Status: DC

## 2015-06-05 MED ORDER — ERGOCALCIFEROL 1.25 MG (50000 UT) PO CAPS: 50000.0000 [IU] | ORAL_CAPSULE | ORAL | Status: DC

## 2015-06-11 NOTE — Procedures (Signed)
PT HAS SIGNIFICANT PMHx: FEDA SINCE 2008. SINCE 2006 PT HAS HAD [id number], 2008, 2013, 2017), AND [id number], 2013, 2017), GIVENS: 2017. PATHOLOGY 2013-H PYLORI GASTRITIS/PEPTIC DUODENITIS, AND IN 2017: LYMPHOCYTIC GASTRITIS  PATIENT DATA: 249 LBS, WAIST: 45  IN, GASTRIC PASSAGE TIME: 20 m, SB PASSAGE TIME: 1H 81m  RESULTS: LIMITED views of gastric mucosa due to retained contents. No blood in the stomach. OCCASIONAL EROSION IN [id number]).  No masses, ULCERS, or AVMs SEEN. OCCASIONAL LYMPHANGECTASIA(1:00:46). NO OLD BLOOD OR FRESH BLOOD SEEN IN SMALL BOWEL. LIMITED VIEWS OF THE COLON DUE TO RETAINED CONTENTS.  DIAGNOSIS: MILD SMALL BOWEL ENTEROPATHY, MILD LYMPHANGECTASIA  Plan: 1. HEMATOLOGY/ONCOLOGY CONSULT FOR ANEMIA AND LYMPHOCYTIC GASTRITIS 2. AVOID ASA/NSAIDS. 3. OPV IN 4 MOS

## 2015-06-11 NOTE — Telephone Encounter (Addendum)
Called patient TO DISCUSS CONCERNS. LVM-CALL [phone] TO DISCUSS. GASTRIC BIOPSIES SHOW LYMPHOCYTIC GASTRITIS. GIVES SHOWS OCCASIONAL SMALL BOWEL EROSION. NO SOURCE FOR ANEMIA IDENTIFIED. NEEDS:  1. HEMATOLOGY/ONCOLOGY REFERRAL FOR ANEMIA AND LYMPHOCYTIC GASTRITIS. 2. AVOID ASA/NSAIDS. 3. OPV IN 4 MOS E30 SLF LYMPHOCYTIC GASTRITIS/ANEMIA.

## 2015-06-12 ENCOUNTER — Other Ambulatory Visit: Payer: Self-pay

## 2015-06-12 DIAGNOSIS — D649 Anemia, unspecified: Secondary | ICD-10-CM

## 2015-06-12 DIAGNOSIS — K2960 Other gastritis without bleeding: Secondary | ICD-10-CM

## 2015-06-12 NOTE — Telephone Encounter (Signed)
Reminder in epic °

## 2015-06-12 NOTE — Telephone Encounter (Signed)
Referral made in Epic.  

## 2015-06-14 ENCOUNTER — Telehealth: Payer: Self-pay | Admitting: Gastroenterology

## 2015-06-14 NOTE — Telephone Encounter (Signed)
Pt called to say that SF had left her message on Sunday to discuss her concerns (results) and wanted SF to know that she was going to the cancer center at [facility] tomorrow at 2pm.  I told her that SF was seeing patients at the moment but I would let her know that she had called. You can reach her at [phone] and to call before 230pm or after 430pm.

## 2015-06-14 NOTE — Telephone Encounter (Signed)
Forwarding the note to Dr. Oneida Alar.

## 2015-06-15 ENCOUNTER — Encounter (HOSPITAL_COMMUNITY): Payer: Commercial Managed Care - HMO | Attending: Hematology & Oncology | Admitting: Hematology & Oncology

## 2015-06-15 ENCOUNTER — Encounter (HOSPITAL_COMMUNITY): Payer: Self-pay | Admitting: Hematology & Oncology

## 2015-06-15 VITALS — BP 109/71 | HR 90 | Temp 98.9°F | Resp 16 | Ht 66.0 in | Wt 249.0 lb

## 2015-06-15 DIAGNOSIS — Z794 Long term (current) use of insulin: Secondary | ICD-10-CM | POA: Diagnosis not present

## 2015-06-15 DIAGNOSIS — Z9049 Acquired absence of other specified parts of digestive tract: Secondary | ICD-10-CM | POA: Diagnosis not present

## 2015-06-15 DIAGNOSIS — E119 Type 2 diabetes mellitus without complications: Secondary | ICD-10-CM | POA: Diagnosis not present

## 2015-06-15 DIAGNOSIS — Z833 Family history of diabetes mellitus: Secondary | ICD-10-CM | POA: Insufficient documentation

## 2015-06-15 DIAGNOSIS — Z803 Family history of malignant neoplasm of breast: Secondary | ICD-10-CM | POA: Diagnosis not present

## 2015-06-15 DIAGNOSIS — Z7982 Long term (current) use of aspirin: Secondary | ICD-10-CM | POA: Insufficient documentation

## 2015-06-15 DIAGNOSIS — Z7984 Long term (current) use of oral hypoglycemic drugs: Secondary | ICD-10-CM | POA: Insufficient documentation

## 2015-06-15 DIAGNOSIS — Z9889 Other specified postprocedural states: Secondary | ICD-10-CM | POA: Insufficient documentation

## 2015-06-15 DIAGNOSIS — Z79899 Other long term (current) drug therapy: Secondary | ICD-10-CM | POA: Diagnosis not present

## 2015-06-15 DIAGNOSIS — D509 Iron deficiency anemia, unspecified: Secondary | ICD-10-CM | POA: Diagnosis not present

## 2015-06-15 DIAGNOSIS — Z808 Family history of malignant neoplasm of other organs or systems: Secondary | ICD-10-CM | POA: Diagnosis not present

## 2015-06-15 DIAGNOSIS — Z86010 Personal history of colonic polyps: Secondary | ICD-10-CM | POA: Insufficient documentation

## 2015-06-15 DIAGNOSIS — Z880 Allergy status to penicillin: Secondary | ICD-10-CM | POA: Insufficient documentation

## 2015-06-15 DIAGNOSIS — Z823 Family history of stroke: Secondary | ICD-10-CM | POA: Diagnosis not present

## 2015-06-15 DIAGNOSIS — E538 Deficiency of other specified B group vitamins: Secondary | ICD-10-CM | POA: Diagnosis not present

## 2015-06-15 DIAGNOSIS — Z888 Allergy status to other drugs, medicaments and biological substances status: Secondary | ICD-10-CM | POA: Insufficient documentation

## 2015-06-15 DIAGNOSIS — E669 Obesity, unspecified: Secondary | ICD-10-CM | POA: Insufficient documentation

## 2015-06-15 DIAGNOSIS — R195 Other fecal abnormalities: Secondary | ICD-10-CM

## 2015-06-15 DIAGNOSIS — I10 Essential (primary) hypertension: Secondary | ICD-10-CM | POA: Insufficient documentation

## 2015-06-15 DIAGNOSIS — E785 Hyperlipidemia, unspecified: Secondary | ICD-10-CM | POA: Diagnosis not present

## 2015-06-15 LAB — COMPREHENSIVE METABOLIC PANEL
ALBUMIN: 3.4 g/dL — AB (ref 3.5–5.0)
ALK PHOS: 107 U/L (ref 38–126)
ALT: 20 U/L (ref 14–54)
AST: 21 U/L (ref 15–41)
Anion gap: 9 (ref 5–15)
BUN: 21 mg/dL — ABNORMAL HIGH (ref 6–20)
CALCIUM: 8.8 mg/dL — AB (ref 8.9–10.3)
CHLORIDE: 103 mmol/L (ref 101–111)
CO2: 24 mmol/L (ref 22–32)
CREATININE: 0.88 mg/dL (ref 0.44–1.00)
Glucose, Bld: 128 mg/dL — ABNORMAL HIGH (ref 65–99)
Potassium: 4.1 mmol/L (ref 3.5–5.1)
Sodium: 136 mmol/L (ref 135–145)
Total Bilirubin: 0.3 mg/dL (ref 0.3–1.2)
Total Protein: 7.3 g/dL (ref 6.5–8.1)

## 2015-06-15 LAB — CBC WITH DIFFERENTIAL/PLATELET
Basophils Absolute: 0 10*3/uL (ref 0.0–0.1)
Basophils Relative: 0 %
EOS ABS: 0.4 10*3/uL (ref 0.0–0.7)
Eosinophils Relative: 5 %
HEMATOCRIT: 32.2 % — AB (ref 36.0–46.0)
HEMOGLOBIN: 9.7 g/dL — AB (ref 12.0–15.0)
LYMPHS ABS: 2.1 10*3/uL (ref 0.7–4.0)
LYMPHS PCT: 23 %
MCH: 20.6 pg — AB (ref 26.0–34.0)
MCHC: 30.1 g/dL (ref 30.0–36.0)
MCV: 68.4 fL — ABNORMAL LOW (ref 78.0–100.0)
MONOS PCT: 6 %
Monocytes Absolute: 0.5 10*3/uL (ref 0.1–1.0)
NEUTROS ABS: 5.9 10*3/uL (ref 1.7–7.7)
NEUTROS PCT: 66 %
Platelets: 382 10*3/uL (ref 150–400)
RBC: 4.71 MIL/uL (ref 3.87–5.11)
RDW: 17 % — ABNORMAL HIGH (ref 11.5–15.5)
WBC: 9 10*3/uL (ref 4.0–10.5)

## 2015-06-15 LAB — RETICULOCYTES
RBC.: 4.71 MIL/uL (ref 3.87–5.11)
RETIC CT PCT: 0.9 % (ref 0.4–3.1)
Retic Count, Absolute: 42.4 10*3/uL (ref 19.0–186.0)

## 2015-06-15 LAB — LACTATE DEHYDROGENASE: LDH: 146 U/L (ref 98–192)

## 2015-06-15 LAB — FOLATE: Folate: 6 ng/mL (ref >5.9)

## 2015-06-15 LAB — VITAMIN B12: Vitamin B-12: 170 pg/mL — ABNORMAL LOW (ref 180–914)

## 2015-06-15 NOTE — Telephone Encounter (Signed)
Pt called this AM to find out what Dr. Oneida Alar had wanted to discuss with her. She said the cancer center had called her and she has appt there this afternoon. She will be home until 1:30 today if Dr. Oneida Alar would like to reach her. I told her what the result note said and that she has anemia is the reason that she was referred to Hematology.

## 2015-06-15 NOTE — Patient Instructions (Addendum)
[name] at [facility] Discharge Instructions  RECOMMENDATIONS MADE BY THE CONSULTANT AND ANY TEST RESULTS WILL BE SENT TO YOUR REFERRING PHYSICIAN.   Exam and discussion by Dr Whitney Muse today Your iron is low, and if we fix your iron level will it fix your anemia. Blood work today More than likely give you iron through an IV Return to see the doctor in 1 month with lab work Please call the clinic if you have any questions or concerns   Thank you for choosing [place] at [facility] to provide your oncology and hematology care.  To afford each patient quality time with our provider, please arrive at least 15 minutes before your scheduled appointment time.   Beginning January 23rd 2017 lab work for the [facility] will be done in the  Main lab at [facility] on 1st floor. If you have a lab appointment with the [facility] please come in thru the  Main Entrance and check in at the main information desk  You need to re-schedule your appointment should you arrive 10 or more minutes late.  We strive to give you quality time with our providers, and arriving late affects you and other patients whose appointments are after yours.  Also, if you no show three or more times for appointments you may be dismissed from the clinic at the providers discretion.     Again, thank you for choosing [facility].  Our hope is that these requests will decrease the amount of time that you wait before being seen by our physicians.       _____________________________________________________________  Should you have questions after your visit to [facility], please contact our office at [phone] between the hours of 8:30 a.m. and 4:30 p.m.  Voicemails left after 4:30 p.m. will not be returned until the following business day.  For prescription refill requests, have your pharmacy contact our office.

## 2015-06-15 NOTE — Telephone Encounter (Signed)
Called patient TO DISCUSS CONCERNS. EXPLAINED SHE HAS A UNUSUAL FORM OF GASTRITIS AND IT MAY BE CAUSING HER TO NOT BE ABLE TO ABSORB IRON. HAS OPV W/ DR.PENLAND TODAY AT 2 PM.

## 2015-06-15 NOTE — Progress Notes (Signed)
Helen Mahoney's reason for visit today is for labs as scheduled per MD orders.  Venipuncture performed with a 23 gauge butterfly needle to [place] tolerated procedure well and without incident; questions were answered and patient was discharged.

## 2015-06-15 NOTE — Progress Notes (Signed)
[place] at [place] NOTE  Patient Care Team: Fayrene Helper, MD as PCP - General Danie Binder, MD as Consulting Physician (Gastroenterology)  CHIEF COMPLAINTS/PURPOSE OF CONSULTATION:  Microcytic Anemia Serum ferritin 38 ng/ml Colonoscopy 05/15/2015 with redundant L colon, diverticulosis, small internal hemorrhoids EGD 05/14/2014 with gastric polyps, GE junction stricture Givens Capsule 06/02/2015 OCCASIONAL EROSION IN DUODENUM No masses, ULCERS, or AVMs SEEN. OCCASIONAL LYMPHANGECTASIA   HISTORY OF PRESENTING ILLNESS:  Helen Mahoney 65 y.o. female is here because of low ferritin levels and anemia.  Mrs. Felici is accompanied by her sister. I personally reviewed and discussed laboratory results at length with the patient.   She is aware that she is here because of abnormal blood counts, specifically anemia. She had a colonoscopy and EGD performed by Dr. Oneida Alar. She also did a pill cam but has not received the results yet. She sees Dr. Moshe Cipro for primary care.   She eats ice, but states she does not crave it. She does not travel anywhere to purchase ice. She does not crave corn starch or clay.  Denies any breathing problems. Her energy levels are okay and she gets out. She enjoys shopping.   Her only bowel complaint is that the iron pills make her stool green. Denies any black, tarry stool or blood in stool.   When asked about joint pain, she complains of arthritis in the right knee. Denies chest pain or abdominal pain.   She receives a mammogram yearly. She had a flu shot this year.  She is here for evaluation of her abnormal blood counts.   MEDICAL HISTORY:  Past Medical History  Diagnosis Date  . Diabetes mellitus, type 2 ([place])   . Obesity   . Hyperlipidemia   . Hypertension   . Colon polyps     SURGICAL HISTORY: Past Surgical History  Procedure Laterality Date  . Cholecystectomy  2001    ACUTE CHOLECYSTITIS/GALLSTONES  .  Bilateral tubal ligation  1979  . Upper gastrointestinal endoscopy  NOV 2008 MJ ANEMIA    NL EXAM, urease neg  . Colonoscopy w/ polypectomy  NOV 2008 MJ ANEMIA    POLYP NO RETRIEVED  . Colonoscopy w/ polypectomy  2006 DR. SMTH    POLYP?  Marland Kitchen Colonoscopy  05/14/2011    Dr. Oneida Alar: sessile polyp in sigmoid colon, internal hemorrhoids, hyerplastic polyps  . Esophagogastroduodenoscopy  05/14/2011    Dr. Oneida Alar: sessile polyps in the cardia, mild gastritis. Chronic duodenitis consistent with peptic duodenitis, chronic active H.pylori gastritis.   . Colonoscopy N/A 05/15/2015    Procedure: COLONOSCOPY;  Surgeon: Danie Binder, MD;  Location: [facility] ENDO SUITE;  Service: Endoscopy;  Laterality: N/A;  0830  . Esophagogastroduodenoscopy N/A 05/15/2015    Procedure: ESOPHAGOGASTRODUODENOSCOPY (EGD);  Surgeon: Danie Binder, MD;  Location: [facility] ENDO SUITE;  Service: Endoscopy;  Laterality: N/A;  . Givens capsule study N/A 06/02/2015    Procedure: GIVENS CAPSULE STUDY;  Surgeon: Danie Binder, MD;  Location: [facility] ENDO SUITE;  Service: Endoscopy;  Laterality: N/A;  0700    SOCIAL HISTORY: Social History   Social History  . Marital Status: Married    Spouse Name: N/A  . Number of Children: N/A  . Years of Education: N/A   Occupational History  . Not on file.   Social History Main Topics  . Smoking status: Never Smoker   . Smokeless tobacco: Not on file  . Alcohol Use: No  . Drug Use: No  .  Sexual Activity: Not on file   Other Topics Concern  . Not on file   Social History Narrative  Married 15 years 2 children, both boys 4 grandchildren She does not work outside the home She enjoys shopping Non smoker ETOH, none  FAMILY HISTORY: Family History  Problem Relation Age of Onset  . Cancer Mother     breast   . Heart disease Mother   . Diabetes Mother   . Hypertension Mother   . Stroke Mother   . Diabetes Father   . Heart attack Father   . Kidney disease Father   . Hypertension Sister     . Diabetes Sister   . Diabetes Sister   . Mental illness Sister   . Hypertension Sister   . Mental illness Sister   . Diabetes Sister   . Hypertension Sister   . Mental illness Sister   . Colon cancer Sister     may have been in her 58s when diagnosed  . Diabetes Brother    indicated that her mother is deceased. She indicated that her father is deceased. She indicated that all of her four sisters are alive. She indicated that both of her brothers are deceased.  Mother deceased in her 20s Father deceased in his 33s. One parent died of heart trouble and another of pneumonia Her sibling died of excessive drinking Her sister had rectal colon cancer, diagnosed 6-7 years ago. She had surgery with Dr. Laural Golden. No chemotherapy or radiation treatment.  ALLERGIES:  is allergic to tetanus toxoids and penicillins.  MEDICATIONS:  Current Outpatient Prescriptions  Medication Sig Dispense Refill  . aspirin 81 MG tablet Take 81 mg by mouth daily.    . Calcium Citrate-Vitamin D (CITRACAL PETITES/VITAMIN D) 200-250 MG-UNIT TABS Take 1 tablet by mouth daily.     . ergocalciferol (VITAMIN D2) 50000 units capsule Take 1 capsule (50,000 Units total) by mouth once a week. One capsule once weekly 12 capsule 1  . Ferrous Sulfate (IRON) 325 (65 FE) MG TABS Take 1 tablet by mouth daily. 100 each 2  . glipiZIDE (GLUCOTROL) 10 MG tablet Take 1 tablet (10 mg total) by mouth 2 (two) times daily before a meal. 180 tablet 1  . LANTUS SOLOSTAR 100 UNIT/ML Solostar Pen INJECT 25 UNITS SUBCUTANEOUSLY ONCE DAILY 10:00  PM 15 mL 2  . lisinopril (PRINIVIL,ZESTRIL) 20 MG tablet Take 1 tablet (20 mg total) by mouth daily. 90 tablet 1  . metFORMIN (GLUCOPHAGE) 1000 MG tablet Take 1 tablet (1,000 mg total) by mouth 2 (two) times daily with a meal. 180 tablet 1  . Multiple Vitamin (MULTIVITAMINS PO) Take 1 tablet by mouth daily.     . pravastatin (PRAVACHOL) 80 MG tablet TAKE ONE TABLET BY MOUTH ONCE DAILY IN THE EVENING 90  tablet 1   No current facility-administered medications for this visit.    Review of Systems  Constitutional: Negative.  Negative for malaise/fatigue.  HENT: Negative.   Eyes: Negative.   Respiratory: Negative.  Negative for shortness of breath and wheezing.   Cardiovascular: Negative.  Negative for chest pain.  Gastrointestinal: Negative.  Negative for abdominal pain, blood in stool and melena.       Abnormal colored stool associated with oral iron intake.  Genitourinary: Negative.   Musculoskeletal: Positive for joint pain.       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  Skin: Negative.   Neurological: Negative.  Negative for weakness.  Endo/Heme/Allergies: Negative.   Psychiatric/Behavioral: Negative.  All other systems reviewed and are negative.  14 point ROS was done and is otherwise as detailed above or in HPI   PHYSICAL EXAMINATION: ECOG PERFORMANCE STATUS: 0 - Asymptomatic  Filed Vitals:   06/15/15 1429  BP: 109/71  Pulse: 90  Temp: 98.9 F (37.2 C)  Resp: 16   Filed Weights   06/15/15 1429  Weight: 249 lb (112.946 kg)    Physical Exam  Constitutional: She is oriented to person, place, and time and well-developed, well-nourished, and in no distress.  HENT:  Head: Normocephalic and atraumatic.  Nose: Nose normal.  Mouth/Throat: Oropharynx is clear and moist. No oropharyngeal exudate.  Eyes: Conjunctivae and EOM are normal. Pupils are equal, round, and reactive to light. Right eye exhibits no discharge. Left eye exhibits no discharge. No scleral icterus.  Neck: Normal range of motion. Neck supple. No tracheal deviation present. No thyromegaly present.  Cardiovascular: Normal rate, regular rhythm and normal heart sounds.  Exam reveals no gallop and no friction rub.   No murmur heard. Pulmonary/Chest: Effort normal and breath sounds normal. She has no wheezes. She has no rales.  Abdominal: Soft. Bowel sounds are normal. She exhibits no distension and no mass. There is no  tenderness. There is no rebound and no guarding.  Musculoskeletal: Normal range of motion. She exhibits no edema.  Lymphadenopathy:    She has no cervical adenopathy.  Neurological: She is alert and oriented to person, place, and time. She has normal reflexes. No cranial nerve deficit. Gait normal. Coordination normal.  Skin: Skin is warm and dry. No rash noted.  Psychiatric: Mood, memory, affect and judgment normal.  Nursing note and vitals reviewed.   LABORATORY DATA:  I have reviewed the data as listed Lab Results  Component Value Date   WBC 6.9 03/31/2015   HGB 9.8* 03/31/2015   HCT 32.8* 03/31/2015   MCV 68.2* 03/31/2015   PLT 363 03/31/2015   CMP     Component Value Date/Time   NA 139 03/31/2015 0909   K 4.4 03/31/2015 0909   CL 107 03/31/2015 0909   CO2 27 03/31/2015 0909   GLUCOSE 83 03/31/2015 0909   BUN 15 03/31/2015 0909   CREATININE 0.84 03/31/2015 0909   CREATININE 1.02 07/14/2010 1917   CALCIUM 8.6 03/31/2015 0909   PROT 6.4 03/31/2015 0909   ALBUMIN 3.6 03/31/2015 0909   AST 16 03/31/2015 0909   ALT 14 03/31/2015 0909   ALKPHOS 116 03/31/2015 0909   BILITOT 0.3 03/31/2015 0909   GFRNONAA 74 03/31/2015 0909   GFRAA 85 03/31/2015 0909   Results for ILAINA, BOULER (MRN [id number])  Ref. Range 09/21/2008 21:20 10/16/2010 14:20 05/01/2011 10:50 09/08/2012 10:00 10/02/2013 09:02 10/15/2014 08:31 03/31/2015 09:09 06/15/2015 15:10  Hemoglobin Latest Ref Range: 12.0-15.0 g/dL 10.5 (L) 10.4 (L) 10.2 (L) 9.7 (L) 10.1 (L) 9.6 (L) 9.8 (L) 9.7 (L)   Results for SHADELL, STEWART (MRN [id number])   Ref. Range 09/21/2008 21:20 10/16/2010 14:20 05/01/2011 10:50 09/08/2012 10:00 10/02/2013 09:02 10/15/2014 08:31 03/31/2015 09:09 06/15/2015 15:10  MCV Latest Ref Range: 78.0-100.0 fL 70.9 (L) 68.7 (L) 69.1 (L) 67.6 (L) 65.3 (L) 68.8 (L) 68.2 (L) 68.4 (L)    PATHOLOGY   RADIOGRAPHIC STUDIES: I have personally reviewed the radiological images as listed and agreed with the findings  in the report. No results found.     ASSESSMENT & PLAN:  Microcytic Anemia longstanding dating back to 2010 Serum ferritin 38 ng/ml Colonoscopy 05/15/2015 with redundant L colon, diverticulosis,  small internal hemorrhoids EGD 05/14/2014 with gastric polyps, GE junction stricture Givens Capsule 06/02/2015 OCCASIONAL EROSION IN DUODENUM No masses, ULCERS, or AVMs SEEN. OCCASIONAL LYMPHANGECTASIA H/O B12 deficiency  The patient had a colonoscopy, EGD, and pill cam with Dr. Oneida Alar on 06/02/2015. She has iron deficiency.  Her microcytic anemia is long standing dating back to at least 2010. I anticipate she will need an evaluation for thalassemia/hemoglobinopathy and we will address this at follow-up.   Mrs. Keeble will undergo additional labs today including:  Orders Placed This Encounter  Procedures  . CBC with Differential    Standing Status: Future     Number of Occurrences: 1     Standing Expiration Date: 06/14/2016  . Comprehensive metabolic panel    Standing Status: Future     Number of Occurrences: 1     Standing Expiration Date: 06/14/2016  . Reticulocytes    Standing Status: Future     Number of Occurrences: 1     Standing Expiration Date: 06/14/2016  . Vitamin B12    Standing Status: Future     Number of Occurrences: 1     Standing Expiration Date: 06/14/2016  . Folate    Standing Status: Future     Number of Occurrences: 1     Standing Expiration Date: 06/14/2016  . Lactate dehydrogenase    Standing Status: Future     Number of Occurrences: 1     Standing Expiration Date: 06/14/2016  . Haptoglobin    Standing Status: Future     Number of Occurrences: 1     Standing Expiration Date: 06/14/2016  . Kappa/lambda light chains    Standing Status: Future     Number of Occurrences: 1     Standing Expiration Date: 06/14/2016  . Protein electrophoresis, serum    Standing Status: Future     Number of Occurrences: 1     Standing Expiration Date: 06/14/2016  . CBC with  Differential    Standing Status: Future     Number of Occurrences:      Standing Expiration Date: 06/14/2016  . Reticulocytes    Standing Status: Future     Number of Occurrences:      Standing Expiration Date: 06/14/2016  . Ferritin    Standing Status: Future     Number of Occurrences:      Standing Expiration Date: 06/14/2016     She will return in 4 weeks for follow up with CBC and ferritin level check.   All questions were answered. The patient knows to call the clinic with any problems, questions or concerns.  This document serves as a record of services personally performed by Ancil Linsey, MD. It was created on her behalf by Arlyce Harman, a trained medical scribe. The creation of this record is based on the scribe's personal observations and the provider's statements to them. This document has been checked and approved by the attending provider.  I have reviewed the above documentation for accuracy and completeness, and I agree with the above.  This note was electronically signed.   Molli Hazard  06/15/2015 2:52 PM

## 2015-06-16 ENCOUNTER — Other Ambulatory Visit (HOSPITAL_COMMUNITY): Payer: Self-pay | Admitting: Hematology & Oncology

## 2015-06-16 DIAGNOSIS — E538 Deficiency of other specified B group vitamins: Secondary | ICD-10-CM

## 2015-06-16 HISTORY — DX: Deficiency of other specified B group vitamins: E53.8

## 2015-06-16 LAB — HAPTOGLOBIN: Haptoglobin: 240 mg/dL — ABNORMAL HIGH (ref 34–200)

## 2015-06-16 LAB — PROTEIN ELECTROPHORESIS, SERUM
A/G Ratio: 1 (ref 0.7–1.7)
ALPHA-2-GLOBULIN: 0.8 g/dL (ref 0.4–1.0)
Albumin ELP: 3.2 g/dL (ref 2.9–4.4)
Alpha-1-Globulin: 0.2 g/dL (ref 0.0–0.4)
BETA GLOBULIN: 1.3 g/dL (ref 0.7–1.3)
Gamma Globulin: 1 g/dL (ref 0.4–1.8)
Globulin, Total: 3.3 g/dL (ref 2.2–3.9)
Total Protein ELP: 6.5 g/dL (ref 6.0–8.5)

## 2015-06-16 LAB — KAPPA/LAMBDA LIGHT CHAINS
KAPPA FREE LGHT CHN: 33.31 mg/L — AB (ref 3.30–19.40)
Kappa, lambda light chain ratio: 1.5 (ref 0.26–1.65)
LAMDA FREE LIGHT CHAINS: 22.21 mg/L (ref 5.71–26.30)

## 2015-06-26 ENCOUNTER — Encounter (HOSPITAL_COMMUNITY): Payer: Commercial Managed Care - HMO

## 2015-06-26 ENCOUNTER — Encounter (HOSPITAL_BASED_OUTPATIENT_CLINIC_OR_DEPARTMENT_OTHER): Payer: Commercial Managed Care - HMO

## 2015-06-26 DIAGNOSIS — D509 Iron deficiency anemia, unspecified: Secondary | ICD-10-CM | POA: Diagnosis not present

## 2015-06-26 DIAGNOSIS — E538 Deficiency of other specified B group vitamins: Secondary | ICD-10-CM

## 2015-06-26 DIAGNOSIS — Z9889 Other specified postprocedural states: Secondary | ICD-10-CM | POA: Diagnosis not present

## 2015-06-26 DIAGNOSIS — E785 Hyperlipidemia, unspecified: Secondary | ICD-10-CM | POA: Diagnosis not present

## 2015-06-26 DIAGNOSIS — Z86010 Personal history of colonic polyps: Secondary | ICD-10-CM | POA: Diagnosis not present

## 2015-06-26 DIAGNOSIS — E119 Type 2 diabetes mellitus without complications: Secondary | ICD-10-CM | POA: Diagnosis not present

## 2015-06-26 DIAGNOSIS — I10 Essential (primary) hypertension: Secondary | ICD-10-CM | POA: Diagnosis not present

## 2015-06-26 DIAGNOSIS — Z9049 Acquired absence of other specified parts of digestive tract: Secondary | ICD-10-CM | POA: Diagnosis not present

## 2015-06-26 DIAGNOSIS — E669 Obesity, unspecified: Secondary | ICD-10-CM | POA: Diagnosis not present

## 2015-06-26 MED ORDER — CYANOCOBALAMIN 1000 MCG/ML IJ SOLN
1000.0000 ug | INTRAMUSCULAR | Status: DC
  Administered 2015-06-26: 1000 ug via INTRAMUSCULAR

## 2015-06-26 MED ORDER — CYANOCOBALAMIN 1000 MCG/ML IJ SOLN
INTRAMUSCULAR | Status: AC
  Filled 2015-06-26: qty 1

## 2015-06-26 NOTE — Progress Notes (Signed)
Helen Mahoney presents today for injection per MD orders. B12 1000mcg administered IM in right Upper Arm. Administration without incident. Patient tolerated well.  

## 2015-06-26 NOTE — Patient Instructions (Signed)
[facility] at [facility] Discharge Instructions  RECOMMENDATIONS MADE BY THE CONSULTANT AND ANY TEST RESULTS WILL BE SENT TO YOUR REFERRING PHYSICIAN.  Vitamin B12 1000 mcg injection given as ordered. Return as scheduled.  Thank you for choosing [facility] at [facility] to provide your oncology and hematology care.  To afford each patient quality time with our provider, please arrive at least 15 minutes before your scheduled appointment time.   Beginning January 23rd 2017 lab work for the Cancer Center will be done in the  Main lab at [facility] on 1st floor. If you have a lab appointment with the Cancer Center please come in thru the  Main Entrance and check in at the main information desk  You need to re-schedule your appointment should you arrive 10 or more minutes late.  We strive to give you quality time with our providers, and arriving late affects you and other patients whose appointments are after yours.  Also, if you no show three or more times for appointments you may be dismissed from the clinic at the providers discretion.     Again, thank you for choosing [facility].  Our hope is that these requests will decrease the amount of time that you wait before being seen by our physicians.       _____________________________________________________________  Should you have questions after your visit to [facility], please contact our office at [phone] between the hours of 8:30 a.m. and 4:30 p.m.  Voicemails left after 4:30 p.m. will not be returned until the following business day.  For prescription refill requests, have your pharmacy contact our office.    

## 2015-06-27 DIAGNOSIS — Z794 Long term (current) use of insulin: Secondary | ICD-10-CM | POA: Diagnosis not present

## 2015-06-27 DIAGNOSIS — E119 Type 2 diabetes mellitus without complications: Secondary | ICD-10-CM | POA: Diagnosis not present

## 2015-06-27 DIAGNOSIS — E559 Vitamin D deficiency, unspecified: Secondary | ICD-10-CM | POA: Diagnosis not present

## 2015-06-27 LAB — BASIC METABOLIC PANEL WITH GFR
BUN: 13 mg/dL (ref 7–25)
CHLORIDE: 105 mmol/L (ref 98–110)
CO2: 26 mmol/L (ref 20–31)
Calcium: 8.9 mg/dL (ref 8.6–10.4)
Creat: 0.88 mg/dL (ref 0.50–0.99)
GFR, Est African American: 80 mL/min (ref >=60)
GFR, Est Non African American: 69 mL/min (ref >=60)
GLUCOSE: 86 mg/dL (ref 65–99)
POTASSIUM: 4.3 mmol/L (ref 3.5–5.3)
Sodium: 139 mmol/L (ref 135–146)

## 2015-06-27 LAB — HEMOGLOBIN A1C
Hgb A1c MFr Bld: 7.7 % — ABNORMAL HIGH (ref <5.7)
Mean Plasma Glucose: 174 mg/dL — ABNORMAL HIGH (ref <117)

## 2015-06-27 LAB — VITAMIN D 25 HYDROXY (VIT D DEFICIENCY, FRACTURES): Vit D, 25-Hydroxy: 54 ng/mL (ref 30–100)

## 2015-06-27 LAB — INTRINSIC FACTOR ANTIBODIES: Intrinsic Factor: 1.1 AU/mL (ref 0.0–1.1)

## 2015-07-03 ENCOUNTER — Ambulatory Visit (HOSPITAL_COMMUNITY): Payer: Commercial Managed Care - HMO

## 2015-07-03 ENCOUNTER — Ambulatory Visit: Payer: BLUE CROSS/BLUE SHIELD | Admitting: Family Medicine

## 2015-07-03 ENCOUNTER — Encounter (HOSPITAL_COMMUNITY): Payer: Commercial Managed Care - HMO | Attending: Hematology & Oncology

## 2015-07-03 DIAGNOSIS — Z86010 Personal history of colonic polyps: Secondary | ICD-10-CM | POA: Insufficient documentation

## 2015-07-03 DIAGNOSIS — Z79899 Other long term (current) drug therapy: Secondary | ICD-10-CM | POA: Insufficient documentation

## 2015-07-03 DIAGNOSIS — E119 Type 2 diabetes mellitus without complications: Secondary | ICD-10-CM | POA: Insufficient documentation

## 2015-07-03 DIAGNOSIS — I10 Essential (primary) hypertension: Secondary | ICD-10-CM | POA: Insufficient documentation

## 2015-07-03 DIAGNOSIS — Z7984 Long term (current) use of oral hypoglycemic drugs: Secondary | ICD-10-CM | POA: Insufficient documentation

## 2015-07-03 DIAGNOSIS — Z880 Allergy status to penicillin: Secondary | ICD-10-CM | POA: Insufficient documentation

## 2015-07-03 DIAGNOSIS — Z803 Family history of malignant neoplasm of breast: Secondary | ICD-10-CM | POA: Insufficient documentation

## 2015-07-03 DIAGNOSIS — E785 Hyperlipidemia, unspecified: Secondary | ICD-10-CM | POA: Insufficient documentation

## 2015-07-03 DIAGNOSIS — Z823 Family history of stroke: Secondary | ICD-10-CM | POA: Insufficient documentation

## 2015-07-03 DIAGNOSIS — Z794 Long term (current) use of insulin: Secondary | ICD-10-CM | POA: Insufficient documentation

## 2015-07-03 DIAGNOSIS — Z9889 Other specified postprocedural states: Secondary | ICD-10-CM | POA: Insufficient documentation

## 2015-07-03 DIAGNOSIS — Z9049 Acquired absence of other specified parts of digestive tract: Secondary | ICD-10-CM | POA: Insufficient documentation

## 2015-07-03 DIAGNOSIS — E538 Deficiency of other specified B group vitamins: Secondary | ICD-10-CM | POA: Insufficient documentation

## 2015-07-03 DIAGNOSIS — Z7982 Long term (current) use of aspirin: Secondary | ICD-10-CM | POA: Insufficient documentation

## 2015-07-03 DIAGNOSIS — Z808 Family history of malignant neoplasm of other organs or systems: Secondary | ICD-10-CM | POA: Insufficient documentation

## 2015-07-03 DIAGNOSIS — Z833 Family history of diabetes mellitus: Secondary | ICD-10-CM | POA: Insufficient documentation

## 2015-07-03 DIAGNOSIS — D509 Iron deficiency anemia, unspecified: Secondary | ICD-10-CM | POA: Insufficient documentation

## 2015-07-03 DIAGNOSIS — Z888 Allergy status to other drugs, medicaments and biological substances status: Secondary | ICD-10-CM | POA: Insufficient documentation

## 2015-07-03 DIAGNOSIS — E669 Obesity, unspecified: Secondary | ICD-10-CM | POA: Insufficient documentation

## 2015-07-03 MED ORDER — CYANOCOBALAMIN 1000 MCG/ML IJ SOLN
INTRAMUSCULAR | Status: AC
  Filled 2015-07-03: qty 1

## 2015-07-03 MED ORDER — CYANOCOBALAMIN 1000 MCG/ML IJ SOLN
1000.0000 ug | INTRAMUSCULAR | Status: DC
  Administered 2015-07-03: 1000 ug via INTRAMUSCULAR

## 2015-07-03 NOTE — Progress Notes (Signed)
Helen Mahoney presents today for injection per MD orders. B12 1000mcg administered IM in right Upper Arm. Administration without incident. Patient tolerated well.  

## 2015-07-03 NOTE — Patient Instructions (Signed)
[place] at [facility] Discharge Instructions  RECOMMENDATIONS MADE BY THE CONSULTANT AND ANY TEST RESULTS WILL BE SENT TO YOUR REFERRING PHYSICIAN.  B12 today.  This is the second weekly injection, you have two more and then you will get them monthly.    Thank you for choosing [place] at [facility] to provide your oncology and hematology care.  To afford each patient quality time with our provider, please arrive at least 15 minutes before your scheduled appointment time.   Beginning January 23rd 2017 lab work for the [facility] will be done in the  Main lab at [facility] on 1st floor. If you have a lab appointment with the [facility] please come in thru the  Main Entrance and check in at the main information desk  You need to re-schedule your appointment should you arrive 10 or more minutes late.  We strive to give you quality time with our providers, and arriving late affects you and other patients whose appointments are after yours.  Also, if you no show three or more times for appointments you may be dismissed from the clinic at the providers discretion.     Again, thank you for choosing [facility].  Our hope is that these requests will decrease the amount of time that you wait before being seen by our physicians.       _____________________________________________________________  Should you have questions after your visit to [facility], please contact our office at [phone] between the hours of 8:30 a.m. and 4:30 p.m.  Voicemails left after 4:30 p.m. will not be returned until the following business day.  For prescription refill requests, have your pharmacy contact our office.         Resources For Cancer Patients and their Caregivers ? American Cancer Society: Can assist with transportation, wigs, general needs, runs Look Good Feel Better.        872-427-3207 ? Cancer Care: Provides  financial assistance, online support groups, medication/co-pay assistance.  1-800-813-HOPE 410-696-7594) ? [facility] Assists [place] cancer patients and their families through emotional , educational and financial support.  ([phone] ? [place] DSS Where to apply for food stamps, Medicaid and utility assistance. 873-778-6958 ? RCATS: Transportation to medical appointments. ([phone] [phone] ? Social Security Administration: May apply for disability if have a Stage IV cancer. [phone] [phone] ? [employer], Disability and Transit Services: Assists with nutrition, care and transit needs. [phone]

## 2015-07-04 ENCOUNTER — Encounter: Payer: Self-pay | Admitting: Family Medicine

## 2015-07-04 ENCOUNTER — Ambulatory Visit (INDEPENDENT_AMBULATORY_CARE_PROVIDER_SITE_OTHER): Payer: Commercial Managed Care - HMO | Admitting: Family Medicine

## 2015-07-04 VITALS — BP 130/80 | HR 92 | Resp 16 | Ht 66.0 in | Wt 252.0 lb

## 2015-07-04 DIAGNOSIS — E559 Vitamin D deficiency, unspecified: Secondary | ICD-10-CM

## 2015-07-04 DIAGNOSIS — Z794 Long term (current) use of insulin: Secondary | ICD-10-CM

## 2015-07-04 DIAGNOSIS — IMO0001 Reserved for inherently not codable concepts without codable children: Secondary | ICD-10-CM

## 2015-07-04 DIAGNOSIS — E119 Type 2 diabetes mellitus without complications: Secondary | ICD-10-CM

## 2015-07-04 DIAGNOSIS — E785 Hyperlipidemia, unspecified: Secondary | ICD-10-CM | POA: Diagnosis not present

## 2015-07-04 DIAGNOSIS — M25561 Pain in right knee: Secondary | ICD-10-CM

## 2015-07-04 DIAGNOSIS — M1711 Unilateral primary osteoarthritis, right knee: Secondary | ICD-10-CM

## 2015-07-04 DIAGNOSIS — I10 Essential (primary) hypertension: Secondary | ICD-10-CM

## 2015-07-04 MED ORDER — INSULIN GLARGINE 100 UNIT/ML SOLOSTAR PEN: PEN_INJECTOR | SUBCUTANEOUS | Status: DC

## 2015-07-04 NOTE — Assessment & Plan Note (Signed)
Deteriorated, needs to take the 25 unit dose of insulin as prescribed Needs to stop overeating candy Helen Mahoney is reminded of the importance of commitment to daily physical activity for 30 minutes or more, as able and the need to limit carbohydrate intake to 30 to 60 grams per meal to help with blood sugar control.   The need to take medication as prescribed, test blood sugar as directed, and to call between visits if there is a concern that blood sugar is uncontrolled is also discussed.   Helen Mahoney is reminded of the importance of daily foot exam, annual eye examination, and good blood sugar, blood pressure and cholesterol control.  Diabetic Labs Latest Ref Rng 06/27/2015 06/15/2015 03/31/2015 10/15/2014 07/08/2014  HbA1c <5.7 % 7.7(H) - 7.6(H) 7.4(H) 7.4(H)  Microalbumin <2.0 mg/dL - - - 1.1 -  Micro/Creat Ratio 0.0 - 30.0 mg/g - - - 8.6 -  Chol 125 - 200 mg/dL - - 148 157 -  HDL >=46 mg/dL - - 44(L) 58 -  Calc LDL <130 mg/dL - - 89 88 -  Triglycerides <150 mg/dL - - 74 57 -  Creatinine 0.50 - 0.99 mg/dL 0.88 0.88 0.84 0.80 0.99   BP/Weight 07/04/2015 07/03/2015 06/26/2015 06/15/2015 05/18/2015 05/15/2015 123456  Systolic BP [id number] Q000111Q 0000000 123456 [id number] 0000000  Diastolic BP 80 75 79 71 88 70 78  Wt. (Lbs) 252 - - 249 250.8 - 250.2  BMI 40.69 - - 40.21 40.5 - 40.4   Foot/eye exam completion dates Latest Ref Rng 04/04/2015 11/09/2014  Eye Exam No Retinopathy - No Retinopathy  Foot Form Completion - Done -       Updated lab needed at/ before next visit.

## 2015-07-04 NOTE — Assessment & Plan Note (Signed)
Controlled, no change in medication DASH diet and commitment to daily physical activity for a minimum of 30 minutes discussed and encouraged, as a part of hypertension management. The importance of attaining a healthy weight is also discussed.  BP/Weight 07/04/2015 07/03/2015 06/26/2015 06/15/2015 05/18/2015 05/15/2015 123456  Systolic BP [id number] Q000111Q 0000000 123456 [id number] 0000000  Diastolic BP 80 75 79 71 88 70 78  Wt. (Lbs) 252 - - 249 250.8 - 250.2  BMI 40.69 - - 40.21 40.5 - 40.4

## 2015-07-04 NOTE — Patient Instructions (Addendum)
Welcome to medicare end June, or first weeek in July  PLEASE CHANGE eating , chocolate is your enemy  Fasting lipid, cmp and EGFR, hBa1C, Vtamin D and TSH end June/ early July   You are referred for bone density test  Lantus  is prescribed at 25 units daily, please use this dose, test 3 times daily, change eating habits  Thanks for choosing [facility], we consider it a privelige to serve you.

## 2015-07-04 NOTE — Assessment & Plan Note (Signed)
Deteriorated. Patient re-educated about  the importance of commitment to a  minimum of 150 minutes of exercise per week.  The importance of healthy food choices with portion control discussed. Encouraged to start a food diary, count calories and to consider  joining a support group. Sample diet sheets offered. Goals set by the patient for the next several months.   Weight /BMI 07/04/2015 06/15/2015 06/02/2015  WEIGHT 252 lb 249 lb -  HEIGHT 5\' 6"  5\' 6"  5\' 6"   BMI 40.69 kg/m2 40.21 kg/m2 -    Current exercise per week 30  minutes.

## 2015-07-04 NOTE — Progress Notes (Signed)
Subjective:    Patient ID: Helen Mahoney, female    DOB: 04-10-1951, 65 y.o.   MRN: [id number]  Helen   [name]     MRN: [id number]      DOB: [birth year]   Helen Helen Mahoney is here for follow up and re-evaluation of chronic medical conditions, medication management and review of any available recent lab and radiology data.  Preventive health is updated, specifically  Cancer screening and Immunization.   Questions or concerns regarding consultations or procedures which the PT has had in the interim are  addressed. The PT denies any adverse reactions to current medications since the last visit.  Denies polyuria, polydipsia, blurred vision , or hypoglycemic episodes. Blood sugar  Varies depending on food eaten Still no exercise commitment. C/o increased pain, swelling and instability of right knee over the past 1 year, wants ortho eval   ROS Denies recent fever or chills. Denies sinus pressure, nasal congestion, ear pain or sore throat. Denies chest congestion, productive cough or wheezing. Denies chest pains, palpitations and leg swelling Denies abdominal pain, nausea, vomiting,diarrhea or constipation.   Denies dysuria, frequency, hesitancy or incontinence.  Denies headaches, seizures, numbness, or tingling. Denies depression, anxiety or insomnia. Denies skin break down or rash.   PE  BP 130/80 mmHg  Pulse 92  Resp 16  Ht 5\' 6"  (1.676 m)  Wt 252 lb (114.306 kg)  BMI 40.69 kg/m2  SpO2 96%  Patient alert and oriented and in no cardiopulmonary distress.  HEENT: No facial asymmetry, EOMI,   oropharynx pink and moist.  Neck supple no JVD, no mass.  Chest: Clear to auscultation bilaterally.  CVS: S1, S2 no murmurs, no S3.Regular rate.  ABD: Soft non tender.   Ext: No edema  MS: Adequate ROM spine, shoulders, hips and reduced ROM right knee with crepitus and swelling  Skin: Intact, no ulcerations or rash noted.  Psych: Good eye contact, normal affect.  Memory intact not anxious or depressed appearing.  CNS: CN 2-12 intact, power,  normal throughout.no focal deficits noted.   Assessment & Plan   Essential hypertension Controlled, no change in medication DASH diet and commitment to daily physical activity for a minimum of 30 minutes discussed and encouraged, as a part of hypertension management. The importance of attaining a healthy weight is also discussed.  BP/Weight 07/04/2015 07/03/2015 06/26/2015 06/15/2015 05/18/2015 05/15/2015 123456  Systolic BP [id number] Q000111Q 0000000 123456 XX123456 0000000  Diastolic BP 80 75 79 71 88 70 78  Wt. (Lbs) 252 - - 249 250.8 - 250.2  BMI 40.69 - - 40.21 40.5 - 40.4        Diabetes mellitus, insulin dependent (IDDM), controlled Deteriorated, needs to take the 25 unit dose of insulin as prescribed Needs to stop overeating candy Helen Mahoney is reminded of the importance of commitment to daily physical activity for 30 minutes or more, as able and the need to limit carbohydrate intake to 30 to 60 grams per meal to help with blood sugar control.   The need to take medication as prescribed, test blood sugar as directed, and to call between visits if there is a concern that blood sugar is uncontrolled is also discussed.   Helen Mahoney is reminded of the importance of daily foot exam, annual eye examination, and good blood sugar, blood pressure and cholesterol control.  Diabetic Labs Latest Ref Rng 06/27/2015 06/15/2015 03/31/2015 10/15/2014 07/08/2014  HbA1c <5.7 % 7.7(H) - 7.6(H) 7.4(H) 7.4(H)  Microalbumin <2.0  mg/dL - - - 1.1 -  Micro/Creat Ratio 0.0 - 30.0 mg/g - - - 8.6 -  Chol 125 - 200 mg/dL - - 148 157 -  HDL >=46 mg/dL - - 44(L) 58 -  Calc LDL <130 mg/dL - - 89 88 -  Triglycerides <150 mg/dL - - 74 57 -  Creatinine 0.50 - 0.99 mg/dL 0.88 0.88 0.84 0.80 0.99   BP/Weight 07/04/2015 07/03/2015 06/26/2015 06/15/2015 05/18/2015 05/15/2015 123456  Systolic BP [id number] Q000111Q 0000000 123456 XX123456 0000000  Diastolic BP 80 75 79 71 88 70 78    Wt. (Lbs) 252 - - 249 250.8 - 250.2  BMI 40.69 - - 40.21 40.5 - 40.4   Foot/eye exam completion dates Latest Ref Rng 04/04/2015 11/09/2014  Eye Exam No Retinopathy - No Retinopathy  Foot Form Completion - Done -       Updated lab needed at/ before next visit.   Osteoarthritis of right knee Worsened symptoms of pain and instability in past 1 year, refer to ortho for further eval  Hyperlipemia Hyperlipidemia:Low fat diet discussed and encouraged. Updated lab needed at/ before next visit.    Lipid Panel  Lab Results  Component Value Date   CHOL 148 03/31/2015   HDL 44* 03/31/2015   LDLCALC 89 03/31/2015   TRIG 74 03/31/2015   CHOLHDL 3.4 03/31/2015        Morbid obesity Deteriorated. Patient re-educated about  the importance of commitment to a  minimum of 150 minutes of exercise per week.  The importance of healthy food choices with portion control discussed. Encouraged to start a food diary, count calories and to consider  joining a support group. Sample diet sheets offered. Goals set by the patient for the next several months.   Weight /BMI 07/04/2015 06/15/2015 06/02/2015  WEIGHT 252 lb 249 lb -  HEIGHT 5\' 6"  5\' 6"  5\' 6"   BMI 40.69 kg/m2 40.21 kg/m2 -    Current exercise per week 30  minutes.       Review of Systems     Objective:   Physical Exam        Assessment & Plan:

## 2015-07-04 NOTE — Assessment & Plan Note (Addendum)
Hyperlipidemia:Low fat diet discussed and encouraged. Updated lab needed at/ before next visit.    Lipid Panel  Lab Results  Component Value Date   CHOL 148 03/31/2015   HDL 44* 03/31/2015   LDLCALC 89 03/31/2015   TRIG 74 03/31/2015   CHOLHDL 3.4 03/31/2015

## 2015-07-04 NOTE — Assessment & Plan Note (Signed)
Worsened symptoms of pain and instability in past 1 year, refer to ortho for further eval

## 2015-07-10 ENCOUNTER — Ambulatory Visit (HOSPITAL_COMMUNITY): Payer: Commercial Managed Care - HMO

## 2015-07-10 ENCOUNTER — Encounter (HOSPITAL_BASED_OUTPATIENT_CLINIC_OR_DEPARTMENT_OTHER): Payer: Commercial Managed Care - HMO

## 2015-07-10 DIAGNOSIS — E538 Deficiency of other specified B group vitamins: Secondary | ICD-10-CM | POA: Diagnosis not present

## 2015-07-10 MED ORDER — CYANOCOBALAMIN 1000 MCG/ML IJ SOLN
1000.0000 ug | INTRAMUSCULAR | Status: DC
  Administered 2015-07-10: 1000 ug via INTRAMUSCULAR
  Filled 2015-07-10: qty 1

## 2015-07-10 NOTE — Patient Instructions (Signed)
[facility] at [facility] Discharge Instructions  RECOMMENDATIONS MADE BY THE CONSULTANT AND ANY TEST RESULTS WILL BE SENT TO YOUR REFERRING PHYSICIAN.  Vitamin B12 1000 mcg injection given as ordered.  Thank you for choosing [facility] at [facility] to provide your oncology and hematology care.  To afford each patient quality time with our provider, please arrive at least 15 minutes before your scheduled appointment time.   Beginning January 23rd 2017 lab work for the Cancer Center will be done in the  Main lab at [facility] on 1st floor. If you have a lab appointment with the Cancer Center please come in thru the  Main Entrance and check in at the main information desk  You need to re-schedule your appointment should you arrive 10 or more minutes late.  We strive to give you quality time with our providers, and arriving late affects you and other patients whose appointments are after yours.  Also, if you no show three or more times for appointments you may be dismissed from the clinic at the providers discretion.     Again, thank you for choosing [facility].  Our hope is that these requests will decrease the amount of time that you wait before being seen by our physicians.       _____________________________________________________________  Should you have questions after your visit to [facility], please contact our office at [phone] between the hours of 8:30 a.m. and 4:30 p.m.  Voicemails left after 4:30 p.m. will not be returned until the following business day.  For prescription refill requests, have your pharmacy contact our office.         Resources For Cancer Patients and their Caregivers ? American Cancer Society: Can assist with transportation, wigs, general needs, runs Look Good Feel Better.        1-888-227-6333 ? Cancer Care: Provides financial assistance, online support groups,  medication/co-pay assistance.  1-800-813-HOPE (4673) ? [facility] Assists [place] cancer patients and their families through emotional , educational and financial support.  336-427-4357 ? [place] DSS Where to apply for food stamps, Medicaid and utility assistance. 336-342-1394 ? RCATS: Transportation to medical appointments. [phone] ? Social Security Administration: May apply for disability if have a Stage IV cancer. [phone] 1-800-772-1213 ? [place] Aging, Disability and Transit Services: Assists with nutrition, care and transit needs. [phone] 

## 2015-07-10 NOTE — Progress Notes (Signed)
Helen Mahoney presents today for injection per MD orders. B12 1000mcg administered IM in right Upper Arm. Administration without incident. Patient tolerated well.  

## 2015-07-14 ENCOUNTER — Encounter (HOSPITAL_COMMUNITY): Payer: Self-pay | Admitting: Hematology & Oncology

## 2015-07-14 ENCOUNTER — Other Ambulatory Visit (HOSPITAL_COMMUNITY): Payer: Self-pay | Admitting: Oncology

## 2015-07-14 ENCOUNTER — Encounter (HOSPITAL_BASED_OUTPATIENT_CLINIC_OR_DEPARTMENT_OTHER): Payer: Commercial Managed Care - HMO | Admitting: Hematology & Oncology

## 2015-07-14 ENCOUNTER — Encounter (HOSPITAL_COMMUNITY): Payer: Commercial Managed Care - HMO

## 2015-07-14 VITALS — BP 115/58 | HR 94 | Temp 98.3°F | Resp 18 | Wt 250.0 lb

## 2015-07-14 DIAGNOSIS — Z794 Long term (current) use of insulin: Secondary | ICD-10-CM | POA: Diagnosis not present

## 2015-07-14 DIAGNOSIS — Z9889 Other specified postprocedural states: Secondary | ICD-10-CM | POA: Diagnosis not present

## 2015-07-14 DIAGNOSIS — E785 Hyperlipidemia, unspecified: Secondary | ICD-10-CM | POA: Diagnosis not present

## 2015-07-14 DIAGNOSIS — Z7982 Long term (current) use of aspirin: Secondary | ICD-10-CM | POA: Diagnosis not present

## 2015-07-14 DIAGNOSIS — R718 Other abnormality of red blood cells: Secondary | ICD-10-CM

## 2015-07-14 DIAGNOSIS — Z7984 Long term (current) use of oral hypoglycemic drugs: Secondary | ICD-10-CM | POA: Diagnosis not present

## 2015-07-14 DIAGNOSIS — E538 Deficiency of other specified B group vitamins: Secondary | ICD-10-CM | POA: Diagnosis not present

## 2015-07-14 DIAGNOSIS — D509 Iron deficiency anemia, unspecified: Secondary | ICD-10-CM

## 2015-07-14 DIAGNOSIS — Z833 Family history of diabetes mellitus: Secondary | ICD-10-CM | POA: Diagnosis not present

## 2015-07-14 DIAGNOSIS — I10 Essential (primary) hypertension: Secondary | ICD-10-CM | POA: Diagnosis not present

## 2015-07-14 DIAGNOSIS — Z823 Family history of stroke: Secondary | ICD-10-CM | POA: Diagnosis not present

## 2015-07-14 DIAGNOSIS — Z86010 Personal history of colonic polyps: Secondary | ICD-10-CM | POA: Diagnosis not present

## 2015-07-14 DIAGNOSIS — Z9049 Acquired absence of other specified parts of digestive tract: Secondary | ICD-10-CM | POA: Diagnosis not present

## 2015-07-14 DIAGNOSIS — Z880 Allergy status to penicillin: Secondary | ICD-10-CM | POA: Diagnosis not present

## 2015-07-14 DIAGNOSIS — R195 Other fecal abnormalities: Secondary | ICD-10-CM

## 2015-07-14 DIAGNOSIS — E119 Type 2 diabetes mellitus without complications: Secondary | ICD-10-CM | POA: Diagnosis not present

## 2015-07-14 DIAGNOSIS — Z803 Family history of malignant neoplasm of breast: Secondary | ICD-10-CM | POA: Diagnosis not present

## 2015-07-14 DIAGNOSIS — Z79899 Other long term (current) drug therapy: Secondary | ICD-10-CM | POA: Diagnosis not present

## 2015-07-14 DIAGNOSIS — E669 Obesity, unspecified: Secondary | ICD-10-CM | POA: Diagnosis not present

## 2015-07-14 DIAGNOSIS — Z808 Family history of malignant neoplasm of other organs or systems: Secondary | ICD-10-CM | POA: Diagnosis not present

## 2015-07-14 DIAGNOSIS — Z888 Allergy status to other drugs, medicaments and biological substances status: Secondary | ICD-10-CM | POA: Diagnosis not present

## 2015-07-14 LAB — CBC WITH DIFFERENTIAL/PLATELET
BASOS PCT: 1 %
Basophils Absolute: 0 10*3/uL (ref 0.0–0.1)
EOS ABS: 0.4 10*3/uL (ref 0.0–0.7)
Eosinophils Relative: 5 %
HCT: 32.9 % — ABNORMAL LOW (ref 36.0–46.0)
HEMOGLOBIN: 10 g/dL — AB (ref 12.0–15.0)
Lymphocytes Relative: 23 %
Lymphs Abs: 1.9 10*3/uL (ref 0.7–4.0)
MCH: 20.8 pg — ABNORMAL LOW (ref 26.0–34.0)
MCHC: 30.4 g/dL (ref 30.0–36.0)
MCV: 68.4 fL — ABNORMAL LOW (ref 78.0–100.0)
MONOS PCT: 6 %
Monocytes Absolute: 0.5 10*3/uL (ref 0.1–1.0)
NEUTROS PCT: 66 %
Neutro Abs: 5.7 10*3/uL (ref 1.7–7.7)
PLATELETS: 328 10*3/uL (ref 150–400)
RBC: 4.81 MIL/uL (ref 3.87–5.11)
RDW: 17 % — AB (ref 11.5–15.5)
WBC: 8.6 10*3/uL (ref 4.0–10.5)

## 2015-07-14 LAB — RETICULOCYTES
RBC.: 4.81 MIL/uL (ref 3.87–5.11)
RETIC COUNT ABSOLUTE: 38.5 10*3/uL (ref 19.0–186.0)
RETIC CT PCT: 0.8 % (ref 0.4–3.1)

## 2015-07-14 LAB — FERRITIN: FERRITIN: 33 ng/mL (ref 11–307)

## 2015-07-14 NOTE — Patient Instructions (Addendum)
[place] at [facility] Discharge Instructions  RECOMMENDATIONS MADE BY THE CONSULTANT AND ANY TEST RESULTS WILL BE SENT TO YOUR REFERRING PHYSICIAN.   Exam and discussion by Dr Whitney Muse today Blood counts are a little better  Your ferritin level is pending today, we will call you with those results  Hemoglobin is inside our blood cells, it carries oxygen to your body  Hemoglobinopathy evaluation, please stop by the lab and they will draw this  Return to see the doctor in 3-4 weeks  Please call the clinic if you have any questions or concerns       Thank you for choosing [place] at [facility] to provide your oncology and hematology care.  To afford each patient quality time with our provider, please arrive at least 15 minutes before your scheduled appointment time.   Beginning January 23rd 2017 lab work for the [facility] will be done in the  Main lab at [facility] on 1st floor. If you have a lab appointment with the [facility] please come in thru the  Main Entrance and check in at the main information desk  You need to re-schedule your appointment should you arrive 10 or more minutes late.  We strive to give you quality time with our providers, and arriving late affects you and other patients whose appointments are after yours.  Also, if you no show three or more times for appointments you may be dismissed from the clinic at the providers discretion.     Again, thank you for choosing [facility].  Our hope is that these requests will decrease the amount of time that you wait before being seen by our physicians.       _____________________________________________________________  Should you have questions after your visit to [facility], please contact our office at [phone] between the hours of 8:30 a.m. and 4:30 p.m.  Voicemails left after 4:30 p.m. will not be returned until the following  business day.  For prescription refill requests, have your pharmacy contact our office.         Resources For Cancer Patients and their Caregivers ? American Cancer Society: Can assist with transportation, wigs, general needs, runs Look Good Feel Better.        754-545-7141 ? Cancer Care: Provides financial assistance, online support groups, medication/co-pay assistance.  1-800-813-HOPE 321-168-4335) ? [facility] Assists [place] cancer patients and their families through emotional , educational and financial support.  [phone] ? [place] DSS Where to apply for food stamps, Medicaid and utility assistance. 281-371-3063 ? [employer]: Transportation to medical appointments. 309-388-1230 ? Social Security Administration: May apply for disability if have a Stage IV cancer. 709-475-2549 [phone] ? [employer], Disability and Transit Services: Assists with nutrition, care and transit needs. ([phone]

## 2015-07-16 NOTE — Progress Notes (Signed)
[facility] NOTE  Patient Care Team: Fayrene Helper, MD as PCP - General Danie Binder, MD as Consulting Physician (Gastroenterology)  CHIEF COMPLAINTS/PURPOSE OF CONSULTATION:  Microcytic Anemia Serum ferritin 38 ng/ml Colonoscopy 05/15/2015 with redundant L colon, diverticulosis, small internal hemorrhoids EGD 05/14/2014 with gastric polyps, GE junction stricture Givens Capsule 06/02/2015 OCCASIONAL EROSION IN DUODENUM No masses, ULCERS, or AVMs SEEN. OCCASIONAL LYMPHANGECTASIA   HISTORY OF PRESENTING ILLNESS:  Helen Mahoney 65 y.o. female is here because of low ferritin levels and anemia.  She presents today to review laboratory studies from her last visit. She is alone today.  She has had a thorough GI evaluation.    Labs were reviewed with the patient. She was advised that her microcytosis is long standing. She continues on ferrous sulfate once daily.   She denies excessive fatigue, nausea, vomiting or abdominal pain. She states she is complaint with her daily iron.  MEDICAL HISTORY:  Past Medical History  Diagnosis Date  . Diabetes mellitus, type 2 ([place])   . Obesity   . Hyperlipidemia   . Hypertension   . Colon polyps     SURGICAL HISTORY: Past Surgical History  Procedure Laterality Date  . Cholecystectomy  2001    ACUTE CHOLECYSTITIS/GALLSTONES  . Bilateral tubal ligation  1979  . Upper gastrointestinal endoscopy  NOV 2008 [facility] ANEMIA    NL EXAM, urease neg  . Colonoscopy w/ polypectomy  NOV 2008 [facility] ANEMIA    POLYP NO RETRIEVED  . Colonoscopy w/ polypectomy  2006 DR. SMTH    POLYP?  Marland Kitchen Colonoscopy  05/14/2011    Dr. Oneida Alar: sessile polyp in sigmoid colon, internal hemorrhoids, hyerplastic polyps  . Esophagogastroduodenoscopy  05/14/2011    Dr. Oneida Alar: sessile polyps in the cardia, mild gastritis. Chronic duodenitis consistent with peptic duodenitis, chronic active H.pylori gastritis.   . Colonoscopy N/A 05/15/2015    Procedure: COLONOSCOPY;  Surgeon: Danie Binder, MD;  [facility];  Service: Endoscopy;  Laterality: N/A;  0830  . Esophagogastroduodenoscopy N/A 05/15/2015    Procedure: ESOPHAGOGASTRODUODENOSCOPY (EGD);  Surgeon: Danie Binder, MD;  Location: [facility];  Service: Endoscopy;  Laterality: N/A;  . Givens capsule study N/A 06/02/2015    Procedure: GIVENS CAPSULE STUDY;  Surgeon: Danie Binder, MD;  Location: [facility];  Service: Endoscopy;  Laterality: N/A;  0700    SOCIAL HISTORY: Social History   Social History  . Marital Status: Married    Spouse Name: N/A  . Number of Children: N/A  . Years of Education: N/A   Occupational History  . Not on file.   Social History Main Topics  . Smoking status: Never Smoker   . Smokeless tobacco: Not on file  . Alcohol Use: No  . Drug Use: No  . Sexual Activity: Not on file   Other Topics Concern  . Not on file   Social History Narrative  Married 46 years 2 children, both boys 4 grandchildren She does not work outside the home She enjoys shopping Non smoker ETOH, none  FAMILY HISTORY: Family History  Problem Relation Age of Onset  . Cancer Mother     breast   . Heart disease Mother   . Diabetes Mother   . Hypertension Mother   . Stroke Mother   . Diabetes Father   . Heart attack Father   . Kidney disease Father   . Hypertension Sister   . Diabetes Sister   .  Diabetes Sister   . Mental illness Sister   . Hypertension Sister   . Mental illness Sister   . Diabetes Sister   . Hypertension Sister   . Mental illness Sister   . Colon cancer Sister     may have been in her 17s when diagnosed  . Diabetes Brother    indicated that her mother is deceased. She indicated that her father is deceased. She indicated that all of her four sisters are alive. She indicated that both of her brothers are deceased.  Mother deceased in her 78s Father deceased in his 66s. One parent died of heart trouble and another of  pneumonia Her sibling died of excessive drinking Her sister had rectal colon cancer, diagnosed 6-7 years ago. She had surgery with Dr. Laural Golden. No chemotherapy or radiation treatment.  ALLERGIES:  is allergic to tetanus toxoids and penicillins.  MEDICATIONS:  Current Outpatient Prescriptions  Medication Sig Dispense Refill  . aspirin 81 MG tablet Take 81 mg by mouth daily.    . Calcium Citrate-Vitamin D (CITRACAL PETITES/VITAMIN D) 200-250 MG-UNIT TABS Take 1 tablet by mouth daily.     . ergocalciferol (VITAMIN D2) 50000 units capsule Take 1 capsule (50,000 Units total) by mouth once a week. One capsule once weekly 12 capsule 1  . Ferrous Sulfate (IRON) 325 (65 FE) MG TABS Take 1 tablet by mouth daily. 100 each 2  . glipiZIDE (GLUCOTROL) 10 MG tablet Take 1 tablet (10 mg total) by mouth 2 (two) times daily before a meal. 180 tablet 1  . Insulin Glargine (LANTUS SOLOSTAR) 100 UNIT/ML Solostar Pen INJECT 25 UNITS SUBCUTANEOUSLY ONCE DAILY 10:00  PM 15 mL 2  . lisinopril (PRINIVIL,ZESTRIL) 20 MG tablet Take 1 tablet (20 mg total) by mouth daily. 90 tablet 1  . metFORMIN (GLUCOPHAGE) 1000 MG tablet Take 1 tablet (1,000 mg total) by mouth 2 (two) times daily with a meal. 180 tablet 1  . Multiple Vitamin (MULTIVITAMINS PO) Take 1 tablet by mouth daily.     . pravastatin (PRAVACHOL) 80 MG tablet TAKE ONE TABLET BY MOUTH ONCE DAILY IN THE EVENING 90 tablet 1   No current facility-administered medications for this visit.    Review of Systems  Constitutional: Negative.  Negative for malaise/fatigue.  HENT: Negative.   Eyes: Negative.   Respiratory: Negative.  Negative for shortness of breath and wheezing.   Cardiovascular: Negative.  Negative for chest pain.  Gastrointestinal: Negative.  Negative for abdominal pain, blood in stool and melena.       Abnormal colored stool associated with oral iron intake.  Genitourinary: Negative.   Musculoskeletal: Positive for joint pain.       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  Skin: Negative.   Neurological: Negative.  Negative for weakness.  Endo/Heme/Allergies: Negative.   Psychiatric/Behavioral: Negative.   All other systems reviewed and are negative.  14 point ROS was done and is otherwise as detailed above or in HPI   PHYSICAL EXAMINATION: ECOG PERFORMANCE STATUS: 0 - Asymptomatic  Filed Vitals:   07/14/15 1100  BP: 115/58  Pulse: 94  Temp: 98.3 F (36.8 C)  Resp: 18   Filed Weights   07/14/15 1100  Weight: 250 lb (113.399 kg)    Physical Exam  Constitutional: She is oriented to person, place, and time and well-developed, well-nourished, and in no distress.  HENT:  Head: Normocephalic and atraumatic.  Nose: Nose normal.  Mouth/Throat: Oropharynx is clear and moist. No oropharyngeal exudate.  Eyes: Conjunctivae  and EOM are normal. Pupils are equal, round, and reactive to light. Right eye exhibits no discharge. Left eye exhibits no discharge. No scleral icterus.  Neck: Normal range of motion. Neck supple. No tracheal deviation present. No thyromegaly present.  Cardiovascular: Normal rate, regular rhythm and normal heart sounds.  Exam reveals no gallop and no friction rub.   No murmur heard. Pulmonary/Chest: Effort normal and breath sounds normal. She has no wheezes. She has no rales.  Abdominal: Soft. Bowel sounds are normal. She exhibits no distension and no mass. There is no tenderness. There is no rebound and no guarding.  Musculoskeletal: Normal range of motion. She exhibits no edema.  Lymphadenopathy:    She has no cervical adenopathy.  Neurological: She is alert and oriented to person, place, and time. She has normal reflexes. No cranial nerve deficit. Gait normal. Coordination normal.  Skin: Skin is warm and dry. No rash noted.  Psychiatric: Mood, memory, affect and judgment normal.  Nursing note and vitals reviewed.   LABORATORY DATA:  I have reviewed the data as listed Lab Results  Component Value Date   WBC  8.6 07/14/2015   HGB 10.0* 07/14/2015   HCT 32.9* 07/14/2015   MCV 68.4* 07/14/2015   PLT 328 07/14/2015   CMP     Component Value Date/Time   NA 139 06/27/2015 0957   K 4.3 06/27/2015 0957   CL 105 06/27/2015 0957   CO2 26 06/27/2015 0957   GLUCOSE 86 06/27/2015 0957   BUN 13 06/27/2015 0957   CREATININE 0.88 06/27/2015 0957   CREATININE 0.88 06/15/2015 1510   CALCIUM 8.9 06/27/2015 0957   PROT 7.3 06/15/2015 1510   ALBUMIN 3.4* 06/15/2015 1510   AST 21 06/15/2015 1510   ALT 20 06/15/2015 1510   ALKPHOS 107 06/15/2015 1510   BILITOT 0.3 06/15/2015 1510   GFRNONAA 69 06/27/2015 0957   GFRNONAA >60 06/15/2015 1510   GFRAA 80 06/27/2015 0957   GFRAA >60 06/15/2015 1510   Results for Helen Mahoney, Helen Mahoney (MRN [id number])  Ref. Range 09/21/2008 21:20 10/16/2010 14:20 05/01/2011 10:50 09/08/2012 10:00 10/02/2013 09:02 10/15/2014 08:31 03/31/2015 09:09 06/15/2015 15:10  Hemoglobin Latest Ref Range: 12.0-15.0 g/dL 10.5 (L) 10.4 (L) 10.2 (L) 9.7 (L) 10.1 (L) 9.6 (L) 9.8 (L) 9.7 (L)   Results for Helen Mahoney, Helen Mahoney (MRN [id number])   Ref. Range 09/21/2008 21:20 10/16/2010 14:20 05/01/2011 10:50 09/08/2012 10:00 10/02/2013 09:02 10/15/2014 08:31 03/31/2015 09:09 06/15/2015 15:10  MCV Latest Ref Range: 78.0-100.0 fL 70.9 (L) 68.7 (L) 69.1 (L) 67.6 (L) 65.3 (L) 68.8 (L) 68.2 (L) 68.4 (L)   Results for Helen Mahoney, Helen Mahoney (MRN [id number]) as of 07/16/2015 16:53  Ref. Range 05/01/2011 10:50 09/08/2012 10:00 10/15/2014 08:31 03/31/2015 09:09 07/14/2015 09:53  Ferritin Latest Ref Range: 11-307 ng/mL 40 27 31 38 33     PATHOLOGY   RADIOGRAPHIC STUDIES: I have personally reviewed the radiological images as listed and agreed with the findings in the report. No results found.     ASSESSMENT & PLAN:  Microcytic Anemia longstanding dating back to 2010 Serum ferritin 38 ng/ml Colonoscopy 05/15/2015 with redundant L colon, diverticulosis, small internal hemorrhoids EGD 05/14/2014 with gastric polyps, GE  junction stricture Givens Capsule 06/02/2015 OCCASIONAL EROSION IN DUODENUM No masses, ULCERS, or AVMs SEEN. OCCASIONAL LYMPHANGECTASIA B12 deficiency  The patient had a colonoscopy, EGD, and pill cam with Dr. Oneida Alar on 06/02/2015. She has iron deficiency. If serum iron studies today remain persistently low I would change her oral iron formulation and  strongly consider IV iron replacement.   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 She has agreed to a hemoglobinopathy panel.  Mrs. Hardinger will undergo additional labs today including:  Orders Placed This Encounter  Procedures  . Hemoglobinopathy evaluation    Standing Status: Future     Number of Occurrences: 1     Standing Expiration Date: 07/13/2016    She is to continue on her B12 as prescribed.  At some point we can consider SL B12, if the patient would remain compliant. She will return in 4 weeks for follow up with CBC and ferritin level check.   All questions were answered. The patient knows to call the clinic with any problems, questions or concerns.  This note was electronically signed.   Molli Hazard  07/16/2015 4:47 PM

## 2015-07-17 ENCOUNTER — Ambulatory Visit (HOSPITAL_COMMUNITY): Payer: Commercial Managed Care - HMO

## 2015-07-17 LAB — HEMOGLOBINOPATHY EVALUATION
HGB A: 97.9 % (ref 94.0–98.0)
HGB C: 0 %
HGB S QUANTITAION: 0 %
Hgb A2 Quant: 2.1 % (ref 0.7–3.1)
Hgb F Quant: 0 % (ref 0.0–2.0)

## 2015-07-26 ENCOUNTER — Other Ambulatory Visit (HOSPITAL_COMMUNITY): Payer: Self-pay | Admitting: Oncology

## 2015-07-26 DIAGNOSIS — D509 Iron deficiency anemia, unspecified: Secondary | ICD-10-CM

## 2015-08-04 ENCOUNTER — Encounter (HOSPITAL_COMMUNITY): Payer: Commercial Managed Care - HMO | Attending: Hematology & Oncology

## 2015-08-04 VITALS — BP 113/64 | HR 85 | Temp 97.8°F | Resp 16

## 2015-08-04 DIAGNOSIS — E669 Obesity, unspecified: Secondary | ICD-10-CM | POA: Insufficient documentation

## 2015-08-04 DIAGNOSIS — Z833 Family history of diabetes mellitus: Secondary | ICD-10-CM | POA: Insufficient documentation

## 2015-08-04 DIAGNOSIS — Z803 Family history of malignant neoplasm of breast: Secondary | ICD-10-CM | POA: Insufficient documentation

## 2015-08-04 DIAGNOSIS — Z823 Family history of stroke: Secondary | ICD-10-CM | POA: Insufficient documentation

## 2015-08-04 DIAGNOSIS — Z808 Family history of malignant neoplasm of other organs or systems: Secondary | ICD-10-CM | POA: Insufficient documentation

## 2015-08-04 DIAGNOSIS — Z86010 Personal history of colonic polyps: Secondary | ICD-10-CM | POA: Insufficient documentation

## 2015-08-04 DIAGNOSIS — I10 Essential (primary) hypertension: Secondary | ICD-10-CM | POA: Insufficient documentation

## 2015-08-04 DIAGNOSIS — E539 Vitamin B deficiency, unspecified: Secondary | ICD-10-CM | POA: Diagnosis not present

## 2015-08-04 DIAGNOSIS — Z9049 Acquired absence of other specified parts of digestive tract: Secondary | ICD-10-CM | POA: Insufficient documentation

## 2015-08-04 DIAGNOSIS — E538 Deficiency of other specified B group vitamins: Secondary | ICD-10-CM | POA: Insufficient documentation

## 2015-08-04 DIAGNOSIS — Z880 Allergy status to penicillin: Secondary | ICD-10-CM | POA: Insufficient documentation

## 2015-08-04 DIAGNOSIS — Z794 Long term (current) use of insulin: Secondary | ICD-10-CM | POA: Insufficient documentation

## 2015-08-04 DIAGNOSIS — E119 Type 2 diabetes mellitus without complications: Secondary | ICD-10-CM | POA: Insufficient documentation

## 2015-08-04 DIAGNOSIS — Z9889 Other specified postprocedural states: Secondary | ICD-10-CM | POA: Insufficient documentation

## 2015-08-04 DIAGNOSIS — D509 Iron deficiency anemia, unspecified: Secondary | ICD-10-CM | POA: Diagnosis not present

## 2015-08-04 DIAGNOSIS — Z888 Allergy status to other drugs, medicaments and biological substances status: Secondary | ICD-10-CM | POA: Insufficient documentation

## 2015-08-04 DIAGNOSIS — Z79899 Other long term (current) drug therapy: Secondary | ICD-10-CM | POA: Insufficient documentation

## 2015-08-04 DIAGNOSIS — Z7984 Long term (current) use of oral hypoglycemic drugs: Secondary | ICD-10-CM | POA: Insufficient documentation

## 2015-08-04 DIAGNOSIS — Z7982 Long term (current) use of aspirin: Secondary | ICD-10-CM | POA: Insufficient documentation

## 2015-08-04 DIAGNOSIS — E785 Hyperlipidemia, unspecified: Secondary | ICD-10-CM | POA: Insufficient documentation

## 2015-08-04 MED ORDER — CYANOCOBALAMIN 1000 MCG/ML IJ SOLN
1000.0000 ug | INTRAMUSCULAR | Status: DC
  Administered 2015-08-04: 1000 ug via INTRAMUSCULAR

## 2015-08-04 MED ORDER — SODIUM CHLORIDE 0.9 % IV SOLN
510.0000 mg | Freq: Once | INTRAVENOUS | Status: AC
  Administered 2015-08-04: 510 mg via INTRAVENOUS
  Filled 2015-08-04: qty 17

## 2015-08-04 MED ORDER — SODIUM CHLORIDE 0.9 % IV SOLN
INTRAVENOUS | Status: DC
  Administered 2015-08-04: 14:00:00 via INTRAVENOUS

## 2015-08-04 NOTE — Progress Notes (Signed)
Patient tolerated infusion well.  VSS.   

## 2015-08-04 NOTE — Patient Instructions (Signed)
[place] at [facility] Discharge Instructions  RECOMMENDATIONS MADE BY THE CONSULTANT AND ANY TEST RESULTS WILL BE SENT TO YOUR REFERRING PHYSICIAN.  B12 today and Iron infusion.    Thank you for choosing [place] at [facility] to provide your oncology and hematology care.  To afford each patient quality time with our provider, please arrive at least 15 minutes before your scheduled appointment time.   Beginning January 23rd 2017 lab work for the [facility] will be done in the  Main lab at [facility] on 1st floor. If you have a lab appointment with the [facility] please come in thru the  Main Entrance and check in at the main information desk  You need to re-schedule your appointment should you arrive 10 or more minutes late.  We strive to give you quality time with our providers, and arriving late affects you and other patients whose appointments are after yours.  Also, if you no show three or more times for appointments you may be dismissed from the clinic at the providers discretion.     Again, thank you for choosing [facility].  Our hope is that these requests will decrease the amount of time that you wait before being seen by our physicians.       _____________________________________________________________  Should you have questions after your visit to [facility], please contact our office at [phone] between the hours of 8:30 a.m. and 4:30 p.m.  Voicemails left after 4:30 p.m. will not be returned until the following business day.  For prescription refill requests, have your pharmacy contact our office.         Resources For Cancer Patients and their Caregivers ? American Cancer Society: Can assist with transportation, wigs, general needs, runs Look Good Feel Better.        (409)700-1380 ? Cancer Care: Provides financial assistance, online support groups, medication/co-pay assistance.   1-800-813-HOPE 978-185-1535) ? [facility] Assists [place] cancer patients and their families through emotional , educational and financial support.  623 190 0261 ? [place] DSS Where to apply for food stamps, Medicaid and utility assistance. (314)184-3897 ? [employer]ATS: Transportation to medical appointments. 463-450-9725 ? Social Security Administration: May apply for disability if have a Stage IV cancer. ([phone] 970-786-0689 ? [employer], Disability and Transit Services: Assists with nutrition, care and transit needs. [phone]

## 2015-08-10 ENCOUNTER — Encounter: Payer: Self-pay | Admitting: Gastroenterology

## 2015-08-14 ENCOUNTER — Ambulatory Visit: Payer: BLUE CROSS/BLUE SHIELD | Admitting: Gastroenterology

## 2015-08-15 ENCOUNTER — Ambulatory Visit: Payer: BLUE CROSS/BLUE SHIELD | Admitting: Nurse Practitioner

## 2015-08-17 ENCOUNTER — Encounter (HOSPITAL_BASED_OUTPATIENT_CLINIC_OR_DEPARTMENT_OTHER): Payer: Commercial Managed Care - HMO

## 2015-08-17 ENCOUNTER — Encounter (HOSPITAL_BASED_OUTPATIENT_CLINIC_OR_DEPARTMENT_OTHER): Payer: Commercial Managed Care - HMO | Admitting: Hematology & Oncology

## 2015-08-17 ENCOUNTER — Encounter (HOSPITAL_COMMUNITY): Payer: Self-pay | Admitting: Hematology & Oncology

## 2015-08-17 ENCOUNTER — Encounter (HOSPITAL_COMMUNITY): Payer: Commercial Managed Care - HMO

## 2015-08-17 ENCOUNTER — Ambulatory Visit (HOSPITAL_COMMUNITY): Payer: Commercial Managed Care - HMO

## 2015-08-17 DIAGNOSIS — I10 Essential (primary) hypertension: Secondary | ICD-10-CM | POA: Diagnosis not present

## 2015-08-17 DIAGNOSIS — E538 Deficiency of other specified B group vitamins: Secondary | ICD-10-CM | POA: Diagnosis not present

## 2015-08-17 DIAGNOSIS — R718 Other abnormality of red blood cells: Secondary | ICD-10-CM | POA: Diagnosis not present

## 2015-08-17 DIAGNOSIS — Z833 Family history of diabetes mellitus: Secondary | ICD-10-CM | POA: Diagnosis not present

## 2015-08-17 DIAGNOSIS — Z794 Long term (current) use of insulin: Secondary | ICD-10-CM | POA: Diagnosis not present

## 2015-08-17 DIAGNOSIS — Z880 Allergy status to penicillin: Secondary | ICD-10-CM | POA: Diagnosis not present

## 2015-08-17 DIAGNOSIS — Z823 Family history of stroke: Secondary | ICD-10-CM | POA: Diagnosis not present

## 2015-08-17 DIAGNOSIS — Z9049 Acquired absence of other specified parts of digestive tract: Secondary | ICD-10-CM | POA: Diagnosis not present

## 2015-08-17 DIAGNOSIS — D509 Iron deficiency anemia, unspecified: Secondary | ICD-10-CM

## 2015-08-17 DIAGNOSIS — Z803 Family history of malignant neoplasm of breast: Secondary | ICD-10-CM | POA: Diagnosis not present

## 2015-08-17 DIAGNOSIS — Z7982 Long term (current) use of aspirin: Secondary | ICD-10-CM | POA: Diagnosis not present

## 2015-08-17 DIAGNOSIS — Z86010 Personal history of colonic polyps: Secondary | ICD-10-CM | POA: Diagnosis not present

## 2015-08-17 DIAGNOSIS — E785 Hyperlipidemia, unspecified: Secondary | ICD-10-CM | POA: Diagnosis not present

## 2015-08-17 DIAGNOSIS — Z808 Family history of malignant neoplasm of other organs or systems: Secondary | ICD-10-CM | POA: Diagnosis not present

## 2015-08-17 DIAGNOSIS — Z888 Allergy status to other drugs, medicaments and biological substances status: Secondary | ICD-10-CM | POA: Diagnosis not present

## 2015-08-17 DIAGNOSIS — E119 Type 2 diabetes mellitus without complications: Secondary | ICD-10-CM | POA: Diagnosis not present

## 2015-08-17 DIAGNOSIS — E669 Obesity, unspecified: Secondary | ICD-10-CM | POA: Diagnosis not present

## 2015-08-17 DIAGNOSIS — Z79899 Other long term (current) drug therapy: Secondary | ICD-10-CM | POA: Diagnosis not present

## 2015-08-17 DIAGNOSIS — Z9889 Other specified postprocedural states: Secondary | ICD-10-CM | POA: Diagnosis not present

## 2015-08-17 DIAGNOSIS — Z7984 Long term (current) use of oral hypoglycemic drugs: Secondary | ICD-10-CM | POA: Diagnosis not present

## 2015-08-17 MED ORDER — CYANOCOBALAMIN 1000 MCG/ML IJ SOLN
1000.0000 ug | Freq: Once | INTRAMUSCULAR | Status: AC
  Administered 2015-08-17: 1000 ug via INTRAMUSCULAR

## 2015-08-17 MED ORDER — CYANOCOBALAMIN 1000 MCG/ML IJ SOLN
INTRAMUSCULAR | Status: AC
  Filled 2015-08-17: qty 1

## 2015-08-17 NOTE — Progress Notes (Signed)
Helen Mahoney presents today for injection per MD orders. B12 1000 mcg administered IM in left Upper Arm. Administration without incident. Patient tolerated well.  

## 2015-08-17 NOTE — Patient Instructions (Signed)
[facility] at [facility] Discharge Instructions  RECOMMENDATIONS MADE BY THE CONSULTANT AND ANY TEST RESULTS WILL BE SENT TO YOUR REFERRING PHYSICIAN.  B12 injection today.    Thank you for choosing [facility] at [facility] to provide your oncology and hematology care.  To afford each patient quality time with our provider, please arrive at least 15 minutes before your scheduled appointment time.   Beginning January 23rd 2017 lab work for the Cancer Center will be done in the  Main lab at [place] on 1st floor. If you have a lab appointment with the Cancer Center please come in thru the  Main Entrance and check in at the main information desk  You need to re-schedule your appointment should you arrive 10 or more minutes late.  We strive to give you quality time with our providers, and arriving late affects you and other patients whose appointments are after yours.  Also, if you no show three or more times for appointments you may be dismissed from the clinic at the providers discretion.     Again, thank you for choosing [facility].  Our hope is that these requests will decrease the amount of time that you wait before being seen by our physicians.       _____________________________________________________________  Should you have questions after your visit to [facility], please contact our office at [phone] between the hours of 8:30 a.m. and 4:30 p.m.  Voicemails left after 4:30 p.m. will not be returned until the following business day.  For prescription refill requests, have your pharmacy contact our office.         Resources For Cancer Patients and their Caregivers ? American Cancer Society: Can assist with transportation, wigs, general needs, runs Look Good Feel Better.        1-888-227-6333 ? Cancer Care: Provides financial assistance, online support groups, medication/co-pay assistance.   1-800-813-HOPE (4673) ? [facility] Assists [place] cancer patients and their families through emotional , educational and financial support.  336-427-4357 ? [place] DSS Where to apply for food stamps, Medicaid and utility assistance. 336-342-1394 ? RCATS: Transportation to medical appointments. 336-347-2287 ? Social Security Administration: May apply for disability if have a Stage IV cancer. 336-342-7796 1-800-772-1213 ? [place] [facility], Disability and Transit Services: Assists with nutrition, care and transit needs. [phone]  

## 2015-08-17 NOTE — Patient Instructions (Addendum)
[place] at [facility] Discharge Instructions  RECOMMENDATIONS MADE BY THE CONSULTANT AND ANY TEST RESULTS WILL BE SENT TO YOUR REFERRING PHYSICIAN.    Exam and discussion by Dr Whitney Muse today Labs today Return to see the doctor in 2 months with lab work Please call the clinic if you have any questions or concerns    Thank you for choosing [place] at [facility] to provide your oncology and hematology care.  To afford each patient quality time with our provider, please arrive at least 15 minutes before your scheduled appointment time.   Beginning January 23rd 2017 lab work for the [facility] will be done in the  Main lab at [facility] on 1st floor. If you have a lab appointment with the [facility] please come in thru the  Main Entrance and check in at the main information desk  You need to re-schedule your appointment should you arrive 10 or more minutes late.  We strive to give you quality time with our providers, and arriving late affects you and other patients whose appointments are after yours.  Also, if you no show three or more times for appointments you may be dismissed from the clinic at the providers discretion.     Again, thank you for choosing [facility].  Our hope is that these requests will decrease the amount of time that you wait before being seen by our physicians.       _____________________________________________________________  Should you have questions after your visit to [facility], please contact our office at [phone] between the hours of 8:30 a.m. and 4:30 p.m.  Voicemails left after 4:30 p.m. will not be returned until the following business day.  For prescription refill requests, have your pharmacy contact our office.         Resources For Cancer Patients and their Caregivers ? American Cancer Society: Can assist with transportation, wigs, general needs, runs Look  Good Feel Better.        (475)303-3944 ? Cancer Care: Provides financial assistance, online support groups, medication/co-pay assistance.  1-800-813-HOPE 782 389 2723) ? [facility] Assists [place] cancer patients and their families through emotional , educational and financial support.  513-281-3898 ? [place] DSS Where to apply for food stamps, Medicaid and utility assistance. (302) 437-6931 ? [employer]ATS: Transportation to medical appointments. 862-454-5685 ? Social Security Administration: May apply for disability if have a Stage IV cancer. 860-400-1440 914-670-5907 ? [employer], Disability and Transit Services: Assists with nutrition, care and transit needs. [phone]

## 2015-08-17 NOTE — Progress Notes (Signed)
[facility] Note  Patient Care Team: Fayrene Helper, MD as PCP - General Danie Binder, MD as Consulting Physician (Gastroenterology)  CHIEF COMPLAINTS/PURPOSE OF CONSULTATION:  Microcytic Anemia Serum ferritin 38 ng/ml Colonoscopy 05/15/2015 with redundant L colon, diverticulosis, small internal hemorrhoids EGD 05/14/2014 with gastric polyps, GE junction stricture Givens Capsule 06/02/2015 OCCASIONAL EROSION IN DUODENUM No masses, ULCERS, or AVMs SEEN. OCCASIONAL LYMPHANGECTASIA IV Feraheme 08/04/2015 Normal Hemoglobinopathy panel Low B12 at 170 pg/ml on B12 replacement  HISTORY OF PRESENTING ILLNESS:  Helen Mahoney 65 y.o. female is here for follow-up of microcytic anemia. Serum ferritin was low at 38 and she has received one dose of feraheme. She has had a thorough GI evaluation.    MCV is felt to be out of proportion to her anemia and she is here today to review hemoglobinopathy panel and for additional recommendations. She presents today to review laboratory studies from her last visit. Helen Mahoney was here with a family member today.  She said that she feels the same from last time.   She denies any hematochezia.  She was given B12 today.  MEDICAL HISTORY:  Past Medical History  Diagnosis Date  . Diabetes mellitus, type 2 ([place])   . Obesity   . Hyperlipidemia   . Hypertension   . Colon polyps     SURGICAL HISTORY: Past Surgical History  Procedure Laterality Date  . Cholecystectomy  2001    ACUTE CHOLECYSTITIS/GALLSTONES  . Bilateral tubal ligation  1979  . Upper gastrointestinal endoscopy  NOV 2008 [facility] ANEMIA    NL EXAM, urease neg  . Colonoscopy w/ polypectomy  NOV 2008 [facility] ANEMIA    POLYP NO RETRIEVED  . Colonoscopy w/ polypectomy  2006 DR. SMTH    POLYP?  Marland Kitchen Colonoscopy  05/14/2011    Dr. Oneida Alar: sessile polyp in sigmoid colon, internal hemorrhoids, hyerplastic polyps  . Esophagogastroduodenoscopy  05/14/2011    Dr. Oneida Alar: sessile polyps in the cardia, mild gastritis. Chronic duodenitis consistent with peptic duodenitis, chronic active H.pylori gastritis.   . Colonoscopy N/A 05/15/2015    Procedure: COLONOSCOPY;  Surgeon: Danie Binder, MD;  [facility];  Service: Endoscopy;  Laterality: N/A;  0830  . Esophagogastroduodenoscopy N/A 05/15/2015    Procedure: ESOPHAGOGASTRODUODENOSCOPY (EGD);  Surgeon: Danie Binder, MD;  [facility];  Service: Endoscopy;  Laterality: N/A;  . Givens capsule study N/A 06/02/2015    Procedure: GIVENS CAPSULE STUDY;  Surgeon: Danie Binder, MD;  [facility];  Service: Endoscopy;  Laterality: N/A;  0700    SOCIAL HISTORY: Social History   Social History  . Marital Status: Married    Spouse Name: N/A  . Number of Children: N/A  . Years of Education: N/A   Occupational History  . Not on file.   Social History Main Topics  . Smoking status: Never Smoker   . Smokeless tobacco: Not on file  . Alcohol Use: No  . Drug Use: No  . Sexual Activity: Not on file   Other Topics Concern  . Not on file   Social History Narrative  Married 68 years 2 children, both boys 4 grandchildren She does not work outside the home She enjoys shopping Non smoker ETOH, none  FAMILY HISTORY: Family History  Problem Relation Age of Onset  . Cancer Mother     breast   . Heart disease Mother   . Diabetes Mother   . Hypertension  Mother   . Stroke Mother   . Diabetes Father   . Heart attack Father   . Kidney disease Father   . Hypertension Sister   . Diabetes Sister   . Diabetes Sister   . Mental illness Sister   . Hypertension Sister   . Mental illness Sister   . Diabetes Sister   . Hypertension Sister   . Mental illness Sister   . Colon cancer Sister     may have been in her 86s when diagnosed  . Diabetes Brother    indicated that her mother is deceased. She indicated that her father is deceased. She indicated that all of her four  sisters are alive. She indicated that both of her brothers are deceased.  Mother deceased in her 40s Father deceased in his 67s. One parent died of heart trouble and another of pneumonia Her sibling died of excessive drinking Her sister had rectal colon cancer, diagnosed 6-7 years ago. She had surgery with Dr. Laural Golden. No chemotherapy or radiation treatment.  ALLERGIES:  is allergic to tetanus toxoids and penicillins.  MEDICATIONS:  Current Outpatient Prescriptions  Medication Sig Dispense Refill  . aspirin 81 MG tablet Take 81 mg by mouth daily.    . Calcium Citrate-Vitamin D (CITRACAL PETITES/VITAMIN D) 200-250 MG-UNIT TABS Take 1 tablet by mouth daily.     . ergocalciferol (VITAMIN D2) 50000 units capsule Take 1 capsule (50,000 Units total) by mouth once a week. One capsule once weekly 12 capsule 1  . Ferrous Sulfate (IRON) 325 (65 FE) MG TABS Take 1 tablet by mouth daily. 100 each 2  . glipiZIDE (GLUCOTROL) 10 MG tablet Take 1 tablet (10 mg total) by mouth 2 (two) times daily before a meal. 180 tablet 1  . Insulin Glargine (LANTUS SOLOSTAR) 100 UNIT/ML Solostar Pen INJECT 25 UNITS SUBCUTANEOUSLY ONCE DAILY 10:00  PM 15 mL 2  . lisinopril (PRINIVIL,ZESTRIL) 20 MG tablet Take 1 tablet (20 mg total) by mouth daily. 90 tablet 1  . metFORMIN (GLUCOPHAGE) 1000 MG tablet Take 1 tablet (1,000 mg total) by mouth 2 (two) times daily with a meal. 180 tablet 1  . Multiple Vitamin (MULTIVITAMINS PO) Take 1 tablet by mouth daily.     . pravastatin (PRAVACHOL) 80 MG tablet TAKE ONE TABLET BY MOUTH ONCE DAILY IN THE EVENING 90 tablet 1   No current facility-administered medications for this visit.    Review of Systems  Constitutional: Negative.  Negative for malaise/fatigue.  HENT: Negative.   Eyes: Negative.   Respiratory: Negative.  Negative for shortness of breath and wheezing.   Cardiovascular: Negative.  Negative for chest pain.  Gastrointestinal: Negative.  Negative for abdominal pain, blood  in stool and melena.       Abnormal colored stool associated with oral iron intake.  Genitourinary: Negative.   Musculoskeletal: Positive for joint pain.       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  Skin: Negative.   Neurological: Negative.  Negative for weakness.  Endo/Heme/Allergies: Negative.   Psychiatric/Behavioral: Negative.   All other systems reviewed and are negative.  14 point ROS was done and is otherwise as detailed above or in HPI   PHYSICAL EXAMINATION: ECOG PERFORMANCE STATUS: 0 - Asymptomatic  There were no vitals filed for this visit. There were no vitals filed for this visit.  Physical Exam  Constitutional: She is oriented to person, place, and time and well-developed, well-nourished, and in no distress.  Wears glasses  HENT:  Head: Normocephalic and  atraumatic.  Nose: Nose normal.  Mouth/Throat: Oropharynx is clear and moist. No oropharyngeal exudate.  Eyes: Conjunctivae and EOM are normal. Pupils are equal, round, and reactive to light. Right eye exhibits no discharge. Left eye exhibits no discharge. No scleral icterus.  Neck: Normal range of motion. Neck supple. No tracheal deviation present. No thyromegaly present.  Cardiovascular: Normal rate, regular rhythm and normal heart sounds.  Exam reveals no gallop and no friction rub.   No murmur heard. Pulmonary/Chest: Effort normal and breath sounds normal. She has no wheezes. She has no rales.  Abdominal: Soft. Bowel sounds are normal. She exhibits no distension and no mass. There is no tenderness. There is no rebound and no guarding.  Musculoskeletal: Normal range of motion. She exhibits no edema.  Lymphadenopathy:    She has no cervical adenopathy.  Neurological: She is alert and oriented to person, place, and time. She has normal reflexes. No cranial nerve deficit. Gait normal. Coordination normal.  Skin: Skin is warm and dry. No rash noted.  Psychiatric: Mood, memory, affect and judgment normal.  Nursing note and  vitals reviewed.   LABORATORY DATA:  I have reviewed the data as listed    Results for KYLAYA, VARIO (MRN [id number])  Ref. Range 09/21/2008 21:20 10/16/2010 14:20 05/01/2011 10:50 09/08/2012 10:00 10/02/2013 09:02 10/15/2014 08:31 03/31/2015 09:09 06/15/2015 15:10  Hemoglobin Latest Ref Range: 12.0-15.0 g/dL 10.5 (L) 10.4 (L) 10.2 (L) 9.7 (L) 10.1 (L) 9.6 (L) 9.8 (L) 9.7 (L)   Results for DENNYS, FRISINGER (MRN [id number])   Ref. Range 09/21/2008 21:20 10/16/2010 14:20 05/01/2011 10:50 09/08/2012 10:00 10/02/2013 09:02 10/15/2014 08:31 03/31/2015 09:09 06/15/2015 15:10  MCV Latest Ref Range: 78.0-100.0 fL 70.9 (L) 68.7 (L) 69.1 (L) 67.6 (L) 65.3 (L) 68.8 (L) 68.2 (L) 68.4 (L)   Results for NOELLIE, HANDRAHAN (MRN [id number]) as of 07/16/2015 16:53  Ref. Range 05/01/2011 10:50 09/08/2012 10:00 10/15/2014 08:31 03/31/2015 09:09 07/14/2015 09:53  Ferritin Latest Ref Range: 11-307 ng/mL 40 27 31 38 33     PATHOLOGY   RADIOGRAPHIC STUDIES: I have personally reviewed the radiological images as listed and agreed with the findings in the report. No results found.     ASSESSMENT & PLAN:  Microcytic Anemia longstanding dating back to 2010 Serum ferritin 38 ng/ml Colonoscopy 05/15/2015 with redundant L colon, diverticulosis, small internal hemorrhoids EGD 05/14/2014 with gastric polyps, GE junction stricture Givens Capsule 06/02/2015 OCCASIONAL EROSION IN DUODENUM No masses, ULCERS, or AVMs SEEN. OCCASIONAL LYMPHANGECTASIA B12 deficiency  The patient had a colonoscopy, EGD, and pill cam with Dr. Oneida Alar on 06/02/2015. She has iron deficiency.She has received one dose of feraheme.  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 Hemoglobinopathy panel was reviewed and shows a normal adult hemoglobin panel.  I have recommended alpha thalassemia genotyping.  She is to continue on her B12 as prescribed.  At some point we can consider SL B12, if the  patient would remain compliant.  She will return in 2 months for a follow up with repeat CBC and iron studies.   All questions were answered. The patient knows to call the clinic with any problems, questions or concerns.  This document serves as a record of services personally performed by Ancil Linsey, MD. It was created on her behalf by Kandace Blitz, a trained medical scribe. The creation of this record is based on the scribe's personal observations and the provider's statements to them. This  document has been checked and approved by the attending provider.  I have reviewed the above documentation for accuracy and completeness, and I agree with the above.  This note was electronically signed.   Olon Russ Kristen  08/19/2015 8:12 PM

## 2015-08-21 ENCOUNTER — Ambulatory Visit (INDEPENDENT_AMBULATORY_CARE_PROVIDER_SITE_OTHER): Payer: Commercial Managed Care - HMO | Admitting: Orthopedic Surgery

## 2015-08-21 ENCOUNTER — Encounter: Payer: Self-pay | Admitting: Orthopedic Surgery

## 2015-08-21 VITALS — BP 131/73 | HR 92 | Ht 66.0 in | Wt 255.0 lb

## 2015-08-21 DIAGNOSIS — M7631 Iliotibial band syndrome, right leg: Secondary | ICD-10-CM | POA: Diagnosis not present

## 2015-08-21 NOTE — Progress Notes (Signed)
Chief Complaint  Patient presents with  . Knee Pain    Right knne pain   HPI- 65 year old female presents for evaluation of one year history of right knee pain  There was no injury. She was told she had arthritis but no treatment was initiated. She complains of 10 out of 10 pain anterolateral right knee with slipping sensation involving the patella associated with sharp aching pain swelling and giving way. Denies any back pain. She thinks her knee Is jumping out of place.  ROS  Constitutional symptoms fatigue GI symptoms blood in the stool She denied any GU symptoms denies endocrine symptoms. Denies allergies Denies cardiac symptoms   Past Medical History  Diagnosis Date  . Diabetes mellitus, type 2 ([place])   . Obesity   . Hyperlipidemia   . Hypertension   . Colon polyps     Past Surgical History  Procedure Laterality Date  . Cholecystectomy  2001    ACUTE CHOLECYSTITIS/GALLSTONES  . Bilateral tubal ligation  1979  . Upper gastrointestinal endoscopy  NOV 2008 [facility] ANEMIA    NL EXAM, urease neg  . Colonoscopy w/ polypectomy  NOV 2008 [facility] ANEMIA    POLYP NO RETRIEVED  . Colonoscopy w/ polypectomy  2006 DR. SMTH    POLYP?  Marland Kitchen Colonoscopy  05/14/2011    Dr. Oneida Alar: sessile polyp in sigmoid colon, internal hemorrhoids, hyerplastic polyps  . Esophagogastroduodenoscopy  05/14/2011    Dr. Oneida Alar: sessile polyps in the cardia, mild gastritis. Chronic duodenitis consistent with peptic duodenitis, chronic active H.pylori gastritis.   . Colonoscopy N/A 05/15/2015    Procedure: COLONOSCOPY;  Surgeon: Danie Binder, MD;  [facility];  Service: Endoscopy;  Laterality: N/A;  0830  . Esophagogastroduodenoscopy N/A 05/15/2015    Procedure: ESOPHAGOGASTRODUODENOSCOPY (EGD);  Surgeon: Danie Binder, MD;  [facility];  Service: Endoscopy;  Laterality: N/A;  . Givens capsule study N/A 06/02/2015    Procedure: GIVENS CAPSULE STUDY;  Surgeon: Danie Binder, MD;  [facility];  Service: Endoscopy;  Laterality: N/A;  0700   Family History  Problem Relation Age of Onset  . Cancer Mother     breast   . Heart disease Mother   . Diabetes Mother   . Hypertension Mother   . Stroke Mother   . Diabetes Father   . Heart attack Father   . Kidney disease Father   . Hypertension Sister   . Diabetes Sister   . Diabetes Sister   . Mental illness Sister   . Hypertension Sister   . Mental illness Sister   . Diabetes Sister   . Hypertension Sister   . Mental illness Sister   . Colon cancer Sister     may have been in her 22s when diagnosed  . Diabetes Brother    Social History  Substance Use Topics  . Smoking status: Never Smoker   . Smokeless tobacco: None  . Alcohol Use: No    Current outpatient prescriptions:  .  aspirin 81 MG tablet, Take 81 mg by mouth daily., Disp: , Rfl:  .  Calcium Citrate-Vitamin D (CITRACAL PETITES/VITAMIN D) 200-250 MG-UNIT TABS, Take 1 tablet by mouth daily. , Disp: , Rfl:  .  ergocalciferol (VITAMIN D2) 50000 units capsule, Take 1 capsule (50,000 Units total) by mouth once a week. One capsule once weekly, Disp: 12 capsule, Rfl: 1 .  Ferrous Sulfate (IRON) 325 (65 FE) MG TABS, Take 1 tablet by mouth daily., Disp: 100  each, Rfl: 2 .  glipiZIDE (GLUCOTROL) 10 MG tablet, Take 1 tablet (10 mg total) by mouth 2 (two) times daily before a meal., Disp: 180 tablet, Rfl: 1 .  Insulin Glargine (LANTUS SOLOSTAR) 100 UNIT/ML Solostar Pen, INJECT 25 UNITS SUBCUTANEOUSLY ONCE DAILY 10:00  PM, Disp: 15 mL, Rfl: 2 .  lisinopril (PRINIVIL,ZESTRIL) 20 MG tablet, Take 1 tablet (20 mg total) by mouth daily., Disp: 90 tablet, Rfl: 1 .  metFORMIN (GLUCOPHAGE) 1000 MG tablet, Take 1 tablet (1,000 mg total) by mouth 2 (two) times daily with a meal., Disp: 180 tablet, Rfl: 1 .  Multiple Vitamin (MULTIVITAMINS PO), Take 1 tablet by mouth daily. , Disp: , Rfl:  .  pravastatin (PRAVACHOL) 80 MG tablet, TAKE ONE TABLET BY MOUTH ONCE DAILY IN THE EVENING,  Disp: 90 tablet, Rfl: 1  BP 131/73 mmHg  Pulse 92  Ht 5\' 6"  (1.676 m)  Wt 255 lb (115.667 kg)  BMI 41.18 kg/m2  Physical Exam  Constitutional: She is oriented to person, place, and time. She appears well-developed and well-nourished. No distress.  Cardiovascular: Normal rate and intact distal pulses.   Musculoskeletal:  Gait: No assistive devices. No limping.  Lateral joint line tenderness over the iliotibial band palpable clicking crepitance. Range of motion full ligaments all stable muscle tone and strength normal surrounding skin: No rash lesions ulceration. Distal pulses are intact and sensation again is normal   lumbar spine nontender no increased muscle tension  Neurological: She is alert and oriented to person, place, and time. She has normal reflexes. She exhibits normal muscle tone. Coordination normal.  Sensory findings normal both lower extremities  Skin: Skin is warm and dry. No rash noted. She is not diaphoretic. No erythema. No pallor.  Psychiatric: She has a normal mood and affect. Her behavior is normal. Judgment and thought content normal.    Ortho Exam   ASSESSMENT: My personal interpretation of the images:  The x-ray was done in 2016. 4 views of the knee to oblique 1 AP one lateral show mild arthritis no acute disease  Iliotibial band syndrome right knee    PLAN Recommend Aspercreme 3 times a day as the patient has a history of polyps is undergoing workup for rectal bleeding  Inject iliotibial band  Physical therapy  Six-week follow-up

## 2015-08-21 NOTE — Patient Instructions (Addendum)
Use aspercreme 3 times a day Call to arrange therapy

## 2015-08-21 NOTE — Progress Notes (Signed)
Injection right knee iliotibial band  Verbal consent was given timeout was completed  Medication juice Depo-Medrol 40 mg, 3 mL 1% lidocaine.  Skin was prepped with alcohol and ethyl chloride  Injection was given along the lateral iliotibial band  No complications were noted

## 2015-08-25 LAB — MISC LABCORP TEST (SEND OUT): Labcorp test code: 511172

## 2015-08-31 ENCOUNTER — Ambulatory Visit (HOSPITAL_COMMUNITY): Payer: Commercial Managed Care - HMO | Attending: Orthopedic Surgery

## 2015-08-31 ENCOUNTER — Telehealth: Payer: Self-pay | Admitting: Orthopedic Surgery

## 2015-08-31 DIAGNOSIS — R2681 Unsteadiness on feet: Secondary | ICD-10-CM | POA: Insufficient documentation

## 2015-08-31 DIAGNOSIS — R262 Difficulty in walking, not elsewhere classified: Secondary | ICD-10-CM | POA: Diagnosis not present

## 2015-08-31 DIAGNOSIS — Z7409 Other reduced mobility: Secondary | ICD-10-CM | POA: Diagnosis not present

## 2015-08-31 DIAGNOSIS — M7631 Iliotibial band syndrome, right leg: Secondary | ICD-10-CM | POA: Diagnosis not present

## 2015-08-31 DIAGNOSIS — R29898 Other symptoms and signs involving the musculoskeletal system: Secondary | ICD-10-CM | POA: Diagnosis not present

## 2015-08-31 DIAGNOSIS — R5382 Chronic fatigue, unspecified: Secondary | ICD-10-CM | POA: Diagnosis not present

## 2015-08-31 DIAGNOSIS — M6281 Muscle weakness (generalized): Secondary | ICD-10-CM | POA: Diagnosis not present

## 2015-08-31 NOTE — Telephone Encounter (Signed)
Antony Haste from [facility] would like to speak with you in reference to this patient.  His number is [phone]

## 2015-08-31 NOTE — Therapy (Signed)
[facility] [place], [place], [address] Phone: [phone]   Fax:  [phone]  Physical Therapy Evaluation  Patient Details  Name: Helen Mahoney MRN: [id number] Date of Birth: [birth year] Referring Provider: Arther Abbott   Encounter Date: 08/31/2015      PT End of Session - 08/31/15 1129    Visit Number 1   Number of Visits 16   Date for PT Re-Evaluation 10/01/15   Authorization Type Medicare    Authorization Time Period 2 months (08/31/15-10/31/15)   Authorization - Visit Number 1   Authorization - Number of Visits 16   PT Start Time 0940   PT Stop Time 1034   PT Time Calculation (min) 54 min   Activity Tolerance Patient tolerated treatment well;Patient limited by fatigue   Behavior During Therapy [facility] for tasks assessed/performed      Past Medical History  Diagnosis Date  . Diabetes mellitus, type 2 ([place])   . Obesity   . Hyperlipidemia   . Hypertension   . Colon polyps     Past Surgical History  Procedure Laterality Date  . Cholecystectomy  2001    ACUTE CHOLECYSTITIS/GALLSTONES  . Bilateral tubal ligation  1979  . Upper gastrointestinal endoscopy  NOV 2008 MJ ANEMIA    NL EXAM, urease neg  . Colonoscopy w/ polypectomy  NOV 2008 MJ ANEMIA    POLYP NO RETRIEVED  . Colonoscopy w/ polypectomy  2006 DR. SMTH    POLYP?  Marland Kitchen Colonoscopy  05/14/2011    Dr. Oneida Alar: sessile polyp in sigmoid colon, internal hemorrhoids, hyerplastic polyps  . Esophagogastroduodenoscopy  05/14/2011    Dr. Oneida Alar: sessile polyps in the cardia, mild gastritis. Chronic duodenitis consistent with peptic duodenitis, chronic active H.pylori gastritis.   . Colonoscopy N/A 05/15/2015    Procedure: COLONOSCOPY;  Surgeon: Danie Binder, MD;  [facility];  Service: Endoscopy;  Laterality: N/A;  0830  . Esophagogastroduodenoscopy N/A 05/15/2015    Procedure: ESOPHAGOGASTRODUODENOSCOPY (EGD);  Surgeon: Danie Binder, MD;  Location:  [facility] ENDO SU[facility];  Service: Endoscopy;  Laterality: N/A;  . Givens capsule study N/A 06/02/2015    Procedure: GIVENS CAPSULE STUDY;  Surgeon: Danie Binder, MD;  Location: [facility]O [facility];  Service: Endoscopy;  Laterality: N/A;  0700    There were no vitals filed for this visit.       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 She reports for the last year, her patella laterally dislocates laterally at least once daily, typically when she is lying in bed and turning. Pt sleeps on side in bed, but does not use a pillow between knees. Once the knee has reduced itself, there is no pain. PLOF is limited due to chronic anemia issues, which are more limiting to activity. Patient tolerates short trips to the grocery for 20-25 minutes, but chronically feels tired, and stays in bed all day otherwise. Pt does rely on the cart while shopping.  Some difficulty with steps at home, no railing, hold on to wall. Pt still drives. Pt reports she doesn't cook much anymore because she is 'lazy.' Able to do most housework as needed.  Pt reports 0/10 falls. 2002 gallbladder surgery, denies    Pertinent History *see above    Limitations Other (  comment);Standing;Walking;House hold activities  All limited more by chonic fatigue issues than knee.    How long can you sit comfortably? N/A   How long can you stand comfortably? N/A   How long can you walk comfortably? N/A   Diagnostic tests radiographs   Patient Stated Goals decrease dislocations, improve pain, imrpove stairs at home, and speed/distance of ambulation to better access community.    Currently in Pain? No/denies   Pain Score 0-No pain            [facility] PT Assessment - 08/31/15 0001    Assessment   Medical Diagnosis R ITBS   Referring Provider Arther Abbott     Onset Date/Surgical Date 08/31/14   Hand Dominance Right   Prior Therapy None   Precautions   Precautions None   Restrictions   Weight Bearing Restrictions No   Balance Screen   Has the patient fallen in the past 6 months No   Has the patient had a decrease in activity level because of a fear of falling?  No   Is the patient reluctant to leave their home because of a fear of falling?  Yes  due to chronic fatigue   [place] residence   Living Arrangements Spouse/significant other   Type of [place] to enter   Entrance Stairs-Number of Steps 3   Entrance Stairs-Rails None   Prior Function   Level of Independence Independent with basic ADLs;Independent with community mobility with device;Needs assistance with homemaking   Cognition   Overall Cognitive Status Within Functional Limits for tasks assessed   Sensation   Light Touch Impaired by gross assessment  decreased on RLE L3-S2   Functional Tests   Functional tests --  * see summary/note   ROM / Strength   AROM / PROM / Strength --  * see summary/note   Transfers   Five time sit to stand comments  * see summary/note   Ambulation/Gait   Gait Comments * see summary/note       2MWT: 354ft RLE vaulting, limited L arm swing.   TUG-T: 10.18s  5x STS: 17.45s, BUE across chest, no AD, standard chair.   SLS: L: 10s; R: 2s  [place]homberg:  EC, Narrow Stance cut off at 30s min-mod postural sway.   MMT:  1. Hip flexors 3/5 bilat (limited range ~105* bilat) 2. Knee extension R: 5/5, L: 3+/5 (recurvatum ~5-10* bilat) 3. Knee flexors R: 5/5, 3+/5 4. Dorsiflexion L: 5/5, R: 3/5 5. Dorsiflexion L: 5/5, R: 2-/5 6. Seated Abd/Add of hips L: 5/5, R: 3+/5 7. Grip: 25% impaired on R  8. Elbows: bilat 5/5 grossly, 25% decreased on R 9. Deltoids, Upper Traps 4/5 bilat, symmetrical   LT Sensation:  Decreased in the L3-S1 dermatomes ; BUE intact            [facility] Adult  PT Treatment/Exercise - 08/31/15 0001    Balance   Balance Assessed Yes  * see summary/note   Exercises   Exercises Knee/Hip   Knee/Hip Exercises: Seated   Long Arc Quad 1 set;15 reps  1x15x3s; HEP ed             Balance Exercises - 08/31/15 1126    Balance Exercises: Standing   SLS Eyes open;Solid surface;Upper extremity support 1;10 secs;Other reps (comment)  10x10sec on R side only.            PT Education - 08/31/15 1128  Education provided Yes   Education Details explained how self imposed bed-rest can exacerbate fatgieu and anemia, and how regular physical activity is important to health and function.    Person(s) Educated Patient   Methods Explanation   Comprehension Need further instruction;Verbalized understanding;Other (comment)  does not seem to receive information well.           PT Short Term Goals - 08/31/15 1146    PT SHORT TERM GOAL #1   Title After 2 weeks pt will report decrease in daily episodes of knee dislocation/subluxation.    PT SHORT TERM GOAL #2   Title After 2 weeks pt will demonstrate indep and good ocmpliance with HEP.    PT SHORT TERM GOAL #3   Title After 4 weeks pt will demonstrate 3+/5 in all RLE muscle groups.    PT SHORT TERM GOAL #4   Title After 4 weeks pt will demonstrate improved functional mobility evidenced by 2MWT >441ft, 5x STS <9s, and R SLS >10s.            PT Long Term Goals - 08/31/15 1150    PT LONG TERM GOAL #1   Title After 7w, pt will demonstrate indep in advanced HEP for DC.    PT LONG TERM GOAL #2   Title After 8 weeks, pt will demonstrate 4/5 strength in all muscle groupd in the RLE.    PT LONG TERM GOAL #3   Title After 8 weeks, pt will show improved functional mobility evidenced by R SLS>20s, 2MWT >56ft, and gait speed> 1.30m/s.    PT LONG TERM GOAL #4   Title In 8 weeks pt will demonstrate less than 1 subluxation event per week.                Plan - 08/31/15 1132    Clinical  Impression Statement Pt reporting afte rreferral from ortho for chronic R knee pain, although finds suspicious for undocumented neurological event, which warrants further investigation. Pt demonstrating R hemiplegia RLE>RUE, with impaired light sensation in the RLE from L3-S2. The patient demonstrates functional impairment  of gait in quality, tolerance, and speed, , appropriate on  for household disatnces, and, covering only 50% the distance of healthy age-matched population norms, and a gait speed of 0.45m/s which is statistically been shown to be related to increased falls risk, and insufficicent for community ambulation.  SLS reveals limted balance on the Right side much greater than L and gait is shown to have some RLE vaulting, with adfditional stance time on RLE, and apparent LLD, although this is not assessed in detail. this visit. Pt will benefit from skilled PT interention to address the above impairments and deficits to improve patient safety, energy levels, and access of the community. Will communicate with referreing provider regarding suspected R hemiplegia.    Rehab Potential Good   Clinical Impairments Affecting Rehab Potential Chronic fatgiue/anemia x 1year. Limited concern with functional deficits.    PT Frequency 2x / week   PT Duration 8 weeks   PT Treatment/Interventions Gait training;Stair training;Functional mobility training;Therapeutic activities;Therapeutic exercise;Patient/family education;Balance training   PT Next Visit Plan Assure pt is cleared to continue with PT per Dr. Aline Brochure; review HEP, progress global RLE strengthening, particularly in quads; AVOID excessive rotation + flexion of the hip due to suspected patella dislocation.    PT Home Exercise Plan issued;    Recommended Other Services Neuro consult.    Consulted and Agree with Plan of Care Patient  did not  discuss neuro findings with patent directily.       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 - Plan: PT plan of care cert/re-cert  RUE weakness - Plan: PT plan of care cert/re-cert  Right leg weakness - Plan: PT plan of care cert/re-cert  Impaired functional mobility, balance, gait, and endurance - Plan: PT plan of care cert/re-cert  Chronic fatigue - Plan: PT plan of care cert/re-cert  Unsteadiness on feet - Plan: PT plan of care cert/re-cert      G-Codes - [id number]    Functional Assessment Tool Used Clinical judgment    Functional Limitation Mobility: Walking and moving around   Mobility: Walking and Moving Around Current Status [phone]) At least 20 percent but less than 40 percent impaired, limited or restricted   Mobility: Walking and Moving Around Goal Status [phone]) At least 1 percent but less than 20 percent impaired, limited or restricted       Problem List Patient Active Problem List   Diagnosis Date Noted  . Osteoarthritis of right knee 07/04/2015  . B12 deficiency 06/16/2015  . Microcytic anemia 05/18/2015  . Heme + stool   . FH: colon cancer 04/25/2015  . Heme positive stool 04/04/2015  . Vitamin D deficiency 10/18/2014  . IDA (iron deficiency anemia) 10/18/2014  . Diabetes mellitus, insulin dependent (IDDM), controlled ([place]) 08/20/2007  . Hyperlipemia 08/20/2007  . Morbid obesity ([place]) 08/20/2007  . Essential hypertension 08/20/2007   12:08 PM, 08/31/2015 Etta Grandchild, PT, DPT PRN Physical Therapist - [place] License # [id number] [phone] (mobile)   [facility] [address] [place], [place],  [address] Phone: [phone]   Fax:  [phone]  Name: Helen Mahoney MRN: [id number] Date of Birth: [birth year]

## 2015-09-05 ENCOUNTER — Telehealth: Payer: Self-pay

## 2015-09-05 ENCOUNTER — Ambulatory Visit (HOSPITAL_COMMUNITY): Payer: Commercial Managed Care - HMO | Admitting: Physical Therapy

## 2015-09-05 DIAGNOSIS — R5382 Chronic fatigue, unspecified: Secondary | ICD-10-CM | POA: Diagnosis not present

## 2015-09-05 DIAGNOSIS — M7631 Iliotibial band syndrome, right leg: Secondary | ICD-10-CM | POA: Diagnosis not present

## 2015-09-05 DIAGNOSIS — R262 Difficulty in walking, not elsewhere classified: Secondary | ICD-10-CM

## 2015-09-05 DIAGNOSIS — R2681 Unsteadiness on feet: Secondary | ICD-10-CM

## 2015-09-05 DIAGNOSIS — M6281 Muscle weakness (generalized): Secondary | ICD-10-CM

## 2015-09-05 DIAGNOSIS — R29898 Other symptoms and signs involving the musculoskeletal system: Secondary | ICD-10-CM | POA: Diagnosis not present

## 2015-09-05 DIAGNOSIS — Z7409 Other reduced mobility: Secondary | ICD-10-CM | POA: Diagnosis not present

## 2015-09-05 NOTE — Telephone Encounter (Signed)
pls arrange oV in next 1 week for eval and further mx, let caller and pt know , tx

## 2015-09-05 NOTE — Telephone Encounter (Signed)
Pt scheduled appt for Monday

## 2015-09-05 NOTE — Telephone Encounter (Signed)
Adalberto Ill Physical Therapist from [facility] was evaluating Helen Mahoney and he found some hemiplegia  And other findings that warranted some further evaluation and wanted you to call him back on his cell number when you could to discuss his findings [phone]

## 2015-09-05 NOTE — Therapy (Addendum)
[employer] [facility] [address] [place], [place], [address] Phone: [phone]   Fax:  [phone]  Physical Therapy Treatment  Patient Details  Name: Helen Mahoney MRN: [id number] Date of Birth: 07-24-1950 Referring Provider: Arther Abbott   Encounter Date: 09/05/2015      PT End of Session - 09/05/15 1027    Visit Number 2   Number of Visits 16   Date for PT Re-Evaluation 10/01/15   Authorization Type Medicare    Authorization Time Period 2 months (08/31/15-10/31/15)   Authorization - Visit Number 2   Authorization - Number of Visits 16   PT Start Time [phone]   PT Stop Time 1027   PT Time Calculation (min) 41 min   Activity Tolerance Patient tolerated treatment well      Past Medical History  Diagnosis Date  . Diabetes mellitus, type 2 ([place])   . Obesity   . Hyperlipidemia   . Hypertension   . Colon polyps     Past Surgical History  Procedure Laterality Date  . Cholecystectomy  2001    ACUTE CHOLECYSTITIS/GALLSTONES  . Bilateral tubal ligation  1979  . Upper gastrointestinal endoscopy  NOV 2008 MJ ANEMIA    NL EXAM, urease neg  . Colonoscopy w/ polypectomy  NOV 2008 MJ ANEMIA    POLYP NO RETRIEVED  . Colonoscopy w/ polypectomy  2006 DR. SMTH    POLYP?  Marland Kitchen Colonoscopy  05/14/2011    Dr. Oneida Alar: sessile polyp in sigmoid colon, internal hemorrhoids, hyerplastic polyps  . Esophagogastroduodenoscopy  05/14/2011    Dr. Oneida Alar: sessile polyps in the cardia, mild gastritis. Chronic duodenitis consistent with peptic duodenitis, chronic active H.pylori gastritis.   . Colonoscopy N/A 05/15/2015    Procedure: COLONOSCOPY;  Surgeon: Danie Binder, MD;  [facility];  Service: Endoscopy;  Laterality: N/A;  0830  . Esophagogastroduodenoscopy N/A 05/15/2015    Procedure: ESOPHAGOGASTRODUODENOSCOPY (EGD);  Surgeon: Danie Binder, MD;  [facility];  Service: Endoscopy;  Laterality: N/A;  . Givens capsule study N/A  06/02/2015    Procedure: GIVENS CAPSULE STUDY;  Surgeon: Danie Binder, MD;  [facility];  Service: Endoscopy;  Laterality: N/A;  0700    There were no vitals filed for this visit.      Subjective Assessment - 09/05/15 0955    Subjective [name] states that she has been doing the exercises given her with no questions.    Currently in Pain? No/denies              [facility] Adult PT Treatment/Exercise - 09/05/15 0001    Exercises   Exercises Knee/Hip   Knee/Hip Exercises: Standing   Heel Raises Both;10 reps   Knee Flexion Strengthening;Right;10 reps;Limitations   Knee Flexion Limitations 3#   Forward Lunges Right;10 reps   Lateral Step Up Right;10 reps;Hand Hold: 2   Forward Step Up Right;10 reps;Hand Hold: 2   Functional Squat 10 reps   SLS x5    Knee/Hip Exercises: Supine   Short Arc Quad Sets Strengthening;Right;10 reps;Limitations   Short Arc Quad Sets Limitations 3# neutral and ER    Straight Leg Raises 10 reps                PT Education - 09/05/15 1026    Education provided Yes   Education Details weight bearing strengthening    Person(s) Educated Patient   Methods Explanation;Tactile cues;Verbal cues;Demonstration;Handout   Comprehension Verbalized understanding;Returned demonstration  PT Short Term Goals - 08/31/15 1146    PT SHORT TERM GOAL #1   Title After 2 weeks pt will report decrease in daily episodes of knee dislocation/subluxation.    PT SHORT TERM GOAL #2   Title After 2 weeks pt will demonstrate indep and good ocmpliance with HEP.    PT SHORT TERM GOAL #3   Title After 4 weeks pt will demonstrate 3+/5 in all RLE muscle groups.    PT SHORT TERM GOAL #4   Title After 4 weeks pt will demonstrate improved functional mobility evidenced by 2MWT >460f, 5x STS <9s, and R SLS >10s.            PT Long Term Goals - 08/31/15 1150    PT LONG TERM GOAL #1   Title After 7w, pt will demonstrate indep in advanced HEP for  DC.    PT LONG TERM GOAL #2   Title After 8 weeks, pt will demonstrate 4/5 strength in all muscle groupd in the RLE.    PT LONG TERM GOAL #3   Title After 8 weeks, pt will show improved functional mobility evidenced by R SLS>20s, 2MWT >5084f and gait speed> 1.2063m    PT LONG TERM GOAL #4   Title In 8 weeks pt will demonstrate less than 1 subluxation event per week.                Plan - 09/05/15 1027    Clinical Impression Statement Pt instructed in new weightbearing exercises with both verbal and tactile cuing needed repeatedly for correct technique.  Pt voiced no pain before,during or after treatment.    Rehab Potential Good   Clinical Impairments Affecting Rehab Potential Chronic fatgiue/anemia x 1year. Limited concern with functional deficits.    PT Frequency 2x / week   PT Duration 8 weeks   PT Treatment/Interventions Gait training;Stair training;Functional mobility training;Therapeutic activities;Therapeutic exercise;Patient/family education;Balance training   PT Next Visit Plan Pt to begin sit to stand activity    PT Home Exercise Plan new HEP       Patient will benefit from skilled therapeutic intervention in order to improve the following deficits and impairments:  Abnormal gait, Decreased activity tolerance, Decreased balance, Decreased mobility, Decreased strength, Improper body mechanics, Pain, Decreased endurance, Decreased knowledge of precautions, Impaired perceived functional ability  Visit Diagnosis: Muscle weakness (generalized)  Unsteadiness on feet  Difficulty in walking, not elsewhere classified     Problem List Patient Active Problem List   Diagnosis Date Noted  . Osteoarthritis of right knee 07/04/2015  . B12 deficiency 06/16/2015  . Microcytic anemia 05/18/2015  . Heme + stool   . FH: colon cancer 04/25/2015  . Heme positive stool 04/04/2015  . Vitamin D deficiency 10/18/2014  . IDA (iron deficiency anemia) 10/18/2014  . Diabetes  mellitus, insulin dependent (IDDM), controlled ([place]4/23/2009  . Hyperlipemia 08/20/2007  . Morbid obesity ([place]/23/2009  . Essential hypertension 08/20/2007   CynRayetta HumphreyT CLT [phone]/2017, 10:35 AM  [place] [id number]: [phone]:  [phone]: JacSUSETTE SEMINARAN: [phone] of Birth: 2/4October 20, 1952 PHYSICAL THERAPY DISCHARGE SUMMARY  Visits from Start of Care: 2  Current functional level related to goals / functional outcomes: same   Remaining deficits: same   Education / Equipment: HEP  Plan: Patient agrees to discharge.  Patient goals were not met. Patient is being discharged  due to financial reasons.  ?????       Rayetta Humphrey, [place] CLT [phone]

## 2015-09-05 NOTE — Patient Instructions (Signed)
Heel Raise: Bilateral (Standing)    Rise on balls of feet. Repeat 10____ times per set. Do _1___ sets per session. Do _2___ sessions per day.  http://orth.exer.us/38   Copyright  VHI. All rights reserved.  Functional Quadriceps: Chair Squat    Keeping feet flat on floor, shoulder width apart, squat as low as is comfortable. Use support as necessary. Repeat __10__ times per set. Do _1___ sets per session. Do __2__ sessions per day.  http://orth.exer.us/736   Copyright  VHI. All rights reserved.  Hip Hike    Stand on step, right leg off step, knee straight. Raise unsupported hip, keeping knee straight. Repeat __10__ times per set. Do __1__ sets per session. Do __2__ sessions per day.  http://orth.exer.us/692   Copyright  VHI. All rights reserved.  Strengthening: Terminal Knee Extension (Supine)    With right knee over bolster, straighten knee by tightening muscles on top of thigh. Keep bottom of knee on bolster. Repeat _10___ times per set. Do __1__ sets per session. Do ____2 sessions per day.  http://orth.exer.us/626   Copyright  VHI. All rights reserved.  Strengthening: Straight Leg Raise (Phase 1)    Tighten muscles on front of right thigh, then lift leg __15__ inches from surface, keeping knee locked.  Repeat _10___ times per set. Do _1___ sets per session. Do ___2_ sessions per day.  http://orth.exer.us/614   Copyright  VHI. All rights reserved.

## 2015-09-07 ENCOUNTER — Encounter (HOSPITAL_COMMUNITY): Payer: Commercial Managed Care - HMO | Admitting: Physical Therapy

## 2015-09-11 ENCOUNTER — Encounter: Payer: Self-pay | Admitting: Family Medicine

## 2015-09-11 ENCOUNTER — Ambulatory Visit (INDEPENDENT_AMBULATORY_CARE_PROVIDER_SITE_OTHER): Payer: Commercial Managed Care - HMO | Admitting: Family Medicine

## 2015-09-11 VITALS — BP 120/80 | HR 76 | Resp 16 | Ht 66.0 in | Wt 254.1 lb

## 2015-09-11 DIAGNOSIS — I10 Essential (primary) hypertension: Secondary | ICD-10-CM

## 2015-09-11 DIAGNOSIS — Z23 Encounter for immunization: Secondary | ICD-10-CM

## 2015-09-11 DIAGNOSIS — M1711 Unilateral primary osteoarthritis, right knee: Secondary | ICD-10-CM

## 2015-09-11 DIAGNOSIS — E559 Vitamin D deficiency, unspecified: Secondary | ICD-10-CM | POA: Diagnosis not present

## 2015-09-11 MED ORDER — GLIPIZIDE 10 MG PO TABS: 10.0000 mg | ORAL_TABLET | Freq: Two times a day (BID) | ORAL | Status: DC

## 2015-09-11 MED ORDER — LISINOPRIL 20 MG PO TABS: 20.0000 mg | ORAL_TABLET | Freq: Every day | ORAL | Status: DC

## 2015-09-11 MED ORDER — METFORMIN HCL 1000 MG PO TABS: 1000.0000 mg | ORAL_TABLET | Freq: Two times a day (BID) | ORAL | Status: DC

## 2015-09-11 MED ORDER — PRAVASTATIN SODIUM 80 MG PO TABS: ORAL_TABLET | ORAL | Status: DC

## 2015-09-11 MED ORDER — ERGOCALCIFEROL 1.25 MG (50000 UT) PO CAPS: 50000.0000 [IU] | ORAL_CAPSULE | ORAL | Status: DC

## 2015-09-11 NOTE — Progress Notes (Signed)
Subjective:    Patient ID: Helen Mahoney, female    DOB: Jan 14, 1951, 65 y.o.   MRN: [id number]  HPI   CANEI STROHMEIER     MRN: [id number]      DOB: 25-Apr-1951   HPI Helen Mahoney is here for evaluation of right sided weakness  Per call from physical therapy dept last week. Pt denies any new weakness, states the pain in her knee which was why she was referred is much improved, and that the cost of therapy is prohibitive. Has exercises at home which she will do  Preventive health is updated, specifically  Cancer screening and Immunization.   Questions or concerns regarding consultations or procedures which the PT has had in the interim are  addressed. The PT denies any adverse reactions to current medications since the last visit.  There are no new concerns.  There are no specific complaints   ROS Denies recent fever or chills. Denies sinus pressure, nasal congestion, ear pain or sore throat. Denies chest congestion, productive cough or wheezing. Denies chest pains, palpitations and leg swelling Denies abdominal pain, nausea, vomiting,diarrhea or constipation.   Denies dysuria, frequency, hesitancy or incontinence. Denies uncontrolled  joint pain, swelling and limitation in mobility. Denies headaches, seizures, numbness, or tingling. Denies depression, anxiety or insomnia. Denies skin break down or rash.   PE  BP 120/80 mmHg  Pulse 76  Resp 16  Ht 5\' 6"  (1.676 m)  Wt 254 lb 1.9 oz (115.268 kg)  BMI 41.04 kg/m2  SpO2 97%  Patient alert and oriented and in no cardiopulmonary distress.  HEENT: No facial asymmetry, EOMI,   oropharynx pink and moist.  Neck supple no JVD, no mass.  Chest: Clear to auscultation bilaterally.  CVS: S1, S2 no murmurs, no S3.Regular rate.  ABD: Soft non tender.   Ext: No edema  MS: Adequate ROM spine, shoulders, hips and knees.  Skin: Intact, no ulcerations or rash noted.  Psych: Good eye contact, normal affect. Memory intact not  anxious or depressed appearing.  CNS: CN 2-12 intact, power,  normal throughout.no focal deficits noted.   Assessment & Plan   Osteoarthritis of right knee Improved function and less pain following physical therapy, has also been evaluated by ortho, intends to continue home PT due to cost  Essential hypertension Controlled, no change in medication DASH diet and commitment to daily physical activity for a minimum of 30 minutes discussed and encouraged, as a part of hypertension management. The importance of attaining a healthy weight is also discussed.  BP/Weight 09/11/2015 08/21/2015 08/04/2015 07/14/2015 07/10/2015 [id number]  Systolic BP [id number]  Diastolic BP 80 73 64 58 70 80 75  Wt. (Lbs) 254.12 255 - 250 - 252 -  BMI 41.04 41.18 - 40.37 - 40.69 -        Morbid obesity Deteriorated. Patient re-educated about  the importance of commitment to a  minimum of 150 minutes of exercise per week.  The importance of healthy food choices with portion control discussed. Encouraged to start a food diary, count calories and to consider  joining a support group. Sample diet sheets offered. Goals set by the patient for the next several months.   Weight /BMI 09/11/2015 08/21/2015 07/14/2015  WEIGHT 254 lb 1.9 oz 255 lb 250 lb  HEIGHT 5\' 6"  5\' 6"  -  BMI 41.04 kg/m2 41.18 kg/m2 40.37 kg/m2    Current exercise per week 90 minutes.  Review of Systems     Objective:   Physical Exam        Assessment & Plan:

## 2015-09-11 NOTE — Patient Instructions (Signed)
F/u as before, call if you need me sooner  Since you report 100 % pain free and cost of therapy is too much and don't see benefit, I will pass msg to Dr Aline Brochure  No evidence of  Weakness a this visit  Gait is stable  Do exercise for strengthening at home as directed regularly, and continue to work on weight loss

## 2015-09-12 ENCOUNTER — Encounter (HOSPITAL_COMMUNITY): Payer: Commercial Managed Care - HMO | Admitting: Physical Therapy

## 2015-09-14 ENCOUNTER — Encounter (HOSPITAL_COMMUNITY): Payer: Commercial Managed Care - HMO | Admitting: Physical Therapy

## 2015-09-16 NOTE — Assessment & Plan Note (Signed)
Deteriorated. Patient re-educated about  the importance of commitment to a  minimum of 150 minutes of exercise per week.  The importance of healthy food choices with portion control discussed. Encouraged to start a food diary, count calories and to consider  joining a support group. Sample diet sheets offered. Goals set by the patient for the next several months.   Weight /BMI 09/11/2015 08/21/2015 07/14/2015  WEIGHT 254 lb 1.9 oz 255 lb 250 lb  HEIGHT 5\' 6"  5\' 6"  -  BMI 41.04 kg/m2 41.18 kg/m2 40.37 kg/m2    Current exercise per week 90 minutes.

## 2015-09-16 NOTE — Assessment & Plan Note (Signed)
Controlled, no change in medication DASH diet and commitment to daily physical activity for a minimum of 30 minutes discussed and encouraged, as a part of hypertension management. The importance of attaining a healthy weight is also discussed.  BP/Weight 09/11/2015 08/21/2015 08/04/2015 07/14/2015 07/10/2015 [id number]  Systolic BP [id number]  Diastolic BP 80 73 64 58 70 80 75  Wt. (Lbs) 254.12 255 - 250 - 252 -  BMI 41.04 41.18 - 40.37 - 40.69 -

## 2015-09-16 NOTE — Assessment & Plan Note (Signed)
Improved function and less pain following physical therapy, has also been evaluated by ortho, intends to continue home PT due to cost

## 2015-09-18 ENCOUNTER — Ambulatory Visit (HOSPITAL_COMMUNITY): Payer: Commercial Managed Care - HMO

## 2015-09-18 ENCOUNTER — Encounter (HOSPITAL_COMMUNITY): Payer: Commercial Managed Care - HMO | Attending: Hematology & Oncology

## 2015-09-18 VITALS — BP 122/81 | HR 72 | Temp 97.8°F | Resp 16

## 2015-09-18 DIAGNOSIS — E669 Obesity, unspecified: Secondary | ICD-10-CM | POA: Insufficient documentation

## 2015-09-18 DIAGNOSIS — Z823 Family history of stroke: Secondary | ICD-10-CM | POA: Insufficient documentation

## 2015-09-18 DIAGNOSIS — Z7984 Long term (current) use of oral hypoglycemic drugs: Secondary | ICD-10-CM | POA: Insufficient documentation

## 2015-09-18 DIAGNOSIS — Z9049 Acquired absence of other specified parts of digestive tract: Secondary | ICD-10-CM | POA: Insufficient documentation

## 2015-09-18 DIAGNOSIS — E538 Deficiency of other specified B group vitamins: Secondary | ICD-10-CM

## 2015-09-18 DIAGNOSIS — D509 Iron deficiency anemia, unspecified: Secondary | ICD-10-CM | POA: Insufficient documentation

## 2015-09-18 DIAGNOSIS — Z794 Long term (current) use of insulin: Secondary | ICD-10-CM | POA: Insufficient documentation

## 2015-09-18 DIAGNOSIS — E119 Type 2 diabetes mellitus without complications: Secondary | ICD-10-CM | POA: Insufficient documentation

## 2015-09-18 DIAGNOSIS — I10 Essential (primary) hypertension: Secondary | ICD-10-CM | POA: Insufficient documentation

## 2015-09-18 DIAGNOSIS — Z833 Family history of diabetes mellitus: Secondary | ICD-10-CM | POA: Insufficient documentation

## 2015-09-18 DIAGNOSIS — Z803 Family history of malignant neoplasm of breast: Secondary | ICD-10-CM | POA: Insufficient documentation

## 2015-09-18 DIAGNOSIS — Z86010 Personal history of colonic polyps: Secondary | ICD-10-CM | POA: Insufficient documentation

## 2015-09-18 DIAGNOSIS — Z880 Allergy status to penicillin: Secondary | ICD-10-CM | POA: Insufficient documentation

## 2015-09-18 DIAGNOSIS — Z79899 Other long term (current) drug therapy: Secondary | ICD-10-CM | POA: Insufficient documentation

## 2015-09-18 DIAGNOSIS — Z888 Allergy status to other drugs, medicaments and biological substances status: Secondary | ICD-10-CM | POA: Insufficient documentation

## 2015-09-18 DIAGNOSIS — Z9889 Other specified postprocedural states: Secondary | ICD-10-CM | POA: Insufficient documentation

## 2015-09-18 DIAGNOSIS — Z7982 Long term (current) use of aspirin: Secondary | ICD-10-CM | POA: Insufficient documentation

## 2015-09-18 DIAGNOSIS — Z808 Family history of malignant neoplasm of other organs or systems: Secondary | ICD-10-CM | POA: Insufficient documentation

## 2015-09-18 DIAGNOSIS — E785 Hyperlipidemia, unspecified: Secondary | ICD-10-CM | POA: Insufficient documentation

## 2015-09-18 MED ORDER — CYANOCOBALAMIN 1000 MCG/ML IJ SOLN
1000.0000 ug | Freq: Once | INTRAMUSCULAR | Status: AC
  Administered 2015-09-18: 1000 ug via INTRAMUSCULAR
  Filled 2015-09-18: qty 1

## 2015-09-18 NOTE — Progress Notes (Signed)
Helen Mahoney presents today for injection per MD orders. B12 1000mcg administered IM in right Upper Arm. Administration without incident. Patient tolerated well.  

## 2015-09-18 NOTE — Patient Instructions (Signed)
[facility] at [facility] Discharge Instructions  RECOMMENDATIONS MADE BY THE CONSULTANT AND ANY TEST RESULTS WILL BE SENT TO YOUR REFERRING PHYSICIAN.  B12 today.    Thank you for choosing [facility] at [facility] to provide your oncology and hematology care.  To afford each patient quality time with our provider, please arrive at least 15 minutes before your scheduled appointment time.   Beginning January 23rd 2017 lab work for the Cancer Center will be done in the  Main lab at [facility] on 1st floor. If you have a lab appointment with the Cancer Center please come in thru the  Main Entrance and check in at the main information desk  You need to re-schedule your appointment should you arrive 10 or more minutes late.  We strive to give you quality time with our providers, and arriving late affects you and other patients whose appointments are after yours.  Also, if you no show three or more times for appointments you may be dismissed from the clinic at the providers discretion.     Again, thank you for choosing [facility].  Our hope is that these requests will decrease the amount of time that you wait before being seen by our physicians.       _____________________________________________________________  Should you have questions after your visit to [facility], please contact our office at [phone] between the hours of 8:30 a.m. and 4:30 p.m.  Voicemails left after 4:30 p.m. will not be returned until the following business day.  For prescription refill requests, have your pharmacy contact our office.         Resources For Cancer Patients and their Caregivers ? American Cancer Society: Can assist with transportation, wigs, general needs, runs Look Good Feel Better.        1-888-227-6333 ? Cancer Care: Provides financial assistance, online support groups, medication/co-pay assistance.  1-800-813-HOPE  (4673) ? [facility] Assists [place] cancer patients and their families through emotional , educational and financial support.  336-427-4357 ? [place] DSS Where to apply for food stamps, Medicaid and utility assistance. 336-342-1394 ? RCATS: Transportation to medical appointments. 336-347-2287 ? Social Security Administration: May apply for disability if have a Stage IV cancer. 336-342-7796 1-800-772-1213 ? [place] Aging, Disability and Transit Services: Assists with nutrition, care and transit needs. 336-349-2343  Cancer Center Support Programs: @10RELATIVEDAYS@ > Cancer Support Group  2nd Tuesday of the month 1pm-2pm, Journey Room  > Creative Journey  3rd Tuesday of the month 1130am-1pm, Journey Room  > Look Good Feel Better  1st Wednesday of the month 10am-12 noon, Journey Room (Call American Cancer Society to register 1-800-395-5775)    

## 2015-09-19 ENCOUNTER — Encounter (HOSPITAL_COMMUNITY): Payer: Commercial Managed Care - HMO

## 2015-09-21 ENCOUNTER — Encounter (HOSPITAL_COMMUNITY): Payer: Commercial Managed Care - HMO

## 2015-09-26 ENCOUNTER — Encounter (HOSPITAL_COMMUNITY): Payer: Commercial Managed Care - HMO | Admitting: Physical Therapy

## 2015-09-28 ENCOUNTER — Encounter (HOSPITAL_COMMUNITY): Payer: Commercial Managed Care - HMO

## 2015-10-02 ENCOUNTER — Ambulatory Visit: Payer: Commercial Managed Care - HMO | Admitting: Orthopedic Surgery

## 2015-10-03 ENCOUNTER — Encounter (HOSPITAL_COMMUNITY): Payer: Commercial Managed Care - HMO

## 2015-10-05 ENCOUNTER — Ambulatory Visit (HOSPITAL_COMMUNITY): Payer: Commercial Managed Care - HMO | Admitting: Physical Therapy

## 2015-10-10 ENCOUNTER — Encounter (HOSPITAL_COMMUNITY): Payer: Commercial Managed Care - HMO | Admitting: Physical Therapy

## 2015-10-12 ENCOUNTER — Encounter (HOSPITAL_COMMUNITY): Payer: Commercial Managed Care - HMO | Admitting: Physical Therapy

## 2015-10-17 ENCOUNTER — Encounter (HOSPITAL_COMMUNITY): Payer: Commercial Managed Care - HMO

## 2015-10-18 ENCOUNTER — Encounter (HOSPITAL_BASED_OUTPATIENT_CLINIC_OR_DEPARTMENT_OTHER): Payer: Commercial Managed Care - HMO

## 2015-10-18 ENCOUNTER — Encounter (HOSPITAL_COMMUNITY): Payer: Commercial Managed Care - HMO

## 2015-10-18 ENCOUNTER — Encounter (HOSPITAL_COMMUNITY): Payer: Self-pay | Admitting: Oncology

## 2015-10-18 ENCOUNTER — Encounter (HOSPITAL_COMMUNITY): Payer: Commercial Managed Care - HMO | Attending: Hematology & Oncology | Admitting: Oncology

## 2015-10-18 VITALS — BP 135/74 | HR 81 | Temp 97.5°F | Resp 18 | Wt 257.0 lb

## 2015-10-18 DIAGNOSIS — E669 Obesity, unspecified: Secondary | ICD-10-CM | POA: Insufficient documentation

## 2015-10-18 DIAGNOSIS — Z888 Allergy status to other drugs, medicaments and biological substances status: Secondary | ICD-10-CM | POA: Diagnosis not present

## 2015-10-18 DIAGNOSIS — Z880 Allergy status to penicillin: Secondary | ICD-10-CM | POA: Diagnosis not present

## 2015-10-18 DIAGNOSIS — Z833 Family history of diabetes mellitus: Secondary | ICD-10-CM | POA: Diagnosis not present

## 2015-10-18 DIAGNOSIS — Z9889 Other specified postprocedural states: Secondary | ICD-10-CM | POA: Diagnosis not present

## 2015-10-18 DIAGNOSIS — Z9049 Acquired absence of other specified parts of digestive tract: Secondary | ICD-10-CM | POA: Insufficient documentation

## 2015-10-18 DIAGNOSIS — D563 Thalassemia minor: Secondary | ICD-10-CM

## 2015-10-18 DIAGNOSIS — Z7984 Long term (current) use of oral hypoglycemic drugs: Secondary | ICD-10-CM | POA: Diagnosis not present

## 2015-10-18 DIAGNOSIS — Z823 Family history of stroke: Secondary | ICD-10-CM | POA: Insufficient documentation

## 2015-10-18 DIAGNOSIS — Z803 Family history of malignant neoplasm of breast: Secondary | ICD-10-CM | POA: Diagnosis not present

## 2015-10-18 DIAGNOSIS — I10 Essential (primary) hypertension: Secondary | ICD-10-CM | POA: Diagnosis not present

## 2015-10-18 DIAGNOSIS — E538 Deficiency of other specified B group vitamins: Secondary | ICD-10-CM

## 2015-10-18 DIAGNOSIS — E785 Hyperlipidemia, unspecified: Secondary | ICD-10-CM | POA: Diagnosis not present

## 2015-10-18 DIAGNOSIS — Z794 Long term (current) use of insulin: Secondary | ICD-10-CM | POA: Insufficient documentation

## 2015-10-18 DIAGNOSIS — Z79899 Other long term (current) drug therapy: Secondary | ICD-10-CM | POA: Insufficient documentation

## 2015-10-18 DIAGNOSIS — Z7982 Long term (current) use of aspirin: Secondary | ICD-10-CM | POA: Insufficient documentation

## 2015-10-18 DIAGNOSIS — Z808 Family history of malignant neoplasm of other organs or systems: Secondary | ICD-10-CM | POA: Diagnosis not present

## 2015-10-18 DIAGNOSIS — D509 Iron deficiency anemia, unspecified: Secondary | ICD-10-CM | POA: Insufficient documentation

## 2015-10-18 DIAGNOSIS — E119 Type 2 diabetes mellitus without complications: Secondary | ICD-10-CM | POA: Diagnosis not present

## 2015-10-18 DIAGNOSIS — Z86010 Personal history of colonic polyps: Secondary | ICD-10-CM | POA: Diagnosis not present

## 2015-10-18 LAB — IRON AND TIBC
Iron: 51 ug/dL (ref 28–170)
Saturation Ratios: 17 % (ref 10.4–31.8)
TIBC: 298 ug/dL (ref 250–450)
UIBC: 247 ug/dL

## 2015-10-18 LAB — CBC WITH DIFFERENTIAL/PLATELET
Basophils Absolute: 0 K/uL (ref 0.0–0.1)
Basophils Relative: 0 %
Eosinophils Absolute: 0.3 K/uL (ref 0.0–0.7)
Eosinophils Relative: 4 %
HCT: 34.4 % — ABNORMAL LOW (ref 36.0–46.0)
Hemoglobin: 10.6 g/dL — ABNORMAL LOW (ref 12.0–15.0)
Lymphocytes Relative: 26 %
Lymphs Abs: 1.9 K/uL (ref 0.7–4.0)
MCH: 21.6 pg — ABNORMAL LOW (ref 26.0–34.0)
MCHC: 30.8 g/dL (ref 30.0–36.0)
MCV: 70.2 fL — ABNORMAL LOW (ref 78.0–100.0)
Monocytes Absolute: 0.4 K/uL (ref 0.1–1.0)
Monocytes Relative: 5 %
Neutro Abs: 4.8 K/uL (ref 1.7–7.7)
Neutrophils Relative %: 65 %
Platelets: 313 K/uL (ref 150–400)
RBC: 4.9 MIL/uL (ref 3.87–5.11)
RDW: 17.1 % — ABNORMAL HIGH (ref 11.5–15.5)
WBC: 7.4 K/uL (ref 4.0–10.5)

## 2015-10-18 LAB — FERRITIN: Ferritin: 149 ng/mL (ref 11–307)

## 2015-10-18 MED ORDER — CYANOCOBALAMIN 1000 MCG/ML IJ SOLN
1000.0000 ug | Freq: Once | INTRAMUSCULAR | Status: AC
  Administered 2015-10-18: 1000 ug via INTRAMUSCULAR

## 2015-10-18 MED ORDER — CYANOCOBALAMIN 1000 MCG/ML IJ SOLN
INTRAMUSCULAR | Status: AC
  Filled 2015-10-18: qty 1

## 2015-10-18 NOTE — Progress Notes (Signed)
Helen Mahoney presents today for injection per MD orders. B12 1000mcg administered IM in right Upper Arm. Administration without incident. Patient tolerated well.  

## 2015-10-18 NOTE — Patient Instructions (Signed)
[place] at [facility] Discharge Instructions  RECOMMENDATIONS MADE BY THE CONSULTANT AND ANY TEST RESULTS WILL BE SENT TO YOUR REFERRING PHYSICIAN.  Exam done and seen today by Helen Mahoney Will get B12 today and monthly. Labs in 4 months Return to see the Doctor in 4 months Call the clinic for any concerns or questions.  Thank you for choosing [place] at [facility] to provide your oncology and hematology care.  To afford each patient quality time with our provider, please arrive at least 15 minutes before your scheduled appointment time.   Beginning January 23rd 2017 lab work for the [facility] will be done in the  Main lab at [facility] on 1st floor. If you have a lab appointment with the [facility] please come in thru the  Main Entrance and check in at the main information desk  You need to re-schedule your appointment should you arrive 10 or more minutes late.  We strive to give you quality time with our providers, and arriving late affects you and other patients whose appointments are after yours.  Also, if you no show three or more times for appointments you may be dismissed from the clinic at the providers discretion.     Again, thank you for choosing [facility].  Our hope is that these requests will decrease the amount of time that you wait before being seen by our physicians.       _____________________________________________________________  Should you have questions after your visit to [facility], please contact our office at [phone] between the hours of 8:30 a.m. and 4:30 p.m.  Voicemails left after 4:30 p.m. will not be returned until the following business day.  For prescription refill requests, have your pharmacy contact our office.         Resources For Cancer Patients and their Caregivers ? American Cancer Society: Can assist with transportation, wigs, general needs, runs  Look Good Feel Better.        732 569 3718 ? Cancer Care: Provides financial assistance, online support groups, medication/co-pay assistance.  1-800-813-HOPE 626-604-0623) ? [facility] Assists [place] cancer patients and their families through emotional , educational and financial support.  3042705176 ? [place] DSS Where to apply for food stamps, Medicaid and utility assistance. 337-046-6657 ? RCATS: Transportation to medical appointments. 317-239-8140 ? Social Security Administration: May apply for disability if have a Stage IV cancer. 561-620-2695 314-695-9542 ? [employer], Disability and Transit Services: Assists with nutrition, care and transit needs. [place] Support Programs: @10RELATIVEDAYS @ > Cancer Support Group  2nd Tuesday of the month 1pm-2pm, Journey Room  > Creative Journey  3rd Tuesday of the month 1130am-1pm, Journey Room  > Look Good Feel Better  1st Wednesday of the month 10am-12 noon, Journey Room (Call [place] to register [phone])

## 2015-10-18 NOTE — Progress Notes (Signed)
Helen Nakayama, MD [address] [place] [address]  Alpha thalassemia trait - Plan: CBC with Differential, Iron and TIBC, Ferritin  B12 deficiency - Plan: CBC with Differential, Vitamin B12  CURRENT THERAPY: Iron replacement when indicated and B12 replacement therapy.  INTERVAL HISTORY: Helen Mahoney 65 y.o. 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  She is S/P complete GI work-up including EGD/colonoscopy on 05/15/2015 showing redundant L colon, diverticulosos, small internal hemorrhoids, gastric polyps and GE junction stricture.  Camera Capsule study was performed on 06/02/2015 demonstrating occasional erosion in duodenum, no masses, ulcers, or AVMs, and occasional lymphangectasia. AND B12 deficiency with negative intrinsic factor antibody testing on 06/26/2015.  Now on B12 IM replacement therapy.  She is doing well and denies any active complaints.  She notes that she saw Dr. Aline Brochure for right knee pain. He administered an injection. She notes that she developed a significant ecchymosis. This has since nearly resolved. She was subsequently referred to physical therapy by Dr. Aline Brochure for which she did not comply with.  The patient is given significant education regarding alpha thalassemia trait. I have answered all of her questions regarding this to her satisfaction. I also reviewed the genetics of this issue. Her sister accompanies her today and denies any known laboratory abnormalities. Her primary care physician is Dr. Moshe Cipro.  Review of Systems  Constitutional: Negative.  Negative for fever, chills and weight loss.  HENT: Negative.   Eyes: Negative.   Respiratory: Negative.   Cardiovascular: Negative.   Gastrointestinal: Negative.  Negative for  blood in stool and melena.  Genitourinary: Negative.   Musculoskeletal: Positive for joint pain (right knee pain, followed by ortho (Dr. Aline Brochure)).  Skin: Negative.   Neurological: Negative.   Endo/Heme/Allergies: Negative.   Psychiatric/Behavioral: Negative.     Past Medical History  Diagnosis Date  . Diabetes mellitus, type 2 ([place])   . Obesity   . Hyperlipidemia   . Hypertension   . Colon polyps   . Microcytic anemia 05/18/2015    2013: TCS/EGD 2017: TCS/EGD HYPERPLASTIC GASTRIC POLYPS, LYMPHOCYTIC GASTRITIS   . B12 deficiency 06/16/2015  . 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 Cholecystectomy  2001    ACUTE CHOLECYSTITIS/GALLSTONES  . Bilateral tubal ligation  1979  . Upper gastrointestinal endoscopy  NOV 2008 [facility] ANEMIA    NL EXAM, urease neg  . Colonoscopy w/ polypectomy  NOV 2008 [facility] ANEMIA    POLYP NO RETRIEVED  . Colonoscopy w/ polypectomy  2006 DR. SMTH    POLYP?  [facility] Colonoscopy  05/14/2011    Dr. Oneida Alar: sessile polyp in sigmoid colon, internal hemorrhoids, hyerplastic polyps  . Esophagogastroduodenoscopy  05/14/2011    Dr. Oneida Alar: sessile polyps in the cardia, mild gastritis. Chronic duodenitis consistent with peptic duodenitis, chronic active H.pylori gastritis.   . Colonoscopy N/A 05/15/2015    Procedure: COLONOSCOPY;  Surgeon: Danie Binder, MD;  [facility];  Service: Endoscopy;  Laterality: N/A;  0830  . Esophagogastroduodenoscopy N/A 05/15/2015    Procedure: ESOPHAGOGASTRODUODENOSCOPY (EGD);  Surgeon: Danie Binder, MD;  Location: [facility];  Service: Endoscopy;  Laterality: N/A;  . Givens capsule study N/A 06/02/2015  Procedure: GIVENS CAPSULE STUDY;  Surgeon: Danie Binder, MD;  Location: [facility];  Service: Endoscopy;  Laterality: N/A;  0700    Family History  Problem Relation Age of Onset    . Cancer Mother     breast   . Heart disease Mother   . Diabetes Mother   . Hypertension Mother   . Stroke Mother   . Diabetes Father   . Heart attack Father   . Kidney disease Father   . Hypertension Sister   . Diabetes Sister   . Diabetes Sister   . Mental illness Sister   . Hypertension Sister   . Mental illness Sister   . Diabetes Sister   . Hypertension Sister   . Mental illness Sister   . Colon cancer Sister     may have been in her 38s when diagnosed  . Diabetes Brother     Social History   Social History  . Marital Status: Married    Spouse Name: N/A  . Number of Children: N/A  . Years of Education: N/A   Social History Main Topics  . Smoking status: Never Smoker   . Smokeless tobacco: None  . Alcohol Use: No  . Drug Use: No  . Sexual Activity: Not Asked   Other Topics Concern  . None   Social History Narrative     PHYSICAL EXAMINATION  ECOG PERFORMANCE STATUS: 0 - Asymptomatic  Filed Vitals:   10/18/15 1009  BP: 135/74  Pulse: 81  Temp: 97.5 F (36.4 C)  Resp: 18    GENERAL:alert, no distress, well nourished, well developed, comfortable, cooperative, obese, smiling and accompanied by her sister. SKIN: skin color, texture, turgor are normal, no rashes or significant lesions HEAD: Normocephalic, No masses, lesions, tenderness or abnormalities EYES: normal, EOMI, Conjunctiva are pink and non-injected EARS: External ears normal OROPHARYNX:lips, buccal mucosa, and tongue normal and mucous membranes are moist  NECK: supple, trachea midline LYMPH:  no palpable lymphadenopathy BREAST:not examined LUNGS: clear to auscultation  HEART: regular rate & rhythm ABDOMEN:abdomen soft and obese BACK: Back symmetric, no curvature. EXTREMITIES:less then 2 second capillary refill, no joint deformities, effusion, or inflammation, no skin discoloration, no cyanosis  NEURO: alert & oriented x 3 with fluent speech, no focal motor/sensory deficits, gait  normal   LABORATORY DATA: CBC    Component Value Date/Time   WBC 7.4 10/18/2015 0946   RBC 4.90 10/18/2015 0946   RBC 4.81 07/14/2015 0953   HGB 10.6* 10/18/2015 0946   HCT 34.4* 10/18/2015 0946   PLT 313 10/18/2015 0946   MCV 70.2* 10/18/2015 0946   MCH 21.6* 10/18/2015 0946   MCHC 30.8 10/18/2015 0946   RDW 17.1* 10/18/2015 0946   LYMPHSABS 1.9 10/18/2015 0946   MONOABS 0.4 10/18/2015 0946   EOSABS 0.3 10/18/2015 0946   BASOSABS 0.0 10/18/2015 0946      Chemistry      Component Value Date/Time   NA 139 06/27/2015 0957   K 4.3 06/27/2015 0957   CL 105 06/27/2015 0957   CO2 26 06/27/2015 0957   BUN 13 06/27/2015 0957   CREATININE 0.88 06/27/2015 0957   CREATININE 0.88 06/15/2015 1510      Component Value Date/Time   CALCIUM 8.9 06/27/2015 0957   ALKPHOS 107 06/15/2015 1510   AST 21 06/15/2015 1510   ALT 20 06/15/2015 1510   BILITOT 0.3 06/15/2015 1510      Lab Results  Component Value Date   IRON 39* 03/31/2015  FERRITIN 33 07/14/2015     PENDING LABS:   RADIOGRAPHIC STUDIES:  No results found.   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  Camera Capsule study was performed on 06/02/2015 demonstrating occasional erosion in duodenum, no masses, ulcers, or AVMs, and occasional lymphangectasia.  Oncology Flowsheet 08/04/2015  ferumoxytol [facility]) IV 510 mg    Labs today: CBC diff, iron/TIBC, ferritin.  I personally reviewed and went over laboratory results with the patient.  The results are noted within this dictation.  Hemoglobin is stable to slightly improved at 10.6 g/dL today. Microcytosis  persists.  Iron studies are pending at this time.  Alpha thalassemia DNA analysis was performed on 08/17/2015. This demonstrated mutations identified at alpha 3. 7 and alpha 3. 7. 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  Individuals with alpha-0-thalassemia trait can demonstrate microcytosis, hypochromia, and normal levels of HbA2 and HbF.  However, they typically had no significant clinical findings. Additionally, this individual is negative for the Hemoglobin Constant Spring mutation. The presence of other non-deletion of mutations, likely mutations, and the alpha-globin gene cannot be excluded as the assay does not detect these changes.  Cohen inheritance of alpha-thalassemia and other hemoglobinopathies, such as beta-thalassemia in sickle cell disease is possible. Genetic counseling hemoglobinopathy testing are recommended for this individual's reproductive partner and family members. Alpha-thalassemia is an autosomal recessive condition the results from a deletion or dysfunction of alpha-globin chains. There are four genes that make the alpha-globin chain, which binds with the beta-globin chain to create the alpha alpha/beta beta hemoglobin tetramer.  An imbalance of alpha and beta chains can lead to disease, ranging from mild anemia to fatal hydrops fetalis. Hemoglobin Constant Spring is a non-deletion of alpha thalassemia mutation that is more common in [place]. Hemoglobin Constant Spring is caused by mutation in the stop codon of the alpha(2)-globin gene that results in poor alpha-globin production.    She is provided a booklet with patient information regarding Alpha-0-Thalassemia trait.  She is educated that we will need to monitor her iron studies as those with alpha thalassemia can become iron deficient.  She notes an evaluation for right knee pain by Dr.  Aline Brochure.  She received a Depo-Medrol 40 mg, 3 mL 1% lidocaine injection.  This resulted in an ecchymosis that is nearly resolved at this time.  She notes that she was referred to physical therapy which she did not comply with.  Labs in 4 months: CBC diff, iron/TIBC, ferritin.  Return in 4 months for follow-up.  If all is well in 4 months, it would be reasonable to decrease frequency of appointments and or lab appointments potentially.  She may benefit from a genetic counseling referral and this can be discussed at her follow-up appointment.  B12 deficiency Significant B12 deficiency with negative intrinsic factor antibody testing on 06/26/2015.  Now on B12 IM replacement therapy.  Oncology Flowsheet 06/26/2015 07/03/2015 07/10/2015  cyanocobalamin ((VITAMIN B-12)) IM 1,000 mcg 1,000 mcg 1,000 mcg   Oncology Flowsheet 08/04/2015 08/17/2015 09/18/2015  cyanocobalamin ((VITAMIN B-12)) IM 1,000 mcg 1,000 mcg 1,000 mcg   B12 injection today.  Although her intrinsic factor antibody  testing is negative, given her significant B12 deficiency, I am hesitant to try PO B12 replacement at this time.  I recommend continued IM replacement given its cost effectiveness and improved absorption compared to oral replacement.    ORDERS PLACED FOR THIS ENCOUNTER: Orders Placed This Encounter  Procedures  . CBC with Differential  . Iron and TIBC  . Ferritin  . Vitamin B12    MEDICATIONS PRESCRIBED THIS ENCOUNTER: No orders of the defined types were placed in this encounter.    THERAPY PLAN:  Continue monthly B12 replacement IM.  We will continue to monitor iron studies and replace as indicated.  All questions were answered. The patient knows to call the clinic with any problems, questions or concerns. We can certainly see the patient much sooner if necessary.  Patient and plan discussed with Dr. Ancil Linsey and she is in agreement with the aforementioned.   This note is electronically signed by:  Doy Mince 10/18/2015 12:03 PM

## 2015-10-18 NOTE — Patient Instructions (Signed)
[facility] at [facility] Discharge Instructions  RECOMMENDATIONS MADE BY THE CONSULTANT AND ANY TEST RESULTS WILL BE SENT TO YOUR REFERRING PHYSICIAN.  B12 injection  Thank you for choosing [facility] at [facility] to provide your oncology and hematology care.  To afford each patient quality time with our provider, please arrive at least 15 minutes before your scheduled appointment time.   Beginning January 23rd 2017 lab work for the Cancer Center will be done in the  Main lab at [facility] on 1st floor. If you have a lab appointment with the Cancer Center please come in thru the  Main Entrance and check in at the main information desk  You need to re-schedule your appointment should you arrive 10 or more minutes late.  We strive to give you quality time with our providers, and arriving late affects you and other patients whose appointments are after yours.  Also, if you no show three or more times for appointments you may be dismissed from the clinic at the providers discretion.     Again, thank you for choosing [facility].  Our hope is that these requests will decrease the amount of time that you wait before being seen by our physicians.       _____________________________________________________________  Should you have questions after your visit to [facility], please contact our office at [phone] between the hours of 8:30 a.m. and 4:30 p.m.  Voicemails left after 4:30 p.m. will not be returned until the following business day.  For prescription refill requests, have your pharmacy contact our office.         Resources For Cancer Patients and their Caregivers ? American Cancer Society: Can assist with transportation, wigs, general needs, runs Look Good Feel Better.        1-888-227-6333 ? Cancer Care: Provides financial assistance, online support groups, medication/co-pay assistance.  1-800-813-HOPE  (4673) ? [facility] Assists [place] cancer patients and their families through emotional , educational and financial support.  336-427-4357 ? [place] DSS Where to apply for food stamps, Medicaid and utility assistance. 336-342-1394 ? RCATS: Transportation to medical appointments. 336-347-2287 ? Social Security Administration: May apply for disability if have a Stage IV cancer. 336-342-7796 1-800-772-1213 ? [place] Aging, Disability and Transit Services: Assists with nutrition, care and transit needs. 336-349-2343  Cancer Center Support Programs: @10RELATIVEDAYS@ > Cancer Support Group  2nd Tuesday of the month 1pm-2pm, Journey Room  > Creative Journey  3rd Tuesday of the month 1130am-1pm, Journey Room  > Look Good Feel Better  1st Wednesday of the month 10am-12 noon, Journey Room (Call American Cancer [employer] to register 1-800-395-5775)   

## 2015-10-18 NOTE — Assessment & Plan Note (Addendum)
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  Camera Capsule study was performed on 06/02/2015 demonstrating occasional erosion in duodenum, no masses, ulcers, or AVMs, and occasional lymphangectasia.  Oncology Flowsheet 08/04/2015  ferumoxytol [facility]) IV 510 mg    Labs today: CBC diff, iron/TIBC, ferritin.  I personally reviewed and went over laboratory results with the patient.  The results are noted within this dictation.  Hemoglobin is stable to slightly improved at 10.6 g/dL today. Microcytosis persists.  Iron studies are pending at this time.  Alpha thalassemia DNA analysis was performed on 08/17/2015. This demonstrated mutations identified at alpha 3. 7 and alpha 3. 7. 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  Individuals with alpha-0-thalassemia trait can demonstrate microcytosis, hypochromia, and normal levels of HbA2 and HbF.  However, they typically had no significant clinical findings. Additionally, this individual is negative for the Hemoglobin Constant Spring mutation. The presence of other non-deletion of mutations, likely mutations, and the alpha-globin gene cannot be excluded as the assay does not detect these changes.  Cohen inheritance of alpha-thalassemia and other hemoglobinopathies, such as beta-thalassemia in sickle cell disease is possible. Genetic counseling hemoglobinopathy testing are recommended for this individual's reproductive partner and family members. Alpha-thalassemia is an autosomal recessive condition the results from a deletion or dysfunction of alpha-globin  chains. There are four genes that make the alpha-globin chain, which binds with the beta-globin chain to create the alpha alpha/beta beta hemoglobin tetramer.  An imbalance of alpha and beta chains can lead to disease, ranging from mild anemia to fatal hydrops fetalis. Hemoglobin Constant Spring is a non-deletion of alpha thalassemia mutation that is more common in [place]. Hemoglobin Constant Spring is caused by mutation in the stop codon of the alpha(2)-globin gene that results in poor alpha-globin production.    She is provided a booklet with patient information regarding Alpha-0-Thalassemia trait.  She is educated that we will need to monitor her iron studies as those with alpha thalassemia can become iron deficient.  She notes an evaluation for right knee pain by Dr. Aline Brochure.  She received a Depo-Medrol 40 mg, 3 mL 1% lidocaine injection.  This resulted in an ecchymosis that is nearly resolved at this time.  She notes that she was referred to physical therapy which she did not comply with.  Labs in 4 months: CBC diff, iron/TIBC, ferritin.  Return in 4 months for follow-up.  If all is well in 4 months, it would be reasonable to decrease frequency of appointments and or lab appointments potentially.  She may benefit from a genetic counseling referral and this can be discussed at her follow-up appointment.

## 2015-10-18 NOTE — Assessment & Plan Note (Addendum)
Significant B12 deficiency with negative intrinsic factor antibody testing on 06/26/2015.  Now on B12 IM replacement therapy.  Oncology Flowsheet 06/26/2015 07/03/2015 07/10/2015  cyanocobalamin ((VITAMIN B-12)) IM 1,000 mcg 1,000 mcg 1,000 mcg   Oncology Flowsheet 08/04/2015 08/17/2015 09/18/2015  cyanocobalamin ((VITAMIN B-12)) IM 1,000 mcg 1,000 mcg 1,000 mcg   B12 injection today.  Although her intrinsic factor antibody testing is negative, given her significant B12 deficiency, I am hesitant to try PO B12 replacement at this time.  I recommend continued IM replacement given its cost effectiveness and improved absorption compared to oral replacement.

## 2015-10-19 ENCOUNTER — Encounter (HOSPITAL_COMMUNITY): Payer: Commercial Managed Care - HMO

## 2015-10-24 ENCOUNTER — Ambulatory Visit (HOSPITAL_COMMUNITY): Payer: Commercial Managed Care - HMO

## 2015-10-25 DIAGNOSIS — E559 Vitamin D deficiency, unspecified: Secondary | ICD-10-CM | POA: Diagnosis not present

## 2015-10-25 DIAGNOSIS — Z794 Long term (current) use of insulin: Secondary | ICD-10-CM | POA: Diagnosis not present

## 2015-10-25 DIAGNOSIS — I10 Essential (primary) hypertension: Secondary | ICD-10-CM | POA: Diagnosis not present

## 2015-10-25 DIAGNOSIS — E785 Hyperlipidemia, unspecified: Secondary | ICD-10-CM | POA: Diagnosis not present

## 2015-10-25 DIAGNOSIS — E119 Type 2 diabetes mellitus without complications: Secondary | ICD-10-CM | POA: Diagnosis not present

## 2015-10-26 LAB — LIPID PANEL
CHOL/HDL RATIO: 3.5 ratio (ref <=5.0)
Cholesterol: 179 mg/dL (ref 125–200)
HDL: 51 mg/dL (ref >=46)
LDL CALC: 106 mg/dL (ref <130)
TRIGLYCERIDES: 108 mg/dL (ref <150)
VLDL: 22 mg/dL (ref <30)

## 2015-10-26 LAB — HEMOGLOBIN A1C
Hgb A1c MFr Bld: 7.8 % — ABNORMAL HIGH (ref <5.7)
Mean Plasma Glucose: 177 mg/dL

## 2015-10-26 LAB — COMPLETE METABOLIC PANEL WITH GFR
ALBUMIN: 3.5 g/dL — AB (ref 3.6–5.1)
ALK PHOS: 109 U/L (ref 33–130)
ALT: 15 U/L (ref 6–29)
AST: 15 U/L (ref 10–35)
BILIRUBIN TOTAL: 0.3 mg/dL (ref 0.2–1.2)
BUN: 17 mg/dL (ref 7–25)
CALCIUM: 8.7 mg/dL (ref 8.6–10.4)
CO2: 21 mmol/L (ref 20–31)
Chloride: 104 mmol/L (ref 98–110)
Creat: 0.82 mg/dL (ref 0.50–0.99)
GFR, EST AFRICAN AMERICAN: 87 mL/min (ref >=60)
GFR, Est Non African American: 75 mL/min (ref >=60)
Glucose, Bld: 115 mg/dL — ABNORMAL HIGH (ref 65–99)
POTASSIUM: 4.3 mmol/L (ref 3.5–5.3)
SODIUM: 139 mmol/L (ref 135–146)
TOTAL PROTEIN: 6.7 g/dL (ref 6.1–8.1)

## 2015-10-26 LAB — VITAMIN D 25 HYDROXY (VIT D DEFICIENCY, FRACTURES): Vit D, 25-Hydroxy: 49 ng/mL (ref 30–100)

## 2015-10-26 LAB — TSH: TSH: 3.02 m[IU]/L

## 2015-10-30 ENCOUNTER — Encounter: Payer: Self-pay | Admitting: Family Medicine

## 2015-10-30 ENCOUNTER — Ambulatory Visit (INDEPENDENT_AMBULATORY_CARE_PROVIDER_SITE_OTHER): Payer: Commercial Managed Care - HMO | Admitting: Family Medicine

## 2015-10-30 ENCOUNTER — Other Ambulatory Visit: Payer: Self-pay

## 2015-10-30 VITALS — BP 96/60 | HR 100 | Resp 18 | Ht 66.0 in | Wt 251.0 lb

## 2015-10-30 DIAGNOSIS — I10 Essential (primary) hypertension: Secondary | ICD-10-CM | POA: Diagnosis not present

## 2015-10-30 DIAGNOSIS — E119 Type 2 diabetes mellitus without complications: Secondary | ICD-10-CM | POA: Diagnosis not present

## 2015-10-30 DIAGNOSIS — Z0000 Encounter for general adult medical examination without abnormal findings: Secondary | ICD-10-CM | POA: Diagnosis not present

## 2015-10-30 DIAGNOSIS — Z794 Long term (current) use of insulin: Secondary | ICD-10-CM

## 2015-10-30 DIAGNOSIS — IMO0001 Reserved for inherently not codable concepts without codable children: Secondary | ICD-10-CM

## 2015-10-30 MED ORDER — PRAVASTATIN SODIUM 80 MG PO TABS: ORAL_TABLET | ORAL | Status: DC

## 2015-10-30 MED ORDER — INSULIN GLARGINE 100 UNIT/ML SOLOSTAR PEN: PEN_INJECTOR | SUBCUTANEOUS | Status: DC

## 2015-10-30 MED ORDER — METFORMIN HCL 1000 MG PO TABS: 1000.0000 mg | ORAL_TABLET | Freq: Two times a day (BID) | ORAL | Status: DC

## 2015-10-30 NOTE — Progress Notes (Signed)
Subjective:    Patient ID: Helen Mahoney, female    DOB: 1950-12-30, 65 y.o.   MRN: [id number]  HPI Preventive Screening-Counseling & Management   Patient present here today for a Medicare annual wellness visit. C/o "feeling weak" x 1 day, states eating and drinking regularly   Current Problems (verified)   Medications Prior to Visit Allergies (verified)   PAST HISTORY  Family History (updated)   Social History Married x 45 years with 2 grown sons;  Previously worked in the [employer]  Current exercise habits:  Not currently but given handout on chair exercises   Dietary issues discussed: Eats out more than meals cooked at home;  Encouraged increase in fresh fruits and vegetables    Cardiac risk factors:   Depression Screen  (Note: if answer to either of the following is "Yes", a more complete depression screening is indicated)   Over the past two weeks, have you felt down, depressed or hopeless? No  Over the past two weeks, have you felt little interest or pleasure in doing things? No  Have you lost interest or pleasure in daily life? No  Do you often feel hopeless? No  Do you cry easily over simple problems? No   Activities of Daily Living  In your present state of health, do you have any difficulty performing the following activities?  Driving?: No Managing money?: No Feeding yourself?:No Getting from bed to chair?:No Climbing a flight of stairs?:No Preparing food and eating?:No Bathing or showering?:No Getting dressed?:No Getting to the toilet?:No Using the toilet?:No Moving around from place to place?: No  Fall Risk Assessment In the past year have you fallen or had a near fall?:No Are you currently taking any medications that make you dizzy?:No   Hearing Difficulties: No Do you often ask people to speak up or repeat themselves?:No Do you experience ringing or noises in your ears?:No Do you have difficulty  understanding soft or whispered voices?:No  Cognitive Testing  Alert? Yes Normal Appearance?Yes  Oriented to person? Yes Place? Yes  Time? Yes  Displays appropriate judgment?Yes  Can read the correct time from a watch face? yes Are you having problems remembering things?No age appropriate   Advanced Directives have been discussed with the patient?Yes and brochure provided , full code   List the Names of Other Physician/Practitioners you currently use: care teams up to date    Indicate any recent Medical Services you may have received from other than [facility] providers in the past year (date may be approximate).   Assessment:    Annual Wellness Exam   Plan:        Patient Instructions (the written plan) was given to the patient.  Medicare Attestation  I have personally reviewed:  The patient's medical and social history  Their use of alcohol, tobacco or illicit drugs  Their current medications and supplements  The patient's functional ability including ADLs,fall risks, home safety risks, cognitive, and hearing and visual impairment  Diet and physical activities  Evidence for depression or mood disorders  The patient's weight, height, BMI, and visual acuity have been recorded in the chart. I have made referrals, counseling, and provided education to the patient based on review of the above and I have provided the patient with a written personalized care plan for preventive services.      Review of Systems     Objective:   Physical Exam  BP 96/60 mmHg  Pulse 100  Resp  18  Ht 5\' 6"  (1.676 m)  Wt 251 lb (113.853 kg)  BMI 40.53 kg/m2  SpO2 98%       Assessment & Plan:  Welcome to Medicare preventive visit Annual exam as documented. Counseling done  re healthy lifestyle involving commitment to 150 minutes exercise per week, heart healthy diet, and attaining healthy weight.The importance of adequate sleep also discussed. Regular seat belt use and home safety, is  also discussed. Changes in health habits are decided on by the patient with goals and time frames  set for achieving them. Immunization and cancer screening needs are specifically addressed at this visit.   Essential hypertension Overcorrected, and pt symptomatic, feels weak. Discontinue benazepril and re evaluate in 4 weeks DASH diet and commitment to daily physical activity for a minimum of 30 minutes discussed and encouraged, as a part of hypertension management. The importance of attaining a healthy weight is also discussed.  BP/Weight 10/30/2015 10/18/2015 09/18/2015 09/11/2015 08/21/2015 08/04/2015 123XX123  Systolic BP 96 A999333 123XX123 [id number]  Diastolic BP 60 74 81 80 73 64 58  Wt. (Lbs) 251 257 - 254.12 255 - 250  BMI 40.53 41.5 - 41.04 41.18 - 40.37        Diabetes mellitus, insulin dependent (IDDM), controlled Not at goal, in crease lantus dose Helen Mahoney is reminded of the importance of commitment to daily physical activity for 30 minutes or more, as able and the need to limit carbohydrate intake to 30 to 60 grams per meal to help with blood sugar control.   The need to take medication as prescribed, test blood sugar as directed, and to call between visits if there is a concern that blood sugar is uncontrolled is also discussed.   Helen Mahoney is reminded of the importance of daily foot exam, annual eye examination, and good blood sugar, blood pressure and cholesterol control.  Diabetic Labs Latest Ref Rng 10/25/2015 06/27/2015 06/15/2015 03/31/2015 10/15/2014  HbA1c <5.7 % 7.8(H) 7.7(H) - 7.6(H) 7.4(H)  Microalbumin <2.0 mg/dL - - - - 1.1  Micro/Creat Ratio 0.0 - 30.0 mg/g - - - - 8.6  Chol 125 - 200 mg/dL 179 - - 148 157  HDL >=46 mg/dL 51 - - 44(L) 58  Calc LDL <130 mg/dL 106 - - 89 88  Triglycerides <150 mg/dL 108 - - 74 57  Creatinine 0.50 - 0.99 mg/dL 0.82 0.88 0.88 0.84 0.80   BP/Weight 10/30/2015 10/18/2015 09/18/2015 09/11/2015 08/21/2015 08/04/2015 123XX123  Systolic BP  96 A999333 [id number] [id number]  Diastolic BP 60 74 81 80 73 64 58  Wt. (Lbs) 251 257 - 254.12 255 - 250  BMI 40.53 41.5 - 41.04 41.18 - 40.37   Foot/eye exam completion dates Latest Ref Rng 04/04/2015 11/09/2014  Eye Exam No Retinopathy - No Retinopathy  Foot Form Completion - Done -

## 2015-10-30 NOTE — Patient Instructions (Addendum)
F/u  In 4 weeks re evaluate  blood pressure  STOP benazepril, I have discarded this  Go to the ED if you continue to feel"weak"  Ensure you drink 64 ounces water daily  Increase vegetable and reduce sweets  Increase lantus to 30 units daily  Please work on good  health habits so that your health will improve. 1. Commitment to daily physical activity for 30 to 60  minutes, if you are able to do this.  2. Commitment to wise food choices. Aim for half of your  food intake to be vegetable and fruit, one quarter starchy foods, and one quarter protein. Try to eat on a regular schedule  3 meals per day, snacking between meals should be limited to vegetables or fruits or small portions of nuts. 64 ounces of water per day is generally recommended, unless you have specific health conditions, like heart failure or kidney failure where you will need to limit fluid intake.  3. Commitment to sufficient and a  good quality of physical and mental rest daily, generally between 6 to 8 hours per day.  WITH PERSISTANCE AND PERSEVERANCE, THE IMPOSSIBLE , BECOMES THE NORM!

## 2015-10-31 DIAGNOSIS — Z0000 Encounter for general adult medical examination without abnormal findings: Secondary | ICD-10-CM | POA: Insufficient documentation

## 2015-10-31 NOTE — Assessment & Plan Note (Signed)

## 2015-10-31 NOTE — Assessment & Plan Note (Signed)
Not at goal, in crease lantus dose Helen Mahoney is reminded of the importance of commitment to daily physical activity for 30 minutes or more, as able and the need to limit carbohydrate intake to 30 to 60 grams per meal to help with blood sugar control.   The need to take medication as prescribed, test blood sugar as directed, and to call between visits if there is a concern that blood sugar is uncontrolled is also discussed.   Helen Mahoney is reminded of the importance of daily foot exam, annual eye examination, and good blood sugar, blood pressure and cholesterol control.  Diabetic Labs Latest Ref Rng 10/25/2015 06/27/2015 06/15/2015 03/31/2015 10/15/2014  HbA1c <5.7 % 7.8(H) 7.7(H) - 7.6(H) 7.4(H)  Microalbumin <2.0 mg/dL - - - - 1.1  Micro/Creat Ratio 0.0 - 30.0 mg/g - - - - 8.6  Chol 125 - 200 mg/dL 179 - - 148 157  HDL >=46 mg/dL 51 - - 44(L) 58  Calc LDL <130 mg/dL 106 - - 89 88  Triglycerides <150 mg/dL 108 - - 74 57  Creatinine 0.50 - 0.99 mg/dL 0.82 0.88 0.88 0.84 0.80   BP/Weight 10/30/2015 10/18/2015 09/18/2015 09/11/2015 08/21/2015 08/04/2015 123XX123  Systolic BP 96 A999333 123XX123 123456 [id number]  Diastolic BP 60 74 81 80 73 64 58  Wt. (Lbs) 251 257 - 254.12 255 - 250  BMI 40.53 41.5 - 41.04 41.18 - 40.37   Foot/eye exam completion dates Latest Ref Rng 04/04/2015 11/09/2014  Eye Exam No Retinopathy - No Retinopathy  Foot Form Completion - Done -

## 2015-10-31 NOTE — Assessment & Plan Note (Signed)
Overcorrected, and pt symptomatic, feels weak. Discontinue benazepril and re evaluate in 4 weeks DASH diet and commitment to daily physical activity for a minimum of 30 minutes discussed and encouraged, as a part of hypertension management. The importance of attaining a healthy weight is also discussed.  BP/Weight 10/30/2015 10/18/2015 09/18/2015 09/11/2015 08/21/2015 08/04/2015 123XX123  Systolic BP 96 A999333 123XX123 123456 [id number]  Diastolic BP 60 74 81 80 73 64 58  Wt. (Lbs) 251 257 - 254.12 255 - 250  BMI 40.53 41.5 - 41.04 41.18 - 40.37

## 2015-11-08 ENCOUNTER — Other Ambulatory Visit: Payer: Self-pay | Admitting: Family Medicine

## 2015-11-20 ENCOUNTER — Encounter (HOSPITAL_COMMUNITY): Payer: Commercial Managed Care - HMO | Attending: Hematology & Oncology

## 2015-11-20 VITALS — BP 125/77 | HR 108 | Temp 98.6°F | Resp 18

## 2015-11-20 DIAGNOSIS — Z9049 Acquired absence of other specified parts of digestive tract: Secondary | ICD-10-CM | POA: Insufficient documentation

## 2015-11-20 DIAGNOSIS — Z7982 Long term (current) use of aspirin: Secondary | ICD-10-CM | POA: Insufficient documentation

## 2015-11-20 DIAGNOSIS — Z880 Allergy status to penicillin: Secondary | ICD-10-CM | POA: Insufficient documentation

## 2015-11-20 DIAGNOSIS — Z9889 Other specified postprocedural states: Secondary | ICD-10-CM | POA: Insufficient documentation

## 2015-11-20 DIAGNOSIS — E119 Type 2 diabetes mellitus without complications: Secondary | ICD-10-CM | POA: Insufficient documentation

## 2015-11-20 DIAGNOSIS — E785 Hyperlipidemia, unspecified: Secondary | ICD-10-CM | POA: Insufficient documentation

## 2015-11-20 DIAGNOSIS — D509 Iron deficiency anemia, unspecified: Secondary | ICD-10-CM | POA: Insufficient documentation

## 2015-11-20 DIAGNOSIS — E538 Deficiency of other specified B group vitamins: Secondary | ICD-10-CM | POA: Insufficient documentation

## 2015-11-20 DIAGNOSIS — Z794 Long term (current) use of insulin: Secondary | ICD-10-CM | POA: Insufficient documentation

## 2015-11-20 DIAGNOSIS — Z79899 Other long term (current) drug therapy: Secondary | ICD-10-CM | POA: Insufficient documentation

## 2015-11-20 DIAGNOSIS — I10 Essential (primary) hypertension: Secondary | ICD-10-CM | POA: Insufficient documentation

## 2015-11-20 DIAGNOSIS — Z7984 Long term (current) use of oral hypoglycemic drugs: Secondary | ICD-10-CM | POA: Insufficient documentation

## 2015-11-20 DIAGNOSIS — Z86010 Personal history of colonic polyps: Secondary | ICD-10-CM | POA: Insufficient documentation

## 2015-11-20 DIAGNOSIS — Z888 Allergy status to other drugs, medicaments and biological substances status: Secondary | ICD-10-CM | POA: Insufficient documentation

## 2015-11-20 DIAGNOSIS — Z803 Family history of malignant neoplasm of breast: Secondary | ICD-10-CM | POA: Insufficient documentation

## 2015-11-20 DIAGNOSIS — Z808 Family history of malignant neoplasm of other organs or systems: Secondary | ICD-10-CM | POA: Insufficient documentation

## 2015-11-20 DIAGNOSIS — Z833 Family history of diabetes mellitus: Secondary | ICD-10-CM | POA: Insufficient documentation

## 2015-11-20 DIAGNOSIS — E669 Obesity, unspecified: Secondary | ICD-10-CM | POA: Insufficient documentation

## 2015-11-20 DIAGNOSIS — Z823 Family history of stroke: Secondary | ICD-10-CM | POA: Insufficient documentation

## 2015-11-20 MED ORDER — CYANOCOBALAMIN 1000 MCG/ML IJ SOLN
1000.0000 ug | Freq: Once | INTRAMUSCULAR | Status: AC
  Administered 2015-11-20: 1000 ug via INTRAMUSCULAR
  Filled 2015-11-20: qty 1

## 2015-11-20 NOTE — Progress Notes (Signed)
Helen Mahoney presents today for injection per MD orders. B12 1000 mcg administered IM in left Upper Arm. Administration without incident. Patient tolerated well.  

## 2015-11-27 ENCOUNTER — Encounter: Payer: Self-pay | Admitting: Family Medicine

## 2015-11-27 ENCOUNTER — Encounter (INDEPENDENT_AMBULATORY_CARE_PROVIDER_SITE_OTHER): Payer: Self-pay

## 2015-11-27 ENCOUNTER — Ambulatory Visit (INDEPENDENT_AMBULATORY_CARE_PROVIDER_SITE_OTHER): Payer: Commercial Managed Care - HMO | Admitting: Family Medicine

## 2015-11-27 ENCOUNTER — Other Ambulatory Visit (HOSPITAL_COMMUNITY)
Admission: RE | Admit: 2015-11-27 | Discharge: 2015-11-27 | Disposition: A | Discharge status: Home / Self Care | Payer: Commercial Managed Care - HMO | Source: Other Acute Inpatient Hospital | Attending: Internal Medicine | Admitting: Internal Medicine

## 2015-11-27 VITALS — BP 120/82 | HR 94 | Resp 16 | Ht 66.0 in | Wt 255.8 lb

## 2015-11-27 DIAGNOSIS — E119 Type 2 diabetes mellitus without complications: Secondary | ICD-10-CM | POA: Diagnosis not present

## 2015-11-27 DIAGNOSIS — Z13820 Encounter for screening for osteoporosis: Secondary | ICD-10-CM | POA: Diagnosis not present

## 2015-11-27 DIAGNOSIS — E785 Hyperlipidemia, unspecified: Secondary | ICD-10-CM

## 2015-11-27 DIAGNOSIS — IMO0001 Reserved for inherently not codable concepts without codable children: Secondary | ICD-10-CM

## 2015-11-27 DIAGNOSIS — Z794 Long term (current) use of insulin: Secondary | ICD-10-CM

## 2015-11-27 DIAGNOSIS — I10 Essential (primary) hypertension: Secondary | ICD-10-CM

## 2015-11-27 DIAGNOSIS — E559 Vitamin D deficiency, unspecified: Secondary | ICD-10-CM

## 2015-11-27 DIAGNOSIS — D509 Iron deficiency anemia, unspecified: Secondary | ICD-10-CM

## 2015-11-27 NOTE — Assessment & Plan Note (Signed)
Normal and on no medciation, will not reintroduce even a low dose ACE at this time DASH diet and commitment to daily physical activity for a minimum of 30 minutes discussed and encouraged, as a part of hypertension management. The importance of attaining a healthy weight is also discussed.  BP/Weight 11/27/2015 11/20/2015 10/30/2015 10/18/2015 09/18/2015 09/11/2015 [id number]  Systolic BP 123456 0000000 96 A999333 [id number] 123456 [id number]  Diastolic BP 82 77 60 74 81 80 73  Wt. (Lbs) 255.8 - 251 257 - 254.12 255  BMI 41.29 - 40.53 41.5 - 41.04 41.18

## 2015-11-27 NOTE — Assessment & Plan Note (Signed)
Deteriorated and sub optimal control Helen Mahoney is reminded of the importance of commitment to daily physical activity for 30 minutes or more, as able and the need to limit carbohydrate intake to 30 to 60 grams per meal to help with blood sugar control.   The need to take medication as prescribed, test blood sugar as directed, and to call between visits if there is a concern that blood sugar is uncontrolled is also discussed.   Helen Mahoney is reminded of the importance of daily foot exam, annual eye examination, and good blood sugar, blood pressure and cholesterol control.  Diabetic Labs Latest Ref Rng & Units 10/25/2015 06/27/2015 06/15/2015 03/31/2015 10/15/2014  HbA1c <5.7 % 7.8(H) 7.7(H) - 7.6(H) 7.4(H)  Microalbumin <2.0 mg/dL - - - - 1.1  Micro/Creat Ratio 0.0 - 30.0 mg/g - - - - 8.6  Chol 125 - 200 mg/dL 179 - - 148 157  HDL >=46 mg/dL 51 - - 44(L) 58  Calc LDL <130 mg/dL 106 - - 89 88  Triglycerides <150 mg/dL 108 - - 74 57  Creatinine 0.50 - 0.99 mg/dL 0.82 0.88 0.88 0.84 0.80   BP/Weight 11/27/2015 11/20/2015 10/30/2015 10/18/2015 09/18/2015 09/11/2015 [id number]  Systolic BP 123456 0000000 96 A999333 123XX123 123456 [id number]  Diastolic BP 82 77 60 74 81 80 73  Wt. (Lbs) 255.8 - 251 257 - 254.12 255  BMI 41.29 - 40.53 41.5 - 41.04 41.18   Foot/eye exam completion dates Latest Ref Rng & Units 04/04/2015 11/09/2014  Eye Exam No Retinopathy - No Retinopathy  Foot exam Order - - -  Foot Form Completion - Done -      Updated lab needed at/ before next visit.

## 2015-11-27 NOTE — Assessment & Plan Note (Signed)
Deteriorated. Patient re-educated about  the importance of commitment to a  minimum of 150 minutes of exercise per week.  The importance of healthy food choices with portion control discussed. Encouraged to start a food diary, count calories and to consider  joining a support group. Sample diet sheets offered. Goals set by the patient for the next several months.   Weight /BMI 11/27/2015 10/30/2015 10/18/2015  WEIGHT 255 lb 12.8 oz 251 lb 257 lb  HEIGHT 5\' 6"  5\' 6"  -  BMI 41.29 kg/m2 40.53 kg/m2 41.5 kg/m2

## 2015-11-27 NOTE — Assessment & Plan Note (Signed)
Adequately corrected, continue weekly supplement

## 2015-11-27 NOTE — Assessment & Plan Note (Signed)
Not at goal Hyperlipidemia:Low fat diet discussed and encouraged.   Lipid Panel  Lab Results  Component Value Date   CHOL 179 10/25/2015   HDL 51 10/25/2015   LDLCALC 106 10/25/2015   TRIG 108 10/25/2015   CHOLHDL 3.5 10/25/2015     Updated lab needed at/ before next visit.

## 2015-11-27 NOTE — Assessment & Plan Note (Signed)
Treated by hematology

## 2015-11-27 NOTE — Progress Notes (Signed)
Helen Mahoney     MRN: [id number]      DOB: 1950-12-15   HPI Helen Mahoney is here for follow up and re-evaluation of chronic medical conditions,in particular hypertension, as had recent medication change, as she was over corrected and symptomatioc medication management and review of any available recent lab and radiology data.  Preventive health is updated, specifically  Cancer screening and Immunization.   Questions or concerns regarding consultations or procedures which the PT has had in the interim are  addressed. The PT denies any adverse reactions to current medications since the last visit.  Has had dose increase in insulin, states FBG generally under 130, no exercise commitment still There are no new concerns.  There are no specific complaints   ROS Denies recent fever or chills. Denies sinus pressure, nasal congestion, ear pain or sore throat. Denies chest congestion, productive cough or wheezing. Denies chest pains, palpitations and leg swelling Denies abdominal pain, nausea, vomiting,diarrhea or constipation.   Denies dysuria, frequency, hesitancy or incontinence. Denies joint pain, swelling and limitation in mobility. Denies headaches, seizures, numbness, or tingling. Denies depression, anxiety or insomnia. Denies skin break down or rash.   PE  BP 120/82 (BP Location: Left Arm, Patient Position: Sitting, Cuff Size: Large)   Pulse 94   Resp 16   Ht 5\' 6"  (1.676 m)   Wt 255 lb 12.8 oz (116 kg)   SpO2 98%   BMI 41.29 kg/m   Patient alert and oriented and in no cardiopulmonary distress.  HEENT: No facial asymmetry, EOMI,   oropharynx pink and moist.  Neck supple no JVD, no mass.  Chest: Clear to auscultation bilaterally.  CVS: S1, S2 no murmurs, no S3.Regular rate.  ABD: Soft non tender.   Ext: No edema  MS: Adequate ROM spine, shoulders, hips and knees.  Skin: Intact, no ulcerations or rash noted.  Psych: Good eye contact, normal affect. Memory  intact not anxious or depressed appearing.  CNS: CN 2-12 intact, power,  normal throughout.no focal deficits noted.   Assessment & Plan  Essential hypertension Normal and on no medciation, will not reintroduce even a low dose ACE at this time DASH diet and commitment to daily physical activity for a minimum of 30 minutes discussed and encouraged, as a part of hypertension management. The importance of attaining a healthy weight is also discussed.  BP/Weight 11/27/2015 11/20/2015 10/30/2015 10/18/2015 09/18/2015 09/11/2015 [id number]  Systolic BP 123456 0000000 96 A999333 123XX123 123456 [id number]  Diastolic BP 82 77 60 74 81 80 73  Wt. (Lbs) 255.8 - 251 257 - 254.12 255  BMI 41.29 - 40.53 41.5 - 41.04 41.18       Diabetes mellitus, insulin dependent (IDDM), controlled Deteriorated and sub optimal control Helen Mahoney is reminded of the importance of commitment to daily physical activity for 30 minutes or more, as able and the need to limit carbohydrate intake to 30 to 60 grams per meal to help with blood sugar control.   The need to take medication as prescribed, test blood sugar as directed, and to call between visits if there is a concern that blood sugar is uncontrolled is also discussed.   Helen Mahoney is reminded of the importance of daily foot exam, annual eye examination, and good blood sugar, blood pressure and cholesterol control.  Diabetic Labs Latest Ref Rng & Units 10/25/2015 06/27/2015 06/15/2015 03/31/2015 10/15/2014  HbA1c <5.7 % 7.8(H) 7.7(H) - 7.6(H) 7.4(H)  Microalbumin <2.0 mg/dL - - - - 1.1  Micro/Creat Ratio 0.0 - 30.0 mg/g - - - - 8.6  Chol 125 - 200 mg/dL 179 - - 148 157  HDL >=46 mg/dL 51 - - 44(L) 58  Calc LDL <130 mg/dL 106 - - 89 88  Triglycerides <150 mg/dL 108 - - 74 57  Creatinine 0.50 - 0.99 mg/dL 0.82 0.88 0.88 0.84 0.80   BP/Weight 11/27/2015 11/20/2015 10/30/2015 10/18/2015 09/18/2015 09/11/2015 [id number]  Systolic BP 123456 0000000 96 A999333 [id number] 123456 [id number]  Diastolic BP 82 77 60 74 81 80 73  Wt.  (Lbs) 255.8 - 251 257 - 254.12 255  BMI 41.29 - 40.53 41.5 - 41.04 41.18   Foot/eye exam completion dates Latest Ref Rng & Units 04/04/2015 11/09/2014  Eye Exam No Retinopathy - No Retinopathy  Foot exam Order - - -  Foot Form Completion - Done -      Updated lab needed at/ before next visit.   Morbid obesity Deteriorated. Patient re-educated about  the importance of commitment to a  minimum of 150 minutes of exercise per week.  The importance of healthy food choices with portion control discussed. Encouraged to start a food diary, count calories and to consider  joining a support group. Sample diet sheets offered. Goals set by the patient for the next several months.   Weight /BMI 11/27/2015 10/30/2015 10/18/2015  WEIGHT 255 lb 12.8 oz 251 lb 257 lb  HEIGHT 5\' 6"  5\' 6"  -  BMI 41.29 kg/m2 40.53 kg/m2 41.5 kg/m2      Vitamin D deficiency Adequately corrected, continue weekly supplement  Hyperlipidemia LDL goal <100 Not at goal Hyperlipidemia:Low fat diet discussed and encouraged.   Lipid Panel  Lab Results  Component Value Date   CHOL 179 10/25/2015   HDL 51 10/25/2015   LDLCALC 106 10/25/2015   TRIG 108 10/25/2015   CHOLHDL 3.5 10/25/2015     Updated lab needed at/ before next visit.   IDA (iron deficiency anemia) Treated by hematology

## 2015-11-27 NOTE — Patient Instructions (Addendum)
Annual physical exam Dec 7 or after, call if you need me sooner  Microalb today  Pls reduce fried and fatty foods and carbs and sweets, and start daily exercise, bad cholesterol and blood sugar are  too high  You are referred for eye exam and bone density test  Thank you  for choosing [facility]. We consider it a privelige to serve you.  Delivering excellent health care in a caring and  compassionate way is our goal.  Partnering with you,  so that together we can achieve this goal is our strategy.

## 2015-11-28 LAB — MICROALBUMIN / CREATININE URINE RATIO
Creatinine, Urine: 128.2 mg/dL
MICROALB UR: 31.5 ug/mL — AB
MICROALB/CREAT RATIO: 24.6 mg/g{creat} (ref 0.0–30.0)

## 2015-12-06 DIAGNOSIS — H2513 Age-related nuclear cataract, bilateral: Secondary | ICD-10-CM | POA: Diagnosis not present

## 2015-12-06 DIAGNOSIS — Z794 Long term (current) use of insulin: Secondary | ICD-10-CM | POA: Diagnosis not present

## 2015-12-06 DIAGNOSIS — E119 Type 2 diabetes mellitus without complications: Secondary | ICD-10-CM | POA: Diagnosis not present

## 2015-12-06 LAB — HM DIABETES EYE EXAM

## 2015-12-13 ENCOUNTER — Other Ambulatory Visit: Payer: Self-pay | Admitting: Family Medicine

## 2015-12-21 ENCOUNTER — Encounter (HOSPITAL_COMMUNITY): Payer: Commercial Managed Care - HMO | Attending: Hematology & Oncology

## 2015-12-21 VITALS — BP 111/67 | HR 93 | Temp 98.6°F | Resp 18

## 2015-12-21 DIAGNOSIS — Z803 Family history of malignant neoplasm of breast: Secondary | ICD-10-CM | POA: Insufficient documentation

## 2015-12-21 DIAGNOSIS — Z9889 Other specified postprocedural states: Secondary | ICD-10-CM | POA: Insufficient documentation

## 2015-12-21 DIAGNOSIS — Z86010 Personal history of colonic polyps: Secondary | ICD-10-CM | POA: Insufficient documentation

## 2015-12-21 DIAGNOSIS — E538 Deficiency of other specified B group vitamins: Secondary | ICD-10-CM | POA: Diagnosis not present

## 2015-12-21 DIAGNOSIS — Z888 Allergy status to other drugs, medicaments and biological substances status: Secondary | ICD-10-CM | POA: Insufficient documentation

## 2015-12-21 DIAGNOSIS — E119 Type 2 diabetes mellitus without complications: Secondary | ICD-10-CM | POA: Insufficient documentation

## 2015-12-21 DIAGNOSIS — Z823 Family history of stroke: Secondary | ICD-10-CM | POA: Insufficient documentation

## 2015-12-21 DIAGNOSIS — D509 Iron deficiency anemia, unspecified: Secondary | ICD-10-CM | POA: Insufficient documentation

## 2015-12-21 DIAGNOSIS — Z794 Long term (current) use of insulin: Secondary | ICD-10-CM | POA: Insufficient documentation

## 2015-12-21 DIAGNOSIS — Z833 Family history of diabetes mellitus: Secondary | ICD-10-CM | POA: Insufficient documentation

## 2015-12-21 DIAGNOSIS — Z79899 Other long term (current) drug therapy: Secondary | ICD-10-CM | POA: Insufficient documentation

## 2015-12-21 DIAGNOSIS — Z9049 Acquired absence of other specified parts of digestive tract: Secondary | ICD-10-CM | POA: Insufficient documentation

## 2015-12-21 DIAGNOSIS — I10 Essential (primary) hypertension: Secondary | ICD-10-CM | POA: Insufficient documentation

## 2015-12-21 DIAGNOSIS — Z7984 Long term (current) use of oral hypoglycemic drugs: Secondary | ICD-10-CM | POA: Insufficient documentation

## 2015-12-21 DIAGNOSIS — Z808 Family history of malignant neoplasm of other organs or systems: Secondary | ICD-10-CM | POA: Insufficient documentation

## 2015-12-21 DIAGNOSIS — Z880 Allergy status to penicillin: Secondary | ICD-10-CM | POA: Insufficient documentation

## 2015-12-21 DIAGNOSIS — E785 Hyperlipidemia, unspecified: Secondary | ICD-10-CM | POA: Insufficient documentation

## 2015-12-21 DIAGNOSIS — Z7982 Long term (current) use of aspirin: Secondary | ICD-10-CM | POA: Insufficient documentation

## 2015-12-21 DIAGNOSIS — E669 Obesity, unspecified: Secondary | ICD-10-CM | POA: Insufficient documentation

## 2015-12-21 MED ORDER — CYANOCOBALAMIN 1000 MCG/ML IJ SOLN
INTRAMUSCULAR | Status: AC
  Filled 2015-12-21: qty 1

## 2015-12-21 MED ORDER — CYANOCOBALAMIN 1000 MCG/ML IJ SOLN
1000.0000 ug | Freq: Once | INTRAMUSCULAR | Status: AC
  Administered 2015-12-21: 1000 ug via INTRAMUSCULAR

## 2015-12-21 NOTE — Progress Notes (Signed)
Helen Mahoney presents today for injection per MD orders. B12 1000mcg administered IM in right Upper Arm. Administration without incident. Patient tolerated well.  

## 2015-12-21 NOTE — Patient Instructions (Signed)
[place] at [facility] Discharge Instructions  RECOMMENDATIONS MADE BY THE CONSULTANT AND ANY TEST RESULTS WILL BE SENT TO YOUR REFERRING PHYSICIAN.  Vitamin B12 1000 mcg injection given as ordered.  Thank you for choosing [place] at [facility] to provide your oncology and hematology care.  To afford each patient quality time with our provider, please arrive at least 15 minutes before your scheduled appointment time.   Beginning January 23rd 2017 lab work for the [facility] will be done in the  Main lab at [facility] on 1st floor. If you have a lab appointment with the [place] please come in thru the  Main Entrance and check in at the main information desk  You need to re-schedule your appointment should you arrive 10 or more minutes late.  We strive to give you quality time with our providers, and arriving late affects you and other patients whose appointments are after yours.  Also, if you no show three or more times for appointments you may be dismissed from the clinic at the providers discretion.     Again, thank you for choosing [facility].  Our hope is that these requests will decrease the amount of time that you wait before being seen by our physicians.       _____________________________________________________________  Should you have questions after your visit to [facility], please contact our office at [phone] between the hours of 8:30 a.m. and 4:30 p.m.  Voicemails left after 4:30 p.m. will not be returned until the following business day.  For prescription refill requests, have your pharmacy contact our office.         Resources For Cancer Patients and their Caregivers ? American Cancer Society: Can assist with transportation, wigs, general needs, runs Look Good Feel Better.        365-800-1728 ? Cancer Care: Provides financial assistance, online support groups,  medication/co-pay assistance.  1-800-813-HOPE (808) 166-9591) ? [place] Assists [place] cancer patients and their families through emotional , educational and financial support.  9286917335 ? [place] DSS Where to apply for food stamps, Medicaid and utility assistance. 470-712-2930 ? RCATS: Transportation to medical appointments. (857)474-1394 ? Social Security Administration: May apply for disability if have a Stage IV cancer. 986 749 6033 978-595-2862 ? [employer], Disability and Transit Services: Assists with nutrition, care and transit needs. [place] Support Programs: @10RELATIVEDAYS @ > Cancer Support Group  2nd Tuesday of the month 1pm-2pm, Journey Room  > Creative Journey  3rd Tuesday of the month 1130am-1pm, Journey Room  > Look Good Feel Better  1st Wednesday of the month 10am-12 noon, Journey Room (Call [place] to register [phone])

## 2016-01-22 ENCOUNTER — Encounter (HOSPITAL_COMMUNITY): Payer: Self-pay

## 2016-01-22 ENCOUNTER — Encounter (HOSPITAL_COMMUNITY): Payer: Commercial Managed Care - HMO | Attending: Hematology & Oncology

## 2016-01-22 ENCOUNTER — Ambulatory Visit (HOSPITAL_COMMUNITY): Payer: Commercial Managed Care - HMO

## 2016-01-22 ENCOUNTER — Ambulatory Visit (HOSPITAL_COMMUNITY)
Admission: RE | Admit: 2016-01-22 | Discharge: 2016-01-22 | Disposition: A | Discharge status: Home / Self Care | Payer: Commercial Managed Care - HMO | Source: Ambulatory Visit | Attending: Family Medicine | Admitting: Family Medicine

## 2016-01-22 ENCOUNTER — Other Ambulatory Visit: Payer: Self-pay | Admitting: Family Medicine

## 2016-01-22 VITALS — BP 130/67 | HR 92 | Temp 98.4°F | Resp 18

## 2016-01-22 DIAGNOSIS — Z1231 Encounter for screening mammogram for malignant neoplasm of breast: Secondary | ICD-10-CM

## 2016-01-22 DIAGNOSIS — Z9049 Acquired absence of other specified parts of digestive tract: Secondary | ICD-10-CM | POA: Insufficient documentation

## 2016-01-22 DIAGNOSIS — Z86010 Personal history of colonic polyps: Secondary | ICD-10-CM | POA: Insufficient documentation

## 2016-01-22 DIAGNOSIS — Z79899 Other long term (current) drug therapy: Secondary | ICD-10-CM | POA: Insufficient documentation

## 2016-01-22 DIAGNOSIS — Z9889 Other specified postprocedural states: Secondary | ICD-10-CM | POA: Insufficient documentation

## 2016-01-22 DIAGNOSIS — Z808 Family history of malignant neoplasm of other organs or systems: Secondary | ICD-10-CM | POA: Insufficient documentation

## 2016-01-22 DIAGNOSIS — E119 Type 2 diabetes mellitus without complications: Secondary | ICD-10-CM | POA: Insufficient documentation

## 2016-01-22 DIAGNOSIS — Z823 Family history of stroke: Secondary | ICD-10-CM | POA: Insufficient documentation

## 2016-01-22 DIAGNOSIS — Z7982 Long term (current) use of aspirin: Secondary | ICD-10-CM | POA: Insufficient documentation

## 2016-01-22 DIAGNOSIS — D509 Iron deficiency anemia, unspecified: Secondary | ICD-10-CM | POA: Insufficient documentation

## 2016-01-22 DIAGNOSIS — E669 Obesity, unspecified: Secondary | ICD-10-CM | POA: Insufficient documentation

## 2016-01-22 DIAGNOSIS — Z833 Family history of diabetes mellitus: Secondary | ICD-10-CM | POA: Insufficient documentation

## 2016-01-22 DIAGNOSIS — Z7984 Long term (current) use of oral hypoglycemic drugs: Secondary | ICD-10-CM | POA: Insufficient documentation

## 2016-01-22 DIAGNOSIS — E538 Deficiency of other specified B group vitamins: Secondary | ICD-10-CM | POA: Diagnosis not present

## 2016-01-22 DIAGNOSIS — Z794 Long term (current) use of insulin: Secondary | ICD-10-CM | POA: Insufficient documentation

## 2016-01-22 DIAGNOSIS — Z803 Family history of malignant neoplasm of breast: Secondary | ICD-10-CM | POA: Insufficient documentation

## 2016-01-22 DIAGNOSIS — Z880 Allergy status to penicillin: Secondary | ICD-10-CM | POA: Insufficient documentation

## 2016-01-22 DIAGNOSIS — E785 Hyperlipidemia, unspecified: Secondary | ICD-10-CM | POA: Insufficient documentation

## 2016-01-22 DIAGNOSIS — I10 Essential (primary) hypertension: Secondary | ICD-10-CM | POA: Insufficient documentation

## 2016-01-22 DIAGNOSIS — Z888 Allergy status to other drugs, medicaments and biological substances status: Secondary | ICD-10-CM | POA: Insufficient documentation

## 2016-01-22 MED ORDER — CYANOCOBALAMIN 1000 MCG/ML IJ SOLN
1000.0000 ug | Freq: Once | INTRAMUSCULAR | Status: AC
  Administered 2016-01-22: 1000 ug via INTRAMUSCULAR
  Filled 2016-01-22: qty 1

## 2016-01-22 MED ORDER — INFLUENZA VAC SPLIT QUAD 0.5 ML IM SUSY
PREFILLED_SYRINGE | INTRAMUSCULAR | Status: AC
  Filled 2016-01-22: qty 0.5

## 2016-01-22 NOTE — Progress Notes (Signed)
Helen Mahoney presents today for injection per the provider's orders.  B12 administration without incident; see MAR for injection details.  Patient tolerated procedure well and without incident.  No questions or complaints noted at this time.

## 2016-01-22 NOTE — Patient Instructions (Signed)
[place] at [facility] Discharge Instructions  RECOMMENDATIONS MADE BY THE CONSULTANT AND ANY TEST RESULTS WILL BE SENT TO YOUR REFERRING PHYSICIAN.  B12 injection today. Flu shot as planned with your PCP. Return as scheduled for B12 injections.   Thank you for choosing [place] at [facility] to provide your oncology and hematology care.  To afford each patient quality time with our provider, please arrive at least 15 minutes before your scheduled appointment time.   Beginning January 23rd 2017 lab work for the [facility] will be done in the  Main lab at [facility] on 1st floor. If you have a lab appointment with the [place] please come in thru the  Main Entrance and check in at the main information desk  You need to re-schedule your appointment should you arrive 10 or more minutes late.  We strive to give you quality time with our providers, and arriving late affects you and other patients whose appointments are after yours.  Also, if you no show three or more times for appointments you may be dismissed from the clinic at the providers discretion.     Again, thank you for choosing [facility].  Our hope is that these requests will decrease the amount of time that you wait before being seen by our physicians.       _____________________________________________________________  Should you have questions after your visit to [facility], please contact our office at [phone] between the hours of 8:30 a.m. and 4:30 p.m.  Voicemails left after 4:30 p.m. will not be returned until the following business day.  For prescription refill requests, have your pharmacy contact our office.         Resources For Cancer Patients and their Caregivers ? American Cancer Society: Can assist with transportation, wigs, general needs, runs Look Good Feel Better.        780-416-8773 ? Cancer Care: Provides financial  assistance, online support groups, medication/co-pay assistance.  1-800-813-HOPE (506)537-3955) ? [place] Assists [place] cancer patients and their families through emotional , educational and financial support.  7707529495 ? [place] DSS Where to apply for food stamps, Medicaid and utility assistance. 425-710-3144 ? RCATS: Transportation to medical appointments. 929-627-4232 ? Social Security Administration: May apply for disability if have a Stage IV cancer. 269-661-0163 803-271-5292 ? [employer], Disability and Transit Services: Assists with nutrition, care and transit needs. [place] Support Programs: @10RELATIVEDAYS @ > Cancer Support Group  2nd Tuesday of the month 1pm-2pm, Journey Room  > Creative Journey  3rd Tuesday of the month 1130am-1pm, Journey Room  > Look Good Feel Better  1st Wednesday of the month 10am-12 noon, Journey Room (Call [place] to register [phone])

## 2016-01-31 ENCOUNTER — Other Ambulatory Visit: Payer: Self-pay | Admitting: Family Medicine

## 2016-02-14 ENCOUNTER — Ambulatory Visit (HOSPITAL_COMMUNITY): Admission: RE | Admit: 2016-02-14 | Payer: Commercial Managed Care - HMO | Source: Ambulatory Visit

## 2016-02-14 ENCOUNTER — Ambulatory Visit (HOSPITAL_COMMUNITY)
Admission: RE | Admit: 2016-02-14 | Discharge: 2016-02-14 | Disposition: A | Discharge status: Home / Self Care | Payer: Commercial Managed Care - HMO | Source: Ambulatory Visit | Attending: Family Medicine | Admitting: Family Medicine

## 2016-02-14 ENCOUNTER — Other Ambulatory Visit: Payer: Self-pay | Admitting: Family Medicine

## 2016-02-14 DIAGNOSIS — Z1231 Encounter for screening mammogram for malignant neoplasm of breast: Secondary | ICD-10-CM

## 2016-02-14 DIAGNOSIS — Z780 Asymptomatic menopausal state: Secondary | ICD-10-CM | POA: Diagnosis not present

## 2016-02-14 DIAGNOSIS — Z13820 Encounter for screening for osteoporosis: Secondary | ICD-10-CM | POA: Diagnosis not present

## 2016-02-16 ENCOUNTER — Ambulatory Visit (HOSPITAL_COMMUNITY): Payer: Commercial Managed Care - HMO

## 2016-02-16 ENCOUNTER — Other Ambulatory Visit (HOSPITAL_COMMUNITY): Payer: Commercial Managed Care - HMO

## 2016-02-16 ENCOUNTER — Ambulatory Visit (HOSPITAL_COMMUNITY): Payer: Commercial Managed Care - HMO | Admitting: Hematology & Oncology

## 2016-02-22 ENCOUNTER — Encounter (HOSPITAL_COMMUNITY): Payer: Commercial Managed Care - HMO | Attending: Oncology | Admitting: Oncology

## 2016-02-22 ENCOUNTER — Encounter (HOSPITAL_COMMUNITY): Payer: Self-pay | Admitting: Oncology

## 2016-02-22 ENCOUNTER — Encounter (HOSPITAL_COMMUNITY): Payer: Commercial Managed Care - HMO

## 2016-02-22 ENCOUNTER — Encounter (HOSPITAL_COMMUNITY): Payer: Self-pay

## 2016-02-22 ENCOUNTER — Encounter (HOSPITAL_BASED_OUTPATIENT_CLINIC_OR_DEPARTMENT_OTHER): Payer: Commercial Managed Care - HMO

## 2016-02-22 VITALS — BP 154/92 | HR 88 | Temp 98.4°F | Resp 18

## 2016-02-22 VITALS — Wt 254.5 lb

## 2016-02-22 DIAGNOSIS — D560 Alpha thalassemia: Secondary | ICD-10-CM | POA: Diagnosis not present

## 2016-02-22 DIAGNOSIS — Z7982 Long term (current) use of aspirin: Secondary | ICD-10-CM | POA: Diagnosis not present

## 2016-02-22 DIAGNOSIS — D508 Other iron deficiency anemias: Secondary | ICD-10-CM

## 2016-02-22 DIAGNOSIS — Z79899 Other long term (current) drug therapy: Secondary | ICD-10-CM | POA: Insufficient documentation

## 2016-02-22 DIAGNOSIS — D563 Thalassemia minor: Secondary | ICD-10-CM

## 2016-02-22 DIAGNOSIS — E538 Deficiency of other specified B group vitamins: Secondary | ICD-10-CM | POA: Diagnosis not present

## 2016-02-22 DIAGNOSIS — I10 Essential (primary) hypertension: Secondary | ICD-10-CM | POA: Diagnosis not present

## 2016-02-22 DIAGNOSIS — E119 Type 2 diabetes mellitus without complications: Secondary | ICD-10-CM | POA: Diagnosis not present

## 2016-02-22 DIAGNOSIS — Z794 Long term (current) use of insulin: Secondary | ICD-10-CM | POA: Insufficient documentation

## 2016-02-22 DIAGNOSIS — Z23 Encounter for immunization: Secondary | ICD-10-CM | POA: Diagnosis not present

## 2016-02-22 DIAGNOSIS — E785 Hyperlipidemia, unspecified: Secondary | ICD-10-CM | POA: Diagnosis not present

## 2016-02-22 DIAGNOSIS — Z880 Allergy status to penicillin: Secondary | ICD-10-CM | POA: Diagnosis not present

## 2016-02-22 DIAGNOSIS — E669 Obesity, unspecified: Secondary | ICD-10-CM | POA: Diagnosis not present

## 2016-02-22 DIAGNOSIS — D509 Iron deficiency anemia, unspecified: Secondary | ICD-10-CM | POA: Insufficient documentation

## 2016-02-22 LAB — CBC WITH DIFFERENTIAL/PLATELET
Basophils Absolute: 0.1 10*3/uL (ref 0.0–0.1)
Basophils Relative: 1 %
EOS PCT: 4 %
Eosinophils Absolute: 0.3 10*3/uL (ref 0.0–0.7)
HCT: 35 % — ABNORMAL LOW (ref 36.0–46.0)
HEMOGLOBIN: 10.8 g/dL — AB (ref 12.0–15.0)
Lymphocytes Relative: 24 %
Lymphs Abs: 2.1 10*3/uL (ref 0.7–4.0)
MCH: 21.5 pg — AB (ref 26.0–34.0)
MCHC: 30.9 g/dL (ref 30.0–36.0)
MCV: 69.6 fL — AB (ref 78.0–100.0)
MONOS PCT: 6 %
Monocytes Absolute: 0.5 10*3/uL (ref 0.1–1.0)
NEUTROS PCT: 65 %
Neutro Abs: 5.6 10*3/uL (ref 1.7–7.7)
Platelets: 361 10*3/uL (ref 150–400)
RBC: 5.03 MIL/uL (ref 3.87–5.11)
RDW: 15.9 % — ABNORMAL HIGH (ref 11.5–15.5)
WBC: 8.6 10*3/uL (ref 4.0–10.5)

## 2016-02-22 LAB — IRON AND TIBC
IRON: 42 ug/dL (ref 28–170)
SATURATION RATIOS: 12 % (ref 10.4–31.8)
TIBC: 342 ug/dL (ref 250–450)
UIBC: 300 ug/dL

## 2016-02-22 LAB — VITAMIN B12: Vitamin B-12: 429 pg/mL (ref 180–914)

## 2016-02-22 LAB — FERRITIN: FERRITIN: 119 ng/mL (ref 11–307)

## 2016-02-22 MED ORDER — INFLUENZA VAC SPLIT QUAD 0.5 ML IM SUSY
PREFILLED_SYRINGE | INTRAMUSCULAR | Status: AC
  Filled 2016-02-22: qty 0.5

## 2016-02-22 MED ORDER — INFLUENZA VAC SPLIT QUAD 0.5 ML IM SUSY
0.5000 mL | PREFILLED_SYRINGE | Freq: Once | INTRAMUSCULAR | Status: AC
  Administered 2016-02-22: 0.5 mL via INTRAMUSCULAR

## 2016-02-22 MED ORDER — CYANOCOBALAMIN 1000 MCG/ML IJ SOLN
1000.0000 ug | Freq: Once | INTRAMUSCULAR | Status: AC
Start: 2016-02-22 — End: 2016-02-22
  Administered 2016-02-22: 1000 ug via INTRAMUSCULAR

## 2016-02-22 NOTE — Progress Notes (Signed)
[place] at [place] NOTE  Patient Care Team: Fayrene Helper, MD as PCP - General Danie Binder, MD as Consulting Physician (Gastroenterology) Patrici Ranks, MD as Consulting Physician (Hematology and Oncology)  CHIEF COMPLAINTS/PURPOSE OF CONSULTATION:  Microcytic Anemia Serum ferritin 38 ng/ml Colonoscopy 05/15/2015 with redundant L colon, diverticulosis, small internal hemorrhoids EGD 05/14/2014 with gastric polyps, GE junction stricture Givens Capsule 06/02/2015 OCCASIONAL EROSION IN DUODENUM No masses, ULCERS, or AVMs SEEN. OCCASIONAL LYMPHANGECTASIA   HISTORY OF PRESENTING ILLNESS:  Helen Mahoney 65 y.o. female is here because of low ferritin levels and anemia.  She presents today to review laboratory studies from her last visit. She is alone today.  She has had a thorough GI evaluation.    Labs were reviewed with the patient. She was advised that her microcytosis is long standing. She continues on ferrous sulfate once daily.   She denies excessive fatigue, nausea, vomiting or abdominal pain. She states she is complaint with her daily iron. The patient's ferritin Going up and She Would Be Advised Not to Take Any Iron Tablets Particularly in View of Patient's Thalassemia.  She is getting her regular mammogram done by primary care physician  MEDICAL HISTORY:  Past Medical History:  Diagnosis Date  . 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 B12 deficiency 06/16/2015  . Colon polyps   . Diabetes mellitus, type 2 ([place])   . Hyperlipidemia   . Hypertension   . Microcytic anemia 05/18/2015   2013: TCS/EGD 2017: TCS/EGD HYPERPLASTIC GASTRIC POLYPS, LYMPHOCYTIC GASTRITIS   . Obesity     SURGICAL HISTORY: Past Surgical History:  Procedure Laterality Date  . bilateral tubal ligation  1979  . CHOLECYSTECTOMY  2001   ACUTE CHOLECYSTITIS/GALLSTONES   . COLONOSCOPY  05/14/2011   Dr. Oneida Alar: sessile polyp in sigmoid colon, internal hemorrhoids, hyerplastic polyps  . COLONOSCOPY N/A 05/15/2015   Procedure: COLONOSCOPY;  Surgeon: Danie Binder, MD;  [facility];  Service: Endoscopy;  Laterality: N/A;  0830  . COLONOSCOPY W/ POLYPECTOMY  NOV 2008 [facility] ANEMIA   POLYP NO RETRIEVED  . COLONOSCOPY W/ POLYPECTOMY  2006 DR. SMTH   POLYP?  . ESOPHAGOGASTRODUODENOSCOPY  05/14/2011   Dr. Oneida Alar: sessile polyps in the cardia, mild gastritis. Chronic duodenitis consistent with peptic duodenitis, chronic active H.pylori gastritis.   Marland Kitchen ESOPHAGOGASTRODUODENOSCOPY N/A 05/15/2015   Procedure: ESOPHAGOGASTRODUODENOSCOPY (EGD);  Surgeon: Danie Binder, MD;  Location[facility];  Service: Endoscopy;  Laterality: N/A;  . GIVENS CAPSULE STUDY N/A 06/02/2015   Procedure: GIVENS CAPSULE STUDY;  Surgeon: Danie Binder, MD;  Location[facility];  Service: Endoscopy;  Laterality: N/A;  0700  . TUBAL LIGATION    . UPPER GASTROINTESTINAL ENDOSCOPY  NOV 2008 [facility] ANEMIA   NL EXAM, urease neg    SOCIAL HISTORY: Social History   Social History  . Marital status: Married    Spouse name: N/A  . Number of children: N/A  . Years of education: N/A   Occupational History  . Not on file.   Social History Main Topics  . Smoking status: Never Smoker  . Smokeless tobacco: Not on file  . Alcohol use No  . Drug use: No  . Sexual activity: Not Currently   Other Topics Concern  . Not on file   Social History Narrative  . No narrative on file  Married 45 years 2 children,  both boys 4 grandchildren She does not work outside the home She enjoys shopping Non smoker ETOH, none  FAMILY HISTORY: Family History  Problem Relation Age of Onset  . Cancer Mother     breast   . Heart disease Mother   . Diabetes Mother   . Hypertension Mother   . Stroke Mother   . Diabetes Father   . Heart attack Father   . Kidney disease Father   . Hypertension  Sister   . Diabetes Sister   . Diabetes Sister   . Mental illness Sister   . Hypertension Sister   . Mental illness Sister   . Diabetes Sister   . Hypertension Sister   . Mental illness Sister   . Colon cancer Sister   . Diabetes Brother    indicated that her mother is deceased. She indicated that her father is deceased. She indicated that all of her four sisters are alive. She indicated that both of her brothers are deceased. She indicated that both of her sons are alive.   Mother deceased in her 35s Father deceased in his 50s. One parent died of heart trouble and another of pneumonia Her sibling died of excessive drinking Her sister had rectal colon cancer, diagnosed 6-7 years ago. She had surgery with Dr. Laural Golden. No chemotherapy or radiation treatment.  ALLERGIES:  is allergic to tetanus toxoids and penicillins.  MEDICATIONS:  Current Outpatient Prescriptions  Medication Sig Dispense Refill  . aspirin 81 MG tablet Take 81 mg by mouth daily.    . Calcium Citrate-Vitamin D (CITRACAL PETITES/VITAMIN D) 200-250 MG-UNIT TABS Take 1 tablet by mouth daily.     . ergocalciferol (VITAMIN D2) 50000 units capsule Take 1 capsule (50,000 Units total) by mouth once a week. One capsule once weekly 12 capsule 1  . Ferrous Sulfate (IRON) 325 (65 FE) MG TABS Take 1 tablet by mouth daily. 100 each 2  . glipiZIDE (GLUCOTROL) 10 MG tablet TAKE 1 TABLET (10 MG TOTAL) BY MOUTH 2 (TWO) TIMES DAILY BEFORE A MEAL. 180 tablet 1  . LANTUS SOLOSTAR 100 UNIT/ML Solostar Pen INJECT 25 UNITS SUBCUTANEOUSLY ONCE DAILY AT 10 PM 15 mL 2  . metFORMIN (GLUCOPHAGE) 1000 MG tablet TAKE 1 TABLET (1,000 MG TOTAL)  TWO  TIMES DAILY WITH A MEAL. 180 tablet 1  . Multiple Vitamin (MULTIVITAMINS PO) Take 1 tablet by mouth daily.     . pravastatin (PRAVACHOL) 80 MG tablet TAKE 1 TABLET ONE TIME DAILY IN THE EVENING 90 tablet 1  . RELION SHORT PEN NEEDLES 31G X 8 MM MISC USE ONE ONCE DAILY 50 each 5   No current  facility-administered medications for this visit.     Review of Systems  Constitutional: Negative.  Negative for malaise/fatigue.  HENT: Negative.   Eyes: Negative.   Respiratory: Negative.  Negative for shortness of breath and wheezing.   Cardiovascular: Negative.  Negative for chest pain.  Gastrointestinal: Negative.  Negative for abdominal pain, blood in stool and melena.       Abnormal colored stool associated with oral iron intake.  Genitourinary: Negative.   Musculoskeletal: Positive for joint pain.       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  Skin: Negative.   Neurological: Negative.  Negative for weakness.  Endo/Heme/Allergies: Negative.   Psychiatric/Behavioral: Negative.   All other systems reviewed and are negative.  14 point ROS was done and is otherwise as detailed above or in HPI   PHYSICAL EXAMINATION: ECOG PERFORMANCE STATUS: 0 -  Asymptomatic Weight 254 lb 8 oz (115.4 kg). Blood pressure is 1 54 x 92  Pulse is 88. Physical Exam  Constitutional: She is oriented to person, place, and time and well-developed, well-nourished, and in no distress.  HENT:  Head: Normocephalic and atraumatic.  Nose: Nose normal.  Mouth/Throat: Oropharynx is clear and moist. No oropharyngeal exudate.  Eyes: Conjunctivae and EOM are normal. Pupils are equal, round, and reactive to light. Right eye exhibits no discharge. Left eye exhibits no discharge. No scleral icterus.  Neck: Normal range of motion. Neck supple. No tracheal deviation present. No thyromegaly present.  Cardiovascular: Normal rate, regular rhythm and normal heart sounds.  Exam reveals no gallop and no friction rub.   No murmur heard. Pulmonary/Chest: Effort normal and breath sounds normal. She has no wheezes. She has no rales.  Abdominal: Soft. Bowel sounds are normal. She exhibits no distension and no mass. There is no tenderness. There is no rebound and no guarding.  Musculoskeletal: Normal range of motion. She exhibits no edema.   Lymphadenopathy:    She has no cervical adenopathy.  Neurological: She is alert and oriented to person, place, and time. She has normal reflexes. No cranial nerve deficit. Gait normal. Coordination normal.  Skin: Skin is warm and dry. No rash noted.  Psychiatric: Mood, memory, affect and judgment normal.  Nursing note and vitals reviewed. patient   is moderately obese  LABORATORY DATA:  I have reviewed the data as listed Lab Results  Component Value Date   WBC 8.6 02/22/2016   HGB 10.8 (L) 02/22/2016   HCT 35.0 (L) 02/22/2016   MCV 69.6 (L) 02/22/2016   PLT 361 02/22/2016    Results for TAMIIA, LEBECK (MRN [id number])  Ref. Range 09/21/2008 21:20 10/16/2010 14:20 05/01/2011 10:50 09/08/2012 10:00 10/02/2013 09:02 10/15/2014 08:31 03/31/2015 09:09 06/15/2015 15:10  Hemoglobin Latest Ref Range: 12.0-15.0 g/dL 10.5 (L) 10.4 (L) 10.2 (L) 9.7 (L) 10.1 (L) 9.6 (L) 9.8 (L) 9.7 (L)   Results for NERY, MUZNY (MRN [id number])   Ref. Range 09/21/2008 21:20 10/16/2010 14:20 05/01/2011 10:50 09/08/2012 10:00 10/02/2013 09:02 10/15/2014 08:31 03/31/2015 09:09 06/15/2015 15:10  MCV Latest Ref Range: 78.0-100.0 fL 70.9 (L) 68.7 (L) 69.1 (L) 67.6 (L) 65.3 (L) 68.8 (L) 68.2 (L) 68.4 (L)   Results for EMMACLAIRE, SHUBA (MRN [id number]) as of 07/16/2015 16:53  Ref. Range 05/01/2011 10:50 09/08/2012 10:00 10/15/2014 08:31 03/31/2015 09:09 07/14/2015 09:53  Ferritin Latest Ref Range: 11-307 ng/mL 40 27 31 38 33     PATHOLOGY   RADIOGRAPHIC STUDIES: I have personally reviewed the radiological images as listed and agreed with the findings in the report. No results found.     ASSESSMENT & PLAN:  Microcytic Anemia longstanding dating back to 2010. Thalassemia trait(alpha   Thalassemia) Serum ferritin 38 ng/ml Colonoscopy 05/15/2015 with redundant L colon, diverticulosis, small internal hemorrhoids EGD 05/14/2014 with gastric polyps, GE junction stricture Givens Capsule 06/02/2015 OCCASIONAL EROSION IN  DUODENUM No masses, ULCERS, or AVMs SEEN. OCCASIONAL LYMPHANGECTASIA B12 deficiency Diastolic blood pressure is slightly elevated.  Patient has an appointment with primary care physician in December and she was advised to get evaluation done Will continue vitamin B12 injection     Patient will be  reevaluated in 6 month  All questions were answered. The patient knows to call the clinic with any problems, questions or concerns.  This note was electronically signed.   Delorise Shiner [facility]  02/22/2016 10:47 AM

## 2016-02-22 NOTE — Patient Instructions (Signed)
[facility] at [facility] Discharge Instructions  RECOMMENDATIONS MADE BY THE CONSULTANT AND ANY TEST RESULTS WILL BE SENT TO YOUR REFERRING PHYSICIAN.  B12 injection today. Return as scheduled for injections. Return as scheduled for lab work and office visit.  Thank you for choosing [facility] at [facility] to provide your oncology and hematology care.  To afford each patient quality time with our provider, please arrive at least 15 minutes before your scheduled appointment time.   Beginning January 23rd 2017 lab work for the Cancer Center will be done in the  Main lab at [facility] on 1st floor. If you have a lab appointment with the Cancer Center please come in thru the  Main Entrance and check in at the main information desk  You need to re-schedule your appointment should you arrive 10 or more minutes late.  We strive to give you quality time with our providers, and arriving late affects you and other patients whose appointments are after yours.  Also, if you no show three or more times for appointments you may be dismissed from the clinic at the providers discretion.     Again, thank you for choosing [facility].  Our hope is that these requests will decrease the amount of time that you wait before being seen by our physicians.       _____________________________________________________________  Should you have questions after your visit to [facility], please contact our office at [phone] between the hours of 8:30 a.m. and 4:30 p.m.  Voicemails left after 4:30 p.m. will not be returned until the following business day.  For prescription refill requests, have your pharmacy contact our office.         Resources For Cancer Patients and their Caregivers ? American Cancer Society: Can assist with transportation, wigs, general needs, runs Look Good Feel Better.        1-888-227-6333 ? Cancer  Care: Provides financial assistance, online support groups, medication/co-pay assistance.  1-800-813-HOPE (4673) ? [facility] Assists [place] cancer patients and their families through emotional , educational and financial support.  336-427-4357 ? [place] DSS Where to apply for food stamps, Medicaid and utility assistance. 336-342-1394 ? RCATS: Transportation to medical appointments. 336-347-2287 ? Social Security Administration: May apply for disability if have a Stage IV cancer. 336-342-7796 1-800-772-1213 ? [place] Aging, Disability and Transit Services: Assists with nutrition, care and transit needs. 336-349-2343  Cancer Center Support Programs: @10RELATIVEDAYS@ > Cancer Support Group  2nd Tuesday of the month 1pm-2pm, Journey Room  > Creative Journey  3rd Tuesday of the month 1130am-1pm, Journey Room  > Look Good Feel Better  1st Wednesday of the month 10am-12 noon, Journey Room (Call [employer] to register [phone])   

## 2016-02-22 NOTE — Patient Instructions (Signed)
[place] at [facility] Discharge Instructions  RECOMMENDATIONS MADE BY THE CONSULTANT AND ANY TEST RESULTS WILL BE SENT TO YOUR REFERRING PHYSICIAN.  Exam with Dr. Oliva Bustard today.     Thank you for choosing [place] at [facility] to provide your oncology and hematology care.  To afford each patient quality time with our provider, please arrive at least 15 minutes before your scheduled appointment time.   Beginning January 23rd 2017 lab work for the [facility] will be done in the  Main lab at [facility] on 1st floor. If you have a lab appointment with the [place] please come in thru the  Main Entrance and check in at the main information desk  You need to re-schedule your appointment should you arrive 10 or more minutes late.  We strive to give you quality time with our providers, and arriving late affects you and other patients whose appointments are after yours.  Also, if you no show three or more times for appointments you may be dismissed from the clinic at the providers discretion.     Again, thank you for choosing [facility].  Our hope is that these requests will decrease the amount of time that you wait before being seen by our physicians.       _____________________________________________________________  Should you have questions after your visit to [facility], please contact our office at [phone] between the hours of 8:30 a.m. and 4:30 p.m.  Voicemails left after 4:30 p.m. will not be returned until the following business day.  For prescription refill requests, have your pharmacy contact our office.         Resources For Cancer Patients and their Caregivers ? American Cancer Society: Can assist with transportation, wigs, general needs, runs Look Good Feel Better.        404-265-9188 ? Cancer Care: Provides financial assistance, online support groups, medication/co-pay assistance.   1-800-813-HOPE (320)824-0678) ? [place] Assists [place] cancer patients and their families through emotional , educational and financial support.  (412) 122-5659 ? [place] DSS Where to apply for food stamps, Medicaid and utility assistance. (909) 863-2793 ? RCATS: Transportation to medical appointments. (715)710-4888 ? Social Security Administration: May apply for disability if have a Stage IV cancer. 470-003-6475 [phone]) [phone] ? [employer], Disability and Transit Services: Assists with nutrition, care and transit needs. [place] Support Programs: @10RELATIVEDAYS @ > Cancer Support Group  2nd Tuesday of the month 1pm-2pm, Journey Room  > Creative Journey  3rd Tuesday of the month 1130am-1pm, Journey Room  > Look Good Feel Better  1st Wednesday of the month 10am-12 noon, Journey Room (Call [place] to register [phone])

## 2016-02-22 NOTE — Progress Notes (Signed)
Helen Mahoney presents today for injection per MD orders. Flu vaccination administered IM in right Upper Arm. Administration without incident. Patient tolerated well.

## 2016-02-22 NOTE — Progress Notes (Signed)
Helen Mahoney presents today for injection per the provider's orders.  B12 administration without incident; see MAR for injection details.  Patient tolerated procedure well and without incident.  No questions or complaints noted at this time.

## 2016-02-27 IMAGING — MG MM DIGITAL SCREENING
3 series · 3 of 3 positions shown · non-contrast
Comparison: Previous exam(s).

CLINICAL DATA: Screening.

EXAM:
DIGITAL SCREENING BILATERAL MAMMOGRAM WITH CAD

[L MLO]
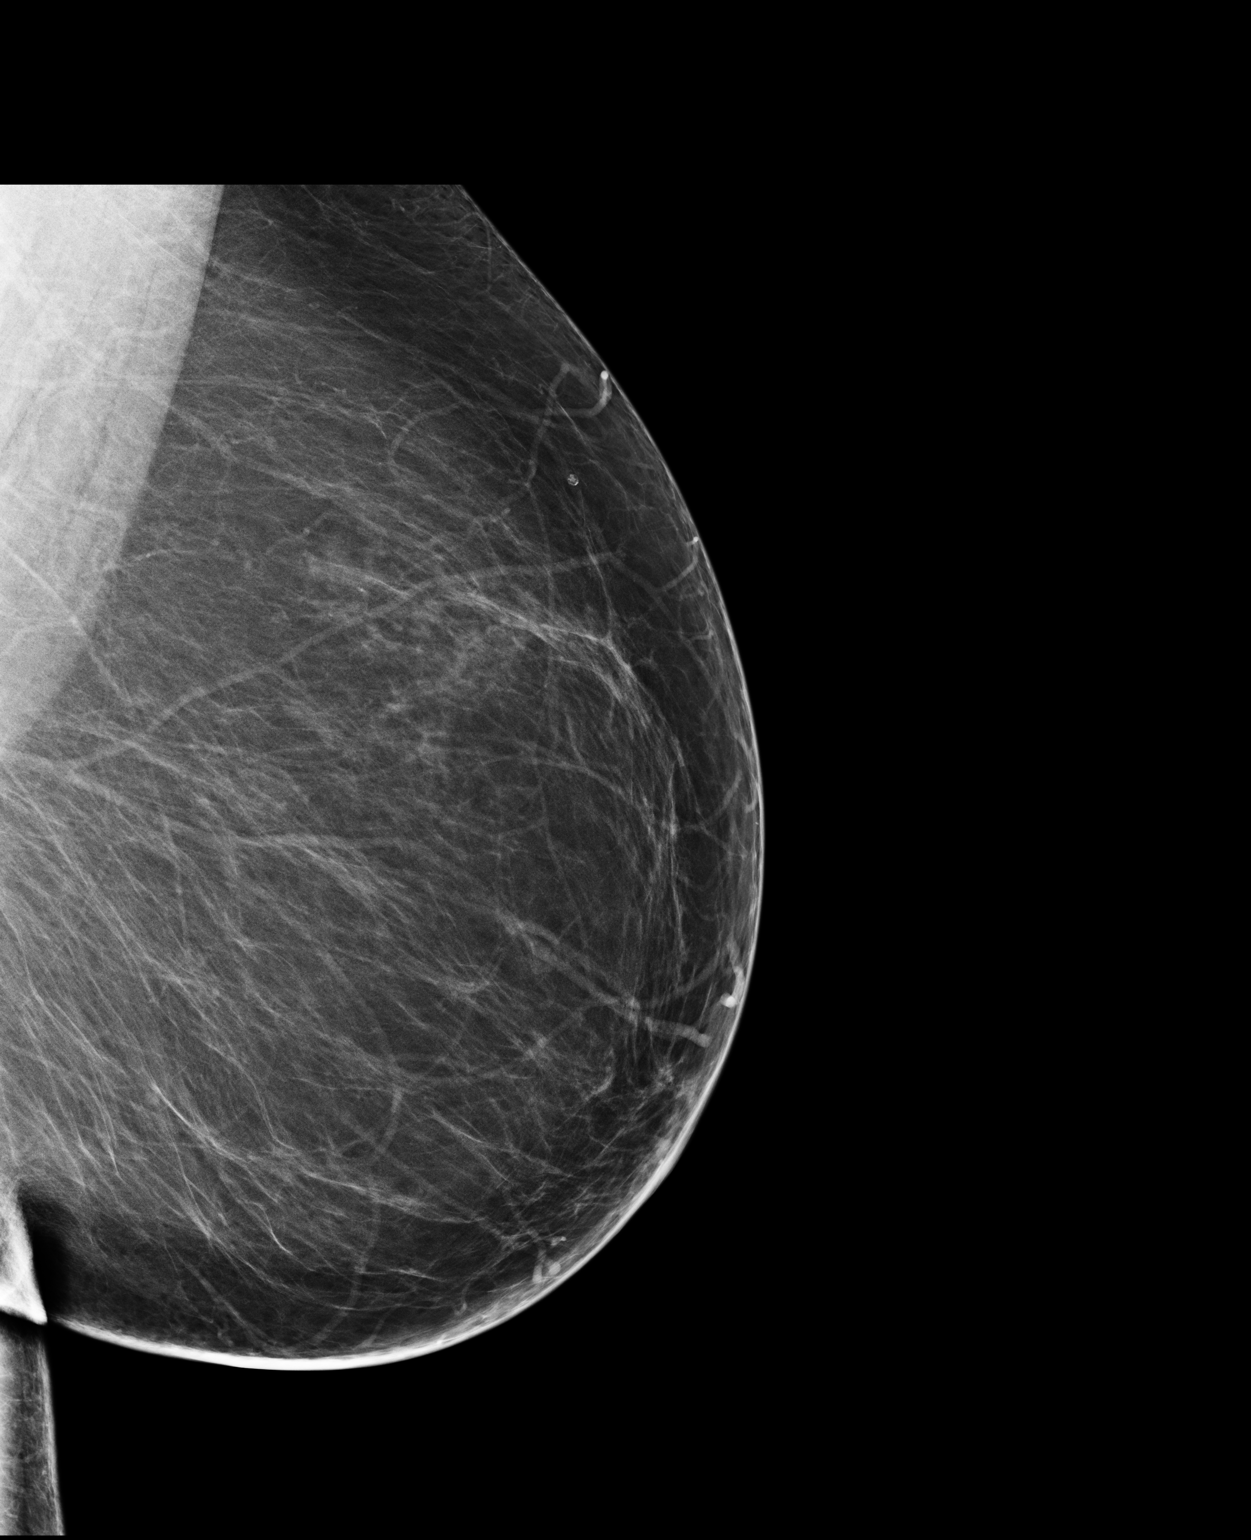

[R CC]
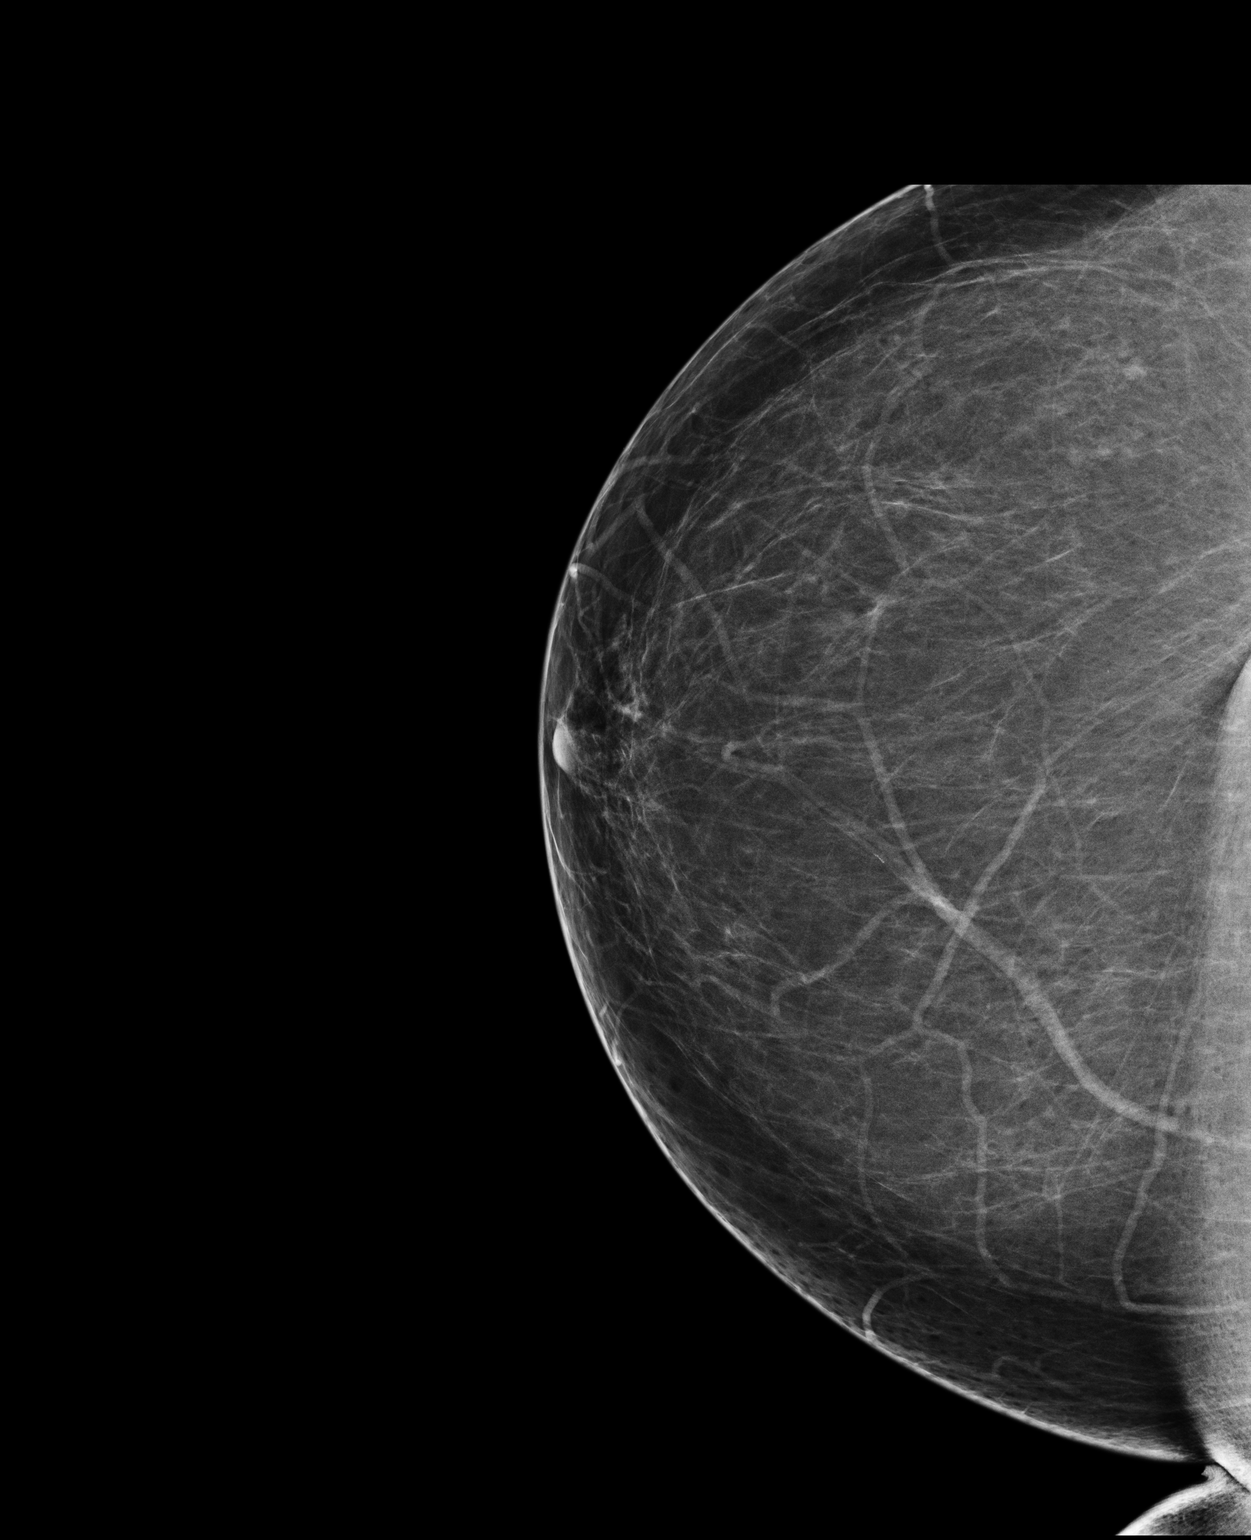

[R MLO]
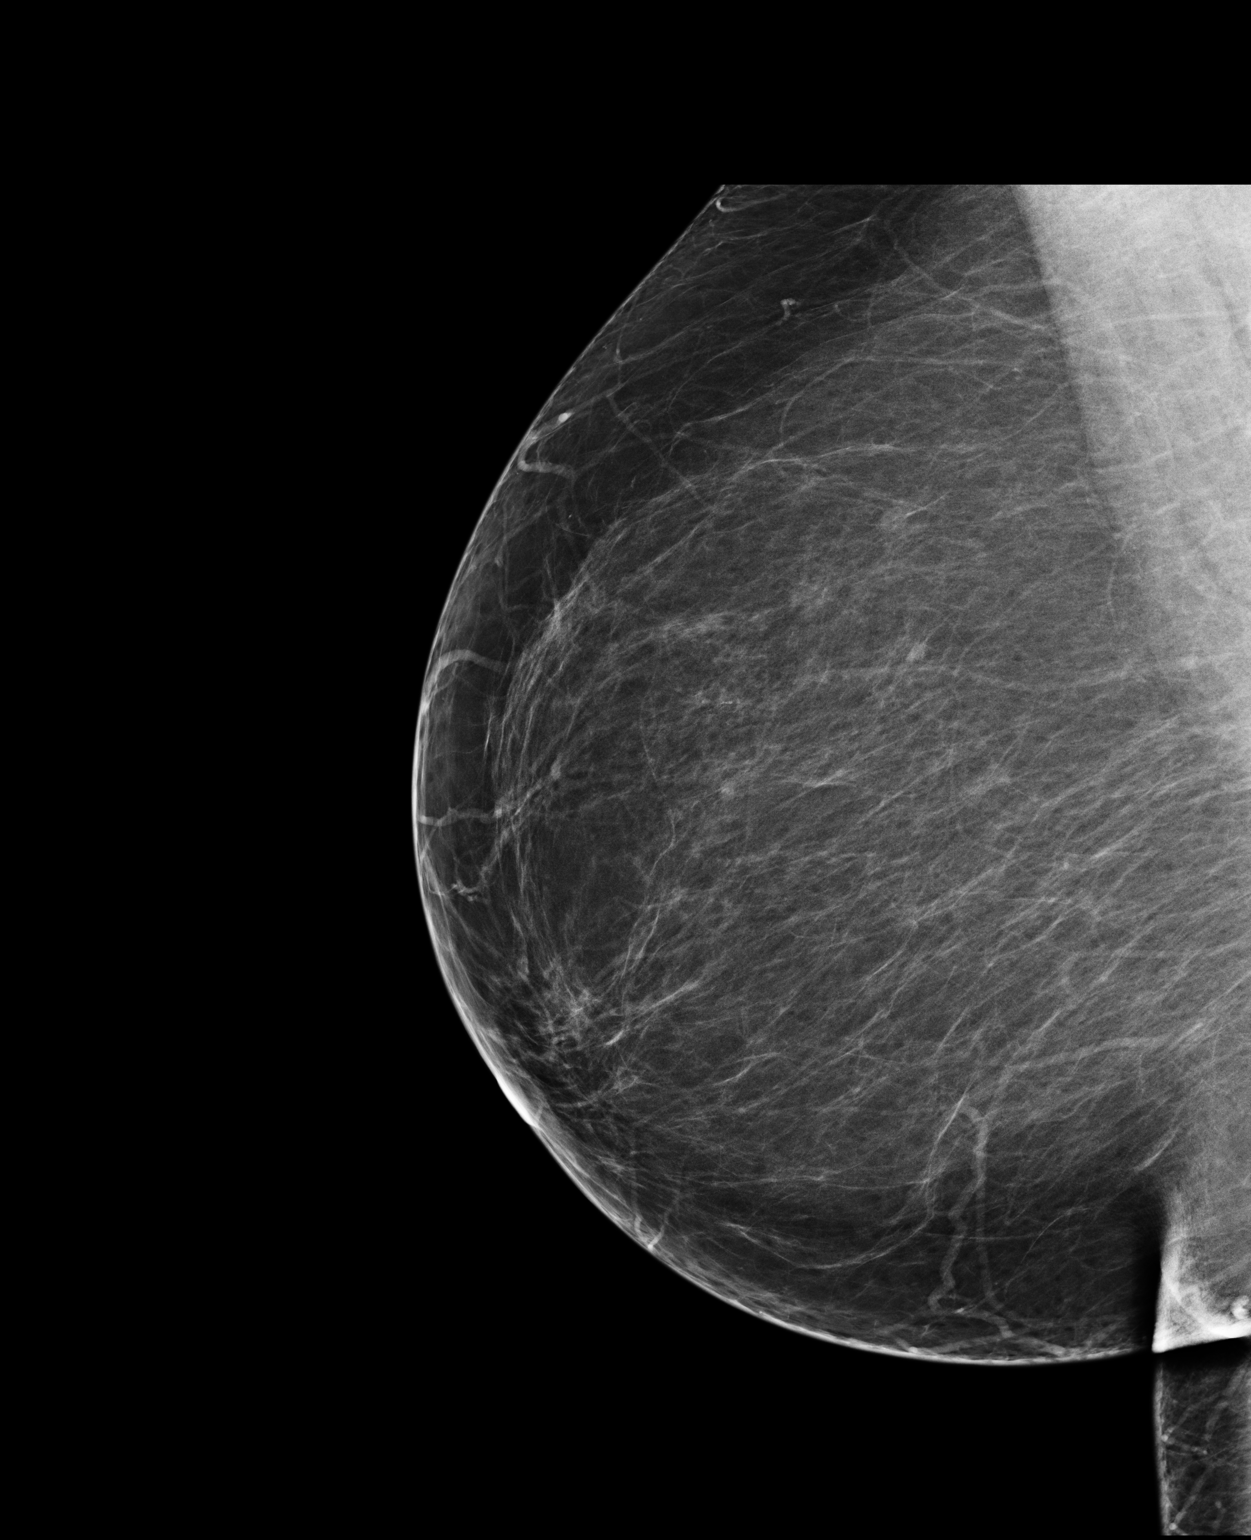

[3 of 3 positions shown; findings below may reference images not displayed]

ACR Breast Density Category b: There are scattered areas of
fibroglandular density.
FINDINGS: There are no findings suspicious for malignancy. Images were
processed with CAD.
IMPRESSION: No mammographic evidence of malignancy. A result letter of this
screening mammogram will be mailed directly to the patient.

RECOMMENDATION:
Screening mammogram in one year. (Code:AS-G-LCT)

BI-RADS CATEGORY  1: Negative.

## 2016-03-04 ENCOUNTER — Other Ambulatory Visit: Payer: Self-pay | Admitting: Family Medicine

## 2016-03-04 DIAGNOSIS — E559 Vitamin D deficiency, unspecified: Secondary | ICD-10-CM

## 2016-03-25 ENCOUNTER — Encounter (HOSPITAL_COMMUNITY): Payer: Commercial Managed Care - HMO | Attending: Hematology & Oncology

## 2016-03-25 VITALS — BP 135/69 | HR 99 | Temp 98.3°F | Resp 20

## 2016-03-25 DIAGNOSIS — Z79899 Other long term (current) drug therapy: Secondary | ICD-10-CM | POA: Insufficient documentation

## 2016-03-25 DIAGNOSIS — E669 Obesity, unspecified: Secondary | ICD-10-CM | POA: Insufficient documentation

## 2016-03-25 DIAGNOSIS — Z803 Family history of malignant neoplasm of breast: Secondary | ICD-10-CM | POA: Insufficient documentation

## 2016-03-25 DIAGNOSIS — Z888 Allergy status to other drugs, medicaments and biological substances status: Secondary | ICD-10-CM | POA: Insufficient documentation

## 2016-03-25 DIAGNOSIS — Z823 Family history of stroke: Secondary | ICD-10-CM | POA: Insufficient documentation

## 2016-03-25 DIAGNOSIS — Z86010 Personal history of colonic polyps: Secondary | ICD-10-CM | POA: Insufficient documentation

## 2016-03-25 DIAGNOSIS — E538 Deficiency of other specified B group vitamins: Secondary | ICD-10-CM

## 2016-03-25 DIAGNOSIS — Z7982 Long term (current) use of aspirin: Secondary | ICD-10-CM | POA: Insufficient documentation

## 2016-03-25 DIAGNOSIS — E119 Type 2 diabetes mellitus without complications: Secondary | ICD-10-CM | POA: Insufficient documentation

## 2016-03-25 DIAGNOSIS — E785 Hyperlipidemia, unspecified: Secondary | ICD-10-CM | POA: Insufficient documentation

## 2016-03-25 DIAGNOSIS — Z808 Family history of malignant neoplasm of other organs or systems: Secondary | ICD-10-CM | POA: Insufficient documentation

## 2016-03-25 DIAGNOSIS — I10 Essential (primary) hypertension: Secondary | ICD-10-CM | POA: Insufficient documentation

## 2016-03-25 DIAGNOSIS — Z833 Family history of diabetes mellitus: Secondary | ICD-10-CM | POA: Insufficient documentation

## 2016-03-25 DIAGNOSIS — Z9049 Acquired absence of other specified parts of digestive tract: Secondary | ICD-10-CM | POA: Insufficient documentation

## 2016-03-25 DIAGNOSIS — Z880 Allergy status to penicillin: Secondary | ICD-10-CM | POA: Insufficient documentation

## 2016-03-25 DIAGNOSIS — Z7984 Long term (current) use of oral hypoglycemic drugs: Secondary | ICD-10-CM | POA: Insufficient documentation

## 2016-03-25 DIAGNOSIS — Z9889 Other specified postprocedural states: Secondary | ICD-10-CM | POA: Insufficient documentation

## 2016-03-25 DIAGNOSIS — Z794 Long term (current) use of insulin: Secondary | ICD-10-CM | POA: Insufficient documentation

## 2016-03-25 DIAGNOSIS — D509 Iron deficiency anemia, unspecified: Secondary | ICD-10-CM | POA: Insufficient documentation

## 2016-03-25 MED ORDER — CYANOCOBALAMIN 1000 MCG/ML IJ SOLN
INTRAMUSCULAR | Status: AC
  Filled 2016-03-25: qty 1

## 2016-03-25 MED ORDER — CYANOCOBALAMIN 1000 MCG/ML IJ SOLN
1000.0000 ug | Freq: Once | INTRAMUSCULAR | Status: AC
  Administered 2016-03-25: 1000 ug via INTRAMUSCULAR

## 2016-03-25 NOTE — Progress Notes (Signed)
Helen Mahoney presents today for injection per MD orders. B12 1000 mcg administered IM in left Upper Arm. Administration without incident. Patient tolerated well.  

## 2016-03-25 NOTE — Patient Instructions (Signed)
[facility] at [facility] Discharge Instructions  RECOMMENDATIONS MADE BY THE CONSULTANT AND ANY TEST RESULTS WILL BE SENT TO YOUR REFERRING PHYSICIAN.  Vitamin B12 1000 mcg injection given as ordered. Return as scheduled.  Thank you for choosing  [facility] at [facility] to provide your oncology and hematology care.  To afford each patient quality time with our provider, please arrive at least 15 minutes before your scheduled appointment time.   Beginning January 23rd 2017 lab work for the Cancer Center will be done in the  Main lab at [facility] on 1st floor. If you have a lab appointment with the Cancer Center please come in thru the  Main Entrance and check in at the main information desk  You need to re-schedule your appointment should you arrive 10 or more minutes late.  We strive to give you quality time with our providers, and arriving late affects you and other patients whose appointments are after yours.  Also, if you no show three or more times for appointments you may be dismissed from the clinic at the providers discretion.     Again, thank you for choosing [facility].  Our hope is that these requests will decrease the amount of time that you wait before being seen by our physicians.       _____________________________________________________________  Should you have questions after your visit to [facility], please contact our office at [phone] between the hours of 8:30 a.m. and 4:30 p.m.  Voicemails left after 4:30 p.m. will not be returned until the following business day.  For prescription refill requests, have your pharmacy contact our office.         Resources For Cancer Patients and their Caregivers ? American Cancer Society: Can assist with transportation, wigs, general needs, runs Look Good Feel Better.        1-888-227-6333 ? Cancer Care: Provides financial assistance, online support  groups, medication/co-pay assistance.  1-800-813-HOPE (4673) ? [facility] Assists [place] cancer patients and their families through emotional , educational and financial support.  336-427-4357 ? [place] DSS Where to apply for food stamps, Medicaid and utility assistance. 336-342-1394 ? [employer]: Transportation to medical appointments. 336-347-2287 ? Social Security Administration: May apply for disability if have a Stage IV cancer. 336-342-7796 1-800-772-1213 ? [place] Aging, Disability and Transit Services: Assists with nutrition, care and transit needs. 336-349-2343  Cancer Center Support Programs: [email or web address]@ > Cancer Support Group  2nd Tuesday of the month 1pm-2pm, Journey Room  > Creative Journey  3rd Tuesday of the month 1130am-1pm, Journey Room  > Look Good Feel Better  1st Wednesday of the month 10am-12 noon, Journey Room (Call American Cancer Society to register 1-800-395-5775)   

## 2016-03-28 DIAGNOSIS — I10 Essential (primary) hypertension: Secondary | ICD-10-CM | POA: Diagnosis not present

## 2016-03-28 DIAGNOSIS — Z794 Long term (current) use of insulin: Secondary | ICD-10-CM | POA: Diagnosis not present

## 2016-03-28 DIAGNOSIS — E785 Hyperlipidemia, unspecified: Secondary | ICD-10-CM | POA: Diagnosis not present

## 2016-03-28 DIAGNOSIS — E119 Type 2 diabetes mellitus without complications: Secondary | ICD-10-CM | POA: Diagnosis not present

## 2016-03-28 LAB — COMPLETE METABOLIC PANEL WITH GFR
ALBUMIN: 3.6 g/dL (ref 3.6–5.1)
ALT: 14 U/L (ref 6–29)
AST: 15 U/L (ref 10–35)
Alkaline Phosphatase: 128 U/L (ref 33–130)
BILIRUBIN TOTAL: 0.3 mg/dL (ref 0.2–1.2)
BUN: 17 mg/dL (ref 7–25)
CALCIUM: 8.9 mg/dL (ref 8.6–10.4)
CHLORIDE: 105 mmol/L (ref 98–110)
CO2: 23 mmol/L (ref 20–31)
CREATININE: 0.97 mg/dL (ref 0.50–0.99)
GFR, EST AFRICAN AMERICAN: 71 mL/min (ref >=60)
GFR, Est Non African American: 61 mL/min (ref >=60)
Glucose, Bld: 153 mg/dL — ABNORMAL HIGH (ref 65–99)
Potassium: 4.4 mmol/L (ref 3.5–5.3)
Sodium: 138 mmol/L (ref 135–146)
TOTAL PROTEIN: 6.9 g/dL (ref 6.1–8.1)

## 2016-03-28 LAB — LIPID PANEL
CHOLESTEROL: 175 mg/dL (ref <200)
HDL: 50 mg/dL — ABNORMAL LOW (ref >50)
LDL CALC: 108 mg/dL — AB (ref <100)
TRIGLYCERIDES: 84 mg/dL (ref <150)
Total CHOL/HDL Ratio: 3.5 Ratio (ref <5.0)
VLDL: 17 mg/dL (ref <30)

## 2016-03-29 LAB — HEMOGLOBIN A1C
Hgb A1c MFr Bld: 7.6 % — ABNORMAL HIGH (ref <5.7)
MEAN PLASMA GLUCOSE: 171 mg/dL

## 2016-04-05 ENCOUNTER — Encounter: Payer: Commercial Managed Care - HMO | Admitting: Family Medicine

## 2016-04-11 ENCOUNTER — Encounter: Payer: Self-pay | Admitting: Family Medicine

## 2016-04-11 ENCOUNTER — Ambulatory Visit (INDEPENDENT_AMBULATORY_CARE_PROVIDER_SITE_OTHER): Payer: Commercial Managed Care - HMO | Admitting: Family Medicine

## 2016-04-11 VITALS — BP 138/88 | HR 112 | Resp 16 | Ht 66.0 in | Wt 248.1 lb

## 2016-04-11 DIAGNOSIS — Z794 Long term (current) use of insulin: Secondary | ICD-10-CM

## 2016-04-11 DIAGNOSIS — Z0000 Encounter for general adult medical examination without abnormal findings: Secondary | ICD-10-CM

## 2016-04-11 DIAGNOSIS — E119 Type 2 diabetes mellitus without complications: Secondary | ICD-10-CM

## 2016-04-11 DIAGNOSIS — IMO0001 Reserved for inherently not codable concepts without codable children: Secondary | ICD-10-CM

## 2016-04-11 NOTE — Assessment & Plan Note (Signed)
Helen Mahoney is reminded of the importance of commitment to daily physical activity for 30 minutes or more, as able and the need to limit carbohydrate intake to 30 to 60 grams per meal to help with blood sugar control.   The need to take medication as prescribed, test blood sugar as directed, and to call between visits if there is a concern that blood sugar is uncontrolled is also discussed.   Helen Mahoney is reminded of the importance of daily foot exam, annual eye examination, and good blood sugar, blood pressure and cholesterol control.  Diabetic Labs Latest Ref Rng & Units 03/28/2016 11/27/2015 10/25/2015 06/27/2015 06/15/2015  HbA1c <5.7 % 7.6(H) - 7.8(H) 7.7(H) -  Microalbumin Not Estab. ug/mL - 31.5(H) - - -  Micro/Creat Ratio 0.0 - 30.0 mg/g creat - 24.6 - - -  Chol <200 mg/dL 175 - 179 - -  HDL >50 mg/dL 50(L) - 51 - -  Calc LDL <100 mg/dL 108(H) - 106 - -  Triglycerides <150 mg/dL 84 - 108 - -  Creatinine 0.50 - 0.99 mg/dL 0.97 - 0.82 0.88 0.88   BP/Weight 04/11/2016 03/25/2016 02/22/2016 02/22/2016 01/22/2016 12/21/2015 [id number]  Systolic BP 0000000 [id number] 123456 - [id number] 123456  Diastolic BP 88 69 92 - 67 67 82  Wt. (Lbs) 248.12 - - 254.5 - - 255.8  BMI 40.05 - - 41.08 - - 41.29   Foot/eye exam completion dates Latest Ref Rng & Units 04/11/2016 12/06/2015  Eye Exam No Retinopathy - No Retinopathy  Foot exam Order - - -  Foot Form Completion - Done -   Updated lab needed at/ before next visit.

## 2016-04-11 NOTE — Addendum Note (Signed)
Addended by: Denman George B on: 04/11/2016 04:03 PM   Modules accepted: Orders

## 2016-04-11 NOTE — Assessment & Plan Note (Signed)
Annual exam as documented. Counseling done  re healthy lifestyle involving commitment to 150 minutes exercise per week, heart healthy diet, and attaining healthy weight.The importance of adequate sleep also discussed.  Immunization and cancer screening needs are specifically addressed at this visit.  

## 2016-04-11 NOTE — Progress Notes (Signed)
Helen Mahoney     MRN: [id number]      DOB: 1950/07/15  HPI: Patient is in for annual physical exam. No other health concerns are expressed or addressed at the visit. Recent labs, if available are reviewed. Immunization is reviewed , and  updated if needed.   PE: Pleasant  female, alert and oriented x 3, in no cardio-pulmonary distress. Afebrile. HEENT No facial trauma or asymetry. Sinuses non tender.  Extra occullar muscles intact, External ears normal, tympanic membranes clear. Oropharynx moist, no exudate. Neck: supple, no adenopathy,JVD or thyromegaly.No bruits.  Chest: Clear to ascultation bilaterally.No crackles or wheezes. Non tender to palpation  Breast: No asymetry,no masses or lumps. No tenderness. No nipple discharge or inversion. No axillary or supraclavicular adenopathy  Cardiovascular system; Heart sounds normal,  S1 and  S2 ,no S3.  No murmur, or thrill. Apical beat not displaced Peripheral pulses normal.  Abdomen: Soft, non tender, no organomegaly or masses. No bruits. Bowel sounds normal. No guarding, tenderness or rebound.  Rectal:  Normal sphincter tone. No rectal mass. Guaiac negative stool.  GU: External genitalia normal female genitalia , normal female distribution of hair. No lesions. Urethral meatus normal in size, no  Prolapse, no lesions visibly  Present. Bladder non tender. Vagina pink and moist , with no visible lesions , discharge present . Adequate pelvic support no  cystocele or rectocele noted Cervix pink and appears healthy, no lesions or ulcerations noted, no discharge noted from os Uterus normal size, no adnexal masses, no cervical motion or adnexal tenderness.   Musculoskeletal exam: Full ROM of spine, hips , shoulders and knees. No deformity ,swelling or crepitus noted. No muscle wasting or atrophy.   Neurologic: Cranial nerves 2 to 12 intact. Power, tone ,sensation and reflexes normal throughout. No  disturbance in gait. No tremor.  Skin: Intact, no ulceration, erythema , scaling or rash noted. Pigmentation normal throughout  Psych; Normal mood and affect. Judgement and concentration normal   Assessment & Plan:  Annual physical exam Annual exam as documented. Counseling done  re healthy lifestyle involving commitment to 150 minutes exercise per week, heart healthy diet, and attaining healthy weight.The importance of adequate sleep also discussed.  Immunization and cancer screening needs are specifically addressed at this visit.   Diabetes mellitus, insulin dependent (IDDM), controlled [name] is reminded of the importance of commitment to daily physical activity for 30 minutes or more, as able and the need to limit carbohydrate intake to 30 to 60 grams per meal to help with blood sugar control.   The need to take medication as prescribed, test blood sugar as directed, and to call between visits if there is a concern that blood sugar is uncontrolled is also discussed.   [name] is reminded of the importance of daily foot exam, annual eye examination, and good blood sugar, blood pressure and cholesterol control.  Diabetic Labs Latest Ref Rng & Units 03/28/2016 11/27/2015 10/25/2015 06/27/2015 06/15/2015  HbA1c <5.7 % 7.6(H) - 7.8(H) 7.7(H) -  Microalbumin Not Estab. ug/mL - 31.5(H) - - -  Micro/Creat Ratio 0.0 - 30.0 mg/g creat - 24.6 - - -  Chol <200 mg/dL 175 - 179 - -  HDL >50 mg/dL 50(L) - 51 - -  Calc LDL <100 mg/dL 108(H) - 106 - -  Triglycerides <150 mg/dL 84 - 108 - -  Creatinine 0.50 - 0.99 mg/dL 0.97 - 0.82 0.88 0.88   BP/Weight 04/11/2016 03/25/2016 02/22/2016 02/22/2016 01/22/2016 12/21/2015 11/27/2015  Systolic BP 0000000 [id number] 123456 - [id number] 123456  Diastolic BP 88 69 92 - 67 67 82  Wt. (Lbs) 248.12 - - 254.5 - - 255.8  BMI 40.05 - - 41.08 - - 41.29   Foot/eye exam completion dates Latest Ref Rng & Units 04/11/2016 12/06/2015  Eye Exam No Retinopathy - No Retinopathy    Foot exam Order - - -  Foot Form Completion - Done -   Updated lab needed at/ before next visit.

## 2016-04-11 NOTE — Patient Instructions (Addendum)
F/u in 4. month, call if you need me sooner  Congrats on improved health  Non fast hBA1C , chem 7 and eGFR in 4 month  It is important that you exercise regularly at least 30 minutes 5 times a week. If you develop chest pain, have severe difficulty breathing, or feel very tired, stop exercising immediately and seek medical attention  Please work on good  health habits so that your health will improve. 1. Commitment to daily physical activity for 30 to 60  minutes, if you are able to do this.  2. Commitment to wise food choices. Aim for half of your  food intake to be vegetable and fruit, one quarter starchy foods, and one quarter protein. Try to eat on a regular schedule  3 meals per day, snacking between meals should be limited to vegetables or fruits or small portions of nuts. 64 ounces of water per day is generally recommended, unless you have specific health conditions, like heart failure or kidney failure where you will need to limit fluid intake.  3. Commitment to sufficient and a  good quality of physical and mental rest daily, generally between 6 to 8 hours per day.  WITH PERSISTANCE AND PERSEVERANCE, THE IMPOSSIBLE , BECOMES THE NORM!

## 2016-04-12 ENCOUNTER — Encounter: Payer: Commercial Managed Care - HMO | Admitting: Family Medicine

## 2016-04-23 ENCOUNTER — Encounter (HOSPITAL_COMMUNITY): Payer: Commercial Managed Care - HMO | Attending: Hematology & Oncology

## 2016-04-23 ENCOUNTER — Encounter (HOSPITAL_COMMUNITY): Payer: Self-pay

## 2016-04-23 VITALS — BP 128/70 | HR 75 | Temp 98.0°F | Resp 16

## 2016-04-23 DIAGNOSIS — Z803 Family history of malignant neoplasm of breast: Secondary | ICD-10-CM | POA: Insufficient documentation

## 2016-04-23 DIAGNOSIS — E669 Obesity, unspecified: Secondary | ICD-10-CM | POA: Insufficient documentation

## 2016-04-23 DIAGNOSIS — E538 Deficiency of other specified B group vitamins: Secondary | ICD-10-CM | POA: Diagnosis not present

## 2016-04-23 DIAGNOSIS — Z880 Allergy status to penicillin: Secondary | ICD-10-CM | POA: Insufficient documentation

## 2016-04-23 DIAGNOSIS — Z79899 Other long term (current) drug therapy: Secondary | ICD-10-CM | POA: Insufficient documentation

## 2016-04-23 DIAGNOSIS — Z833 Family history of diabetes mellitus: Secondary | ICD-10-CM | POA: Insufficient documentation

## 2016-04-23 DIAGNOSIS — Z823 Family history of stroke: Secondary | ICD-10-CM | POA: Insufficient documentation

## 2016-04-23 DIAGNOSIS — Z9049 Acquired absence of other specified parts of digestive tract: Secondary | ICD-10-CM | POA: Insufficient documentation

## 2016-04-23 DIAGNOSIS — E119 Type 2 diabetes mellitus without complications: Secondary | ICD-10-CM | POA: Insufficient documentation

## 2016-04-23 DIAGNOSIS — D509 Iron deficiency anemia, unspecified: Secondary | ICD-10-CM | POA: Insufficient documentation

## 2016-04-23 DIAGNOSIS — Z86010 Personal history of colonic polyps: Secondary | ICD-10-CM | POA: Insufficient documentation

## 2016-04-23 DIAGNOSIS — I10 Essential (primary) hypertension: Secondary | ICD-10-CM | POA: Insufficient documentation

## 2016-04-23 DIAGNOSIS — E785 Hyperlipidemia, unspecified: Secondary | ICD-10-CM | POA: Insufficient documentation

## 2016-04-23 DIAGNOSIS — Z888 Allergy status to other drugs, medicaments and biological substances status: Secondary | ICD-10-CM | POA: Insufficient documentation

## 2016-04-23 DIAGNOSIS — Z794 Long term (current) use of insulin: Secondary | ICD-10-CM | POA: Insufficient documentation

## 2016-04-23 DIAGNOSIS — Z7984 Long term (current) use of oral hypoglycemic drugs: Secondary | ICD-10-CM | POA: Insufficient documentation

## 2016-04-23 DIAGNOSIS — Z7982 Long term (current) use of aspirin: Secondary | ICD-10-CM | POA: Insufficient documentation

## 2016-04-23 DIAGNOSIS — Z9889 Other specified postprocedural states: Secondary | ICD-10-CM | POA: Insufficient documentation

## 2016-04-23 DIAGNOSIS — Z808 Family history of malignant neoplasm of other organs or systems: Secondary | ICD-10-CM | POA: Insufficient documentation

## 2016-04-23 MED ORDER — CYANOCOBALAMIN 1000 MCG/ML IJ SOLN
1000.0000 ug | Freq: Once | INTRAMUSCULAR | Status: AC
  Administered 2016-04-23: 1000 ug via INTRAMUSCULAR

## 2016-04-23 MED ORDER — CYANOCOBALAMIN 1000 MCG/ML IJ SOLN
INTRAMUSCULAR | Status: AC
  Filled 2016-04-23: qty 1

## 2016-04-23 NOTE — Progress Notes (Signed)
Helen Mahoney presents today for injection per MD orders. B12 1080mcg administered SQ in right Upper Arm. Administration without incident. Patient tolerated well.  VS stable, patient discharged ambulatory from clinic, follow as scheduled.

## 2016-04-23 NOTE — Patient Instructions (Signed)
[place] at [facility]  Discharge Instructions:  B12 today Follow up as scheduled _______________________________________________________________  Thank you for choosing [place] at [facility] to provide your oncology and hematology care.  To afford each patient quality time with our providers, please arrive at least 15 minutes before your scheduled appointment.  You need to re-schedule your appointment if you arrive 10 or more minutes late.  We strive to give you quality time with our providers, and arriving late affects you and other patients whose appointments are after yours.  Also, if you no show three or more times for appointments you may be dismissed from the clinic.  Again, thank you for choosing [place] [facility] [place] hope is that these requests will allow you access to exceptional care and in a timely manner. _______________________________________________________________  If you have questions after your visit, please contact our office at [phone] between the hours of 8:30 a.m. and 5:00 p.m. Voicemails left after 4:30 p.m. will not be returned until the following business day. _______________________________________________________________  For prescription refill requests, have your pharmacy contact our office. _______________________________________________________________  Recommendations made by the consultant and any test results will be sent to your referring physician. _______________________________________________________________

## 2016-05-15 ENCOUNTER — Other Ambulatory Visit: Payer: Self-pay | Admitting: Family Medicine

## 2016-05-24 ENCOUNTER — Encounter (HOSPITAL_COMMUNITY): Payer: Commercial Managed Care - HMO | Attending: Hematology & Oncology

## 2016-05-24 VITALS — BP 154/94 | HR 114 | Temp 98.2°F | Resp 20

## 2016-05-24 DIAGNOSIS — Z7982 Long term (current) use of aspirin: Secondary | ICD-10-CM | POA: Insufficient documentation

## 2016-05-24 DIAGNOSIS — E669 Obesity, unspecified: Secondary | ICD-10-CM | POA: Insufficient documentation

## 2016-05-24 DIAGNOSIS — I10 Essential (primary) hypertension: Secondary | ICD-10-CM | POA: Insufficient documentation

## 2016-05-24 DIAGNOSIS — E785 Hyperlipidemia, unspecified: Secondary | ICD-10-CM | POA: Insufficient documentation

## 2016-05-24 DIAGNOSIS — Z794 Long term (current) use of insulin: Secondary | ICD-10-CM | POA: Insufficient documentation

## 2016-05-24 DIAGNOSIS — D509 Iron deficiency anemia, unspecified: Secondary | ICD-10-CM | POA: Insufficient documentation

## 2016-05-24 DIAGNOSIS — Z79899 Other long term (current) drug therapy: Secondary | ICD-10-CM | POA: Insufficient documentation

## 2016-05-24 DIAGNOSIS — Z808 Family history of malignant neoplasm of other organs or systems: Secondary | ICD-10-CM | POA: Insufficient documentation

## 2016-05-24 DIAGNOSIS — Z833 Family history of diabetes mellitus: Secondary | ICD-10-CM | POA: Insufficient documentation

## 2016-05-24 DIAGNOSIS — Z7984 Long term (current) use of oral hypoglycemic drugs: Secondary | ICD-10-CM | POA: Insufficient documentation

## 2016-05-24 DIAGNOSIS — E538 Deficiency of other specified B group vitamins: Secondary | ICD-10-CM

## 2016-05-24 DIAGNOSIS — Z86010 Personal history of colonic polyps: Secondary | ICD-10-CM | POA: Insufficient documentation

## 2016-05-24 DIAGNOSIS — Z803 Family history of malignant neoplasm of breast: Secondary | ICD-10-CM | POA: Insufficient documentation

## 2016-05-24 DIAGNOSIS — Z888 Allergy status to other drugs, medicaments and biological substances status: Secondary | ICD-10-CM | POA: Insufficient documentation

## 2016-05-24 DIAGNOSIS — E119 Type 2 diabetes mellitus without complications: Secondary | ICD-10-CM | POA: Insufficient documentation

## 2016-05-24 DIAGNOSIS — Z9049 Acquired absence of other specified parts of digestive tract: Secondary | ICD-10-CM | POA: Insufficient documentation

## 2016-05-24 DIAGNOSIS — Z880 Allergy status to penicillin: Secondary | ICD-10-CM | POA: Insufficient documentation

## 2016-05-24 DIAGNOSIS — Z9889 Other specified postprocedural states: Secondary | ICD-10-CM | POA: Insufficient documentation

## 2016-05-24 DIAGNOSIS — Z823 Family history of stroke: Secondary | ICD-10-CM | POA: Insufficient documentation

## 2016-05-24 MED ORDER — CYANOCOBALAMIN 1000 MCG/ML IJ SOLN
1000.0000 ug | Freq: Once | INTRAMUSCULAR | Status: AC
  Administered 2016-05-24: 1000 ug via INTRAMUSCULAR

## 2016-05-24 MED ORDER — CYANOCOBALAMIN 1000 MCG/ML IJ SOLN
INTRAMUSCULAR | Status: AC
  Filled 2016-05-24: qty 1

## 2016-05-24 NOTE — Progress Notes (Signed)
Helen Mahoney presents today for injection per MD orders. B12 1000mcg administered IM in right Upper Arm. Administration without incident. Patient tolerated well.  

## 2016-05-24 NOTE — Patient Instructions (Signed)
[facility] at [facility] Discharge Instructions  RECOMMENDATIONS MADE BY THE CONSULTANT AND ANY TEST RESULTS WILL BE SENT TO YOUR REFERRING PHYSICIAN.  Vitamin B12 1000 mcg injection given as ordered. Return as scheduled.  Thank you for choosing [facility] at [facility] to provide your oncology and hematology care.  To afford each patient quality time with our provider, please arrive at least 15 minutes before your scheduled appointment time.    If you have a lab appointment with the Cancer Center please come in thru the  Main Entrance and check in at the main information desk  You need to re-schedule your appointment should you arrive 10 or more minutes late.  We strive to give you quality time with our providers, and arriving late affects you and other patients whose appointments are after yours.  Also, if you no show three or more times for appointments you may be dismissed from the clinic at the providers discretion.     Again, thank you for choosing [facility].  Our hope is that these requests will decrease the amount of time that you wait before being seen by our physicians.       _____________________________________________________________  Should you have questions after your visit to [facility], please contact our office at [phone] between the hours of 8:30 a.m. and 4:30 p.m.  Voicemails left after 4:30 p.m. will not be returned until the following business day.  For prescription refill requests, have your pharmacy contact our office.       Resources For Cancer Patients and their Caregivers ? American Cancer Society: Can assist with transportation, wigs, general needs, runs Look Good Feel Better.        1-888-227-6333 ? Cancer Care: Provides financial assistance, online support groups, medication/co-pay assistance.  1-800-813-HOPE (4673) ? [facility] Assists [place] cancer patients and their families through emotional , educational and financial support.  336-427-4357 ? [place] DSS Where to apply for food stamps, Medicaid and utility assistance. 336-342-1394 ? [employer]: Transportation to medical appointments. 336-347-2287 ? Social Security Administration: May apply for disability if have a Stage IV cancer. 336-342-7796 1-800-772-1213 ? [place] Aging, Disability and Transit Services: Assists with nutrition, care and transit needs. 336-349-2343  Cancer Center Support Programs: @[email or web address]@ > Cancer Support Group  2nd Tuesday of the month 1pm-2pm, Journey Room  > Creative Journey  3rd Tuesday of the month 1130am-1pm, Journey Room  > Look Good Feel Better  1st Wednesday of the month 10am-12 noon, Journey Room (Call American Cancer Society to register 1-800-395-5775)   

## 2016-06-24 ENCOUNTER — Encounter (HOSPITAL_COMMUNITY): Payer: Commercial Managed Care - HMO | Attending: Hematology & Oncology

## 2016-06-24 ENCOUNTER — Encounter (HOSPITAL_COMMUNITY): Payer: Self-pay

## 2016-06-24 VITALS — BP 145/81 | HR 98 | Temp 97.9°F | Resp 18

## 2016-06-24 DIAGNOSIS — Z808 Family history of malignant neoplasm of other organs or systems: Secondary | ICD-10-CM | POA: Insufficient documentation

## 2016-06-24 DIAGNOSIS — Z7982 Long term (current) use of aspirin: Secondary | ICD-10-CM | POA: Insufficient documentation

## 2016-06-24 DIAGNOSIS — Z880 Allergy status to penicillin: Secondary | ICD-10-CM | POA: Insufficient documentation

## 2016-06-24 DIAGNOSIS — Z823 Family history of stroke: Secondary | ICD-10-CM | POA: Insufficient documentation

## 2016-06-24 DIAGNOSIS — Z888 Allergy status to other drugs, medicaments and biological substances status: Secondary | ICD-10-CM | POA: Insufficient documentation

## 2016-06-24 DIAGNOSIS — Z803 Family history of malignant neoplasm of breast: Secondary | ICD-10-CM | POA: Insufficient documentation

## 2016-06-24 DIAGNOSIS — E538 Deficiency of other specified B group vitamins: Secondary | ICD-10-CM | POA: Diagnosis not present

## 2016-06-24 DIAGNOSIS — E785 Hyperlipidemia, unspecified: Secondary | ICD-10-CM | POA: Insufficient documentation

## 2016-06-24 DIAGNOSIS — Z9049 Acquired absence of other specified parts of digestive tract: Secondary | ICD-10-CM | POA: Insufficient documentation

## 2016-06-24 DIAGNOSIS — Z794 Long term (current) use of insulin: Secondary | ICD-10-CM | POA: Insufficient documentation

## 2016-06-24 DIAGNOSIS — Z86010 Personal history of colonic polyps: Secondary | ICD-10-CM | POA: Insufficient documentation

## 2016-06-24 DIAGNOSIS — Z9889 Other specified postprocedural states: Secondary | ICD-10-CM | POA: Insufficient documentation

## 2016-06-24 DIAGNOSIS — Z833 Family history of diabetes mellitus: Secondary | ICD-10-CM | POA: Insufficient documentation

## 2016-06-24 DIAGNOSIS — D509 Iron deficiency anemia, unspecified: Secondary | ICD-10-CM | POA: Insufficient documentation

## 2016-06-24 DIAGNOSIS — E119 Type 2 diabetes mellitus without complications: Secondary | ICD-10-CM | POA: Insufficient documentation

## 2016-06-24 DIAGNOSIS — Z7984 Long term (current) use of oral hypoglycemic drugs: Secondary | ICD-10-CM | POA: Insufficient documentation

## 2016-06-24 DIAGNOSIS — Z79899 Other long term (current) drug therapy: Secondary | ICD-10-CM | POA: Insufficient documentation

## 2016-06-24 DIAGNOSIS — E669 Obesity, unspecified: Secondary | ICD-10-CM | POA: Insufficient documentation

## 2016-06-24 DIAGNOSIS — I10 Essential (primary) hypertension: Secondary | ICD-10-CM | POA: Insufficient documentation

## 2016-06-24 MED ORDER — CYANOCOBALAMIN 1000 MCG/ML IJ SOLN
1000.0000 ug | Freq: Once | INTRAMUSCULAR | Status: AC
  Administered 2016-06-24: 1000 ug via INTRAMUSCULAR

## 2016-06-24 MED ORDER — CYANOCOBALAMIN 1000 MCG/ML IJ SOLN
INTRAMUSCULAR | Status: AC
  Filled 2016-06-24: qty 1

## 2016-06-24 NOTE — Progress Notes (Signed)
Helen Mahoney presents today for injection per the provider's orders.  B12 administration without incident; see MAR for injection details.  Patient tolerated procedure well and without incident.  No questions or complaints noted at this time.

## 2016-06-24 NOTE — Patient Instructions (Signed)
[facility] at [facility] Discharge Instructions  RECOMMENDATIONS MADE BY THE CONSULTANT AND ANY TEST RESULTS WILL BE SENT TO YOUR REFERRING PHYSICIAN.  B12 injection today. Return as scheduled.   Thank you for choosing [facility] at [facility] to provide your oncology and hematology care.  To afford each patient quality time with our provider, please arrive at least 15 minutes before your scheduled appointment time.    If you have a lab appointment with the Cancer Center please come in thru the  Main Entrance and check in at the main information desk  You need to re-schedule your appointment should you arrive 10 or more minutes late.  We strive to give you quality time with our providers, and arriving late affects you and other patients whose appointments are after yours.  Also, if you no show three or more times for appointments you may be dismissed from the clinic at the providers discretion.     Again, thank you for choosing [facility].  Our hope is that these requests will decrease the amount of time that you wait before being seen by our physicians.       _____________________________________________________________  Should you have questions after your visit to [facility], please contact our office at [phone] between the hours of 8:30 a.m. and 4:30 p.m.  Voicemails left after 4:30 p.m. will not be returned until the following business day.  For prescription refill requests, have your pharmacy contact our office.       Resources For Cancer Patients and their Caregivers ? American Cancer Society: Can assist with transportation, wigs, general needs, runs Look Good Feel Better.        1-888-227-6333 ? Cancer Care: Provides financial assistance, online support groups, medication/co-pay assistance.  1-800-813-HOPE (4673) ? [facility] Assists [place] cancer patients and their  families through emotional , educational and financial support.  336-427-4357 ? [place] DSS Where to apply for food stamps, Medicaid and utility assistance. 336-342-1394 ? [employer]: Transportation to medical appointments. 336-347-2287 ? Social Security Administration: May apply for disability if have a Stage IV cancer. 336-342-7796 1-800-772-1213 ? [place] Aging, Disability and Transit Services: Assists with nutrition, care and transit needs. 336-349-2343  Cancer Center Support Programs: @[email or web address]@ > Cancer Support Group  2nd Tuesday of the month 1pm-2pm, Journey Room  > Creative Journey  3rd Tuesday of the month 1130am-1pm, Journey Room  > Look Good Feel Better  1st Wednesday of the month 10am-12 noon, Journey Room (Call American Cancer Society to register 1-800-395-5775)   

## 2016-07-04 ENCOUNTER — Other Ambulatory Visit: Payer: Self-pay | Admitting: *Deleted

## 2016-07-04 ENCOUNTER — Telehealth: Payer: Self-pay | Admitting: Family Medicine

## 2016-07-04 MED ORDER — INSULIN PEN NEEDLE 31G X 8 MM MISC: 5 refills | Status: DC

## 2016-07-04 MED ORDER — INSULIN GLARGINE 100 UNIT/ML SOLOSTAR PEN: PEN_INJECTOR | SUBCUTANEOUS | 2 refills | Status: DC

## 2016-07-04 NOTE — Progress Notes (Signed)
Patient walked in requesting written prescription for Lantus and needles, states she can get cheaper if she takes in written prescription.

## 2016-07-05 NOTE — Telephone Encounter (Signed)
Opened in error

## 2016-07-06 ENCOUNTER — Other Ambulatory Visit: Payer: Self-pay | Admitting: Family Medicine

## 2016-07-16 ENCOUNTER — Other Ambulatory Visit: Payer: Self-pay | Admitting: Family Medicine

## 2016-07-16 NOTE — Telephone Encounter (Signed)
Last vit d level on 6 28 17  = 49  Do you wish to reorder this?

## 2016-07-17 ENCOUNTER — Other Ambulatory Visit: Payer: Self-pay | Admitting: Family Medicine

## 2016-07-22 ENCOUNTER — Encounter (HOSPITAL_COMMUNITY): Payer: Medicare HMO | Attending: Hematology & Oncology

## 2016-07-22 VITALS — BP 144/73 | HR 96 | Temp 98.4°F | Resp 18

## 2016-07-22 DIAGNOSIS — E669 Obesity, unspecified: Secondary | ICD-10-CM | POA: Insufficient documentation

## 2016-07-22 DIAGNOSIS — E538 Deficiency of other specified B group vitamins: Secondary | ICD-10-CM | POA: Insufficient documentation

## 2016-07-22 DIAGNOSIS — Z808 Family history of malignant neoplasm of other organs or systems: Secondary | ICD-10-CM | POA: Insufficient documentation

## 2016-07-22 DIAGNOSIS — Z7982 Long term (current) use of aspirin: Secondary | ICD-10-CM | POA: Insufficient documentation

## 2016-07-22 DIAGNOSIS — Z79899 Other long term (current) drug therapy: Secondary | ICD-10-CM | POA: Insufficient documentation

## 2016-07-22 DIAGNOSIS — Z9049 Acquired absence of other specified parts of digestive tract: Secondary | ICD-10-CM | POA: Insufficient documentation

## 2016-07-22 DIAGNOSIS — Z888 Allergy status to other drugs, medicaments and biological substances status: Secondary | ICD-10-CM | POA: Insufficient documentation

## 2016-07-22 DIAGNOSIS — Z7984 Long term (current) use of oral hypoglycemic drugs: Secondary | ICD-10-CM | POA: Insufficient documentation

## 2016-07-22 DIAGNOSIS — Z794 Long term (current) use of insulin: Secondary | ICD-10-CM | POA: Insufficient documentation

## 2016-07-22 DIAGNOSIS — Z9889 Other specified postprocedural states: Secondary | ICD-10-CM | POA: Insufficient documentation

## 2016-07-22 DIAGNOSIS — Z803 Family history of malignant neoplasm of breast: Secondary | ICD-10-CM | POA: Insufficient documentation

## 2016-07-22 DIAGNOSIS — I10 Essential (primary) hypertension: Secondary | ICD-10-CM | POA: Insufficient documentation

## 2016-07-22 DIAGNOSIS — E785 Hyperlipidemia, unspecified: Secondary | ICD-10-CM | POA: Insufficient documentation

## 2016-07-22 DIAGNOSIS — Z880 Allergy status to penicillin: Secondary | ICD-10-CM | POA: Insufficient documentation

## 2016-07-22 DIAGNOSIS — Z823 Family history of stroke: Secondary | ICD-10-CM | POA: Insufficient documentation

## 2016-07-22 DIAGNOSIS — Z833 Family history of diabetes mellitus: Secondary | ICD-10-CM | POA: Insufficient documentation

## 2016-07-22 DIAGNOSIS — D509 Iron deficiency anemia, unspecified: Secondary | ICD-10-CM | POA: Insufficient documentation

## 2016-07-22 DIAGNOSIS — Z86010 Personal history of colonic polyps: Secondary | ICD-10-CM | POA: Insufficient documentation

## 2016-07-22 DIAGNOSIS — E119 Type 2 diabetes mellitus without complications: Secondary | ICD-10-CM | POA: Insufficient documentation

## 2016-07-22 MED ORDER — CYANOCOBALAMIN 1000 MCG/ML IJ SOLN
1000.0000 ug | Freq: Once | INTRAMUSCULAR | Status: AC
  Administered 2016-07-22: 1000 ug via INTRAMUSCULAR
  Filled 2016-07-22: qty 1

## 2016-07-22 NOTE — Progress Notes (Signed)
Helen Mahoney presents today for injection per MD orders. B12 1000 mcg administered IM in left Upper Arm. Administration without incident. Patient tolerated well.

## 2016-07-22 NOTE — Patient Instructions (Signed)
[facility] at [facility] Discharge Instructions  RECOMMENDATIONS MADE BY THE CONSULTANT AND ANY TEST RESULTS WILL BE SENT TO YOUR REFERRING PHYSICIAN.  Vitamin B12 1000 mcg injection given as ordered. Return as scheduled.  Thank you for choosing [facility] at [facility] to provide your oncology and hematology care.  To afford each patient quality time with our provider, please arrive at least 15 minutes before your scheduled appointment time.    If you have a lab appointment with the Cancer Center please come in thru the  Main Entrance and check in at the main information desk  You need to re-schedule your appointment should you arrive 10 or more minutes late.  We strive to give you quality time with our providers, and arriving late affects you and other patients whose appointments are after yours.  Also, if you no show three or more times for appointments you may be dismissed from the clinic at the providers discretion.     Again, thank you for choosing [facility].  Our hope is that these requests will decrease the amount of time that you wait before being seen by our physicians.       _____________________________________________________________  Should you have questions after your visit to [facility], please contact our office at [phone] between the hours of 8:30 a.m. and 4:30 p.m.  Voicemails left after 4:30 p.m. will not be returned until the following business day.  For prescription refill requests, have your pharmacy contact our office.       Resources For Cancer Patients and their Caregivers ? American Cancer Society: Can assist with transportation, wigs, general needs, runs Look Good Feel Better.        1-888-227-6333 ? Cancer Care: Provides financial assistance, online support groups, medication/co-pay assistance.  1-800-813-HOPE (4673) ? [facility] Assists [place] cancer patients and their families through emotional , educational and financial support.  336-427-4357 ? [place] DSS Where to apply for food stamps, Medicaid and utility assistance. 336-342-1394 ? [employer]: Transportation to medical appointments. 336-347-2287 ? Social Security Administration: May apply for disability if have a Stage IV cancer. 336-342-7796 1-800-772-1213 ? [place] Aging, Disability and Transit Services: Assists with nutrition, care and transit needs. 336-349-2343  Cancer Center Support Programs: @[email or web address]@ > Cancer Support Group  2nd Tuesday of the month 1pm-2pm, Journey Room  > Creative Journey  3rd Tuesday of the month 1130am-1pm, Journey Room  > Look Good Feel Better  1st Wednesday of the month 10am-12 noon, Journey Room (Call American Cancer Society to register 1-800-395-5775)   

## 2016-07-29 IMAGING — DX DG KNEE COMPLETE 4+V*R*
4 series · 4 of 4 positions shown · non-contrast
Comparison: None.

CLINICAL DATA: Status post fall and car port 3 months ago with
persistent pain over the infrapatellar region, initial visit.

EXAM:
RIGHT KNEE - COMPLETE 4+ VIEW

[knee ap]
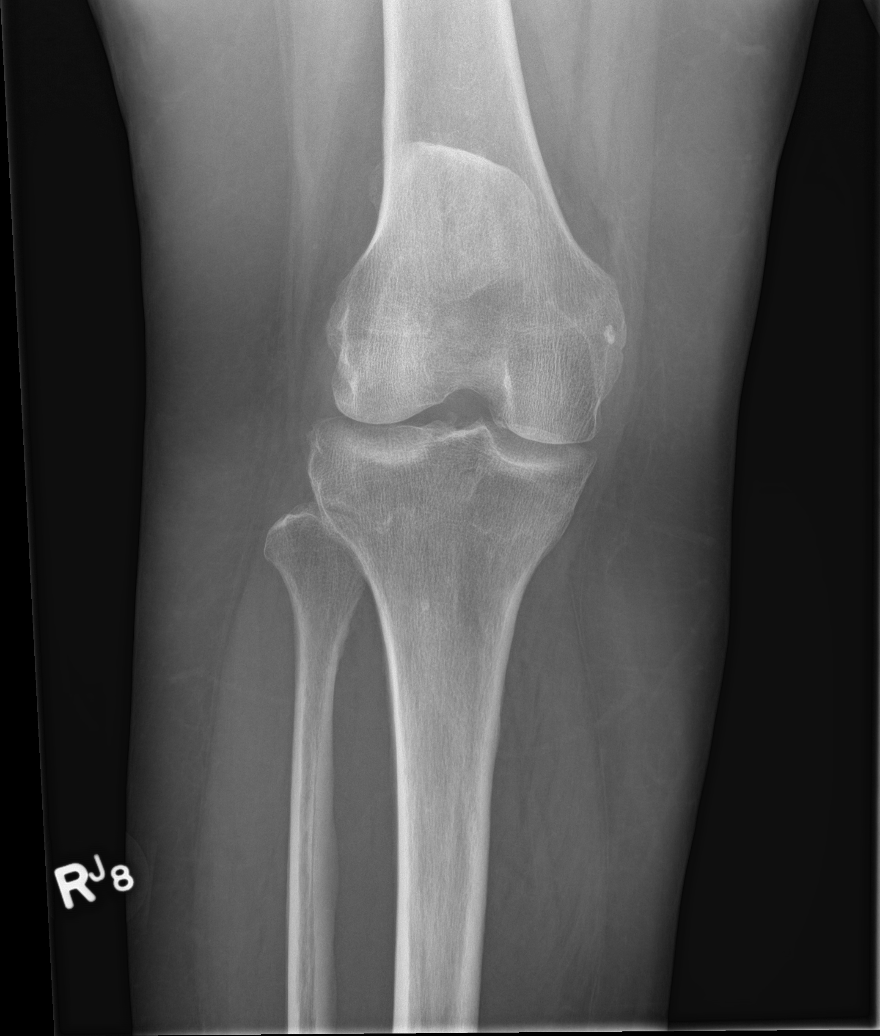

[knee obl (1 of 2)]
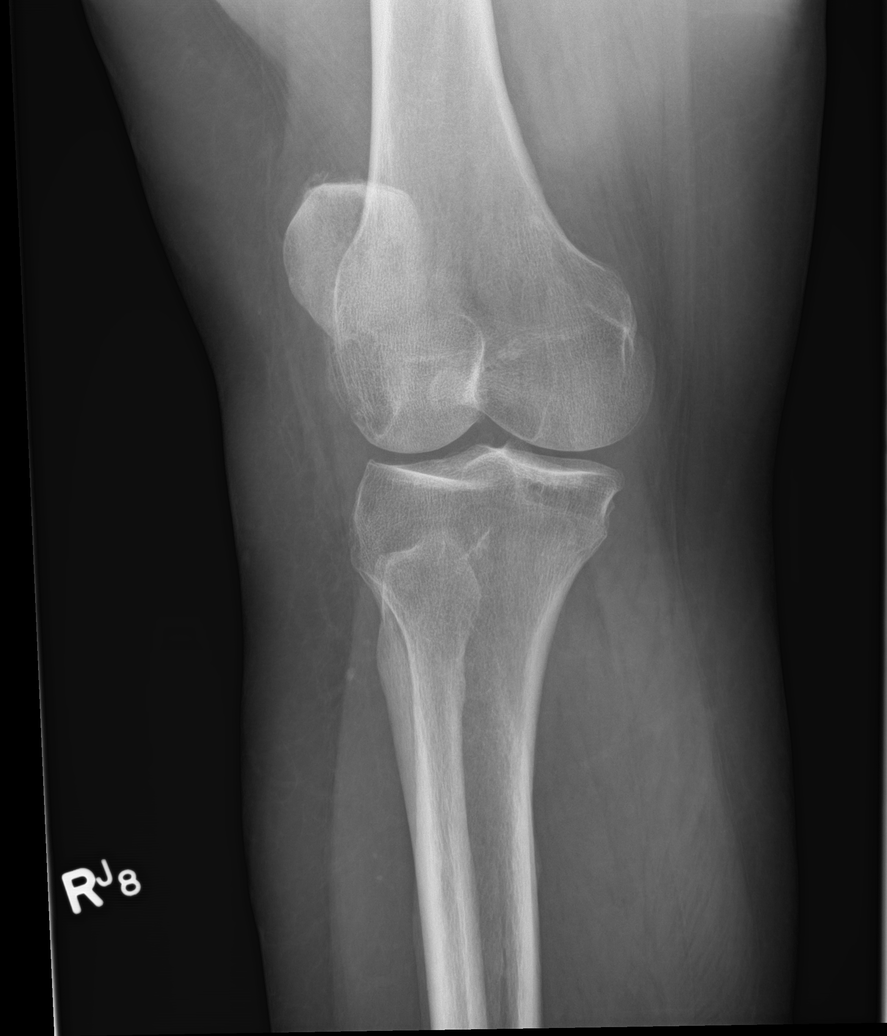

[knee obl (2 of 2)]
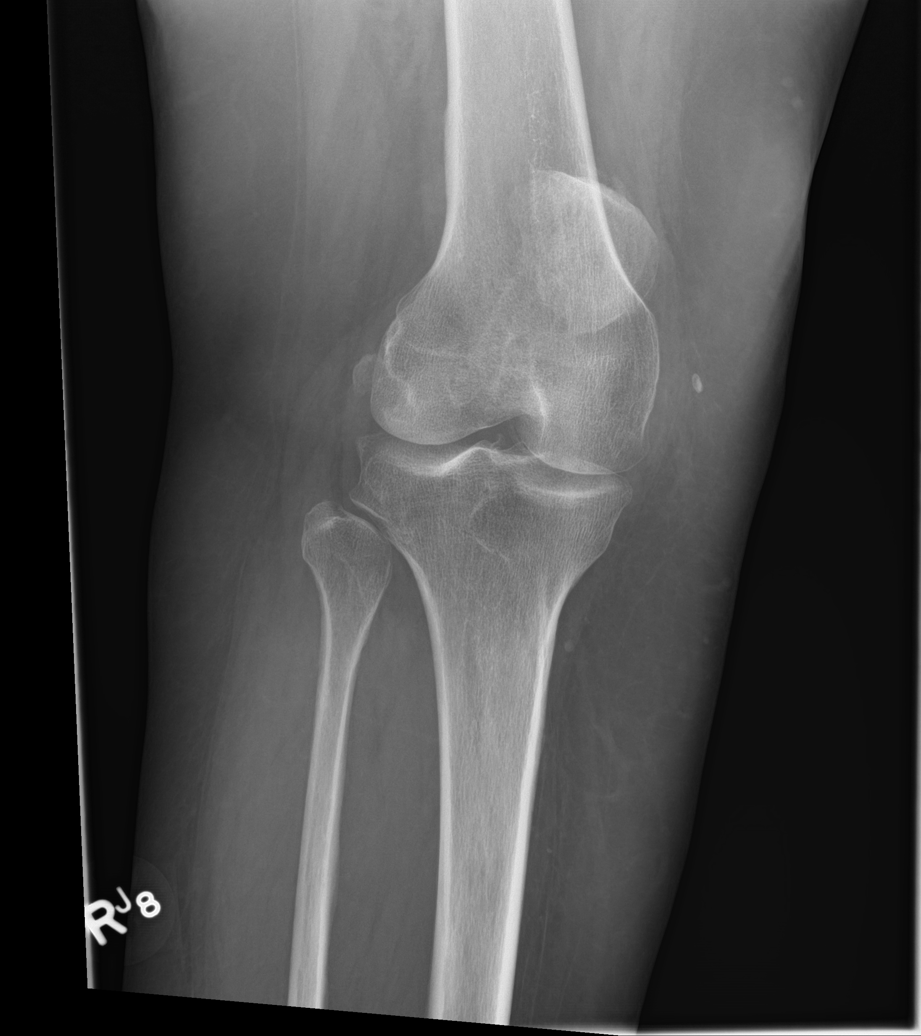

[knee lat]
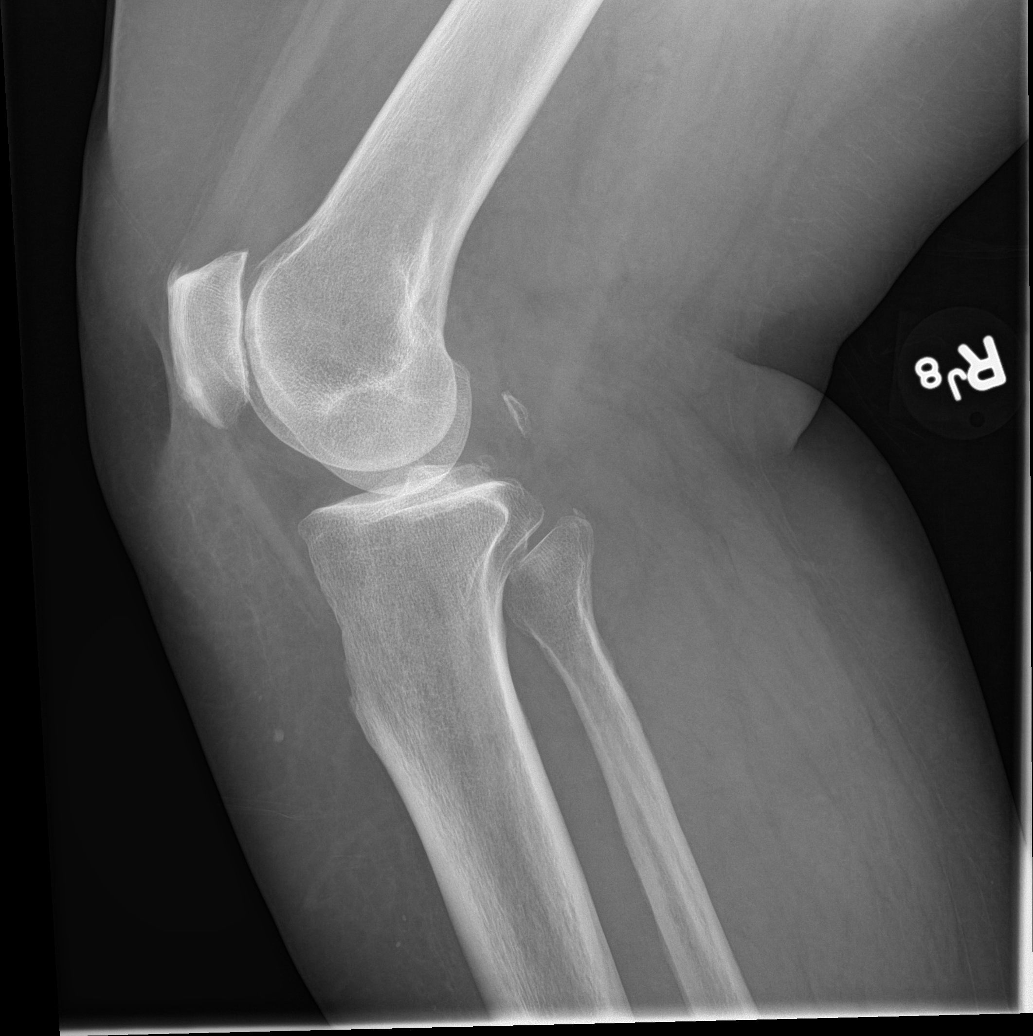

[4 of 4 positions shown; findings below may reference images not displayed]

FINDINGS: The bones of the right knee are adequately mineralized for age.
There is no acute or old fracture. The medial and lateral joint
complaints is are reasonably well maintained on this
nonweightbearing series. There is mild narrowing of the
patellofemoral compartment. There is a tiny spur from the superior
articular margin of the patella. There is no chondrocalcinosis or
joint effusion.
IMPRESSION: There is no acute or old fracture of the right knee. There are mild
osteoarthritic changes centered in the patellofemoral compartment.

## 2016-08-02 DIAGNOSIS — E119 Type 2 diabetes mellitus without complications: Secondary | ICD-10-CM | POA: Diagnosis not present

## 2016-08-02 DIAGNOSIS — Z794 Long term (current) use of insulin: Secondary | ICD-10-CM | POA: Diagnosis not present

## 2016-08-03 LAB — BASIC METABOLIC PANEL WITH GFR
BUN: 10 mg/dL (ref 7–25)
CHLORIDE: 102 mmol/L (ref 98–110)
CO2: 30 mmol/L (ref 20–31)
CREATININE: 0.87 mg/dL (ref 0.50–0.99)
Calcium: 9.3 mg/dL (ref 8.6–10.4)
GFR, Est African American: 80 mL/min (ref >=60)
GFR, Est Non African American: 70 mL/min (ref >=60)
Glucose, Bld: 145 mg/dL — ABNORMAL HIGH (ref 65–99)
Potassium: 4.5 mmol/L (ref 3.5–5.3)
Sodium: 140 mmol/L (ref 135–146)

## 2016-08-03 LAB — HEMOGLOBIN A1C
Hgb A1c MFr Bld: 8.2 % — ABNORMAL HIGH (ref <5.7)
Mean Plasma Glucose: 189 mg/dL

## 2016-08-20 ENCOUNTER — Ambulatory Visit (INDEPENDENT_AMBULATORY_CARE_PROVIDER_SITE_OTHER): Payer: Medicare HMO | Admitting: Family Medicine

## 2016-08-20 ENCOUNTER — Encounter: Payer: Self-pay | Admitting: Family Medicine

## 2016-08-20 VITALS — BP 120/82 | HR 101 | Resp 16 | Ht 66.0 in | Wt 249.0 lb

## 2016-08-20 DIAGNOSIS — E119 Type 2 diabetes mellitus without complications: Secondary | ICD-10-CM

## 2016-08-20 DIAGNOSIS — Z794 Long term (current) use of insulin: Secondary | ICD-10-CM

## 2016-08-20 DIAGNOSIS — I10 Essential (primary) hypertension: Secondary | ICD-10-CM | POA: Diagnosis not present

## 2016-08-20 DIAGNOSIS — E559 Vitamin D deficiency, unspecified: Secondary | ICD-10-CM | POA: Diagnosis not present

## 2016-08-20 DIAGNOSIS — E785 Hyperlipidemia, unspecified: Secondary | ICD-10-CM

## 2016-08-20 DIAGNOSIS — IMO0001 Reserved for inherently not codable concepts without codable children: Secondary | ICD-10-CM

## 2016-08-20 MED ORDER — INSULIN GLARGINE 100 UNIT/ML SOLOSTAR PEN: 40.0000 [IU] | PEN_INJECTOR | Freq: Every day | SUBCUTANEOUS | 99 refills | Status: DC

## 2016-08-20 NOTE — Assessment & Plan Note (Signed)
Hyperlipidemia:Low fat diet discussed and encouraged.   Lipid Panel  Lab Results  Component Value Date   CHOL 175 03/28/2016   HDL 50 (L) 03/28/2016   LDLCALC 108 (H) 03/28/2016   TRIG 84 03/28/2016   CHOLHDL 3.5 03/28/2016   Not at goal Updated lab needed at/ before next visit.

## 2016-08-20 NOTE — Assessment & Plan Note (Signed)
Controlled, no change in medication DASH diet and commitment to daily physical activity for a minimum of 30 minutes discussed and encouraged, as a part of hypertension management. The importance of attaining a healthy weight is also discussed.  BP/Weight 08/20/2016 07/22/2016 06/24/2016 05/24/2016 04/23/2016 04/11/2016 41/42/3953  Systolic BP 202 334 356 861 [phone]  Diastolic BP 82 73 81 94 70 88 69  Wt. (Lbs) 249 - - - - 248.12 -  BMI 40.19 - - - - 40.05 -

## 2016-08-20 NOTE — Assessment & Plan Note (Signed)
Deteriorated. Patient re-educated about  the importance of commitment to a  minimum of 150 minutes of exercise per week.  The importance of healthy food choices with portion control discussed. Encouraged to start a food diary, count calories and to consider  joining a support group. Sample diet sheets offered. Goals set by the patient for the next several months.   Weight /BMI 08/20/2016 04/11/2016 02/22/2016  WEIGHT 249 lb 248 lb 1.9 oz 254 lb 8 oz  HEIGHT 5\' 6"  5\' 6"  -  BMI 40.19 kg/m2 40.05 kg/m2 41.08 kg/m2

## 2016-08-20 NOTE — Progress Notes (Signed)
Helen Mahoney     MRN: [id number]      DOB: Oct 27, 1950   HPI Helen Mahoney is here for follow up and re-evaluation of chronic medical conditions, medication management and review of any available recent lab and radiology data.  Preventive health is updated, specifically  Cancer screening and Immunization.   Questions or concerns regarding consultations or procedures which the PT has had in the interim are  addressed. The PT denies any adverse reactions to current medications since the last visit.  Reports non compliance with diet  And lack of exercise fasting sugars generally 160 ROS Denies recent fever or chills. Denies sinus pressure, nasal congestion, ear pain or sore throat. Denies chest congestion, productive cough or wheezing. Denies chest pains, palpitations and leg swelling Denies abdominal pain, nausea, vomiting,diarrhea or constipation.   Denies dysuria, frequency, hesitancy or incontinence. Denies joint pain, swelling and limitation in mobility. Denies headaches, seizures, numbness, or tingling. Denies depression, anxiety or insomnia. Denies skin break down or rash.   PE  BP 120/82   Pulse (!) 101   Resp 16   Ht 5\' 6"  (1.676 m)   Wt 249 lb (112.9 kg)   SpO2 95%   BMI 40.19 kg/m   Patient alert and oriented and in no cardiopulmonary distress.  HEENT: No facial asymmetry, EOMI,   oropharynx pink and moist.  Neck supple no JVD, no mass.  Chest: Clear to auscultation bilaterally.  CVS: S1, S2 no murmurs, no S3.Regular rate.  ABD: Soft non tender.   Ext: No edema  MS: Adequate ROM spine, shoulders, hips and knees.  Skin: Intact, no ulcerations or rash noted.  Psych: Good eye contact, normal affect. Memory intact not anxious or depressed appearing.  CNS: CN 2-12 intact, power,  normal throughout.no focal deficits noted.   Assessment & Plan  Essential hypertension Controlled, no change in medication DASH diet and commitment to daily physical  activity for a minimum of 30 minutes discussed and encouraged, as a part of hypertension management. The importance of attaining a healthy weight is also discussed.  BP/Weight 08/20/2016 07/22/2016 06/24/2016 05/24/2016 04/23/2016 04/11/2016 97/58/8325  Systolic BP 498 264 158 309 407 680 881  Diastolic BP 82 73 81 94 70 88 69  Wt. (Lbs) 249 - - - - 248.12 -  BMI 40.19 - - - - 40.05 -       Diabetes mellitus, insulin dependent (IDDM), uncontrolled (HCC) Deteriorated  DASH diet and commitment to daily physical activity for a minimum of 30 minutes discussed and encouraged, as a part of hypertension management. The importance of attaining a healthy weight is also discussed.  BP/Weight 08/20/2016 07/22/2016 06/24/2016 05/24/2016 04/23/2016 04/11/2016 02/27/5944  Systolic BP 859 292 446 286 381 771 165  Diastolic BP 82 73 81 94 70 88 69  Wt. (Lbs) 249 - - - - 248.12 -  BMI 40.19 - - - - 40.05 -    Increase med dose and change lifestyle, counselled for approx 15 mins  Hyperlipidemia LDL goal <100 Hyperlipidemia:Low fat diet discussed and encouraged.   Lipid Panel  Lab Results  Component Value Date   CHOL 175 03/28/2016   HDL 50 (L) 03/28/2016   LDLCALC 108 (H) 03/28/2016   TRIG 84 03/28/2016   CHOLHDL 3.5 03/28/2016   Not at goal Updated lab needed at/ before next visit.     Morbid obesity Deteriorated. Patient re-educated about  the importance of commitment to a  minimum of 150 minutes of exercise  per week.  The importance of healthy food choices with portion control discussed. Encouraged to start a food diary, count calories and to consider  joining a support group. Sample diet sheets offered. Goals set by the patient for the next several months.   Weight /BMI 08/20/2016 04/11/2016 02/22/2016  WEIGHT 249 lb 248 lb 1.9 oz 254 lb 8 oz  HEIGHT 5\' 6"  5\' 6"  -  BMI 40.19 kg/m2 40.05 kg/m2 41.08 kg/m2      Vitamin D deficiency Updated lab needed at/ before next  visit.

## 2016-08-20 NOTE — Assessment & Plan Note (Signed)
Updated lab needed at/ before next visit.   

## 2016-08-20 NOTE — Patient Instructions (Signed)
f/u in 3 month, call if you need me sooner  Please change eating and drinking habits  Pls commit to 3 times daily exercise 10  mins after each meal  Pls eat between 7am and 7 pm only  No diet drink  only water  Increase lantus to 40 units  Test 2 to 4 times daily  Goal for fasting blood sugar ranges from 80 to 130 and 2 hours after any meal or at bedtime should be between 130 to 170.   After you finish weekly vit D3 change to once daily vit D3 2000 IU   Fasting lipid, cmp and EGFR, hBA1C, TSH, Vit D3  Three to 5 days before next visit  YOU CAN DO THIS!  Thank you  for choosing [facility]. We consider it a privelige to serve you.  Delivering excellent health care in a caring and  compassionate way is our goal.  Partnering with you,  so that together we can achieve this goal is our strategy.

## 2016-08-20 NOTE — Assessment & Plan Note (Signed)
Deteriorated  DASH diet and commitment to daily physical activity for a minimum of 30 minutes discussed and encouraged, as a part of hypertension management. The importance of attaining a healthy weight is also discussed.  BP/Weight 08/20/2016 07/22/2016 06/24/2016 05/24/2016 04/23/2016 04/11/2016 29/29/0903  Systolic BP 014 996 924 932 419 914 445  Diastolic BP 82 73 81 94 70 88 69  Wt. (Lbs) 249 - - - - 248.12 -  BMI 40.19 - - - - 40.05 -    Increase med dose and change lifestyle, counselled for approx 15 mins

## 2016-08-22 ENCOUNTER — Encounter (HOSPITAL_COMMUNITY): Payer: Medicare HMO

## 2016-08-22 ENCOUNTER — Encounter (HOSPITAL_COMMUNITY): Payer: Medicare HMO | Attending: Oncology | Admitting: Oncology

## 2016-08-22 ENCOUNTER — Encounter (HOSPITAL_BASED_OUTPATIENT_CLINIC_OR_DEPARTMENT_OTHER): Payer: Medicare HMO

## 2016-08-22 ENCOUNTER — Encounter (HOSPITAL_COMMUNITY): Payer: Self-pay

## 2016-08-22 VITALS — BP 127/55 | HR 90 | Temp 97.6°F | Resp 18 | Wt 250.1 lb

## 2016-08-22 DIAGNOSIS — E538 Deficiency of other specified B group vitamins: Secondary | ICD-10-CM

## 2016-08-22 DIAGNOSIS — D508 Other iron deficiency anemias: Secondary | ICD-10-CM | POA: Diagnosis not present

## 2016-08-22 LAB — CBC WITH DIFFERENTIAL/PLATELET
Basophils Absolute: 0 10*3/uL (ref 0.0–0.1)
Basophils Relative: 0 %
EOS ABS: 0.4 10*3/uL (ref 0.0–0.7)
Eosinophils Relative: 4 %
HCT: 35.8 % — ABNORMAL LOW (ref 36.0–46.0)
Hemoglobin: 11 g/dL — ABNORMAL LOW (ref 12.0–15.0)
LYMPHS ABS: 2.2 10*3/uL (ref 0.7–4.0)
Lymphocytes Relative: 23 %
MCH: 21.4 pg — ABNORMAL LOW (ref 26.0–34.0)
MCHC: 30.7 g/dL (ref 30.0–36.0)
MCV: 69.8 fL — ABNORMAL LOW (ref 78.0–100.0)
Monocytes Absolute: 0.6 10*3/uL (ref 0.1–1.0)
Monocytes Relative: 6 %
NEUTROS PCT: 67 %
Neutro Abs: 6.5 10*3/uL (ref 1.7–7.7)
PLATELETS: 300 10*3/uL (ref 150–400)
RBC: 5.13 MIL/uL — ABNORMAL HIGH (ref 3.87–5.11)
RDW: 16.5 % — ABNORMAL HIGH (ref 11.5–15.5)
WBC: 9.7 10*3/uL (ref 4.0–10.5)

## 2016-08-22 LAB — COMPREHENSIVE METABOLIC PANEL
ALT: 20 U/L (ref 14–54)
AST: 23 U/L (ref 15–41)
Albumin: 3.3 g/dL — ABNORMAL LOW (ref 3.5–5.0)
Alkaline Phosphatase: 115 U/L (ref 38–126)
Anion gap: 11 (ref 5–15)
BUN: 13 mg/dL (ref 6–20)
CHLORIDE: 101 mmol/L (ref 101–111)
CO2: 24 mmol/L (ref 22–32)
CREATININE: 1.04 mg/dL — AB (ref 0.44–1.00)
Calcium: 8.9 mg/dL (ref 8.9–10.3)
GFR calc Af Amer: >60 mL/min (ref >60)
GFR, EST NON AFRICAN AMERICAN: 55 mL/min — AB (ref >60)
GLUCOSE: 212 mg/dL — AB (ref 65–99)
Potassium: 3.7 mmol/L (ref 3.5–5.1)
SODIUM: 136 mmol/L (ref 135–145)
Total Bilirubin: 0.4 mg/dL (ref 0.3–1.2)
Total Protein: 7.2 g/dL (ref 6.5–8.1)

## 2016-08-22 LAB — VITAMIN B12: VITAMIN B 12: 611 pg/mL (ref 180–914)

## 2016-08-22 LAB — IRON AND TIBC
Iron: 47 ug/dL (ref 28–170)
Saturation Ratios: 15 % (ref 10.4–31.8)
TIBC: 315 ug/dL (ref 250–450)
UIBC: 268 ug/dL

## 2016-08-22 LAB — FERRITIN: FERRITIN: 115 ng/mL (ref 11–307)

## 2016-08-22 MED ORDER — CYANOCOBALAMIN 1000 MCG/ML IJ SOLN
1000.0000 ug | Freq: Once | INTRAMUSCULAR | Status: AC
  Administered 2016-08-22: 1000 ug via INTRAMUSCULAR

## 2016-08-22 MED ORDER — CYANOCOBALAMIN 1000 MCG/ML IJ SOLN
INTRAMUSCULAR | Status: AC
  Filled 2016-08-22: qty 1

## 2016-08-22 NOTE — Patient Instructions (Signed)
[place] at [facility] Discharge Instructions  RECOMMENDATIONS MADE BY THE CONSULTANT AND ANY TEST RESULTS WILL BE SENT TO YOUR REFERRING PHYSICIAN.  You were seen today by Dr. Twana First Continue B-12 monthly Follow up in 6 months with lab work   Thank you for choosing [place] at [facility] to provide your oncology and hematology care.  To afford each patient quality time with our provider, please arrive at least 15 minutes before your scheduled appointment time.    If you have a lab appointment with the [place] please come in thru the  Main Entrance and check in at the main information desk  You need to re-schedule your appointment should you arrive 10 or more minutes late.  We strive to give you quality time with our providers, and arriving late affects you and other patients whose appointments are after yours.  Also, if you no show three or more times for appointments you may be dismissed from the clinic at the providers discretion.     Again, thank you for choosing [facility].  Our hope is that these requests will decrease the amount of time that you wait before being seen by our physicians.       _____________________________________________________________  Should you have questions after your visit to [facility], please contact our office at [phone] between the hours of 8:30 a.m. and 4:30 p.m.  Voicemails left after 4:30 p.m. will not be returned until the following business day.  For prescription refill requests, have your pharmacy contact our office.       Resources For Cancer Patients and their Caregivers ? American Cancer Society: Can assist with transportation, wigs, general needs, runs Look Good Feel Better.        ([phone] ? Cancer Care: Provides financial assistance, online support groups, medication/co-pay assistance.  1-800-813-HOPE 313-062-3582) ? [place] Assists [place] cancer patients and their families through emotional , educational and financial support.  [phone] ? [place] DSS Where to apply for food stamps, Medicaid and utility assistance. ([phone] [phone] ? RCATS: Transportation to medical appointments. [phone] ? Social Security Administration: May apply for disability if have a Stage IV cancer. 778-486-6384 423-439-9828 ? [employer], Disability and Transit Services: Assists with nutrition, care and transit needs. [place] Support Programs: @10RELATIVEDAYS @ > Cancer Support Group  2nd Tuesday of the month 1pm-2pm, Journey Room  > Creative Journey  3rd Tuesday of the month 1130am-1pm, Journey Room  > Look Good Feel Better  1st Wednesday of the month 10am-12 noon, Journey Room (Call [place] to register [phone])

## 2016-08-22 NOTE — Progress Notes (Signed)
[place] at [place] Note  Patient Care Team: Fayrene Helper, MD as PCP - General Danie Binder, MD as Consulting Physician (Gastroenterology) Patrici Ranks, MD as Consulting Physician (Hematology and Oncology)  CHIEF COMPLAINTS/PURPOSE OF CONSULTATION:  Microcytic Anemia B12 deficiency  HISTORY OF PRESENTING ILLNESS:  Helen Mahoney 66 y.o. female is here for follow-up of microcytic anemia.  She is doing well today. Her PCP took her off of her BP medication. She has been very tired, but this is her normal. Denies blood in stool, melena, chest pain, SOB, abdominal pain, weight loss, loss of appetite, or any other concerns.   MEDICAL HISTORY:  Past Medical History:  Diagnosis Date  . 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 B12 deficiency 06/16/2015  . Colon polyps   . Diabetes mellitus, type 2 ([place])   . Hyperlipidemia   . Hypertension   . Microcytic anemia 05/18/2015   2013: TCS/EGD 2017: TCS/EGD HYPERPLASTIC GASTRIC POLYPS, LYMPHOCYTIC GASTRITIS   . Obesity     SURGICAL HISTORY: Past Surgical History:  Procedure Laterality Date  . bilateral tubal ligation  1979  . CHOLECYSTECTOMY  2001   ACUTE CHOLECYSTITIS/GALLSTONES  . COLONOSCOPY  05/14/2011   Dr. Oneida Alar: sessile polyp in sigmoid colon, internal hemorrhoids, hyerplastic polyps  . COLONOSCOPY N/A 05/15/2015   Procedure: COLONOSCOPY;  Surgeon: Danie Binder, MD;  Location: [facility];  Service: Endoscopy;  Laterality: N/A;  0830  . COLONOSCOPY W/ POLYPECTOMY  NOV 2008 MJ ANEMIA   POLYP NO RETRIEVED  . COLONOSCOPY W/ POLYPECTOMY  2006 DR. SMTH   POLYP?  . ESOPHAGOGASTRODUODENOSCOPY  05/14/2011   Dr. Oneida Alar: sessile polyps in the cardia, mild gastritis. Chronic duodenitis consistent with peptic duodenitis, chronic active H.pylori gastritis.   Marland Kitchen ESOPHAGOGASTRODUODENOSCOPY N/A 05/15/2015     Procedure: ESOPHAGOGASTRODUODENOSCOPY (EGD);  Surgeon: Danie Binder, MD;  Location: [facility];  Service: Endoscopy;  Laterality: N/A;  . GIVENS CAPSULE STUDY N/A 06/02/2015   Procedure: GIVENS CAPSULE STUDY;  Surgeon: Danie Binder, MD;  Location: [facility];  Service: Endoscopy;  Laterality: N/A;  0700  . TUBAL LIGATION    . UPPER GASTROINTESTINAL ENDOSCOPY  NOV 2008 MJ ANEMIA   NL EXAM, urease neg    SOCIAL HISTORY: Social History   Social History  . Marital status: Married    Spouse name: N/A  . Number of children: N/A  . Years of education: N/A   Occupational History  . Not on file.   Social History Main Topics  . Smoking status: Never Smoker  . Smokeless tobacco: Never Used  . Alcohol use No  . Drug use: No  . Sexual activity: Not Currently   Other Topics Concern  . Not on file   Social History Narrative  . No narrative on file  Married 45 years 2 children, both boys 4 grandchildren She does not work outside the home She enjoys shopping Non smoker ETOH, none  FAMILY HISTORY: Family History  Problem Relation Age of Onset  . Cancer Mother     breast   . Heart disease Mother   . Diabetes Mother   . Hypertension Mother   . Stroke Mother   . Diabetes Father   . Heart attack Father   . Kidney disease Father   . Hypertension Sister   . Diabetes Sister   . Diabetes Sister   . Mental illness  Sister   . Hypertension Sister   . Mental illness Sister   . Diabetes Sister   . Hypertension Sister   . Mental illness Sister   . Colon cancer Sister   . Diabetes Brother    indicated that her mother is deceased. She indicated that her father is deceased. She indicated that all of her four sisters are alive. She indicated that both of her brothers are deceased. She indicated that both of her sons are alive.   Mother deceased in her 107s Father deceased in his 60s. One parent died of heart trouble and another of pneumonia Her sibling died of excessive  drinking Her sister had rectal colon cancer, diagnosed 6-7 years ago. She had surgery with Dr. Laural Golden. No chemotherapy or radiation treatment.  ALLERGIES:  is allergic to tetanus toxoids and penicillins.  MEDICATIONS:  Current Outpatient Prescriptions  Medication Sig Dispense Refill  . aspirin 81 MG tablet Take 81 mg by mouth daily.    . Calcium Citrate-Vitamin D (CITRACAL PETITES/VITAMIN D) 200-250 MG-UNIT TABS Take 1 tablet by mouth daily.     . Cholecalciferol (VITAMIN D3) 50000 units CAPS TAKE 1 CAPSULE ONE TIME WEEKLY 12 capsule 1  . Ferrous Sulfate (IRON) 325 (65 FE) MG TABS Take 1 tablet by mouth daily. 100 each 2  . glipiZIDE (GLUCOTROL) 10 MG tablet TAKE 1 TABLET TWICE DAILY BEFORE MEALS 180 tablet 1  . Insulin Glargine (LANTUS SOLOSTAR) 100 UNIT/ML Solostar Pen Inject 40 Units into the skin daily at 10 pm. 5 pen PRN  . Insulin Pen Needle (RELION SHORT PEN NEEDLES) 31G X 8 MM MISC USE ONE ONCE DAILY 50 each 5  . metFORMIN (GLUCOPHAGE) 1000 MG tablet TAKE 1 TABLET TWICE DAILY WITH MEALS 180 tablet 1  . Multiple Vitamin (MULTIVITAMINS PO) Take 1 tablet by mouth daily.     . pravastatin (PRAVACHOL) 80 MG tablet TAKE 1 TABLET ONE TIME DAILY IN THE EVENING 90 tablet 1  . TRUE METRIX BLOOD GLUCOSE TEST test strip TEST THREE TIMES DAILY 300 each 5   No current facility-administered medications for this visit.     Review of Systems  Constitutional: Positive for malaise/fatigue. Negative for weight loss.  HENT: Negative.   Eyes: Negative.   Respiratory: Negative.  Negative for shortness of breath.   Cardiovascular: Negative.  Negative for chest pain.  Gastrointestinal: Negative.  Negative for abdominal pain, blood in stool and melena.  Genitourinary: Negative.   Musculoskeletal: Negative.   Skin: Negative.   Neurological: Negative.   Endo/Heme/Allergies: Negative.   Psychiatric/Behavioral: Negative.   All other systems reviewed and are negative. 14 point ROS was done and is  otherwise as detailed above or in HPI  PHYSICAL EXAMINATION: ECOG PERFORMANCE STATUS: 0 - Asymptomatic  Vitals:   08/22/16 1031  BP: (!) 127/55  Pulse: 90  Resp: 18  Temp: 97.6 F (36.4 C)   Filed Weights   08/22/16 1031  Weight: 250 lb 1.6 oz (113.4 kg)   Physical Exam  Constitutional: She is oriented to person, place, and time and well-developed, well-nourished, and in no distress.  HENT:  Head: Normocephalic and atraumatic.  Nose: Nose normal.  Mouth/Throat: Oropharynx is clear and moist. No oropharyngeal exudate.  Eyes: Conjunctivae and EOM are normal. Pupils are equal, round, and reactive to light. Right eye exhibits no discharge. Left eye exhibits no discharge. No scleral icterus.  Neck: Normal range of motion. Neck supple. No tracheal deviation present. No thyromegaly present.  Cardiovascular: Normal rate, regular  rhythm and normal heart sounds.  Exam reveals no gallop and no friction rub.   No murmur heard. Pulmonary/Chest: Effort normal and breath sounds normal. She has no wheezes. She has no rales.  Abdominal: Soft. Bowel sounds are normal. She exhibits no distension and no mass. There is no tenderness. There is no rebound and no guarding.  Musculoskeletal: Normal range of motion. She exhibits no edema.  Lymphadenopathy:    She has no cervical adenopathy.  Neurological: She is alert and oriented to person, place, and time. She has normal reflexes. No cranial nerve deficit. Gait normal. Coordination normal.  Skin: Skin is warm and dry. No rash noted.  Psychiatric: Mood, memory, affect and judgment normal.  Nursing note and vitals reviewed.  LABORATORY DATA:  I have reviewed the data as listed CBC Latest Ref Rng & Units 08/22/2016 02/22/2016 10/18/2015  WBC 4.0 - 10.5 K/uL 9.7 8.6 7.4  Hemoglobin 12.0 - 15.0 g/dL 11.0(L) 10.8(L) 10.6(L)  Hematocrit 36.0 - 46.0 % 35.8(L) 35.0(L) 34.4(L)  Platelets 150 - 400 K/uL 300 361 313   CMP Latest Ref Rng & Units 08/22/2016  08/02/2016 03/28/2016  Glucose 65 - 99 mg/dL 212(H) 145(H) 153(H)  BUN 6 - 20 mg/dL 13 10 17   Creatinine 0.44 - 1.00 mg/dL 1.04(H) 0.87 0.97  Sodium 135 - 145 mmol/L 136 140 138  Potassium 3.5 - 5.1 mmol/L 3.7 4.5 4.4  Chloride 101 - 111 mmol/L 101 102 105  CO2 22 - 32 mmol/L 24 30 23   Calcium 8.9 - 10.3 mg/dL 8.9 9.3 8.9  Total Protein 6.5 - 8.1 g/dL 7.2 - 6.9  Total Bilirubin 0.3 - 1.2 mg/dL 0.4 - 0.3  Alkaline Phos 38 - 126 U/L 115 - 128  AST 15 - 41 U/L 23 - 15  ALT 14 - 54 U/L 20 - 14  Results for GHAZAL, PEVEY (MRN [id number]) as of 08/22/2016 15:11  Ref. Range 08/22/2016 09:56  Iron Latest Ref Range: 28 - 170 ug/dL 47  UIBC Latest Units: ug/dL 268  TIBC Latest Ref Range: 250 - 450 ug/dL 315  Saturation Ratios Latest Ref Range: 10.4 - 31.8 % 15  Ferritin Latest Ref Range: 11 - 307 ng/mL 115  Vitamin B12 Latest Ref Range: 180 - 914 pg/mL 611   PATHOLOGY   RADIOGRAPHIC STUDIES: I have personally reviewed the radiological images as listed and agreed with the findings in the report.  DEXA Scan 02/14/2016 BMD as determined from [facility] L1-L3 is 1.072 g/cm2 with a T-Score of -0.8. This patient is considered normal according to [facility]) criteria. (L-4 was excluded due to advanced degenerative changes.) (Patient is not a candidate for FRAX assessment due to normal bone density exam.)  World Health Organization [facility]) criteria for post-menopausal, Caucasian Women: Normal:       T-score at or above -1 SD Osteopenia:   T-score between -1 and -2.5 SD Osteoporosis: T-score at or below -2.5 SD  ASSESSMENT & PLAN:  Microcytic Anemia longstanding dating back to 2010 Serum ferritin 38 ng/ml Colonoscopy 05/15/2015 with redundant L colon, diverticulosis, small internal hemorrhoids EGD 05/14/2014 with gastric polyps, GE junction stricture Givens Capsule 06/02/2015 OCCASIONAL EROSION IN DUODENUM No masses, ULCERS, or AVMs SEEN. OCCASIONAL LYMPHANGECTASIA B12  deficiency   Labs reviewed. Hgb 11.0 today. Iron studies and B12 levels are good.  Continue oral B12 supplements.   Continue surveillance of labs.  She will return for follow up in 6 months with labs.   Orders Placed This  Encounter  Procedures  . CBC with Differential    Standing Status:   Future    Standing Expiration Date:   04/23/2017  . Comprehensive metabolic panel    Standing Status:   Future    Standing Expiration Date:   04/23/2017  . Iron and TIBC    Standing Status:   Future    Standing Expiration Date:   04/23/2017  . Ferritin    Standing Status:   Future    Standing Expiration Date:   04/23/2017  . Vitamin B12    Standing Status:   Future    Standing Expiration Date:   04/23/2017    All questions were answered. The patient knows to call the clinic with any problems, questions or concerns.  This document serves as a record of services personally performed by Twana First, MD. It was created on her behalf by Martinique Casey, a trained medical scribe. The creation of this record is based on the scribe's personal observations and the provider's statements to them. This document has been checked and approved by the attending provider.  I have reviewed the above documentation for accuracy and completeness, and I agree with the above.  This note was electronically signed.   Martinique M Casey  08/22/2016 11:00 AM

## 2016-09-24 ENCOUNTER — Other Ambulatory Visit: Payer: Self-pay | Admitting: Family Medicine

## 2016-09-24 ENCOUNTER — Encounter (HOSPITAL_COMMUNITY): Payer: Medicare HMO | Attending: Adult Health

## 2016-09-24 VITALS — BP 145/83 | HR 101 | Temp 98.1°F | Resp 18

## 2016-09-24 DIAGNOSIS — E538 Deficiency of other specified B group vitamins: Secondary | ICD-10-CM

## 2016-09-24 MED ORDER — CYANOCOBALAMIN 1000 MCG/ML IJ SOLN
1000.0000 ug | Freq: Once | INTRAMUSCULAR | Status: AC
  Administered 2016-09-24: 1000 ug via INTRAMUSCULAR
  Filled 2016-09-24: qty 1

## 2016-09-24 NOTE — Patient Instructions (Signed)
[facility] at [facility] Discharge Instructions  RECOMMENDATIONS MADE BY THE CONSULTANT AND ANY TEST RESULTS WILL BE SENT TO YOUR REFERRING PHYSICIAN.  Vitamin B12 1000 mcg injection given as ordered. Return as scheduled.  Thank you for choosing [facility] at [facility] to provide your oncology and hematology care.  To afford each patient quality time with our provider, please arrive at least 15 minutes before your scheduled appointment time.    If you have a lab appointment with the Cancer Center please come in thru the  Main Entrance and check in at the main information desk  You need to re-schedule your appointment should you arrive 10 or more minutes late.  We strive to give you quality time with our providers, and arriving late affects you and other patients whose appointments are after yours.  Also, if you no show three or more times for appointments you may be dismissed from the clinic at the providers discretion.     Again, thank you for choosing [facility].  Our hope is that these requests will decrease the amount of time that you wait before being seen by our physicians.       _____________________________________________________________  Should you have questions after your visit to [facility], please contact our office at [phone] between the hours of 8:30 a.m. and 4:30 p.m.  Voicemails left after 4:30 p.m. will not be returned until the following business day.  For prescription refill requests, have your pharmacy contact our office.       Resources For Cancer Patients and their Caregivers ? American Cancer Society: Can assist with transportation, wigs, general needs, runs Look Good Feel Better.        1-888-227-6333 ? Cancer Care: Provides financial assistance, online support groups, medication/co-pay assistance.  1-800-813-HOPE (4673) ? [facility] Assists [place] cancer patients and their families through emotional , educational and financial support.  336-427-4357 ? [place] DSS Where to apply for food stamps, Medicaid and utility assistance. 336-342-1394 ? RCATS: Transportation to medical appointments. 336-347-2287 ? Social Security Administration: May apply for disability if have a Stage IV cancer. 336-342-7796 1-800-772-1213 ? [place] Aging, Disability and Transit Services: Assists with nutrition, care and transit needs. 336-349-2343  Cancer Center Support Programs: @10RELATIVEDAYS@ > Cancer Support Group  2nd Tuesday of the month 1pm-2pm, Journey Room  > Creative Journey  3rd Tuesday of the month 1130am-1pm, Journey Room  > Look Good Feel Better  1st Wednesday of the month 10am-12 noon, Journey Room (Call [employer] to register 1-800-395-5775)   

## 2016-09-24 NOTE — Progress Notes (Signed)
Helen Mahoney presents today for injection per MD orders. B12 1000 mcg administered IM in right Upper Arm. Administration without incident. Patient tolerated well. Stable and ambulatory on discharge home to self.

## 2016-10-25 ENCOUNTER — Encounter (HOSPITAL_COMMUNITY): Payer: Medicare HMO | Attending: Oncology

## 2016-10-25 VITALS — BP 135/89 | HR 96 | Temp 98.1°F | Resp 20

## 2016-10-25 DIAGNOSIS — D508 Other iron deficiency anemias: Secondary | ICD-10-CM | POA: Insufficient documentation

## 2016-10-25 DIAGNOSIS — E538 Deficiency of other specified B group vitamins: Secondary | ICD-10-CM | POA: Insufficient documentation

## 2016-10-25 MED ORDER — CYANOCOBALAMIN 1000 MCG/ML IJ SOLN
1000.0000 ug | Freq: Once | INTRAMUSCULAR | Status: AC
  Administered 2016-10-25: 1000 ug via INTRAMUSCULAR

## 2016-10-25 MED ORDER — CYANOCOBALAMIN 1000 MCG/ML IJ SOLN
INTRAMUSCULAR | Status: AC
  Filled 2016-10-25: qty 1

## 2016-10-25 NOTE — Progress Notes (Signed)
Helen Mahoney presents today for injection per MD orders. B12 1000 mcg administered IM in right Upper Arm. Administration without incident. Patient tolerated well. Stable and ambulatory on discharge home wioh sister.

## 2016-10-25 NOTE — Patient Instructions (Signed)
[facility] at [facility] Discharge Instructions  RECOMMENDATIONS MADE BY THE CONSULTANT AND ANY TEST RESULTS WILL BE SENT TO YOUR REFERRING PHYSICIAN.  Vitamin B12 1000 mcg injection given as ordered. Return as scheduled.  Thank you for choosing [facility] at [facility] to provide your oncology and hematology care.  To afford each patient quality time with our provider, please arrive at least 15 minutes before your scheduled appointment time.    If you have a lab appointment with the Cancer Center please come in thru the  Main Entrance and check in at the main information desk  You need to re-schedule your appointment should you arrive 10 or more minutes late.  We strive to give you quality time with our providers, and arriving late affects you and other patients whose appointments are after yours.  Also, if you no show three or more times for appointments you may be dismissed from the clinic at the providers discretion.     Again, thank you for choosing [facility].  Our hope is that these requests will decrease the amount of time that you wait before being seen by our physicians.       _____________________________________________________________  Should you have questions after your visit to  Cancer Center, please contact our office at [phone] between the hours of 8:30 a.m. and 4:30 p.m.  Voicemails left after 4:30 p.m. will not be returned until the following business day.  For prescription refill requests, have your pharmacy contact our office.       Resources For Cancer Patients and their Caregivers ? American Cancer Society: Can assist with transportation, wigs, general needs, runs Look Good Feel Better.        1-888-227-6333 ? Cancer Care: Provides financial assistance, online support groups, medication/co-pay assistance.  1-800-813-HOPE (4673) ? [facility] Assists [place] cancer patients and their families through emotional , educational and financial support.  336-427-4357 ? [place] DSS Where to apply for food stamps, Medicaid and utility assistance. 336-342-1394 ? [facility]: Transportation to medical appointments. 336-347-2287 ? Social Security Administration: May apply for disability if have a Stage IV cancer. 336-342-7796 1-800-772-1213 ? [place] Aging, Disability and Transit Services: Assists with nutrition, care and transit needs. 336-349-2343  Cancer Center Support Programs: [email or web address]@ > Cancer Support Group  2nd Tuesday of the month 1pm-2pm, Journey Room  > Creative Journey  3rd Tuesday of the month 1130am-1pm, Journey Room  > Look Good Feel Better  1st Wednesday of the month 10am-12 noon, Journey Room (Call American Cancer Society to register 1-800-395-5775)   

## 2016-11-04 ENCOUNTER — Ambulatory Visit (INDEPENDENT_AMBULATORY_CARE_PROVIDER_SITE_OTHER): Payer: Medicare HMO

## 2016-11-04 VITALS — BP 134/86 | HR 84 | Temp 97.6°F | Ht 66.0 in | Wt 251.1 lb

## 2016-11-04 DIAGNOSIS — E119 Type 2 diabetes mellitus without complications: Secondary | ICD-10-CM | POA: Diagnosis not present

## 2016-11-04 DIAGNOSIS — I10 Essential (primary) hypertension: Secondary | ICD-10-CM | POA: Diagnosis not present

## 2016-11-04 DIAGNOSIS — E559 Vitamin D deficiency, unspecified: Secondary | ICD-10-CM | POA: Diagnosis not present

## 2016-11-04 DIAGNOSIS — E785 Hyperlipidemia, unspecified: Secondary | ICD-10-CM | POA: Diagnosis not present

## 2016-11-04 DIAGNOSIS — Z0000 Encounter for general adult medical examination without abnormal findings: Secondary | ICD-10-CM | POA: Diagnosis not present

## 2016-11-04 DIAGNOSIS — Z794 Long term (current) use of insulin: Secondary | ICD-10-CM | POA: Diagnosis not present

## 2016-11-04 LAB — LIPID PANEL
Cholesterol: 186 mg/dL (ref <200)
HDL: 48 mg/dL — ABNORMAL LOW (ref >50)
LDL CALC: 109 mg/dL — AB (ref <100)
Total CHOL/HDL Ratio: 3.9 Ratio (ref <5.0)
Triglycerides: 145 mg/dL (ref <150)
VLDL: 29 mg/dL (ref <30)

## 2016-11-04 LAB — COMPLETE METABOLIC PANEL WITH GFR
ALT: 19 U/L (ref 6–29)
AST: 18 U/L (ref 10–35)
Albumin: 3.8 g/dL (ref 3.6–5.1)
Alkaline Phosphatase: 121 U/L (ref 33–130)
BILIRUBIN TOTAL: 0.4 mg/dL (ref 0.2–1.2)
BUN: 12 mg/dL (ref 7–25)
CHLORIDE: 100 mmol/L (ref 98–110)
CO2: 26 mmol/L (ref 20–31)
CREATININE: 0.94 mg/dL (ref 0.50–0.99)
Calcium: 9.4 mg/dL (ref 8.6–10.4)
GFR, EST AFRICAN AMERICAN: 73 mL/min (ref >=60)
GFR, Est Non African American: 63 mL/min (ref >=60)
GLUCOSE: 149 mg/dL — AB (ref 65–99)
Potassium: 4.5 mmol/L (ref 3.5–5.3)
Sodium: 137 mmol/L (ref 135–146)
TOTAL PROTEIN: 7 g/dL (ref 6.1–8.1)

## 2016-11-04 LAB — TSH: TSH: 3.14 mIU/L

## 2016-11-04 NOTE — Progress Notes (Signed)
Subjective:   Helen Mahoney is a 66 y.o. female who presents for an Initial Medicare Annual Wellness Visit.  Review of Systems:  Cardiac Risk Factors include: advanced age (>64men, >94 women);diabetes mellitus;hypertension;dyslipidemia;obesity (BMI >30kg/m2);sedentary lifestyle     Objective:    Today's Vitals   11/04/16 1005  BP: 134/86  Pulse: 84  Temp: 97.6 F (36.4 C)  TempSrc: Oral  Weight: 251 lb 1.3 oz (113.9 kg)  Height: 5\' 6"  (1.676 m)   Body mass index is 40.53 kg/m.   Current Medications (verified) Outpatient Encounter Prescriptions as of 11/04/2016  Medication Sig  . aspirin 81 MG tablet Take 81 mg by mouth daily.  . Calcium Citrate-Vitamin D (CITRACAL PETITES/VITAMIN D) 200-250 MG-UNIT TABS Take 1 tablet by mouth daily.   . Cholecalciferol (VITAMIN D3) 50000 units CAPS TAKE 1 CAPSULE ONE TIME WEEKLY  . Ferrous Sulfate (IRON) 325 (65 FE) MG TABS Take 1 tablet by mouth daily.  Marland Kitchen glipiZIDE (GLUCOTROL) 10 MG tablet TAKE 1 TABLET TWICE DAILY BEFORE MEALS  . Insulin Glargine (LANTUS SOLOSTAR) 100 UNIT/ML Solostar Pen Inject 40 Units into the skin daily at 10 pm.  . Insulin Pen Needle (RELION SHORT PEN NEEDLES) 31G X 8 MM MISC USE ONE ONCE DAILY  . metFORMIN (GLUCOPHAGE) 1000 MG tablet TAKE 1 TABLET TWICE DAILY WITH MEALS  . Multiple Vitamin (MULTIVITAMINS PO) Take 1 tablet by mouth daily.   . pravastatin (PRAVACHOL) 80 MG tablet TAKE 1 TABLET ONE TIME DAILY IN THE EVENING  . TRUE METRIX BLOOD GLUCOSE TEST test strip TEST THREE TIMES DAILY   No facility-administered encounter medications on file as of 11/04/2016.     Allergies (verified) Tetanus toxoids and Penicillins   History: Past Medical History:  Diagnosis Date  . 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 B12 deficiency 06/16/2015  . Colon polyps   . Diabetes mellitus, type 2 ([place])   . Hyperlipidemia   .  Hypertension   . Microcytic anemia 05/18/2015   2013: TCS/EGD 2017: TCS/EGD HYPERPLASTIC GASTRIC POLYPS, LYMPHOCYTIC GASTRITIS   . Obesity    Past Surgical History:  Procedure Laterality Date  . bilateral tubal ligation  1979  . CHOLECYSTECTOMY  2001   ACUTE CHOLECYSTITIS/GALLSTONES  . COLONOSCOPY  05/14/2011   Dr. Oneida Alar: sessile polyp in sigmoid colon, internal hemorrhoids, hyerplastic polyps  . COLONOSCOPY N/A 05/15/2015   Procedure: COLONOSCOPY;  Surgeon: Danie Binder, MD;  [facility];  Service: Endoscopy;  Laterality: N/A;  0830  . COLONOSCOPY W/ POLYPECTOMY  NOV 2008 [facility] ANEMIA   POLYP NO RETRIEVED  . COLONOSCOPY W/ POLYPECTOMY  2006 DR. SMTH   POLYP?  . ESOPHAGOGASTRODUODENOSCOPY  05/14/2011   Dr. Oneida Alar: sessile polyps in the cardia, mild gastritis. Chronic duodenitis consistent with peptic duodenitis, chronic active H.pylori gastritis.   Marland Kitchen ESOPHAGOGASTRODUODENOSCOPY N/A 05/15/2015   Procedure: ESOPHAGOGASTRODUODENOSCOPY (EGD);  Surgeon: Danie Binder, MD;  [facility];  Service: Endoscopy;  Laterality: N/A;  . GIVENS CAPSULE STUDY N/A 06/02/2015   Procedure: GIVENS CAPSULE STUDY;  Surgeon: Danie Binder, MD;  [facility]: [facility];  Service: Endoscopy;  Laterality: N/A;  0700  . TUBAL LIGATION    . UPPER GASTROINTESTINAL ENDOSCOPY  NOV 2008 [facility] ANEMIA   NL EXAM, urease neg   Family History  Problem Relation Age of Onset  . Heart disease Mother   . Diabetes Mother   . Hypertension Mother   .  Stroke Mother   . Breast cancer Mother   . Diabetes Father   . Heart attack Father   . Kidney disease Father   . Hypertension Sister   . Diabetes Sister   . Diabetes Sister   . Mental illness Sister   . Hypertension Sister   . Hepatitis C Sister   . Mental illness Sister   . Diabetes Sister   . Hypertension Sister   . Mental illness Sister   . Colon cancer Sister 32  . Diabetes Brother    Social History   Occupational History  . Not on file.    Social History Main Topics  . Smoking status: Never Smoker  . Smokeless tobacco: Never Used  . Alcohol use No  . Drug use: No  . Sexual activity: Not Currently    Tobacco Counseling Counseling given: Not Answered   Activities of Daily Living In your present state of health, do you have any difficulty performing the following activities: 11/04/2016  Hearing? N  Vision? N  Difficulty concentrating or making decisions? N  Walking or climbing stairs? N  Dressing or bathing? N  Doing errands, shopping? N  Preparing Food and eating ? N  Using the Toilet? N  In the past six months, have you accidently leaked urine? N  Do you have problems with loss of bowel control? N  Managing your Medications? N  Managing your Finances? N  Housekeeping or managing your Housekeeping? N  Some recent data might be hidden    Immunizations and Health Maintenance Immunization History  Administered Date(s) Administered  . Influenza Split 02/14/2011, 03/18/2012  . Influenza Whole 02/03/2007, 01/25/2008, 01/20/2009, 01/04/2010  . Influenza,inj,Quad PF,36+ Mos 02/18/2013, 03/14/2014, 01/12/2015, 02/22/2016  . Pneumococcal Conjugate-13 07/13/2014  . Pneumococcal Polysaccharide-23 09/14/2009, 09/11/2015  . Zoster 10/16/2010   There are no preventive care reminders to display for this patient.  Patient Care Team: Fayrene Helper, MD as PCP - General Danie Binder, MD as Consulting Physician (Gastroenterology) Whitney Muse Kelby Fam, MD as Consulting Physician (Hematology and Oncology)  Indicate any recent Medical Services you may have received from other than [facility] providers in the past year (date may be approximate).     Assessment:   This is a routine wellness examination for [place].  Hearing/Vision screen  Visual Acuity Screening   Right eye Left eye Both eyes  Without correction:     With correction: 20/20 20/20 20/20     Dietary issues and exercise activities discussed: Current  Exercise Habits: The patient does not participate in regular exercise at present, Exercise limited by: None identified  Goals    . Exercise 3x per week (30 min per time)          Recommend starting a routine exercise program at least 3 days a week for 30-45 minutes at a time as tolerated.        Depression Screen PHQ 2/9 Scores 11/04/2016 10/30/2015 07/13/2014 04/06/2013  PHQ - 2 Score 0 0 0 0  PHQ- 9 Score - - 1 -    Fall Risk Fall Risk  11/04/2016 10/30/2015 04/04/2015 03/18/2012  Falls in the past year? No No No No    Cognitive Function: Normal   6CIT Screen 11/04/2016  What Year? 0 points  What month? 0 points  What time? 0 points  Count back from 20 0 points  Months in reverse 0 points  Repeat phrase 0 points  Total Score 0    Screening Tests Health Maintenance  Topic  Date Due  . TETANUS/TDAP  03/18/2021 (Originally 06/02/1969)  . URINE MICROALBUMIN  11/26/2016  . INFLUENZA VACCINE  11/27/2016  . OPHTHALMOLOGY EXAM  12/05/2016  . HEMOGLOBIN A1C  02/01/2017  . FOOT EXAM  04/11/2017  . MAMMOGRAM  02/13/2018  . COLONOSCOPY  05/14/2020  . DEXA SCAN  Completed  . Hepatitis C Screening  Completed  . PNA vac Low Risk Adult  Completed      Plan:   I have personally reviewed and noted the following in the patient's chart:   . Medical and social history . Use of alcohol, tobacco or illicit drugs  . Current medications and supplements . Functional ability and status . Nutritional status . Physical activity . Advanced directives . List of other physicians . Hospitalizations, surgeries, and ER visits in previous 12 months . Vitals . Screenings to include cognitive, depression, and falls . Referrals and appointments  In addition, I have reviewed and discussed with patient certain preventive protocols, quality metrics, and best practice recommendations. A written personalized care plan for preventive services as well as general preventive health recommendations were provided  to patient.     Stormy Fabian, LPN   09/01/2128  Lead Nurse Health Advisor

## 2016-11-04 NOTE — Patient Instructions (Addendum)
Helen Mahoney , Thank you for taking time to come for your Medicare Wellness Visit. I appreciate your ongoing commitment to your health goals. Please review the following plan we discussed and let me know if I can assist you in the future.   Screening recommendations/referrals: Colonoscopy: Up to date, next due 04/2020 Mammogram: Up to date, next due 01/2017 Bone Density: Up to date Diabetic Eye Exam: Up to date, due 11/2016 please schedule as soon as you are able Recommended yearly dental visit for hygiene and checkup  Vaccinations: Influenza vaccine: Up to date, next due 12/2016 Pneumococcal vaccine: Up to date Tdap vaccine: Not required due to allergy Shingles vaccine: Completed    Advanced directives: Advance directive discussed with you today. I have provided a copy for you to complete at home and have notarized. Once this is complete please bring a copy in to our office so we can scan it into your chart.  Conditions/risks identified: Obese, recommend starting a routine exercise program at least 3 days a week for 30-45 minutes at a time as tolerated.   Next appointment: Follow up with Dr. Moshe Cipro on 11/19/2016 at 9:40 am. Please have your lab work completed about a week prior to your appointment with Dr. Moshe Cipro. Follow up in 1 year for your annual wellness visit.   Preventive Care 16 Years and Older, Female Preventive care refers to lifestyle choices and visits with your health care provider that can promote health and wellness. What does preventive care include?  A yearly physical exam. This is also called an annual well check.  Dental exams once or twice a year.  Routine eye exams. Ask your health care provider how often you should have your eyes checked.  Personal lifestyle choices, including:  Daily care of your teeth and gums.  Regular physical activity.  Eating a healthy diet.  Avoiding tobacco and drug use.  Limiting alcohol use.  Practicing safe sex.  Taking  low-dose aspirin every day.  Taking vitamin and mineral supplements as recommended by your health care provider. What happens during an annual well check? The services and screenings done by your health care provider during your annual well check will depend on your age, overall health, lifestyle risk factors, and family history of disease. Counseling  Your health care provider may ask you questions about your:  Alcohol use.  Tobacco use.  Drug use.  Emotional well-being.  Home and relationship well-being.  Sexual activity.  Eating habits.  History of falls.  Memory and ability to understand (cognition).  Work and work Statistician.  Reproductive health. Screening  You may have the following tests or measurements:  Height, weight, and BMI.  Blood pressure.  Lipid and cholesterol levels. These may be checked every 5 years, or more frequently if you are over 65 years old.  Skin check.  Lung cancer screening. You may have this screening every year starting at age 76 if you have a 30-pack-year history of smoking and currently smoke or have quit within the past 15 years.  Fecal occult blood test (FOBT) of the stool. You may have this test every year starting at age 28.  Flexible sigmoidoscopy or colonoscopy. You may have a sigmoidoscopy every 5 years or a colonoscopy every 10 years starting at age 57.  Hepatitis C blood test.  Hepatitis B blood test.  Sexually transmitted disease (STD) testing.  Diabetes screening. This is done by checking your blood sugar (glucose) after you have not eaten for a while (fasting). You  may have this done every 1-3 years.  Bone density scan. This is done to screen for osteoporosis. You may have this done starting at age 14.  Mammogram. This may be done every 1-2 years. Talk to your health care provider about how often you should have regular mammograms. Talk with your health care provider about your test results, treatment options, and  if necessary, the need for more tests. Vaccines  Your health care provider may recommend certain vaccines, such as:  Influenza vaccine. This is recommended every year.  Tetanus, diphtheria, and acellular pertussis (Tdap, Td) vaccine. You may need a Td booster every 10 years.  Zoster vaccine. You may need this after age 52.  Pneumococcal 13-valent conjugate (PCV13) vaccine. One dose is recommended after age 79.  Pneumococcal polysaccharide (PPSV23) vaccine. One dose is recommended after age 90. Talk to your health care provider about which screenings and vaccines you need and how often you need them. This information is not intended to replace advice given to you by your health care provider. Make sure you discuss any questions you have with your health care provider. Document Released: 05/12/2015 Document Revised: 01/03/2016 Document Reviewed: 02/14/2015 Elsevier Interactive Patient Education  2017 [place] Prevention in the Home Falls can cause injuries. They can happen to people of all ages. There are many things you can do to make your home safe and to help prevent falls. What can I do on the outside of my home?  Regularly fix the edges of walkways and driveways and fix any cracks.  Remove anything that might make you trip as you walk through a door, such as a raised step or threshold.  Trim any bushes or trees on the path to your home.  Use bright outdoor lighting.  Clear any walking paths of anything that might make someone trip, such as rocks or tools.  Regularly check to see if handrails are loose or broken. Make sure that both sides of any steps have handrails.  Any raised decks and porches should have guardrails on the edges.  Have any leaves, snow, or ice cleared regularly.  Use sand or salt on walking paths during winter.  Clean up any spills in your garage right away. This includes oil or grease spills. What can I do in the bathroom?  Use night  lights.  Install grab bars by the toilet and in the tub and shower. Do not use towel bars as grab bars.  Use non-skid mats or decals in the tub or shower.  If you need to sit down in the shower, use a plastic, non-slip stool.  Keep the floor dry. Clean up any water that spills on the floor as soon as it happens.  Remove soap buildup in the tub or shower regularly.  Attach bath mats securely with double-sided non-slip rug tape.  Do not have throw rugs and other things on the floor that can make you trip. What can I do in the bedroom?  Use night lights.  Make sure that you have a light by your bed that is easy to reach.  Do not use any sheets or blankets that are too big for your bed. They should not hang down onto the floor.  Have a firm chair that has side arms. You can use this for support while you get dressed.  Do not have throw rugs and other things on the floor that can make you trip. What can I do in the kitchen?  Clean  up any spills right away.  Avoid walking on wet floors.  Keep items that you use a lot in easy-to-reach places.  If you need to reach something above you, use a strong step stool that has a grab bar.  Keep electrical cords out of the way.  Do not use floor polish or wax that makes floors slippery. If you must use wax, use non-skid floor wax.  Do not have throw rugs and other things on the floor that can make you trip. What can I do with my stairs?  Do not leave any items on the stairs.  Make sure that there are handrails on both sides of the stairs and use them. Fix handrails that are broken or loose. Make sure that handrails are as long as the stairways.  Check any carpeting to make sure that it is firmly attached to the stairs. Fix any carpet that is loose or worn.  Avoid having throw rugs at the top or bottom of the stairs. If you do have throw rugs, attach them to the floor with carpet tape.  Make sure that you have a light switch at the  top of the stairs and the bottom of the stairs. If you do not have them, ask someone to add them for you. What else can I do to help prevent falls?  Wear shoes that:  Do not have high heels.  Have rubber bottoms.  Are comfortable and fit you well.  Are closed at the toe. Do not wear sandals.  If you use a stepladder:  Make sure that it is fully opened. Do not climb a closed stepladder.  Make sure that both sides of the stepladder are locked into place.  Ask someone to hold it for you, if possible.  Clearly mark and make sure that you can see:  Any grab bars or handrails.  First and last steps.  Where the edge of each step is.  Use tools that help you move around (mobility aids) if they are needed. These include:  Canes.  Walkers.  Scooters.  Crutches.  Turn on the lights when you go into a dark area. Replace any light bulbs as soon as they burn out.  Set up your furniture so you have a clear path. Avoid moving your furniture around.  If any of your floors are uneven, fix them.  If there are any pets around you, be aware of where they are.  Review your medicines with your doctor. Some medicines can make you feel dizzy. This can increase your chance of falling. Ask your doctor what other things that you can do to help prevent falls. This information is not intended to replace advice given to you by your health care provider. Make sure you discuss any questions you have with your health care provider. Document Released: 02/09/2009 Document Revised: 09/21/2015 Document Reviewed: 05/20/2014 Elsevier Interactive Patient Education  2017 [employer].

## 2016-11-05 LAB — HEMOGLOBIN A1C
Hgb A1c MFr Bld: 8.8 % — ABNORMAL HIGH (ref <5.7)
MEAN PLASMA GLUCOSE: 206 mg/dL

## 2016-11-05 LAB — VITAMIN D 25 HYDROXY (VIT D DEFICIENCY, FRACTURES): Vit D, 25-Hydroxy: 96 ng/mL (ref 30–100)

## 2016-11-19 ENCOUNTER — Ambulatory Visit (INDEPENDENT_AMBULATORY_CARE_PROVIDER_SITE_OTHER): Payer: Medicare HMO | Admitting: Family Medicine

## 2016-11-19 ENCOUNTER — Encounter: Payer: Self-pay | Admitting: Family Medicine

## 2016-11-19 VITALS — BP 130/80 | HR 108 | Temp 98.9°F | Resp 16 | Ht 65.0 in | Wt 249.5 lb

## 2016-11-19 DIAGNOSIS — E1169 Type 2 diabetes mellitus with other specified complication: Secondary | ICD-10-CM | POA: Diagnosis not present

## 2016-11-19 DIAGNOSIS — E785 Hyperlipidemia, unspecified: Secondary | ICD-10-CM | POA: Diagnosis not present

## 2016-11-19 DIAGNOSIS — I10 Essential (primary) hypertension: Secondary | ICD-10-CM | POA: Diagnosis not present

## 2016-11-19 DIAGNOSIS — Z1231 Encounter for screening mammogram for malignant neoplasm of breast: Secondary | ICD-10-CM

## 2016-11-19 DIAGNOSIS — E669 Obesity, unspecified: Secondary | ICD-10-CM | POA: Diagnosis not present

## 2016-11-19 DIAGNOSIS — Z1239 Encounter for other screening for malignant neoplasm of breast: Secondary | ICD-10-CM

## 2016-11-19 MED ORDER — INSULIN GLARGINE 100 UNIT/ML SOLOSTAR PEN: 50.0000 [IU] | PEN_INJECTOR | Freq: Every day | SUBCUTANEOUS | 99 refills | Status: DC

## 2016-11-19 NOTE — Patient Instructions (Addendum)
F/u in 3.5  month, call if you need m3e before  PLEASE start measuring carbs, stop sodas and sweets, test and record 3 times daily Inc lantus to 45 units for 2 weeks, if blood sugar still high, then increase to 50 units as new prescription states Fruit like watermelon and bananas, and pineapples need to be LIMITED  Blood sugar VERY uncontrolled , BUT YOU CAN CHANGE THIS  Bad cholesterol has increased, you need to eat MORE Vegetables  It is important that you exercise regularly at least 30 minutes 5 times a week. If you develop chest pain, have severe difficulty breathing, or feel very tired, stop exercising immediately and seek medical attention    I STRONGLY recvommend going to a diabetic class  CBC,HBA1C chem 7 and EGFR 3 days before next visit  Test 3 times daily and record  Goal for fasting blood sugar ranges from 80 to 120 and 2 hours after any meal or at bedtime should be between 130 to 170.   Thank you  for choosing [facility]. We consider it a privelige to serve you.  Delivering excellent health care in a caring and  compassionate way is our goal.  Partnering with you,  so that together we can achieve this goal is our strategy.

## 2016-11-19 NOTE — Assessment & Plan Note (Signed)
Deteriorated, increase lantus to 50 units Helen Mahoney is reminded of the importance of commitment to daily physical activity for 30 minutes or more, as able and the need to limit carbohydrate intake to 30 to 60 grams per meal to help with blood sugar control.   The need to take medication as prescribed, test blood sugar as directed, and to call between visits if there is a concern that blood sugar is uncontrolled is also discussed.   Helen Mahoney is reminded of the importance of daily foot exam, annual eye examination, and good blood sugar, blood pressure and cholesterol control.  Diabetic Labs Latest Ref Rng & Units 11/04/2016 08/22/2016 08/02/2016 03/28/2016 11/27/2015  HbA1c <5.7 % 8.8(H) - 8.2(H) 7.6(H) -  Microalbumin Not Estab. ug/mL - - - - 31.5(H)  Micro/Creat Ratio 0.0 - 30.0 mg/g creat - - - - 24.6  Chol <200 mg/dL 186 - - 175 -  HDL >50 mg/dL 48(L) - - 50(L) -  Calc LDL <100 mg/dL 109(H) - - 108(H) -  Triglycerides <150 mg/dL 145 - - 84 -  Creatinine 0.50 - 0.99 mg/dL 0.94 1.04(H) 0.87 0.97 -   BP/Weight 11/19/2016 11/04/2016 10/25/2016 09/24/2016 08/22/2016 08/20/2016 5/59/7416  Systolic BP 384 536 468 032 122 482 500  Diastolic BP 86 86 89 83 55 82 73  Wt. (Lbs) 249.5 251.08 - - 250.1 249 -  BMI 41.52 40.53 - - 40.37 40.19 -   Foot/eye exam completion dates Latest Ref Rng & Units 04/11/2016 12/06/2015  Eye Exam No Retinopathy - No Retinopathy  Foot exam Order - - -  Foot Form Completion - Done -

## 2016-11-25 ENCOUNTER — Encounter (HOSPITAL_COMMUNITY): Payer: Self-pay

## 2016-11-25 ENCOUNTER — Encounter (HOSPITAL_COMMUNITY): Payer: Medicare HMO | Attending: Oncology

## 2016-11-25 VITALS — BP 139/77 | HR 107 | Temp 98.4°F | Resp 18

## 2016-11-25 DIAGNOSIS — E538 Deficiency of other specified B group vitamins: Secondary | ICD-10-CM | POA: Diagnosis not present

## 2016-11-25 DIAGNOSIS — D508 Other iron deficiency anemias: Secondary | ICD-10-CM | POA: Insufficient documentation

## 2016-11-25 MED ORDER — CYANOCOBALAMIN 1000 MCG/ML IJ SOLN
1000.0000 ug | Freq: Once | INTRAMUSCULAR | Status: AC
  Administered 2016-11-25: 1000 ug via INTRAMUSCULAR
  Filled 2016-11-25: qty 1

## 2016-11-25 NOTE — Progress Notes (Signed)
Helen Mahoney presents today for injection per the provider's orders.  B12 administration without incident; see MAR for injection details.  Patient tolerated procedure well and without incident.  No questions or complaints noted at this time.  Discharged ambulatory.  

## 2016-11-26 ENCOUNTER — Other Ambulatory Visit: Payer: Self-pay | Admitting: Family Medicine

## 2016-11-27 ENCOUNTER — Other Ambulatory Visit: Payer: Self-pay | Admitting: Family Medicine

## 2016-11-30 ENCOUNTER — Encounter: Payer: Self-pay | Admitting: Family Medicine

## 2016-11-30 NOTE — Assessment & Plan Note (Addendum)
UnControlled, no change in medication, has improved, needs to lower fat intake Hyperlipidemia:Low fat diet discussed and encouraged. Needs to commit to regular exercise   Lipid Panel  Lab Results  Component Value Date   CHOL 186 11/04/2016   HDL 48 (L) 11/04/2016   LDLCALC 109 (H) 11/04/2016   TRIG 145 11/04/2016   CHOLHDL 3.9 11/04/2016

## 2016-11-30 NOTE — Progress Notes (Signed)
Helen Mahoney     MRN: [id number]      DOB: 1950/12/05   HPI Helen Mahoney is here for follow up and re-evaluation of chronic medical conditions, medication management and review of any available recent lab and radiology data.  Preventive health is updated, specifically  Cancer screening and Immunization.   Questions or concerns regarding consultations or procedures which the PT has had in the interim are  addressed. The PT denies any adverse reactions to current medications since the last visit.  Not testing blood sugar regularly and when she does , fasting blood sugar often over 200. Continues to ear whatever she wants and no exercise commitment Denies polyuria, polydipsia, blurred vision , or hypoglycemic episodes.    ROS Denies recent fever or chills. Denies sinus pressure, nasal congestion, ear pain or sore throat. Denies chest congestion, productive cough or wheezing. Denies chest pains, palpitations and leg swelling Denies abdominal pain, nausea, vomiting,diarrhea or constipation.   Denies dysuria, frequency, hesitancy or incontinence. Denies joint pain, swelling and limitation in mobility. Denies headaches, seizures, numbness, or tingling. Denies depression, anxiety or insomnia. Denies skin break down or rash.   PE  BP 130/80 (BP Location: Left Arm, Patient Position: Sitting, Cuff Size: Normal)   Pulse (!) 108   Temp 98.9 F (37.2 C) (Other (Comment))   Resp 16   Ht 5\' 5"  (1.651 m)   Wt 249 lb 8 oz (113.2 kg)   SpO2 99%   BMI 41.52 kg/m   Patient alert and oriented and in no cardiopulmonary distress.  HEENT: No facial asymmetry, EOMI,   oropharynx pink and moist.  Neck supple no JVD, no mass.  Chest: Clear to auscultation bilaterally.  CVS: S1, S2 no murmurs, no S3.Regular rate.  ABD: Soft non tender.   Ext: No edema  MS: Adequate ROM spine, shoulders, hips and knees.  Skin: Intact, no ulcerations or rash noted.  Psych: Good eye contact, normal  affect. Memory intact not anxious or depressed appearing.  CNS: CN 2-12 intact, power,  normal throughout.no focal deficits noted.   Assessment & Plan  Diabetes mellitus, insulin dependent (IDDM), uncontrolled (HCC) Deteriorated, increase lantus to 50 units Helen Mahoney is reminded of the importance of commitment to daily physical activity for 30 minutes or more, as able and the need to limit carbohydrate intake to 30 to 60 grams per meal to help with blood sugar control.   The need to take medication as prescribed, test blood sugar as directed, and to call between visits if there is a concern that blood sugar is uncontrolled is also discussed.   Helen Mahoney is reminded of the importance of daily foot exam, annual eye examination, and good blood sugar, blood pressure and cholesterol control.  Diabetic Labs Latest Ref Rng & Units 11/04/2016 08/22/2016 08/02/2016 03/28/2016 11/27/2015  HbA1c <5.7 % 8.8(H) - 8.2(H) 7.6(H) -  Microalbumin Not Estab. ug/mL - - - - 31.5(H)  Micro/Creat Ratio 0.0 - 30.0 mg/g creat - - - - 24.6  Chol <200 mg/dL 186 - - 175 -  HDL >50 mg/dL 48(L) - - 50(L) -  Calc LDL <100 mg/dL 109(H) - - 108(H) -  Triglycerides <150 mg/dL 145 - - 84 -  Creatinine 0.50 - 0.99 mg/dL 0.94 1.04(H) 0.87 0.97 -   BP/Weight 11/19/2016 11/04/2016 10/25/2016 09/24/2016 08/22/2016 08/20/2016 06/18/2540  Systolic BP 706 237 628 315 176 160 737  Diastolic BP 86 86 89 83 55 82 73  Wt. (Lbs) 249.5 251.08 - -  250.1 249 -  BMI 41.52 40.53 - - 40.37 40.19 -   Foot/eye exam completion dates Latest Ref Rng & Units 04/11/2016 12/06/2015  Eye Exam No Retinopathy - No Retinopathy  Foot exam Order - - -  Foot Form Completion - Done -        Essential hypertension Controlled, no change in medication DASH diet and commitment to daily physical activity for a minimum of 30 minutes discussed and encouraged, as a part of hypertension management. The importance of attaining a healthy weight is also  discussed.  BP/Weight 11/25/2016 11/19/2016 11/04/2016 10/25/2016 09/24/2016 08/22/2016 6/75/9163  Systolic BP 846 659 935 701 779 390 300  Diastolic BP 77 80 86 89 83 55 82  Wt. (Lbs) - 249.5 251.08 - - 250.1 249  BMI - 41.52 40.53 - - 40.37 40.19       Morbid obesity Unchanged Patient re-educated about  the importance of commitment to a  minimum of 150 minutes of exercise per week.  The importance of healthy food choices with portion control discussed. Encouraged to start a food diary, count calories and to consider  joining a support group. Sample diet sheets offered. Goals set by the patient for the next several months.   Weight /BMI 11/19/2016 11/04/2016 08/22/2016  WEIGHT 249 lb 8 oz 251 lb 1.3 oz 250 lb 1.6 oz  HEIGHT 5\' 5"  5\' 6"  -  BMI 41.52 kg/m2 40.53 kg/m2 40.37 kg/m2      Hyperlipidemia LDL goal <100 UnControlled, no change in medication, has improved, needs to lower fat intake Hyperlipidemia:Low fat diet discussed and encouraged. Needs to commit to regular exercise   Lipid Panel  Lab Results  Component Value Date   CHOL 186 11/04/2016   HDL 48 (L) 11/04/2016   LDLCALC 109 (H) 11/04/2016   TRIG 145 11/04/2016   CHOLHDL 3.9 11/04/2016

## 2016-11-30 NOTE — Assessment & Plan Note (Signed)
Controlled, no change in medication DASH diet and commitment to daily physical activity for a minimum of 30 minutes discussed and encouraged, as a part of hypertension management. The importance of attaining a healthy weight is also discussed.  BP/Weight 11/25/2016 11/19/2016 11/04/2016 10/25/2016 09/24/2016 08/22/2016 3/35/8251  Systolic BP 898 421 031 281 188 677 373  Diastolic BP 77 80 86 89 83 55 82  Wt. (Lbs) - 249.5 251.08 - - 250.1 249  BMI - 41.52 40.53 - - 40.37 40.19

## 2016-11-30 NOTE — Assessment & Plan Note (Signed)
Unchanged Patient re-educated about  the importance of commitment to a  minimum of 150 minutes of exercise per week.  The importance of healthy food choices with portion control discussed. Encouraged to start a food diary, count calories and to consider  joining a support group. Sample diet sheets offered. Goals set by the patient for the next several months.   Weight /BMI 11/19/2016 11/04/2016 08/22/2016  WEIGHT 249 lb 8 oz 251 lb 1.3 oz 250 lb 1.6 oz  HEIGHT 5\' 5"  5\' 6"  -  BMI 41.52 kg/m2 40.53 kg/m2 40.37 kg/m2

## 2016-12-11 ENCOUNTER — Encounter: Payer: Self-pay | Admitting: Family Medicine

## 2016-12-11 DIAGNOSIS — H2513 Age-related nuclear cataract, bilateral: Secondary | ICD-10-CM | POA: Diagnosis not present

## 2016-12-11 DIAGNOSIS — E119 Type 2 diabetes mellitus without complications: Secondary | ICD-10-CM | POA: Diagnosis not present

## 2016-12-11 DIAGNOSIS — Z0100 Encounter for examination of eyes and vision without abnormal findings: Secondary | ICD-10-CM | POA: Diagnosis not present

## 2016-12-11 DIAGNOSIS — H524 Presbyopia: Secondary | ICD-10-CM | POA: Diagnosis not present

## 2016-12-11 DIAGNOSIS — H5213 Myopia, bilateral: Secondary | ICD-10-CM | POA: Diagnosis not present

## 2016-12-11 DIAGNOSIS — H52203 Unspecified astigmatism, bilateral: Secondary | ICD-10-CM | POA: Diagnosis not present

## 2016-12-11 LAB — HM DIABETES EYE EXAM

## 2016-12-26 ENCOUNTER — Encounter (HOSPITAL_COMMUNITY): Payer: Medicare HMO | Attending: Adult Health

## 2016-12-26 ENCOUNTER — Encounter (HOSPITAL_COMMUNITY): Payer: Self-pay

## 2016-12-26 VITALS — BP 154/73 | HR 94 | Temp 98.4°F | Resp 18

## 2016-12-26 DIAGNOSIS — E538 Deficiency of other specified B group vitamins: Secondary | ICD-10-CM | POA: Diagnosis not present

## 2016-12-26 MED ORDER — CYANOCOBALAMIN 1000 MCG/ML IJ SOLN
1000.0000 ug | Freq: Once | INTRAMUSCULAR | Status: AC
  Administered 2016-12-26: 1000 ug via INTRAMUSCULAR

## 2016-12-26 MED ORDER — CYANOCOBALAMIN 1000 MCG/ML IJ SOLN
INTRAMUSCULAR | Status: AC
  Filled 2016-12-26: qty 1

## 2016-12-26 NOTE — Patient Instructions (Signed)
[place] at [facility]  Discharge Instructions:  B12 shot today.  Keep scheduled appointments and call for any concerns or questions.  _______________________________________________________________  Thank you for choosing [place] at [facility] to provide your oncology and hematology care.  To afford each patient quality time with our providers, please arrive at least 15 minutes before your scheduled appointment.  You need to re-schedule your appointment if you arrive 10 or more minutes late.  We strive to give you quality time with our providers, and arriving late affects you and other patients whose appointments are after yours.  Also, if you no show three or more times for appointments you may be dismissed from the clinic.  Again, thank you for choosing [place] at [place] hope is that these requests will allow you access to exceptional care and in a timely manner. _______________________________________________________________  If you have questions after your visit, please contact our office at [phone] between the hours of 8:30 a.m. and 5:00 p.m. Voicemails left after 4:30 p.m. will not be returned until the following business day. _______________________________________________________________  For prescription refill requests, have your pharmacy contact our office. _______________________________________________________________  Recommendations made by the consultant and any test results will be sent to your referring physician. _______________________________________________________________

## 2016-12-26 NOTE — Progress Notes (Signed)
Patient tolerated Vitamin b12 shot with no complaints voiced.  Site clean and dry with band aid applied.  No bruising or swelling noted.  VSS with discharge.  Patient left ambulatory.  Reviewed monthly b12 shot dates with the patient and verbalized understanding.

## 2017-01-13 ENCOUNTER — Other Ambulatory Visit: Payer: Self-pay | Admitting: Family Medicine

## 2017-01-13 MED ORDER — INSULIN GLARGINE 100 UNIT/ML SOLOSTAR PEN: 50.0000 [IU] | PEN_INJECTOR | Freq: Every day | SUBCUTANEOUS | 1 refills | Status: DC

## 2017-01-15 ENCOUNTER — Other Ambulatory Visit: Payer: Self-pay

## 2017-01-15 MED ORDER — INSULIN GLARGINE 100 UNIT/ML SOLOSTAR PEN: 50.0000 [IU] | PEN_INJECTOR | Freq: Every day | SUBCUTANEOUS | 1 refills | Status: DC

## 2017-01-27 ENCOUNTER — Encounter (HOSPITAL_COMMUNITY): Payer: Medicare HMO | Attending: Oncology

## 2017-01-27 ENCOUNTER — Encounter (HOSPITAL_COMMUNITY): Payer: Self-pay

## 2017-01-27 VITALS — BP 155/78 | HR 90 | Resp 16

## 2017-01-27 DIAGNOSIS — E538 Deficiency of other specified B group vitamins: Secondary | ICD-10-CM | POA: Insufficient documentation

## 2017-01-27 DIAGNOSIS — Z23 Encounter for immunization: Secondary | ICD-10-CM

## 2017-01-27 DIAGNOSIS — D508 Other iron deficiency anemias: Secondary | ICD-10-CM | POA: Insufficient documentation

## 2017-01-27 MED ORDER — CYANOCOBALAMIN 1000 MCG/ML IJ SOLN
1000.0000 ug | Freq: Once | INTRAMUSCULAR | Status: AC
  Administered 2017-01-27: 1000 ug via INTRAMUSCULAR

## 2017-01-27 MED ORDER — CYANOCOBALAMIN 1000 MCG/ML IJ SOLN
INTRAMUSCULAR | Status: AC
  Filled 2017-01-27: qty 1

## 2017-01-27 MED ORDER — INFLUENZA VAC SPLIT QUAD 0.5 ML IM SUSY
0.5000 mL | PREFILLED_SYRINGE | Freq: Once | INTRAMUSCULAR | Status: AC
  Administered 2017-01-27: 0.5 mL via INTRAMUSCULAR

## 2017-01-27 NOTE — Progress Notes (Signed)
Helen Mahoney presents today for injection per MD orders. B12 1,014mcg administered IM in right Upper Arm. Administration without incident. Patient tolerated well.   Helen Mahoney presents today for injection per MD orders.  Fluarix 0.5 ml administered IM  in left Upper Arm. Administration without incident. Patient tolerated well.  Treatment given per orders. Patient tolerated it well without problems. Vitals stable and discharged home from clinic ambulatory. Follow up as scheduled.

## 2017-01-27 NOTE — Patient Instructions (Signed)
[place] at [facility] Discharge Instructions  RECOMMENDATIONS MADE BY THE CONSULTANT AND ANY TEST RESULTS WILL BE SENT TO YOUR REFERRING PHYSICIAN.  B12 injection and flu shot given today. Follow up as scheduled.  Thank you for choosing [place] at [facility] to provide your oncology and hematology care.  To afford each patient quality time with our provider, please arrive at least 15 minutes before your scheduled appointment time.    If you have a lab appointment with the [place] please come in thru the  Main Entrance and check in at the main information desk  You need to re-schedule your appointment should you arrive 10 or more minutes late.  We strive to give you quality time with our providers, and arriving late affects you and other patients whose appointments are after yours.  Also, if you no show three or more times for appointments you may be dismissed from the clinic at the providers discretion.     Again, thank you for choosing [facility].  Our hope is that these requests will decrease the amount of time that you wait before being seen by our physicians.       _____________________________________________________________  Should you have questions after your visit to [facility], please contact our office at [phone] between the hours of 8:30 a.m. and 4:30 p.m.  Voicemails left after 4:30 p.m. will not be returned until the following business day.  For prescription refill requests, have your pharmacy contact our office.       Resources For Cancer Patients and their Caregivers ? American Cancer Society: Can assist with transportation, wigs, general needs, runs Look Good Feel Better.        (732)873-7678 ? Cancer Care: Provides financial assistance, online support groups, medication/co-pay assistance.  1-800-813-HOPE (862) 099-6975) ? [place] Assists [place]  cancer patients and their families through emotional , educational and financial support.  (302) 256-5005 ? [place] DSS Where to apply for food stamps, Medicaid and utility assistance. 873-474-0461 ? [facility]: Transportation to medical appointments. (548)622-5427 ? Social Security Administration: May apply for disability if have a Stage IV cancer. 219 116 6217 225 725 3790 ? [employer], Disability and Transit Services: Assists with nutrition, care and transit needs. [place] Support Programs: @10RELATIVEDAYS @ > Cancer Support Group  2nd Tuesday of the month 1pm-2pm, Journey Room  > Creative Journey  3rd Tuesday of the month 1130am-1pm, Journey Room  > Look Good Feel Better  1st Wednesday of the month 10am-12 noon, Journey Room (Call [place] to register [phone])

## 2017-02-10 ENCOUNTER — Other Ambulatory Visit: Payer: Self-pay | Admitting: Family Medicine

## 2017-02-11 ENCOUNTER — Other Ambulatory Visit: Payer: Self-pay | Admitting: Family Medicine

## 2017-02-17 ENCOUNTER — Ambulatory Visit (HOSPITAL_COMMUNITY)
Admission: RE | Admit: 2017-02-17 | Discharge: 2017-02-17 | Disposition: A | Discharge status: Home / Self Care | Payer: Medicare HMO | Source: Ambulatory Visit | Attending: Family Medicine | Admitting: Family Medicine

## 2017-02-17 DIAGNOSIS — Z1231 Encounter for screening mammogram for malignant neoplasm of breast: Secondary | ICD-10-CM | POA: Insufficient documentation

## 2017-02-17 DIAGNOSIS — Z1239 Encounter for other screening for malignant neoplasm of breast: Secondary | ICD-10-CM

## 2017-02-21 ENCOUNTER — Other Ambulatory Visit (HOSPITAL_COMMUNITY): Payer: Medicare HMO

## 2017-02-21 ENCOUNTER — Ambulatory Visit (HOSPITAL_COMMUNITY): Payer: Medicare HMO

## 2017-02-27 ENCOUNTER — Encounter (HOSPITAL_COMMUNITY): Payer: Self-pay | Admitting: Oncology

## 2017-02-27 ENCOUNTER — Encounter (HOSPITAL_BASED_OUTPATIENT_CLINIC_OR_DEPARTMENT_OTHER): Payer: Medicare HMO | Admitting: Oncology

## 2017-02-27 ENCOUNTER — Encounter (HOSPITAL_COMMUNITY): Payer: Medicare HMO

## 2017-02-27 ENCOUNTER — Encounter (HOSPITAL_COMMUNITY): Payer: Medicare HMO | Attending: Oncology

## 2017-02-27 VITALS — BP 152/74 | HR 98 | Temp 98.3°F | Resp 18 | Wt 253.0 lb

## 2017-02-27 DIAGNOSIS — D508 Other iron deficiency anemias: Secondary | ICD-10-CM | POA: Insufficient documentation

## 2017-02-27 DIAGNOSIS — E538 Deficiency of other specified B group vitamins: Secondary | ICD-10-CM

## 2017-02-27 LAB — CBC WITH DIFFERENTIAL/PLATELET
BASOS PCT: 1 %
Basophils Absolute: 0 10*3/uL (ref 0.0–0.1)
EOS ABS: 0.4 10*3/uL (ref 0.0–0.7)
Eosinophils Relative: 5 %
HCT: 33.5 % — ABNORMAL LOW (ref 36.0–46.0)
Hemoglobin: 10.1 g/dL — ABNORMAL LOW (ref 12.0–15.0)
LYMPHS PCT: 22 %
Lymphs Abs: 1.9 10*3/uL (ref 0.7–4.0)
MCH: 21.2 pg — ABNORMAL LOW (ref 26.0–34.0)
MCHC: 30.1 g/dL (ref 30.0–36.0)
MCV: 70.2 fL — ABNORMAL LOW (ref 78.0–100.0)
Monocytes Absolute: 0.5 10*3/uL (ref 0.1–1.0)
Monocytes Relative: 6 %
NEUTROS ABS: 5.5 10*3/uL (ref 1.7–7.7)
NEUTROS PCT: 67 %
Platelets: 313 10*3/uL (ref 150–400)
RBC: 4.77 MIL/uL (ref 3.87–5.11)
RDW: 15.7 % — AB (ref 11.5–15.5)
WBC: 8.3 10*3/uL (ref 4.0–10.5)

## 2017-02-27 LAB — COMPREHENSIVE METABOLIC PANEL
ALBUMIN: 3.3 g/dL — AB (ref 3.5–5.0)
ALT: 22 U/L (ref 14–54)
AST: 23 U/L (ref 15–41)
Alkaline Phosphatase: 124 U/L (ref 38–126)
Anion gap: 10 (ref 5–15)
BUN: 13 mg/dL (ref 6–20)
CO2: 25 mmol/L (ref 22–32)
Calcium: 9.1 mg/dL (ref 8.9–10.3)
Chloride: 100 mmol/L — ABNORMAL LOW (ref 101–111)
Creatinine, Ser: 0.96 mg/dL (ref 0.44–1.00)
GFR calc Af Amer: >60 mL/min (ref >60)
GFR calc non Af Amer: >60 mL/min (ref >60)
GLUCOSE: 292 mg/dL — AB (ref 65–99)
POTASSIUM: 4.1 mmol/L (ref 3.5–5.1)
SODIUM: 135 mmol/L (ref 135–145)
TOTAL PROTEIN: 7.3 g/dL (ref 6.5–8.1)
Total Bilirubin: 0.4 mg/dL (ref 0.3–1.2)

## 2017-02-27 LAB — VITAMIN B12: Vitamin B-12: 511 pg/mL (ref 180–914)

## 2017-02-27 LAB — IRON AND TIBC
Iron: 33 ug/dL (ref 28–170)
SATURATION RATIOS: 12 % (ref 10.4–31.8)
TIBC: 286 ug/dL (ref 250–450)
UIBC: 253 ug/dL

## 2017-02-27 LAB — FERRITIN: Ferritin: 130 ng/mL (ref 11–307)

## 2017-02-27 MED ORDER — CYANOCOBALAMIN 1000 MCG/ML IJ SOLN
INTRAMUSCULAR | Status: AC
  Filled 2017-02-27: qty 1

## 2017-02-27 MED ORDER — CYANOCOBALAMIN 1000 MCG/ML IJ SOLN
1000.0000 ug | Freq: Once | INTRAMUSCULAR | Status: AC
  Administered 2017-02-27: 1000 ug via INTRAMUSCULAR

## 2017-02-27 NOTE — Progress Notes (Signed)
[place] at [place] Note  Patient Care Team: Fayrene Helper, MD as PCP - General Fields, Marga Melnick, MD as Consulting Physician (Gastroenterology) Whitney Muse, Kelby Fam, MD (Inactive) as Consulting Physician (Hematology and Oncology)  CHIEF COMPLAINTS/PURPOSE OF CONSULTATION:  Microcytic Anemia B12 deficiency  HISTORY OF PRESENTING ILLNESS:  Helen Mahoney 66 y.o. female is here for follow-up of microcytic anemia, B12 deficiency.  Patient states she has been doing well and has no complaints today. She has not had any new medical problems since her last visit and no new medications. She has been coming for her monthly B12 injections. Denies blood in stool, melena, chest pain, SOB, abdominal pain, weight loss, loss of appetite, or any other concerns.   MEDICAL HISTORY:  Past Medical History:  Diagnosis Date  . 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 B12 deficiency 06/16/2015  . Colon polyps   . Diabetes mellitus, type 2 ([place])   . Hyperlipidemia   . Hypertension   . Microcytic anemia 05/18/2015   2013: TCS/EGD 2017: TCS/EGD HYPERPLASTIC GASTRIC POLYPS, LYMPHOCYTIC GASTRITIS   . Obesity     SURGICAL HISTORY: Past Surgical History:  Procedure Laterality Date  . bilateral tubal ligation  1979  . CHOLECYSTECTOMY  2001   ACUTE CHOLECYSTITIS/GALLSTONES  . COLONOSCOPY  05/14/2011   Dr. Oneida Alar: sessile polyp in sigmoid colon, internal hemorrhoids, hyerplastic polyps  . COLONOSCOPY N/A 05/15/2015   Procedure: COLONOSCOPY;  Surgeon: Danie Binder, MD;  Location: [facility];  Service: Endoscopy;  Laterality: N/A;  0830  . COLONOSCOPY W/ POLYPECTOMY  NOV 2008 MJ ANEMIA   POLYP NO RETRIEVED  . COLONOSCOPY W/ POLYPECTOMY  2006 DR. SMTH   POLYP?  . ESOPHAGOGASTRODUODENOSCOPY  05/14/2011   Dr. Oneida Alar: sessile polyps in the cardia, mild gastritis. Chronic  duodenitis consistent with peptic duodenitis, chronic active H.pylori gastritis.   Marland Kitchen ESOPHAGOGASTRODUODENOSCOPY N/A 05/15/2015   Procedure: ESOPHAGOGASTRODUODENOSCOPY (EGD);  Surgeon: Danie Binder, MD;  Location: [facility];  Service: Endoscopy;  Laterality: N/A;  . GIVENS CAPSULE STUDY N/A 06/02/2015   Procedure: GIVENS CAPSULE STUDY;  Surgeon: Danie Binder, MD;  Location: [facility];  Service: Endoscopy;  Laterality: N/A;  0700  . TUBAL LIGATION    . UPPER GASTROINTESTINAL ENDOSCOPY  NOV 2008 MJ ANEMIA   NL EXAM, urease neg    SOCIAL HISTORY: Social History   Social History  . Marital status: Married    Spouse name: N/A  . Number of children: N/A  . Years of education: N/A   Occupational History  . Not on file.   Social History Main Topics  . Smoking status: Never Smoker  . Smokeless tobacco: Never Used  . Alcohol use No  . Drug use: No  . Sexual activity: Not Currently   Other Topics Concern  . Not on file   Social History Narrative  . No narrative on file  Married 45 years 2 children, both boys 4 grandchildren She does not work outside the home She enjoys shopping Non smoker ETOH, none  FAMILY HISTORY: Family History  Problem Relation Age of Onset  . Heart disease Mother   . Diabetes Mother   . Hypertension Mother   . Stroke Mother   . Breast cancer Mother   . Diabetes Father   . Heart attack Father   . Kidney disease Father   . Hypertension Sister   . Diabetes  Sister   . Diabetes Sister   . Mental illness Sister   . Hypertension Sister   . Hepatitis C Sister   . Mental illness Sister   . Diabetes Sister   . Hypertension Sister   . Mental illness Sister   . Colon cancer Sister 54  . Diabetes Brother    indicated that her mother is deceased. She indicated that her father is deceased. She indicated that all of her four sisters are alive. She indicated that both of her brothers are deceased. She indicated that both of her sons are alive.    Mother deceased in her 1s Father deceased in his 69s. One parent died of heart trouble and another of pneumonia Her sibling died of excessive drinking Her sister had rectal colon cancer, diagnosed 6-7 years ago. She had surgery with Dr. Laural Golden. No chemotherapy or radiation treatment.  ALLERGIES:  is allergic to tetanus toxoids and penicillins.  MEDICATIONS:  Current Outpatient Prescriptions  Medication Sig Dispense Refill  . aspirin 81 MG tablet Take 81 mg by mouth daily.    . Calcium Citrate-Vitamin D (CITRACAL PETITES/VITAMIN D) 200-250 MG-UNIT TABS Take 1 tablet by mouth daily.     . Cholecalciferol (VITAMIN D3) 2000 units TABS Take 1 tablet by mouth daily.    . Cholecalciferol (VITAMIN D3) 50000 units CAPS TAKE 1 CAPSULE ONE TIME WEEKLY (Patient not taking: Reported on 11/19/2016) 12 capsule 1  . Ferrous Sulfate (IRON) 325 (65 FE) MG TABS Take 1 tablet by mouth daily. 100 each 2  . glipiZIDE (GLUCOTROL) 10 MG tablet TAKE 1 TABLET TWICE DAILY BEFORE MEALS 180 tablet 1  . Insulin Glargine (LANTUS SOLOSTAR) 100 UNIT/ML Solostar Pen Inject 50 Units into the skin daily at 10 pm. 45 mL 1  . Insulin Pen Needle (RELION SHORT PEN NEEDLES) 31G X 8 MM MISC USE ONE ONCE DAILY 50 each 5  . metFORMIN (GLUCOPHAGE) 1000 MG tablet TAKE 1 TABLET TWICE DAILY WITH MEALS 180 tablet 1  . Multiple Vitamin (MULTIVITAMINS PO) Take 1 tablet by mouth daily.     . pravastatin (PRAVACHOL) 80 MG tablet TAKE 1 TABLET ONE TIME DAILY IN THE EVENING 90 tablet 2  . RELION PEN NEEDLE 31G/8MM 31G X 8 MM MISC USE ONE  ONCE DAILY AS DIRECTED 50 each 5  . TRUE METRIX BLOOD GLUCOSE TEST test strip TEST THREE TIMES DAILY 300 each 5   No current facility-administered medications for this visit.     Review of Systems  Constitutional: Negative.  Negative for malaise/fatigue and weight loss.  HENT: Negative.   Eyes: Negative.   Respiratory: Negative.  Negative for shortness of breath.   Cardiovascular: Negative.  Negative  for chest pain.  Gastrointestinal: Negative.  Negative for abdominal pain, blood in stool and melena.  Genitourinary: Negative.   Musculoskeletal: Negative.   Skin: Negative.   Neurological: Negative.   Endo/Heme/Allergies: Negative.   Psychiatric/Behavioral: Negative.   All other systems reviewed and are negative. 14 point ROS was done and is otherwise as detailed above or in HPI  PHYSICAL EXAMINATION: ECOG PERFORMANCE STATUS: 0 - Asymptomatic  There were no vitals filed for this visit. There were no vitals filed for this visit. Physical Exam  Constitutional: She is oriented to person, place, and time and well-developed, well-nourished, and in no distress.  HENT:  Head: Normocephalic and atraumatic.  Nose: Nose normal.  Mouth/Throat: Oropharynx is clear and moist. No oropharyngeal exudate.  Eyes: Pupils are equal, round, and reactive to light.  Conjunctivae and EOM are normal. Right eye exhibits no discharge. Left eye exhibits no discharge. No scleral icterus.  Neck: Normal range of motion. Neck supple. No tracheal deviation present. No thyromegaly present.  Cardiovascular: Normal rate, regular rhythm and normal heart sounds.  Exam reveals no gallop and no friction rub.   No murmur heard. Pulmonary/Chest: Effort normal and breath sounds normal. She has no wheezes. She has no rales.  Abdominal: Soft. Bowel sounds are normal. She exhibits no distension and no mass. There is no tenderness. There is no rebound and no guarding.  Musculoskeletal: Normal range of motion. She exhibits no edema.  Lymphadenopathy:    She has no cervical adenopathy.  Neurological: She is alert and oriented to person, place, and time. She has normal reflexes. No cranial nerve deficit. Gait normal. Coordination normal.  Skin: Skin is warm and dry. No rash noted.  Psychiatric: Mood, memory, affect and judgment normal.  Nursing note and vitals reviewed.  LABORATORY DATA:  I have reviewed the data as listed CBC  Latest Ref Rng & Units 02/27/2017 08/22/2016 02/22/2016  WBC 4.0 - 10.5 K/uL 8.3 9.7 8.6  Hemoglobin 12.0 - 15.0 g/dL 10.1(L) 11.0(L) 10.8(L)  Hematocrit 36.0 - 46.0 % 33.5(L) 35.8(L) 35.0(L)  Platelets 150 - 400 K/uL 313 300 361   CMP Latest Ref Rng & Units 02/27/2017 11/04/2016 08/22/2016  Glucose 65 - 99 mg/dL 292(H) 149(H) 212(H)  BUN 6 - 20 mg/dL 13 12 13   Creatinine 0.44 - 1.00 mg/dL 0.96 0.94 1.04(H)  Sodium 135 - 145 mmol/L 135 137 136  Potassium 3.5 - 5.1 mmol/L 4.1 4.5 3.7  Chloride 101 - 111 mmol/L 100(L) 100 101  CO2 22 - 32 mmol/L 25 26 24   Calcium 8.9 - 10.3 mg/dL 9.1 9.4 8.9  Total Protein 6.5 - 8.1 g/dL 7.3 7.0 7.2  Total Bilirubin 0.3 - 1.2 mg/dL 0.4 0.4 0.4  Alkaline Phos 38 - 126 U/L 124 121 115  AST 15 - 41 U/L 23 18 23   ALT 14 - 54 U/L 22 19 20     PATHOLOGY   RADIOGRAPHIC STUDIES: I have personally reviewed the radiological images as listed and agreed with the findings in the report.  DEXA Scan 02/14/2016 BMD as determined from [facility] L1-L3 is 1.072 g/cm2 with a T-Score of -0.8. This patient is considered normal according to [facility]) criteria. (L-4 was excluded due to advanced degenerative changes.) (Patient is not a candidate for FRAX assessment due to normal bone density exam.)  World Health Organization [facility]) criteria for post-menopausal, Caucasian Women: Normal:       T-score at or above -1 SD Osteopenia:   T-score between -1 and -2.5 SD Osteoporosis: T-score at or below -2.5 SD  ASSESSMENT & PLAN:  Microcytic Anemia longstanding dating back to 2010 Serum ferritin 38 ng/ml Colonoscopy 05/15/2015 with redundant L colon, diverticulosis, small internal hemorrhoids EGD 05/14/2014 with gastric polyps, GE junction stricture Givens Capsule 06/02/2015 OCCASIONAL EROSION IN DUODENUM No masses, ULCERS, or AVMs SEEN. OCCASIONAL LYMPHANGECTASIA B12 deficiency   Labs reviewed. Hgb 10.1 today. Iron studies and B12 levels  pending.  Continue B12 injections.  Continue surveillance of labs.  She will return for follow up in 6 months with labs.   Orders Placed This Encounter  Procedures  . CBC with Differential    Standing Status:   Future    Standing Expiration Date:   02/27/2018  . Comprehensive metabolic panel    Standing Status:   Future  Standing Expiration Date:   02/27/2018  . Ferritin    Standing Status:   Future    Standing Expiration Date:   02/27/2018  . Iron and TIBC    Standing Status:   Future    Standing Expiration Date:   02/27/2018  . Vitamin B12    Standing Status:   Future    Standing Expiration Date:   02/27/2018    All questions were answered. The patient knows to call the clinic with any problems, questions or concerns.  This note was electronically signed.   Twana First  02/27/2017 10:48 AM

## 2017-02-27 NOTE — Progress Notes (Signed)
Helen Mahoney presents today for injection per the provider's orders.  B12 administration without incident; see MAR for injection details.  Patient tolerated procedure well and without incident.  No questions or complaints noted at this time.  Discharged ambulatory.  

## 2017-02-27 NOTE — Patient Instructions (Signed)
[facility] at [facility] Discharge Instructions  RECOMMENDATIONS MADE BY THE CONSULTANT AND ANY TEST RESULTS WILL BE SENT TO YOUR REFERRING PHYSICIAN.  You were seen today by Dr. Louise Zhou Follow up in 6 months with labs   Thank you for choosing [facility] at [facility] to provide your oncology and hematology care.  To afford each patient quality time with our provider, please arrive at least 15 minutes before your scheduled appointment time.    If you have a lab appointment with the Cancer Center please come in thru the  Main Entrance and check in at the main information desk  You need to re-schedule your appointment should you arrive 10 or more minutes late.  We strive to give you quality time with our providers, and arriving late affects you and other patients whose appointments are after yours.  Also, if you no show three or more times for appointments you may be dismissed from the clinic at the providers discretion.     Again, thank you for choosing [facility].  Our hope is that these requests will decrease the amount of time that you wait before being seen by our physicians.       _____________________________________________________________  Should you have questions after your visit to [facility], please contact our office at [phone] between the hours of 8:30 a.m. and 4:30 p.m.  Voicemails left after 4:30 p.m. will not be returned until the following business day.  For prescription refill requests, have your pharmacy contact our office.       Resources For Cancer Patients and their Caregivers ? American Cancer Society: Can assist with transportation, wigs, general needs, runs Look Good Feel Better.        1-888-227-6333 ? Cancer Care: Provides financial assistance, online support groups, medication/co-pay assistance.  1-800-813-HOPE (4673) ? [facility] Assists  [place] cancer patients and their families through emotional , educational and financial support.  336-427-4357 ? [place] DSS Where to apply for food stamps, Medicaid and utility assistance. 336-342-1394 ? [facility]: Transportation to medical appointments. 336-347-2287 ? Social Security Administration: May apply for disability if have a Stage IV cancer. 336-342-7796 1-800-772-1213 ? [place] Aging, Disability and Transit Services: Assists with nutrition, care and transit needs. 336-349-2343  Cancer Center Support Programs: [email or web address]@ > Cancer Support Group  2nd Tuesday of the month 1pm-2pm, Journey Room  > Creative Journey  3rd Tuesday of the month 1130am-1pm, Journey Room  > Look Good Feel Better  1st Wednesday of the month 10am-12 noon, Journey Room (Call American Cancer Society to register 1-800-395-5775)    

## 2017-03-01 IMAGING — MG MM DIGITAL SCREENING
4 series · 4 of 4 positions shown · non-contrast
Comparison: Previous exam(s).

ACR Breast Density Category a: The breast tissue is almost entirely
fatty.

CLINICAL DATA: Screening.

EXAM:
DIGITAL SCREENING BILATERAL MAMMOGRAM WITH CAD

[R CC]
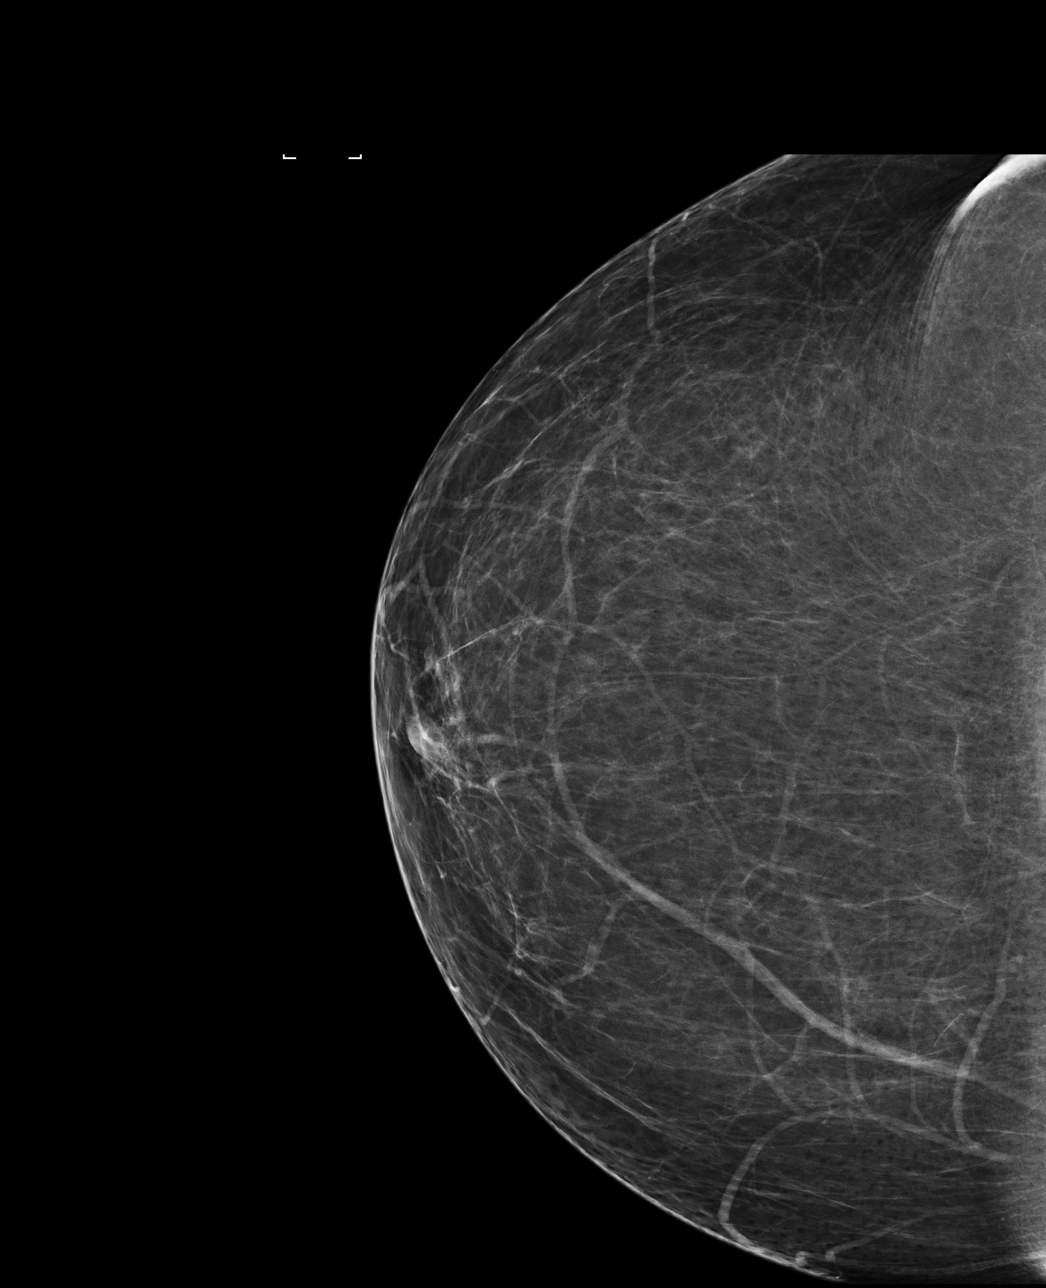

[R MLO]
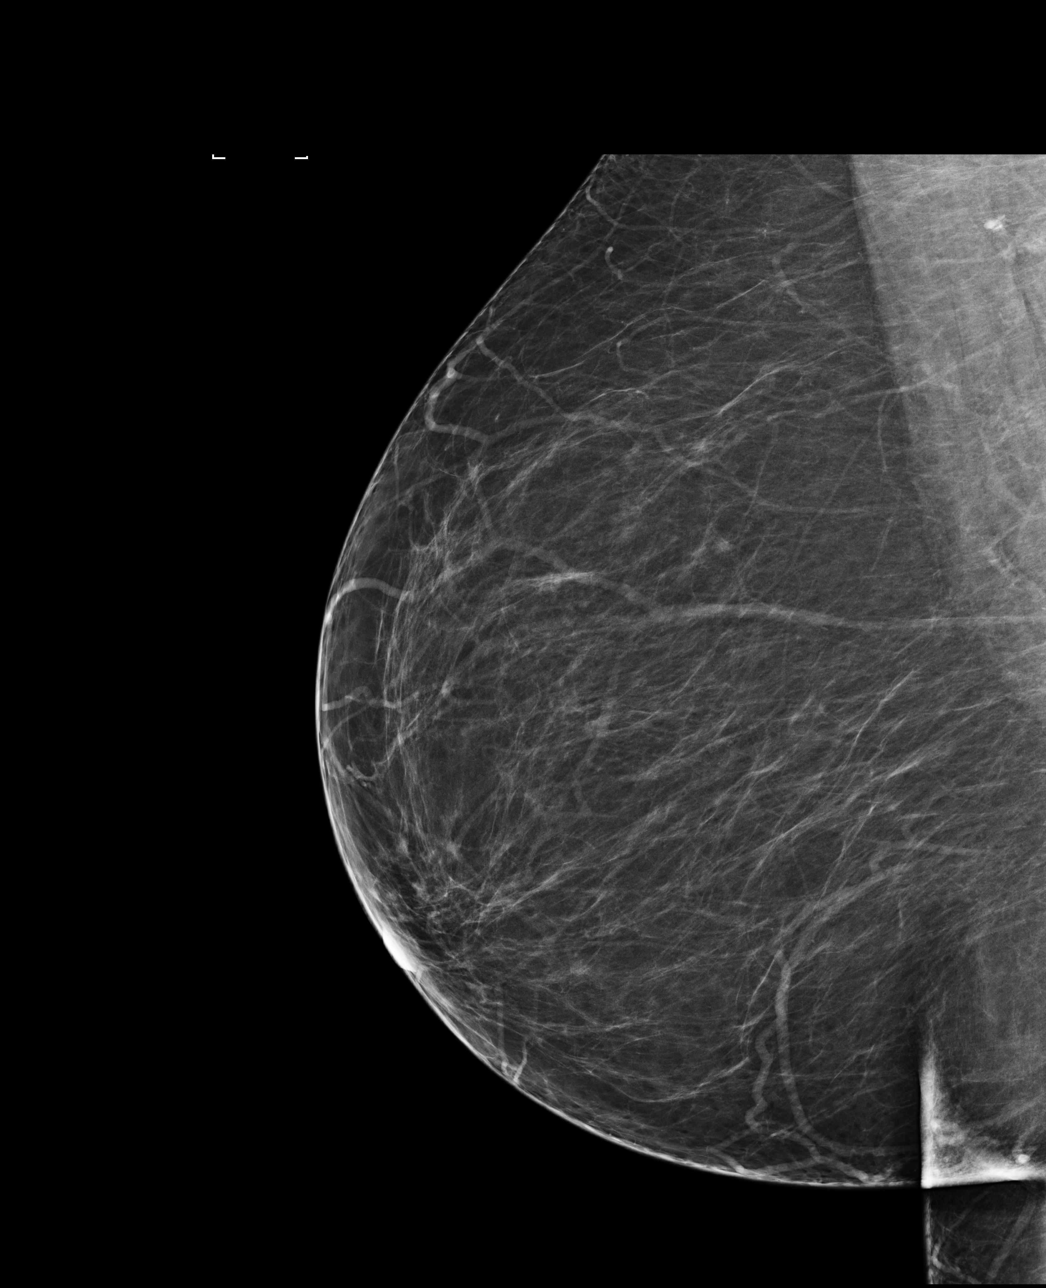

[R CV]
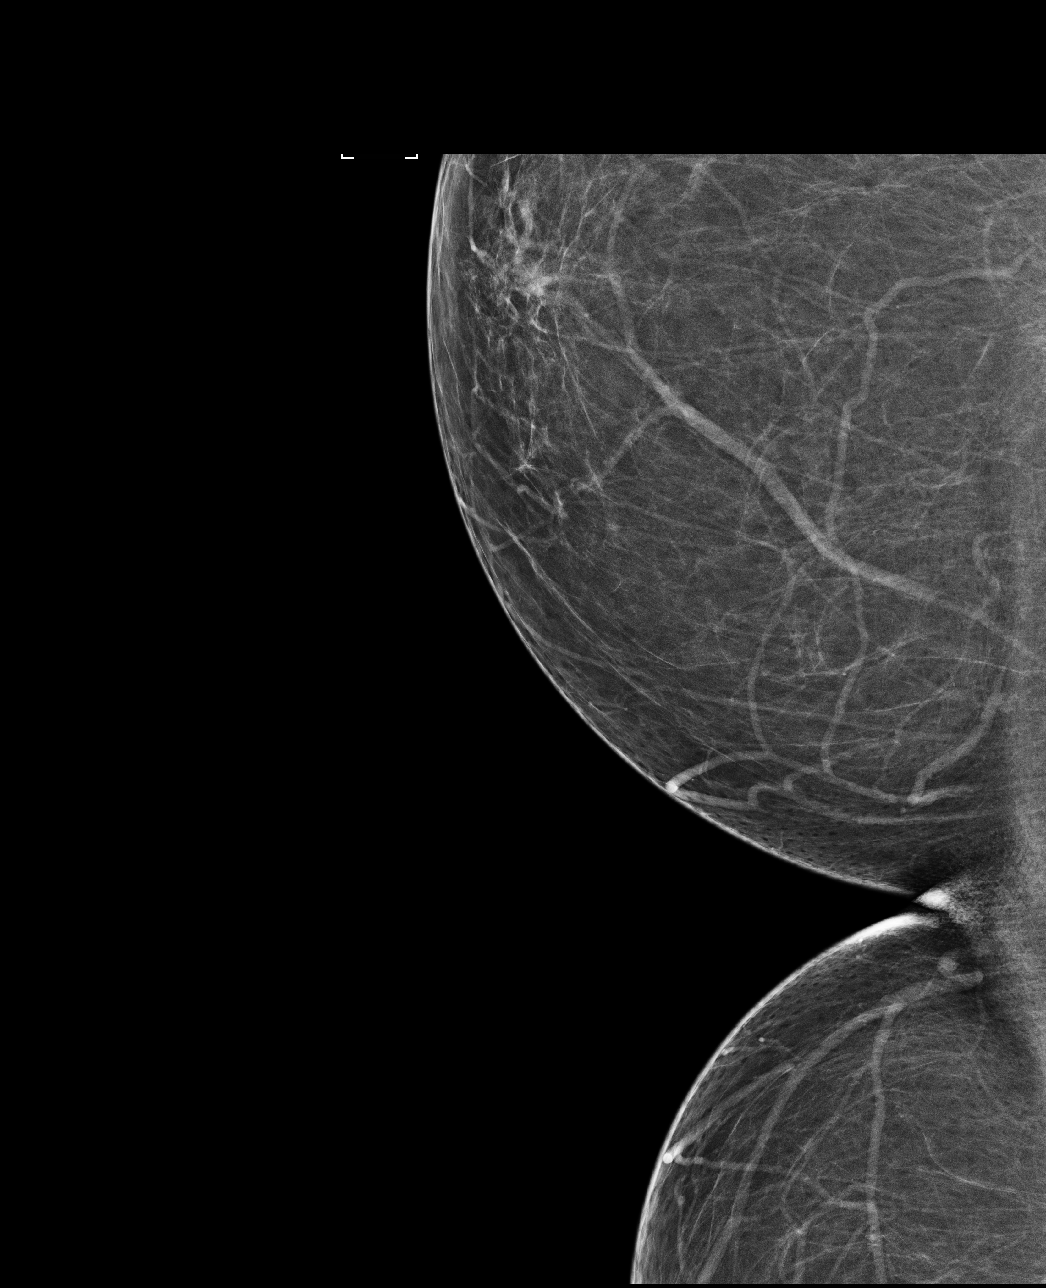

[L MLO]
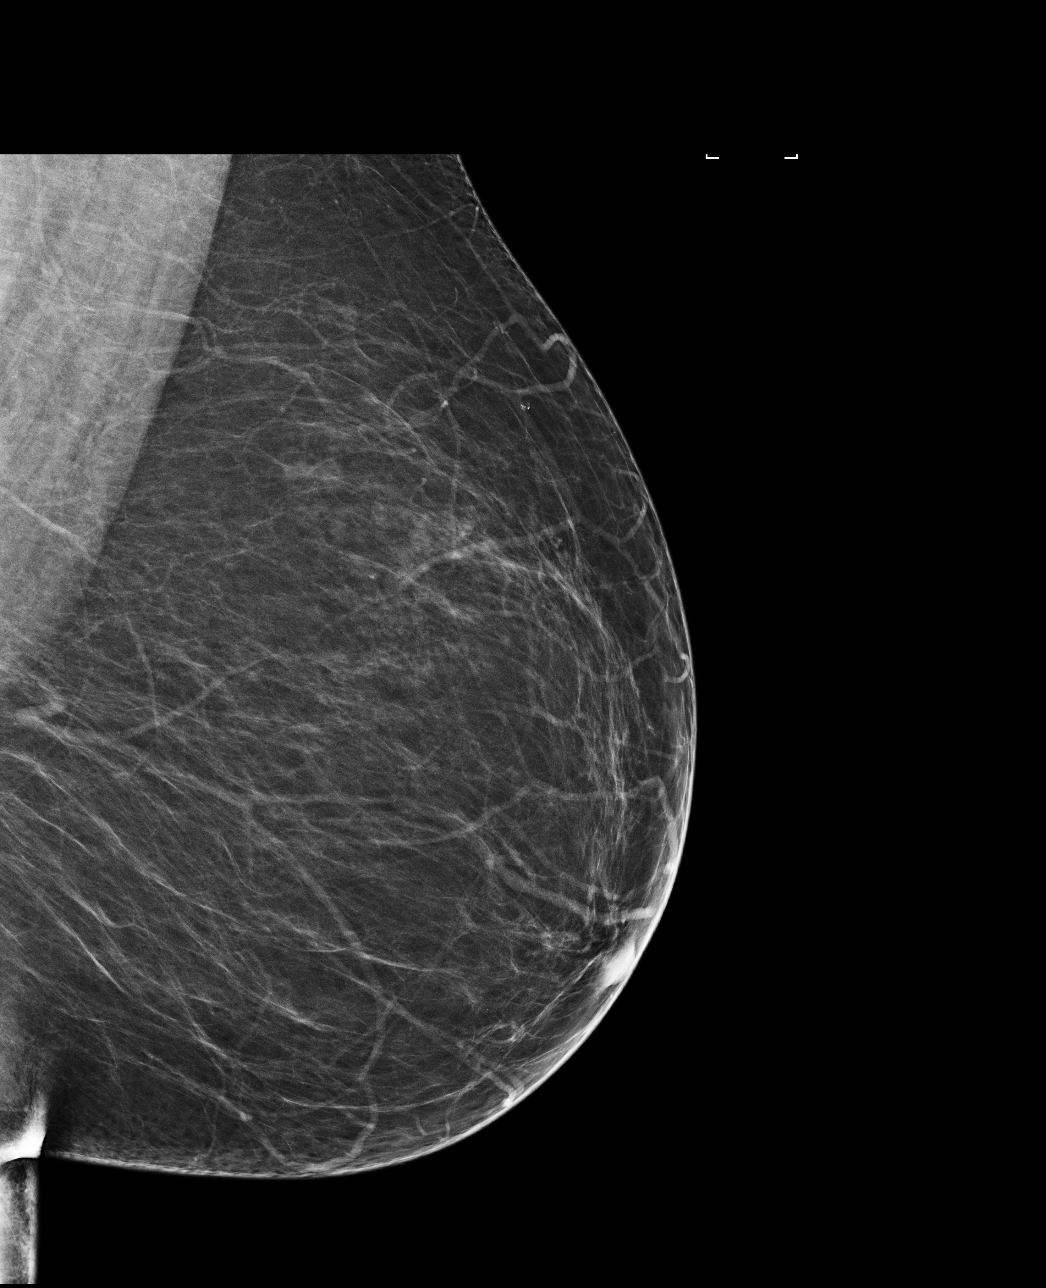

[4 of 4 positions shown; findings below may reference images not displayed]

FINDINGS: There are no findings suspicious for malignancy. Images were
processed with CAD.
IMPRESSION: No mammographic evidence of malignancy. A result letter of this
screening mammogram will be mailed directly to the patient.

RECOMMENDATION:
Screening mammogram in one year. (Code:[id number])

BI-RADS CATEGORY  1: Negative.

## 2017-03-04 ENCOUNTER — Telehealth: Payer: Self-pay

## 2017-03-04 DIAGNOSIS — E785 Hyperlipidemia, unspecified: Secondary | ICD-10-CM | POA: Diagnosis not present

## 2017-03-04 DIAGNOSIS — E1065 Type 1 diabetes mellitus with hyperglycemia: Secondary | ICD-10-CM

## 2017-03-04 NOTE — Telephone Encounter (Signed)
Ordered labs

## 2017-03-05 LAB — LIPID PANEL
CHOL/HDL RATIO: 3.3 (calc) (ref <5.0)
Cholesterol: 177 mg/dL (ref <200)
HDL: 53 mg/dL (ref >50)
LDL CHOLESTEROL (CALC): 105 mg/dL — AB
NON-HDL CHOLESTEROL (CALC): 124 mg/dL (ref <130)
TRIGLYCERIDES: 97 mg/dL (ref <150)

## 2017-03-05 LAB — HEMOGLOBIN A1C
Hgb A1c MFr Bld: 7.3 % of total Hgb — ABNORMAL HIGH (ref <5.7)
Mean Plasma Glucose: 163 (calc)
eAG (mmol/L): 9 (calc)

## 2017-03-11 ENCOUNTER — Ambulatory Visit: Payer: Medicare HMO | Admitting: Family Medicine

## 2017-03-11 ENCOUNTER — Encounter: Payer: Self-pay | Admitting: Family Medicine

## 2017-03-11 VITALS — BP 130/80 | HR 97 | Resp 16 | Ht 65.0 in | Wt 248.0 lb

## 2017-03-11 DIAGNOSIS — I10 Essential (primary) hypertension: Secondary | ICD-10-CM

## 2017-03-11 DIAGNOSIS — E785 Hyperlipidemia, unspecified: Secondary | ICD-10-CM

## 2017-03-11 DIAGNOSIS — E1065 Type 1 diabetes mellitus with hyperglycemia: Secondary | ICD-10-CM | POA: Diagnosis not present

## 2017-03-11 NOTE — Patient Instructions (Signed)
Annual physical exam Dec 15 or after with mD  Microalb today from office   Excellent blood pressure and blood sugar, congrats  It is important that you exercise regularly at least 30 minutes 5 times a week. If you develop chest pain, have severe difficulty breathing, or feel very tired, stop exercising immediately and seek medical attention   Reduce fried and fatty foods as bad cholesterol still above 100  Thank you  for choosing  Primary Care. We consider it a privelige to serve you.  Delivering excellent health care in a caring and  compassionate way is our goal.  Partnering with you,  so that together we can achieve this goal is our strategy.

## 2017-03-13 ENCOUNTER — Other Ambulatory Visit (HOSPITAL_COMMUNITY)
Admission: RE | Admit: 2017-03-13 | Discharge: 2017-03-13 | Disposition: A | Discharge status: Home / Self Care | Payer: Medicare HMO | Source: Other Acute Inpatient Hospital | Attending: Family Medicine | Admitting: Family Medicine

## 2017-03-13 DIAGNOSIS — E1065 Type 1 diabetes mellitus with hyperglycemia: Secondary | ICD-10-CM | POA: Insufficient documentation

## 2017-03-14 LAB — MICROALBUMIN / CREATININE URINE RATIO
CREATININE, UR: 254.4 mg/dL
Microalb Creat Ratio: 16.1 mg/g creat (ref 0.0–30.0)
Microalb, Ur: 40.9 ug/mL — ABNORMAL HIGH

## 2017-03-16 NOTE — Assessment & Plan Note (Signed)
Improved Patient re-educated about  the importance of commitment to a  minimum of 150 minutes of exercise per week.  The importance of healthy food choices with portion control discussed. Encouraged to start a food diary, count calories and to consider  joining a support group. Sample diet sheets offered. Goals set by the patient for the next several months.   Weight /BMI 03/11/2017 02/27/2017 11/19/2016  WEIGHT 248 lb 253 lb 249 lb 8 oz  HEIGHT 5\' 5"  - 5\' 5"   BMI 41.27 kg/m2 42.1 kg/m2 41.52 kg/m2

## 2017-03-16 NOTE — Progress Notes (Signed)
PARVEEN FREEHLING     MRN: [id number]      DOB: 1950/05/02   HPI Ms. Boroff is here for follow up and re-evaluation of chronic medical conditions, medication management and review of any available recent lab and radiology data.  Preventive health is updated, specifically  Cancer screening and Immunization.   Questions or concerns regarding consultations or procedures which the PT has had in the interim are  addressed. The PT denies any adverse reactions to current medications since the last visit.  There are no new concerns.  There are no specific complaints  Denies polyuria, polydipsia, blurred vision , or hypoglycemic episodes.   ROS Denies recent fever or chills. Denies sinus pressure, nasal congestion, ear pain or sore throat. Denies chest congestion, productive cough or wheezing. Denies chest pains, palpitations and leg swelling Denies abdominal pain, nausea, vomiting,diarrhea or constipation.   Denies dysuria, frequency, hesitancy or incontinence. Denies joint pain, swelling and limitation in mobility. Denies headaches, seizures, numbness, or tingling. Denies depression, anxiety or insomnia. Denies skin break down or rash.   PE  BP 130/80   Pulse 97   Resp 16   Ht 5\' 5"  (1.651 m)   Wt 248 lb (112.5 kg)   SpO2 94%   BMI 41.27 kg/m   Patient alert and oriented and in no cardiopulmonary distress.  HEENT: No facial asymmetry, EOMI,   oropharynx pink and moist.  Neck supple no JVD, no mass.  Chest: Clear to auscultation bilaterally.  CVS: S1, S2 no murmurs, no S3.Regular rate.  ABD: Soft non tender.   Ext: No edema  MS: Adequate ROM spine, shoulders, hips and knees.  Skin: Intact, no ulcerations or rash noted.  Psych: Good eye contact, normal affect. Memory intact not anxious or depressed appearing.  CNS: CN 2-12 intact, power,  normal throughout.no focal deficits noted.   Assessment & Plan  Essential hypertension Controlled, no change in  medication DASH diet and commitment to daily physical activity for a minimum of 30 minutes discussed and encouraged, as a part of hypertension management. The importance of attaining a healthy weight is also discussed.  BP/Weight 03/11/2017 02/27/2017 01/27/2017 12/26/2016 11/25/2016 10/23/348 0/12/3816  Systolic BP 299 371 696 789 381 017 510  Diastolic BP 80 74 78 73 77 80 86  Wt. (Lbs) 248 253 - - - 249.5 251.08  BMI 41.27 42.1 - - - 41.52 40.53       Diabetes mellitus, insulin dependent (IDDM), uncontrolled (HCC) mrkedly improved and currently controlled, pt applauded on this, no med change Ms. Luebke is reminded of the importance of commitment to daily physical activity for 30 minutes or more, as able and the need to limit carbohydrate intake to 30 to 60 grams per meal to help with blood sugar control.   The need to take medication as prescribed, test blood sugar as directed, and to call between visits if there is a concern that blood sugar is uncontrolled is also discussed.   Ms. Vandehei is reminded of the importance of daily foot exam, annual eye examination, and good blood sugar, blood pressure and cholesterol control.  Diabetic Labs Latest Ref Rng & Units 03/13/2017 03/04/2017 02/27/2017 11/04/2016 08/22/2016  HbA1c <5.7 % of total Hgb - 7.3(H) - 8.8(H) -  Microalbumin Not Estab. ug/mL 40.9(H) - - - -  Micro/Creat Ratio 0.0 - 30.0 mg/g creat 16.1 - - - -  Chol <200 mg/dL - 177 - 186 -  HDL >50 mg/dL - 53 - 48(L) -  Calc LDL <100 mg/dL - - - 109(H) -  Triglycerides <150 mg/dL - 97 - 145 -  Creatinine 0.44 - 1.00 mg/dL - - 0.96 0.94 1.04(H)   BP/Weight 03/11/2017 02/27/2017 01/27/2017 12/26/2016 11/25/2016 0/73/7106 06/05/9483  Systolic BP 462 703 500 938 182 993 716  Diastolic BP 80 74 78 73 77 80 86  Wt. (Lbs) 248 253 - - - 249.5 251.08  BMI 41.27 42.1 - - - 41.52 40.53   Foot/eye exam completion dates Latest Ref Rng & Units 12/11/2016 04/11/2016  Eye Exam No Retinopathy No  Retinopathy -  Foot exam Order - - -  Foot Form Completion - - Done        Hyperlipidemia LDL goal <100 Hyperlipidemia:Low fat diet discussed and encouraged.   Lipid Panel  Lab Results  Component Value Date   CHOL 177 03/04/2017   HDL 53 03/04/2017   LDLCALC 109 (H) 11/04/2016   TRIG 97 03/04/2017   CHOLHDL 3.3 03/04/2017   Updated lab needed at/ before next visit. Most recent lb LDL elevated, needs to reduce fat in diet   Morbid obesity Improved Patient re-educated about  the importance of commitment to a  minimum of 150 minutes of exercise per week.  The importance of healthy food choices with portion control discussed. Encouraged to start a food diary, count calories and to consider  joining a support group. Sample diet sheets offered. Goals set by the patient for the next several months.   Weight /BMI 03/11/2017 02/27/2017 11/19/2016  WEIGHT 248 lb 253 lb 249 lb 8 oz  HEIGHT 5\' 5"  - 5\' 5"   BMI 41.27 kg/m2 42.1 kg/m2 41.52 kg/m2

## 2017-03-16 NOTE — Assessment & Plan Note (Signed)
Controlled, no change in medication DASH diet and commitment to daily physical activity for a minimum of 30 minutes discussed and encouraged, as a part of hypertension management. The importance of attaining a healthy weight is also discussed.  BP/Weight 03/11/2017 02/27/2017 01/27/2017 12/26/2016 11/25/2016 08/15/3788 06/03/971  Systolic BP 532 992 426 834 196 222 979  Diastolic BP 80 74 78 73 77 80 86  Wt. (Lbs) 248 253 - - - 249.5 251.08  BMI 41.27 42.1 - - - 41.52 40.53

## 2017-03-16 NOTE — Assessment & Plan Note (Signed)
Hyperlipidemia:Low fat diet discussed and encouraged.   Lipid Panel  Lab Results  Component Value Date   CHOL 177 03/04/2017   HDL 53 03/04/2017   LDLCALC 109 (H) 11/04/2016   TRIG 97 03/04/2017   CHOLHDL 3.3 03/04/2017   Updated lab needed at/ before next visit. Most recent lb LDL elevated, needs to reduce fat in diet

## 2017-03-16 NOTE — Assessment & Plan Note (Signed)
mrkedly improved and currently controlled, pt applauded on this, no med change Helen Mahoney is reminded of the importance of commitment to daily physical activity for 30 minutes or more, as able and the need to limit carbohydrate intake to 30 to 60 grams per meal to help with blood sugar control.   The need to take medication as prescribed, test blood sugar as directed, and to call between visits if there is a concern that blood sugar is uncontrolled is also discussed.   Helen Mahoney is reminded of the importance of daily foot exam, annual eye examination, and good blood sugar, blood pressure and cholesterol control.  Diabetic Labs Latest Ref Rng & Units 03/13/2017 03/04/2017 02/27/2017 11/04/2016 08/22/2016  HbA1c <5.7 % of total Hgb - 7.3(H) - 8.8(H) -  Microalbumin Not Estab. ug/mL 40.9(H) - - - -  Micro/Creat Ratio 0.0 - 30.0 mg/g creat 16.1 - - - -  Chol <200 mg/dL - 177 - 186 -  HDL >50 mg/dL - 53 - 48(L) -  Calc LDL <100 mg/dL - - - 109(H) -  Triglycerides <150 mg/dL - 97 - 145 -  Creatinine 0.44 - 1.00 mg/dL - - 0.96 0.94 1.04(H)   BP/Weight 03/11/2017 02/27/2017 01/27/2017 12/26/2016 11/25/2016 3/66/2947 10/01/4648  Systolic BP 354 656 812 751 700 174 944  Diastolic BP 80 74 78 73 77 80 86  Wt. (Lbs) 248 253 - - - 249.5 251.08  BMI 41.27 42.1 - - - 41.52 40.53   Foot/eye exam completion dates Latest Ref Rng & Units 12/11/2016 04/11/2016  Eye Exam No Retinopathy No Retinopathy -  Foot exam Order - - -  Foot Form Completion - - Done

## 2017-03-31 ENCOUNTER — Other Ambulatory Visit: Payer: Self-pay

## 2017-03-31 ENCOUNTER — Encounter (HOSPITAL_COMMUNITY): Payer: Medicare HMO | Attending: Oncology

## 2017-03-31 ENCOUNTER — Encounter (HOSPITAL_COMMUNITY): Payer: Self-pay

## 2017-03-31 VITALS — BP 146/77 | HR 99 | Temp 97.9°F | Resp 20

## 2017-03-31 DIAGNOSIS — D508 Other iron deficiency anemias: Secondary | ICD-10-CM | POA: Insufficient documentation

## 2017-03-31 DIAGNOSIS — E538 Deficiency of other specified B group vitamins: Secondary | ICD-10-CM

## 2017-03-31 MED ORDER — CYANOCOBALAMIN 1000 MCG/ML IJ SOLN
1000.0000 ug | Freq: Once | INTRAMUSCULAR | Status: AC
  Administered 2017-03-31: 1000 ug via INTRAMUSCULAR
  Filled 2017-03-31: qty 1

## 2017-03-31 NOTE — Progress Notes (Signed)
Helen Mahoney presents today for injection per MD orders. B12 1011mcg administered IM in right deltoid. Administration without incident. Patient tolerated well.

## 2017-04-30 ENCOUNTER — Other Ambulatory Visit: Payer: Self-pay

## 2017-04-30 ENCOUNTER — Encounter (HOSPITAL_COMMUNITY): Payer: Self-pay

## 2017-04-30 ENCOUNTER — Inpatient Hospital Stay (HOSPITAL_COMMUNITY): Payer: Medicare HMO | Attending: Hematology and Oncology

## 2017-04-30 VITALS — BP 147/75 | HR 94 | Temp 98.0°F | Resp 18

## 2017-04-30 DIAGNOSIS — E538 Deficiency of other specified B group vitamins: Secondary | ICD-10-CM | POA: Diagnosis not present

## 2017-04-30 MED ORDER — CYANOCOBALAMIN 1000 MCG/ML IJ SOLN
1000.0000 ug | Freq: Once | INTRAMUSCULAR | Status: AC
  Administered 2017-04-30: 1000 ug via INTRAMUSCULAR

## 2017-04-30 NOTE — Progress Notes (Signed)
Helen Mahoney presents today for injection per the provider's orders.  B12 administration without incident; see MAR for injection details.  Patient tolerated procedure well and without incident.  No questions or complaints noted at this time.  Discharged ambulatory.  

## 2017-05-21 ENCOUNTER — Encounter: Payer: Self-pay | Admitting: Family Medicine

## 2017-05-21 ENCOUNTER — Ambulatory Visit (INDEPENDENT_AMBULATORY_CARE_PROVIDER_SITE_OTHER): Payer: Medicare HMO | Admitting: Family Medicine

## 2017-05-21 VITALS — BP 130/80 | HR 90 | Resp 16 | Ht 65.0 in | Wt 246.0 lb

## 2017-05-21 DIAGNOSIS — E785 Hyperlipidemia, unspecified: Secondary | ICD-10-CM

## 2017-05-21 DIAGNOSIS — Z1211 Encounter for screening for malignant neoplasm of colon: Secondary | ICD-10-CM

## 2017-05-21 DIAGNOSIS — Z0000 Encounter for general adult medical examination without abnormal findings: Secondary | ICD-10-CM

## 2017-05-21 DIAGNOSIS — E1065 Type 1 diabetes mellitus with hyperglycemia: Secondary | ICD-10-CM

## 2017-05-21 LAB — POC HEMOCCULT BLD/STL (OFFICE/1-CARD/DIAGNOSTIC): Fecal Occult Blood, POC: NEGATIVE

## 2017-05-21 NOTE — Progress Notes (Signed)
    Helen Mahoney     MRN: [id number]      DOB: 03/12/1951  HPI: Patient is in for annual physical exam. No other health concerns are expressed or addressed at the visit. Recent labs, if available are reviewed. Immunization is reviewed , and  updated if needed.   PE: BP 130/80   Pulse 90   Resp 16   Ht 5\' 5"  (1.651 m)   Wt 246 lb (111.6 kg)   SpO2 94%   BMI 40.94 kg/m   Pleasant  female, alert and oriented x 3, in no cardio-pulmonary distress. Afebrile. HEENT No facial trauma or asymetry. Sinuses non tender.  Extra occullar muscles intact, pupils equally reactive to light. External ears normal, tympanic membranes clear. Oropharynx moist, no exudate. Neck: supple, no adenopathy,JVD or thyromegaly.No bruits.  Chest: Clear to ascultation bilaterally.No crackles or wheezes. Non tender to palpation  Breast: No asymetry,no masses or lumps. No tenderness. No nipple discharge or inversion. No axillary or supraclavicular adenopathy  Cardiovascular system; Heart sounds normal,  S1 and  S2 ,no S3.  No murmur, or thrill. Apical beat not displaced Peripheral pulses normal.  Abdomen: Soft, non tender, no organomegaly or masses. No bruits. Bowel sounds normal. No guarding, tenderness or rebound.  Rectal:  Normal sphincter tone. No rectal mass. Guaiac negative stool.  GU: Not examined   Musculoskeletal exam: Full ROM of spine, hips , shoulders and reduced in  knees. No deformity ,swelling or crepitus noted. No muscle wasting or atrophy.   Neurologic: Cranial nerves 2 to 12 intact. Power, tone ,sensation and reflexes normal throughout. No disturbance in gait. No tremor.  Skin: Intact, no ulceration, erythema , scaling or rash noted. Pigmentation normal throughout  Psych; Normal mood and affect. Judgement and concentration normal   Assessment & Plan:  Annual physical exam Annual exam as documented. Counseling done  re healthy lifestyle involving  commitment to 150 minutes exercise per week, heart healthy diet, and attaining healthy weight.The importance of adequate sleep also discussed. Regular seat belt use and home safety, is also discussed. Changes in health habits are decided on by the patient with goals and time frames  set for achieving them. Immunization and cancer screening needs are specifically addressed at this visit.

## 2017-05-21 NOTE — Assessment & Plan Note (Signed)

## 2017-05-21 NOTE — Patient Instructions (Addendum)
F/u with MD 3rd week in August , call if you need me sooner  Wellness with Nurse July 10 or after    Fasting lipid, cmp and EGFr, hBA1C end March  It is important that you exercise regularly at least 30 minutes 5 times a week. If you develop chest pain, have severe difficulty breathing, or feel very tired, stop exercising immediately and seek medical attention  Please work on good  health habits so that your health will improve. 1. Commitment to daily physical activity for 30 to 60  minutes, if you are able to do this.  2. Commitment to wise food choices. Aim for half of your  food intake to be vegetable and fruit, one quarter starchy foods, and one quarter protein. Try to eat on a regular schedule  3 meals per day, snacking between meals should be limited to vegetables or fruits or small portions of nuts. 64 ounces of water per day is generally recommended, unless you have specific health conditions, like heart failure or kidney failure where you will need to limit fluid intake.  3. Commitment to sufficient and a  good quality of physical and mental rest daily, generally between 6 to 8 hours per day.  WITH PERSISTANCE AND PERSEVERANCE, THE IMPOSSIBLE , BECOMES THE NORM! ,ty1

## 2017-05-30 ENCOUNTER — Other Ambulatory Visit: Payer: Self-pay

## 2017-05-30 ENCOUNTER — Encounter (HOSPITAL_COMMUNITY): Payer: Self-pay

## 2017-05-30 ENCOUNTER — Inpatient Hospital Stay (HOSPITAL_COMMUNITY): Payer: Medicare HMO | Attending: Internal Medicine

## 2017-05-30 VITALS — BP 148/80 | HR 80 | Temp 98.3°F | Resp 18

## 2017-05-30 DIAGNOSIS — E538 Deficiency of other specified B group vitamins: Secondary | ICD-10-CM | POA: Insufficient documentation

## 2017-05-30 MED ORDER — CYANOCOBALAMIN 1000 MCG/ML IJ SOLN
1000.0000 ug | Freq: Once | INTRAMUSCULAR | Status: AC
  Administered 2017-05-30: 1000 ug via INTRAMUSCULAR

## 2017-05-30 MED ORDER — CYANOCOBALAMIN 1000 MCG/ML IJ SOLN
INTRAMUSCULAR | Status: AC
  Filled 2017-05-30: qty 1

## 2017-05-30 NOTE — Progress Notes (Signed)
Helen Mahoney presents today for injection per MD orders. B12 1000 mcg administered IM in right deltoid. Administration without incident. Patient tolerated well. Patient discharged ambulatory and in stable condition from clinic to self. Patient to follow up next month as scheduled.

## 2017-05-30 NOTE — Patient Instructions (Signed)
[facility] at [facility] Discharge Instructions  RECOMMENDATIONS MADE BY THE CONSULTANT AND ANY TEST RESULTS WILL BE SENT TO YOUR REFERRING PHYSICIAN.  You had your B-12 injection today Follow up as scheduled.  Thank you for choosing [facility] at [facility] to provide your oncology and hematology care.  To afford each patient quality time with our provider, please arrive at least 15 minutes before your scheduled appointment time.    If you have a lab appointment with the Cancer Center please come in thru the  Main Entrance and check in at the main information desk  You need to re-schedule your appointment should you arrive 10 or more minutes late.  We strive to give you quality time with our providers, and arriving late affects you and other patients whose appointments are after yours.  Also, if you no show three or more times for appointments you may be dismissed from the clinic at the providers discretion.     Again, thank you for choosing [facility].  Our hope is that these requests will decrease the amount of time that you wait before being seen by our physicians.       _____________________________________________________________  Should you have questions after your visit to [facility], please contact our office at [phone] between the hours of 8:30 a.m. and 4:30 p.m.  Voicemails left after 4:30 p.m. will not be returned until the following business day.  For prescription refill requests, have your pharmacy contact our office.       Resources For Cancer Patients and their Caregivers ? American Cancer Society: Can assist with transportation, wigs, general needs, runs Look Good Feel Better.        1-888-227-6333 ? Cancer Care: Provides financial assistance, online support groups, medication/co-pay assistance.  1-800-813-HOPE (4673) ? [facility] Assists [place] cancer  patients and their families through emotional , educational and financial support.  336-427-4357 ? [place] DSS Where to apply for food stamps, Medicaid and utility assistance. 336-342-1394 ? [employer]: Transportation to medical appointments. 336-347-2287 ? Social Security Administration: May apply for disability if have a Stage IV cancer. 336-342-7796 1-800-772-1213 ? [place] Aging, Disability and Transit Services: Assists with nutrition, care and transit needs. 336-349-2343  Cancer Center Support Programs: @[email or web address]@ > Cancer Support Group  2nd Tuesday of the month 1pm-2pm, Journey Room  > Creative Journey  3rd Tuesday of the month 1130am-1pm, Journey Room  > Look Good Feel Better  1st Wednesday of the month 10am-12 noon, Journey Room (Call American Cancer Society to register 1-800-395-5775)    

## 2017-06-27 ENCOUNTER — Inpatient Hospital Stay (HOSPITAL_COMMUNITY): Payer: Medicare HMO | Attending: Internal Medicine

## 2017-06-27 ENCOUNTER — Encounter (HOSPITAL_COMMUNITY): Payer: Self-pay

## 2017-06-27 VITALS — BP 155/82 | HR 88 | Temp 98.5°F | Resp 18

## 2017-06-27 DIAGNOSIS — E538 Deficiency of other specified B group vitamins: Secondary | ICD-10-CM | POA: Insufficient documentation

## 2017-06-27 MED ORDER — CYANOCOBALAMIN 1000 MCG/ML IJ SOLN
1000.0000 ug | Freq: Once | INTRAMUSCULAR | Status: AC
  Administered 2017-06-27: 1000 ug via INTRAMUSCULAR
  Filled 2017-06-27: qty 1

## 2017-06-27 NOTE — Progress Notes (Signed)
Helen Mahoney tolerated Vit B12 injection well without complaints or issues. VSS Pt discharged self ambulatory in satisfactory condition

## 2017-06-27 NOTE — Patient Instructions (Signed)
[facility] at [facility] Discharge Instructions  Received Vit B12 injection today. Follow-up as scheduled. Call clinic for any questions or concerns   Thank you for choosing [facility] at [facility] to provide your oncology and hematology care.  To afford each patient quality time with our provider, please arrive at least 15 minutes before your scheduled appointment time.   If you have a lab appointment with the Cancer Center please come in thru the  Main Entrance and check in at the main information desk  You need to re-schedule your appointment should you arrive 10 or more minutes late.  We strive to give you quality time with our providers, and arriving late affects you and other patients whose appointments are after yours.  Also, if you no show three or more times for appointments you may be dismissed from the clinic at the providers discretion.     Again, thank you for choosing [facility].  Our hope is that these requests will decrease the amount of time that you wait before being seen by our physicians.       _____________________________________________________________  Should you have questions after your visit to [facility], please contact our office at [phone] between the hours of 8:30 a.m. and 4:30 p.m.  Voicemails left after 4:30 p.m. will not be returned until the following business day.  For prescription refill requests, have your pharmacy contact our office.       Resources For Cancer Patients and their Caregivers ? American Cancer Society: Can assist with transportation, wigs, general needs, runs Look Good Feel Better.        1-888-227-6333 ? Cancer Care: Provides financial assistance, online support groups, medication/co-pay assistance.  1-800-813-HOPE (4673) ? [facility] Assists [place] cancer patients and their families through emotional , educational and  financial support.  336-427-4357 ? [place] DSS Where to apply for food stamps, Medicaid and utility assistance. 336-342-1394 ? [facility]: Transportation to medical appointments. 336-347-2287 ? Social Security Administration: May apply for disability if have a Stage IV cancer. 336-342-7796 1-800-772-1213 ? [place] Aging, Disability and Transit Services: Assists with nutrition, care and transit needs. 336-349-2343  Cancer Center Support Programs:   > Cancer Support Group  2nd Tuesday of the month 1pm-2pm, Journey Room   > Creative Journey  3rd Tuesday of the month 1130am-1pm, Journey Room    

## 2017-07-11 ENCOUNTER — Other Ambulatory Visit: Payer: Self-pay | Admitting: Family Medicine

## 2017-07-30 ENCOUNTER — Inpatient Hospital Stay (HOSPITAL_COMMUNITY): Payer: Medicare HMO | Attending: Internal Medicine

## 2017-07-30 ENCOUNTER — Encounter (HOSPITAL_COMMUNITY): Payer: Self-pay

## 2017-07-30 ENCOUNTER — Other Ambulatory Visit: Payer: Self-pay

## 2017-07-30 VITALS — BP 131/80 | HR 89 | Temp 98.1°F | Resp 18

## 2017-07-30 DIAGNOSIS — D508 Other iron deficiency anemias: Secondary | ICD-10-CM | POA: Insufficient documentation

## 2017-07-30 DIAGNOSIS — E538 Deficiency of other specified B group vitamins: Secondary | ICD-10-CM | POA: Diagnosis not present

## 2017-07-30 MED ORDER — CYANOCOBALAMIN 1000 MCG/ML IJ SOLN
1000.0000 ug | Freq: Once | INTRAMUSCULAR | Status: AC
  Administered 2017-07-30: 1000 ug via INTRAMUSCULAR
  Filled 2017-07-30: qty 1

## 2017-07-30 MED ORDER — CYANOCOBALAMIN 1000 MCG/ML IJ SOLN
INTRAMUSCULAR | Status: AC
  Filled 2017-07-30: qty 1

## 2017-07-30 NOTE — Patient Instructions (Signed)
[place] at [facility] Discharge Instructions b12 as scheduled today Follow up as scheduled   Thank you for choosing [place] at [facility] to provide your oncology and hematology care.  To afford each patient quality time with our provider, please arrive at least 15 minutes before your scheduled appointment time.   If you have a lab appointment with the [facility] please come in thru the  Main Entrance and check in at the main information desk  You need to re-schedule your appointment should you arrive 10 or more minutes late.  We strive to give you quality time with our providers, and arriving late affects you and other patients whose appointments are after yours.  Also, if you no show three or more times for appointments you may be dismissed from the clinic at the providers discretion.     Again, thank you for choosing [facility].  Our hope is that these requests will decrease the amount of time that you wait before being seen by our physicians.       _____________________________________________________________  Should you have questions after your visit to [facility], please contact our office at [phone] between the hours of 8:30 a.m. and 4:30 p.m.  Voicemails left after 4:30 p.m. will not be returned until the following business day.  For prescription refill requests, have your pharmacy contact our office.       Resources For Cancer Patients and their Caregivers ? American Cancer Society: Can assist with transportation, wigs, general needs, runs Look Good Feel Better.        (702)363-2590 ? Cancer Care: Provides financial assistance, online support groups, medication/co-pay assistance.  1-800-813-HOPE (445)608-6254) ? [place] Assists [place] cancer patients and their families through emotional , educational and financial support.  (579) 490-6398 ? [place]  DSS Where to apply for food stamps, Medicaid and utility assistance. 502 527 9852 ? [employer]: Transportation to medical appointments. 279 427 5365 ? Social Security Administration: May apply for disability if have a Stage IV cancer. (323)599-5870 (215) 808-6917 ? [employer], Disability and Transit Services: Assists with nutrition, care and transit needs. [place] Support Programs:   > Cancer Support Group  2nd Tuesday of the month 1pm-2pm, Journey Room   > Creative Journey  3rd Tuesday of the month 1130am-1pm, Journey Room

## 2017-07-30 NOTE — Progress Notes (Signed)
Helen Mahoney presents today for injection per the provider's orders.  B12 administration without incident; see MAR for injection details.  Patient tolerated procedure well and without incident.  No questions or complaints noted at this time.  Discharged ambulatory.  

## 2017-08-01 ENCOUNTER — Other Ambulatory Visit: Payer: Self-pay | Admitting: Family Medicine

## 2017-08-21 ENCOUNTER — Other Ambulatory Visit (HOSPITAL_COMMUNITY): Payer: Self-pay

## 2017-08-21 DIAGNOSIS — D509 Iron deficiency anemia, unspecified: Secondary | ICD-10-CM

## 2017-08-22 ENCOUNTER — Inpatient Hospital Stay (HOSPITAL_COMMUNITY): Payer: Medicare HMO

## 2017-08-22 DIAGNOSIS — D509 Iron deficiency anemia, unspecified: Secondary | ICD-10-CM

## 2017-08-22 DIAGNOSIS — E538 Deficiency of other specified B group vitamins: Secondary | ICD-10-CM | POA: Diagnosis not present

## 2017-08-22 DIAGNOSIS — D508 Other iron deficiency anemias: Secondary | ICD-10-CM | POA: Diagnosis not present

## 2017-08-22 LAB — CBC WITH DIFFERENTIAL/PLATELET
BASOS PCT: 0 %
Basophils Absolute: 0 10*3/uL (ref 0.0–0.1)
EOS ABS: 0.3 10*3/uL (ref 0.0–0.7)
Eosinophils Relative: 4 %
HCT: 35.7 % — ABNORMAL LOW (ref 36.0–46.0)
HEMOGLOBIN: 10.4 g/dL — AB (ref 12.0–15.0)
LYMPHS ABS: 1.9 10*3/uL (ref 0.7–4.0)
Lymphocytes Relative: 24 %
MCH: 20.5 pg — ABNORMAL LOW (ref 26.0–34.0)
MCHC: 29.1 g/dL — ABNORMAL LOW (ref 30.0–36.0)
MCV: 70.3 fL — ABNORMAL LOW (ref 78.0–100.0)
Monocytes Absolute: 0.4 10*3/uL (ref 0.1–1.0)
Monocytes Relative: 6 %
NEUTROS PCT: 66 %
Neutro Abs: 5.2 10*3/uL (ref 1.7–7.7)
Platelets: 311 10*3/uL (ref 150–400)
RBC: 5.08 MIL/uL (ref 3.87–5.11)
RDW: 16.1 % — ABNORMAL HIGH (ref 11.5–15.5)
WBC: 7.9 10*3/uL (ref 4.0–10.5)

## 2017-08-22 LAB — COMPREHENSIVE METABOLIC PANEL
ALBUMIN: 3.4 g/dL — AB (ref 3.5–5.0)
ALK PHOS: 120 U/L (ref 38–126)
ALT: 20 U/L (ref 14–54)
AST: 22 U/L (ref 15–41)
Anion gap: 15 (ref 5–15)
BUN: 16 mg/dL (ref 6–20)
CALCIUM: 9.1 mg/dL (ref 8.9–10.3)
CO2: 22 mmol/L (ref 22–32)
CREATININE: 0.96 mg/dL (ref 0.44–1.00)
Chloride: 98 mmol/L — ABNORMAL LOW (ref 101–111)
GFR calc Af Amer: >60 mL/min (ref >60)
GFR calc non Af Amer: 60 mL/min — ABNORMAL LOW (ref >60)
GLUCOSE: 264 mg/dL — AB (ref 65–99)
Potassium: 4.4 mmol/L (ref 3.5–5.1)
SODIUM: 135 mmol/L (ref 135–145)
Total Bilirubin: 0.6 mg/dL (ref 0.3–1.2)
Total Protein: 7.2 g/dL (ref 6.5–8.1)

## 2017-08-22 LAB — IRON AND TIBC
Iron: 44 ug/dL (ref 28–170)
SATURATION RATIOS: 15 % (ref 10.4–31.8)
TIBC: 293 ug/dL (ref 250–450)
UIBC: 249 ug/dL

## 2017-08-22 LAB — LACTATE DEHYDROGENASE: LDH: 141 U/L (ref 98–192)

## 2017-08-22 LAB — FERRITIN: Ferritin: 90 ng/mL (ref 11–307)

## 2017-08-22 LAB — VITAMIN B12: Vitamin B-12: 731 pg/mL (ref 180–914)

## 2017-08-29 ENCOUNTER — Other Ambulatory Visit: Payer: Self-pay

## 2017-08-29 ENCOUNTER — Inpatient Hospital Stay (HOSPITAL_COMMUNITY): Payer: Medicare HMO | Attending: Internal Medicine | Admitting: Internal Medicine

## 2017-08-29 ENCOUNTER — Inpatient Hospital Stay (HOSPITAL_COMMUNITY): Payer: Medicare HMO

## 2017-08-29 ENCOUNTER — Encounter (HOSPITAL_COMMUNITY): Payer: Self-pay | Admitting: Internal Medicine

## 2017-08-29 VITALS — BP 144/76 | HR 93 | Temp 98.1°F | Resp 16 | Wt 250.0 lb

## 2017-08-29 DIAGNOSIS — Z79899 Other long term (current) drug therapy: Secondary | ICD-10-CM | POA: Insufficient documentation

## 2017-08-29 DIAGNOSIS — Z800 Family history of malignant neoplasm of digestive organs: Secondary | ICD-10-CM | POA: Insufficient documentation

## 2017-08-29 DIAGNOSIS — D508 Other iron deficiency anemias: Secondary | ICD-10-CM

## 2017-08-29 DIAGNOSIS — Z794 Long term (current) use of insulin: Secondary | ICD-10-CM | POA: Insufficient documentation

## 2017-08-29 DIAGNOSIS — D509 Iron deficiency anemia, unspecified: Secondary | ICD-10-CM | POA: Diagnosis not present

## 2017-08-29 DIAGNOSIS — Z7982 Long term (current) use of aspirin: Secondary | ICD-10-CM | POA: Diagnosis not present

## 2017-08-29 DIAGNOSIS — D563 Thalassemia minor: Secondary | ICD-10-CM | POA: Diagnosis not present

## 2017-08-29 DIAGNOSIS — E538 Deficiency of other specified B group vitamins: Secondary | ICD-10-CM | POA: Diagnosis not present

## 2017-08-29 DIAGNOSIS — E119 Type 2 diabetes mellitus without complications: Secondary | ICD-10-CM | POA: Insufficient documentation

## 2017-08-29 DIAGNOSIS — I10 Essential (primary) hypertension: Secondary | ICD-10-CM | POA: Diagnosis not present

## 2017-08-29 DIAGNOSIS — Z7984 Long term (current) use of oral hypoglycemic drugs: Secondary | ICD-10-CM | POA: Diagnosis not present

## 2017-08-29 MED ORDER — CYANOCOBALAMIN 1000 MCG/ML IJ SOLN
1000.0000 ug | Freq: Once | INTRAMUSCULAR | Status: AC
  Administered 2017-08-29: 1000 ug via INTRAMUSCULAR
  Filled 2017-08-29: qty 1

## 2017-08-29 NOTE — Progress Notes (Signed)
Diagnosis Other iron deficiency anemia - Plan: CBC with Differential/Platelet, Comprehensive metabolic panel, Lactate dehydrogenase, Ferritin  Staging Cancer Staging No matching staging information was found for the patient.  Assessment and Plan:  1.  Microcytic Anemia longstanding dating back to 2010.  Labs done 08/22/2017 show white count 7.9  hemoglobin 10.4  platelets 311,000.  Ferritin is 90.  She has undergone colonoscopy 05/15/2015 with redundant L colon, diverticulosis, small internal hemorrhoids.  EGD 05/14/2014 with gastric polyps, GE junction stricture.  Givens Capsule 06/02/2015 OCCASIONAL EROSION IN DUODENUM No masses, ULCERS, or AVMs SEEN. OCCASIONAL LYMPHANGECTASIA.  She should follow-up with GI as directed.  She will return to clinic in 6 months for repeat labs.  2.  B12 deficiency.  Hemoglobin 10.4.  B12 level is improved at 731.  She will receive B12 today and will continue with monthly B12 and will have repeat labs in 6 months.  3.  Alpha thalassemia trait.  She had alpha thalassemia genotype study done 08/17/2015.  This is also an explanation of persistent microcytosis.  MCV 70 on labs done today.  4.  Hypertension.  Blood pressure 144/76.  Follow-up with PCP.  5.  Health maintenance.  Continue screening mammogram is recommended.   Current Status: Patient is seen today for follow-up.  She is here to go over labs and for B12 injection.  Problem List Patient Active Problem List   Diagnosis Date Noted  . Osteoarthritis of right knee [M17.11] 07/04/2015  . B12 deficiency [E53.8] 06/16/2015  . Alpha-0- thalassemia trait/carrier [D56.3] 05/18/2015  . FH: colon cancer [Z80.0] 04/25/2015  . Annual physical exam [U98.11] 04/08/2015  . Vitamin D deficiency [E55.9] 10/18/2014  . IDA (iron deficiency anemia) [D50.9] 10/18/2014  . Diabetes mellitus, insulin dependent (IDDM), uncontrolled ([place]) [E10.65] 08/20/2007  . Hyperlipidemia LDL goal <100 [E78.5] 08/20/2007  . Morbid obesity  ([place]) [E66.01] 08/20/2007  . Essential hypertension [I10] 08/20/2007    Past Medical History Past Medical History:  Diagnosis Date  . 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 B12 deficiency 06/16/2015  . Colon polyps   . Diabetes mellitus, type 2 ([place])   . Hyperlipidemia   . Hypertension   . Microcytic anemia 05/18/2015   2013: TCS/EGD 2017: TCS/EGD HYPERPLASTIC GASTRIC POLYPS, LYMPHOCYTIC GASTRITIS   . Obesity     Past Surgical History Past Surgical History:  Procedure Laterality Date  . bilateral tubal ligation  1979  . CHOLECYSTECTOMY  2001   ACUTE CHOLECYSTITIS/GALLSTONES  . COLONOSCOPY  05/14/2011   Dr. Oneida Alar: sessile polyp in sigmoid colon, internal hemorrhoids, hyerplastic polyps  . COLONOSCOPY N/A 05/15/2015   Procedure: COLONOSCOPY;  Surgeon: Danie Binder, MD;  [facility];  Service: Endoscopy;  Laterality: N/A;  0830  . COLONOSCOPY W/ POLYPECTOMY  NOV 2008 MJ ANEMIA   POLYP NO RETRIEVED  . COLONOSCOPY W/ POLYPECTOMY  2006 DR. SMTH   POLYP?  . ESOPHAGOGASTRODUODENOSCOPY  05/14/2011   Dr. Oneida Alar: sessile polyps in the cardia, mild gastritis. Chronic duodenitis consistent with peptic duodenitis, chronic active H.pylori gastritis.   Marland Kitchen ESOPHAGOGASTRODUODENOSCOPY N/A 05/15/2015   Procedure: ESOPHAGOGASTRODUODENOSCOPY (EGD);  Surgeon: Danie Binder, MD;  [facility];  Service: Endoscopy;  Laterality: N/A;  . GIVENS CAPSULE STUDY N/A 06/02/2015   Procedure: GIVENS CAPSULE STUDY;  Surgeon: Danie Binder, MD;  [facility];  Service: Endoscopy;  Laterality: N/A;  0700  . TUBAL LIGATION    . UPPER  GASTROINTESTINAL ENDOSCOPY  NOV 2008 MJ ANEMIA   NL EXAM, urease neg    Family History Family History  Problem Relation Age of Onset  . Heart disease Mother   . Diabetes Mother   . Hypertension Mother   . Stroke Mother   . Breast cancer Mother   .  Diabetes Father   . Heart attack Father   . Kidney disease Father   . Hypertension Sister   . Diabetes Sister   . Diabetes Sister   . Mental illness Sister   . Hypertension Sister   . Hepatitis C Sister   . Mental illness Sister   . Diabetes Sister   . Hypertension Sister   . Mental illness Sister   . Colon cancer Sister 37  . Diabetes Brother      Social History  reports that she has never smoked. She has never used smokeless tobacco. She reports that she does not drink alcohol or use drugs.  Medications  Current Outpatient Medications:  .  aspirin 81 MG tablet, Take 81 mg by mouth daily., Disp: , Rfl:  .  Calcium Citrate-Vitamin D (CITRACAL PETITES/VITAMIN D) 200-250 MG-UNIT TABS, Take 1 tablet by mouth daily. , Disp: , Rfl:  .  Cholecalciferol (VITAMIN D3) 2000 units TABS, Take 1 tablet by mouth daily., Disp: , Rfl:  .  Ferrous Sulfate (IRON) 325 (65 FE) MG TABS, Take 1 tablet by mouth daily., Disp: 100 each, Rfl: 2 .  glipiZIDE (GLUCOTROL) 10 MG tablet, TAKE 1 TABLET TWICE DAILY BEFORE MEALS, Disp: 180 tablet, Rfl: 1 .  metFORMIN (GLUCOPHAGE) 1000 MG tablet, TAKE 1 TABLET TWICE DAILY WITH MEALS, Disp: 180 tablet, Rfl: 1 .  Multiple Vitamin (MULTIVITAMINS PO), Take 1 tablet by mouth daily. , Disp: , Rfl:  .  pravastatin (PRAVACHOL) 80 MG tablet, TAKE 1 TABLET ONE TIME DAILY IN THE EVENING, Disp: 90 tablet, Rfl: 2 .  RELION PEN NEEDLE 31G/8MM 31G X 8 MM MISC, USE ONE  ONCE DAILY AS DIRECTED, Disp: 50 each, Rfl: 5 .  TRUE METRIX BLOOD GLUCOSE TEST test strip, TEST THREE TIMES DAILY, Disp: 100 each, Rfl: 5 .  Insulin Glargine (LANTUS SOLOSTAR) 100 UNIT/ML Solostar Pen, Inject 50 Units into the skin daily at 10 pm., Disp: 45 mL, Rfl: 1  Allergies Tetanus toxoids and Penicillins  Review of Systems Review of Systems - Oncology ROS as per HPI otherwise 12 point ROS is negative.   Physical Exam  Vitals temperature 98 heart rate 93 respirations 16 blood pressure 144/76 pulse ox  97% on room air. Wt Readings from Last 3 Encounters:  05/21/17 246 lb (111.6 kg)  03/11/17 248 lb (112.5 kg)  02/27/17 253 lb (114.8 kg)   Temp Readings from Last 3 Encounters:  07/30/17 98.1 F (36.7 C) (Oral)  06/27/17 98.5 F (36.9 C) (Oral)  05/30/17 98.3 F (36.8 C) (Oral)   BP Readings from Last 3 Encounters:  07/30/17 131/80  06/27/17 (!) 155/82  05/30/17 (!) 148/80   Pulse Readings from Last 3 Encounters:  07/30/17 89  06/27/17 88  05/30/17 80   Constitutional: Well-developed, well-nourished, and in no distress.   HENT: Head: Normocephalic and atraumatic.  Mouth/Throat: No oropharyngeal exudate. Mucosa moist. Eyes: Pupils are equal, round, and reactive to light. Conjunctivae are normal. No scleral icterus.  Neck: Normal range of motion. Neck supple. No JVD present.  Cardiovascular: Normal rate, regular rhythm and normal heart sounds.  Exam reveals no gallop and no friction rub.   No  murmur heard. Pulmonary/Chest: Effort normal and breath sounds normal. No respiratory distress. No wheezes.No rales.  Abdominal: Soft. Bowel sounds are normal. No distension. There is no tenderness. There is no guarding.  Musculoskeletal: No edema or tenderness.  Lymphadenopathy: No cervical, axillary or supraclavicular adenopathy.  Neurological: Alert and oriented to person, place, and time. No cranial nerve deficit.  Skin: Skin is warm and dry. No rash noted. No erythema. No pallor.  Psychiatric: Affect and judgment normal.   Labs No visits with results within 3 Day(s) from this visit.  Latest known visit with results is:  Appointment on 08/22/2017  Component Date Value Ref Range Status  . Ferritin 08/22/2017 90  11 - 307 ng/mL Final   Performed at [facility] [address] [place], [place] [address]  . Iron 08/22/2017 44  28 - 170 ug/dL Final  . TIBC 08/22/2017 293  250 - 450 ug/dL Final  . Saturation Ratios 08/22/2017 15  10.4 - 31.8 % Final  . UIBC 08/22/2017 249   ug/dL Final   Performed at [facility], [place] [address], [place], [place] [address]  . Vitamin B-12 08/22/2017 731  180 - 914 pg/mL Final   Comment: (NOTE) This assay is not validated for testing neonatal or myeloproliferative syndrome specimens for Vitamin B12 levels. Performed at [facility], [place] [address] [place], [place] [address]   . WBC 08/22/2017 7.9  4.0 - 10.5 K/uL Final  . RBC 08/22/2017 5.08  3.87 - 5.11 MIL/uL Final  . Hemoglobin 08/22/2017 10.4* 12.0 - 15.0 g/dL Final  . HCT 08/22/2017 35.7* 36.0 - 46.0 % Final  . MCV 08/22/2017 70.3* 78.0 - 100.0 fL Final  . MCH 08/22/2017 20.5* 26.0 - 34.0 pg Final  . MCHC 08/22/2017 29.1* 30.0 - 36.0 g/dL Final  . RDW 08/22/2017 16.1* 11.5 - 15.5 % Final  . Platelets 08/22/2017 311  150 - 400 K/uL Final  . Neutrophils Relative % 08/22/2017 66  % Final  . Neutro Abs 08/22/2017 5.2  1.7 - 7.7 K/uL Final  . Lymphocytes Relative 08/22/2017 24  % Final  . Lymphs Abs 08/22/2017 1.9  0.7 - 4.0 K/uL Final  . Monocytes Relative 08/22/2017 6  % Final  . Monocytes Absolute 08/22/2017 0.4  0.1 - 1.0 K/uL Final  . Eosinophils Relative 08/22/2017 4  % Final  . Eosinophils Absolute 08/22/2017 0.3  0.0 - 0.7 K/uL Final  . Basophils Relative 08/22/2017 0  % Final  . Basophils Absolute 08/22/2017 0.0  0.0 - 0.1 K/uL Final   Performed at [facility], [address], [place], [place] [address]  . Sodium 08/22/2017 135  135 - 145 mmol/L Final  . Potassium 08/22/2017 4.4  3.5 - 5.1 mmol/L Final  . Chloride 08/22/2017 98* 101 - 111 mmol/L Final  . CO2 08/22/2017 22  22 - 32 mmol/L Final  . Glucose, Bld 08/22/2017 264* 65 - 99 mg/dL Final  . BUN 08/22/2017 16  6 - 20 mg/dL Final  . Creatinine, Ser 08/22/2017 0.96  0.44 - 1.00 mg/dL Final  . Calcium 08/22/2017 9.1  8.9 - 10.3 mg/dL Final  . Total Protein 08/22/2017 7.2  6.5 - 8.1 g/dL Final  . Albumin 08/22/2017 3.4* 3.5 - 5.0 g/dL Final  . AST 08/22/2017 22  15 - 41 U/L Final  . ALT  08/22/2017 20  14 - 54 U/L Final  . Alkaline Phosphatase 08/22/2017 120  38 - 126 U/L Final  .  Total Bilirubin 08/22/2017 0.6  0.3 - 1.2 mg/dL Final  . GFR calc non Af Amer 08/22/2017 60* >60 mL/min Final  . GFR calc Af Amer 08/22/2017 >60  >60 mL/min Final   Comment: (NOTE) The eGFR has been calculated using the CKD EPI equation. This calculation has not been validated in all clinical situations. eGFR's persistently <60 mL/min signify possible Chronic Kidney Disease.   Georgiann Hahn gap 08/22/2017 15  5 - 15 Final   Performed at [facility], [address], [place], [place] [address]  . LDH 08/22/2017 141  98 - 192 U/L Final   Performed at [facility], [address] [place], [place] [address]     Pathology Orders Placed This Encounter  Procedures  . CBC with Differential/Platelet    Standing Status:   Future    Standing Expiration Date:   08/30/2018  . Comprehensive metabolic panel    Standing Status:   Future    Standing Expiration Date:   08/30/2018  . Lactate dehydrogenase    Standing Status:   Future    Standing Expiration Date:   08/30/2018  . Ferritin    Standing Status:   Future    Standing Expiration Date:   08/30/2018       Zoila Shutter MD

## 2017-08-29 NOTE — Progress Notes (Signed)
Pt here today for B12 injection. Pt given injection in her right deltoid. Pt tolerated well. Pt stable and discharged home ambulatory. Pt to continue monthly B12 injections.

## 2017-09-01 ENCOUNTER — Telehealth: Payer: Self-pay | Admitting: Family Medicine

## 2017-09-01 DIAGNOSIS — D508 Other iron deficiency anemias: Secondary | ICD-10-CM

## 2017-09-01 MED ORDER — ALPRAZOLAM 0.25 MG PO TABS: ORAL_TABLET | ORAL | 0 refills | Status: DC

## 2017-09-01 NOTE — Telephone Encounter (Signed)
Labs ordered in [place] , none done since November and blood sugar uncontrolled. She needs those same labs done for an appt last week in May or early June which needs to be scheduled with me please Please explain and let her know  Also let her know I  Am sorry for recent loss and have sent in a limited supply of xanax to uise sparingly, sent to [facility] which is her pharmacy on file

## 2017-09-05 NOTE — Telephone Encounter (Signed)
Pt aware.

## 2017-09-09 ENCOUNTER — Other Ambulatory Visit: Payer: Self-pay | Admitting: Family Medicine

## 2017-09-09 DIAGNOSIS — E1065 Type 1 diabetes mellitus with hyperglycemia: Secondary | ICD-10-CM | POA: Diagnosis not present

## 2017-09-09 DIAGNOSIS — E785 Hyperlipidemia, unspecified: Secondary | ICD-10-CM | POA: Diagnosis not present

## 2017-09-10 LAB — LIPID PANEL
CHOL/HDL RATIO: 3.6 (calc) (ref <5.0)
Cholesterol: 169 mg/dL (ref <200)
HDL: 47 mg/dL — ABNORMAL LOW (ref >50)
LDL Cholesterol (Calc): 103 mg/dL (calc) — ABNORMAL HIGH
NON-HDL CHOLESTEROL (CALC): 122 mg/dL (ref <130)
Triglycerides: 97 mg/dL (ref <150)

## 2017-09-10 LAB — COMPLETE METABOLIC PANEL WITH GFR
AG Ratio: 1.2 (calc) (ref 1.0–2.5)
ALT: 17 U/L (ref 6–29)
AST: 19 U/L (ref 10–35)
Albumin: 3.7 g/dL (ref 3.6–5.1)
Alkaline phosphatase (APISO): 128 U/L (ref 33–130)
BUN: 13 mg/dL (ref 7–25)
CALCIUM: 9.1 mg/dL (ref 8.6–10.4)
CO2: 30 mmol/L (ref 20–32)
CREATININE: 0.89 mg/dL (ref 0.50–0.99)
Chloride: 103 mmol/L (ref 98–110)
GFR, EST NON AFRICAN AMERICAN: 67 mL/min/{1.73_m2} (ref >=60)
GFR, Est African American: 78 mL/min/{1.73_m2} (ref >=60)
Globulin: 3 g/dL (calc) (ref 1.9–3.7)
Glucose, Bld: 68 mg/dL (ref 65–99)
Potassium: 4.1 mmol/L (ref 3.5–5.3)
Sodium: 141 mmol/L (ref 135–146)
Total Bilirubin: 0.4 mg/dL (ref 0.2–1.2)
Total Protein: 6.7 g/dL (ref 6.1–8.1)

## 2017-09-10 LAB — HEMOGLOBIN A1C
Hgb A1c MFr Bld: 8.6 % of total Hgb — ABNORMAL HIGH (ref <5.7)
Mean Plasma Glucose: 200 (calc)
eAG (mmol/L): 11.1 (calc)

## 2017-09-29 ENCOUNTER — Encounter (HOSPITAL_COMMUNITY): Payer: Self-pay

## 2017-09-29 ENCOUNTER — Inpatient Hospital Stay (HOSPITAL_COMMUNITY): Payer: Medicare HMO | Attending: Internal Medicine

## 2017-09-29 VITALS — BP 140/79 | HR 98 | Resp 16

## 2017-09-29 DIAGNOSIS — D509 Iron deficiency anemia, unspecified: Secondary | ICD-10-CM | POA: Diagnosis not present

## 2017-09-29 DIAGNOSIS — E538 Deficiency of other specified B group vitamins: Secondary | ICD-10-CM | POA: Diagnosis not present

## 2017-09-29 MED ORDER — CYANOCOBALAMIN 1000 MCG/ML IJ SOLN
1000.0000 ug | Freq: Once | INTRAMUSCULAR | Status: AC
  Administered 2017-09-29: 1000 ug via INTRAMUSCULAR
  Filled 2017-09-29: qty 1

## 2017-09-29 NOTE — Patient Instructions (Signed)
[facility] at [facility]  Discharge Instructions:  You received a b12 shot today.  _______________________________________________________________  Thank you for choosing [facility] at [facility] to provide your oncology and hematology care.  To afford each patient quality time with our providers, please arrive at least 15 minutes before your scheduled appointment.  You need to re-schedule your appointment if you arrive 10 or more minutes late.  We strive to give you quality time with our providers, and arriving late affects you and other patients whose appointments are after yours.  Also, if you no show three or more times for appointments you may be dismissed from the clinic.  Again, thank you for choosing [facility] at [facility]. Our hope is that these requests will allow you access to exceptional care and in a timely manner. _______________________________________________________________  If you have questions after your visit, please contact our office at [phone] between the hours of 8:30 a.m. and 5:00 p.m. Voicemails left after 4:30 p.m. will not be returned until the following business day. _______________________________________________________________  For prescription refill requests, have your pharmacy contact our office. _______________________________________________________________  Recommendations made by the consultant and any test results will be sent to your referring physician. _______________________________________________________________ 

## 2017-09-29 NOTE — Progress Notes (Signed)
Patient tolerated b12 shot with no complaints voiced.  Injection site clean and dry with no bruising or swelling noted at site.  Band aid applied.  VSS with discharge and left ambulatory with no s/s of distress noted.   

## 2017-10-06 ENCOUNTER — Ambulatory Visit (INDEPENDENT_AMBULATORY_CARE_PROVIDER_SITE_OTHER): Payer: Medicare HMO | Admitting: Family Medicine

## 2017-10-06 ENCOUNTER — Encounter: Payer: Self-pay | Admitting: Family Medicine

## 2017-10-06 VITALS — BP 130/80 | HR 73 | Resp 16 | Ht 65.0 in | Wt 248.0 lb

## 2017-10-06 DIAGNOSIS — E10649 Type 1 diabetes mellitus with hypoglycemia without coma: Secondary | ICD-10-CM

## 2017-10-06 DIAGNOSIS — I10 Essential (primary) hypertension: Secondary | ICD-10-CM | POA: Diagnosis not present

## 2017-10-06 LAB — GLUCOSE, POCT (MANUAL RESULT ENTRY): POC Glucose: 131 mg/dl — AB (ref 70–99)

## 2017-10-06 NOTE — Patient Instructions (Addendum)
Fd/U in mid July, call if you need me before, schedule same day as wellness visit with nurse  Test and record 3 times daily  Start exercise daily  You are referred to diabetic educator  Maximum of 45 gm carbohydrate  Per meal

## 2017-10-10 ENCOUNTER — Encounter: Payer: Self-pay | Admitting: Family Medicine

## 2017-10-10 NOTE — Assessment & Plan Note (Signed)
Controlled, no change in medication DASH diet and commitment to daily physical activity for a minimum of 30 minutes discussed and encouraged, as a part of hypertension management. The importance of attaining a healthy weight is also discussed.  BP/Weight 10/06/2017 09/29/2017 08/29/2017 07/30/2017 06/27/2017 05/30/2017 09/23/7822  Systolic BP 235 361 443 154 008 676 195  Diastolic BP 80 79 76 80 82 80 80  Wt. (Lbs) 248 - 250 - - - 246  BMI 41.27 - 41.6 - - - 40.94

## 2017-10-10 NOTE — Assessment & Plan Note (Signed)
Markedly deteriorated due to non compliance stemming from poor nsight into gravity of the illness, education in ofice for 5 minutes, referred to diabetic educator and she wil go Helen Mahoney is reminded of the importance of commitment to daily physical activity for 30 minutes or more, as able and the need to limit carbohydrate intake to 30 to 60 grams per meal to help with blood sugar control.   The need to take medication as prescribed, test blood sugar as directed, and to call between visits if there is a concern that blood sugar is uncontrolled is also discussed.   Helen Mahoney is reminded of the importance of daily foot exam, annual eye examination, and good blood sugar, blood pressure and cholesterol control.  Diabetic Labs Latest Ref Rng & Units 09/09/2017 08/22/2017 03/13/2017 03/04/2017 02/27/2017  HbA1c <5.7 % of total Hgb 8.6(H) - - 7.3(H) -  Microalbumin Not Estab. ug/mL - - 40.9(H) - -  Micro/Creat Ratio 0.0 - 30.0 mg/g creat - - 16.1 - -  Chol <200 mg/dL 169 - - 177 -  HDL >50 mg/dL 47(L) - - 53 -  Calc LDL mg/dL (calc) 103(H) - - 105(H) -  Triglycerides <150 mg/dL 97 - - 97 -  Creatinine 0.50 - 0.99 mg/dL 0.89 0.96 - - 0.96   BP/Weight 10/06/2017 09/29/2017 08/29/2017 07/30/2017 06/27/2017 05/30/2017 05/14/5788  Systolic BP 383 338 329 191 660 600 459  Diastolic BP 80 79 76 80 82 80 80  Wt. (Lbs) 248 - 250 - - - 246  BMI 41.27 - 41.6 - - - 40.94   Foot/eye exam completion dates Latest Ref Rng & Units 05/21/2017 12/11/2016  Eye Exam No Retinopathy - No Retinopathy  Foot exam Order - - -  Foot Form Completion - Done -       F/u with log in 5 weeks

## 2017-10-10 NOTE — Progress Notes (Signed)
Helen Mahoney     MRN: [id number]      DOB: 28-Aug-1950   HPI Helen Mahoney is here for follow up and re-evaluation of chronic medical conditions, specifically uncontrolled diabetes. She had been eating in an uncontrolled fashion, not exercising, and even when she tested and saw that  Her numbers were high, neither changed her behavior , nor did she call in Since her most recent lab , after I called her she started changing her behavior and I also adjusted her medication, she brings in a log with much improved numbers. She has had no hypoglycemic episodes.She has had some numbers above 200 but can relate them to dietary indiscretion. She is aware of the need to continue to take this seriously  To improve her health and to call in with concerns She has committed to starting exercisse The PT denies any adverse reactions to current medications since the last visit.  There are no new concerns.  There are no specific complaints   ROS Denies recent fever or chills. Denies sinus pressure, nasal congestion, ear pain or sore throat. Denies chest congestion, productive cough or wheezing. Denies chest pains, palpitations and leg swelling Denies abdominal pain, nausea, vomiting,diarrhea or constipation.   Denies dysuria, frequency, hesitancy or incontinence. Denies joint pain, swelling and limitation in mobility. Denies headaches, seizures, numbness, or tingling. Denies depression, anxiety or insomnia. Denies skin break down or rash.   PE  BP 130/80   Pulse 73   Resp 16   Ht 5\' 5"  (1.651 m)   Wt 248 lb (112.5 kg)   SpO2 98%   BMI 41.27 kg/m   Patient alert and oriented and in no cardiopulmonary distress.  HEENT: No facial asymmetry, EOMI,   oropharynx pink and moist.  Neck supple no JVD, no mass.  Chest: Clear to auscultation bilaterally.  CVS: S1, S2 no murmurs, no S3.Regular rate.  ABD: Soft non tender.   Ext: No edema  MS: Adequate ROM spine, shoulders, hips and  knees.  Skin: Intact, no ulcerations or rash noted.  Psych: Good eye contact, normal affect. Memory intact not anxious or depressed appearing.  CNS: CN 2-12 intact, power,  normal throughout.no focal deficits noted.   Assessment & Plan  Diabetes mellitus, insulin dependent (IDDM), uncontrolled (HCC) Markedly deteriorated due to non compliance stemming from poor nsight into gravity of the illness, education in ofice for 5 minutes, referred to diabetic educator and she wil go Helen Mahoney is reminded of the importance of commitment to daily physical activity for 30 minutes or more, as able and the need to limit carbohydrate intake to 30 to 60 grams per meal to help with blood sugar control.   The need to take medication as prescribed, test blood sugar as directed, and to call between visits if there is a concern that blood sugar is uncontrolled is also discussed.   Helen Mahoney is reminded of the importance of daily foot exam, annual eye examination, and good blood sugar, blood pressure and cholesterol control.  Diabetic Labs Latest Ref Rng & Units 09/09/2017 08/22/2017 03/13/2017 03/04/2017 02/27/2017  HbA1c <5.7 % of total Hgb 8.6(H) - - 7.3(H) -  Microalbumin Not Estab. ug/mL - - 40.9(H) - -  Micro/Creat Ratio 0.0 - 30.0 mg/g creat - - 16.1 - -  Chol <200 mg/dL 169 - - 177 -  HDL >50 mg/dL 47(L) - - 53 -  Calc LDL mg/dL (calc) 103(H) - - 105(H) -  Triglycerides <150 mg/dL 97 - -  97 -  Creatinine 0.50 - 0.99 mg/dL 0.89 0.96 - - 0.96   BP/Weight 10/06/2017 09/29/2017 08/29/2017 07/30/2017 06/27/2017 05/30/2017 1/61/0960  Systolic BP 454 098 119 147 829 562 130  Diastolic BP 80 79 76 80 82 80 80  Wt. (Lbs) 248 - 250 - - - 246  BMI 41.27 - 41.6 - - - 40.94   Foot/eye exam completion dates Latest Ref Rng & Units 05/21/2017 12/11/2016  Eye Exam No Retinopathy - No Retinopathy  Foot exam Order - - -  Foot Form Completion - Done -       F/u with log in 5 weeks  Morbid  obesity Deteriorated. Patient re-educated about  the importance of commitment to a  minimum of 150 minutes of exercise per week.  The importance of healthy food choices with portion control discussed. Encouraged to start a food diary, count calories and to consider  joining a support group. Sample diet sheets offered. Goals set by the patient for the next several months.   Weight /BMI 10/06/2017 08/29/2017 05/21/2017  WEIGHT 248 lb 250 lb 246 lb  HEIGHT 5\' 5"  - 5\' 5"   BMI 41.27 kg/m2 41.6 kg/m2 40.94 kg/m2      Essential hypertension Controlled, no change in medication DASH diet and commitment to daily physical activity for a minimum of 30 minutes discussed and encouraged, as a part of hypertension management. The importance of attaining a healthy weight is also discussed.  BP/Weight 10/06/2017 09/29/2017 08/29/2017 07/30/2017 06/27/2017 05/30/2017 8/65/7846  Systolic BP 962 952 841 324 401 027 253  Diastolic BP 80 79 76 80 82 80 80  Wt. (Lbs) 248 - 250 - - - 246  BMI 41.27 - 41.6 - - - 40.94

## 2017-10-10 NOTE — Assessment & Plan Note (Signed)
Deteriorated. Patient re-educated about  the importance of commitment to a  minimum of 150 minutes of exercise per week.  The importance of healthy food choices with portion control discussed. Encouraged to start a food diary, count calories and to consider  joining a support group. Sample diet sheets offered. Goals set by the patient for the next several months.   Weight /BMI 10/06/2017 08/29/2017 05/21/2017  WEIGHT 248 lb 250 lb 246 lb  HEIGHT 5\' 5"  - 5\' 5"   BMI 41.27 kg/m2 41.6 kg/m2 40.94 kg/m2

## 2017-10-13 ENCOUNTER — Ambulatory Visit: Payer: Medicare HMO | Admitting: Nutrition

## 2017-10-21 ENCOUNTER — Telehealth: Payer: Self-pay | Admitting: Nutrition

## 2017-10-21 NOTE — Telephone Encounter (Signed)
Pt. Called stating her FBS are 50-60's for the last week or two and evening BS are > 200's.  Advised her to call Dr. Griffin Dakin office to discuss her medications and see what DR. SImpson wanted to do about reducing her night time insulin dose. Pt. Verbalized understanding.  She is not scheduled to see me til mid July. 2019. She is taking 50 units of Lantus at nigtht,  GLipizide and Metformin.

## 2017-10-21 NOTE — Telephone Encounter (Signed)
Please call and let patient know she needs to reduce her bedtime insulin dose to 45 units since her nm mornibng sugars are low

## 2017-10-22 NOTE — Telephone Encounter (Signed)
Opened in error

## 2017-10-22 NOTE — Telephone Encounter (Signed)
Pt aware.

## 2017-10-29 ENCOUNTER — Encounter (HOSPITAL_COMMUNITY): Payer: Self-pay

## 2017-10-29 ENCOUNTER — Inpatient Hospital Stay (HOSPITAL_COMMUNITY): Payer: Medicare HMO | Attending: Internal Medicine

## 2017-10-29 VITALS — BP 141/75 | HR 90 | Temp 97.0°F | Resp 18

## 2017-10-29 DIAGNOSIS — E538 Deficiency of other specified B group vitamins: Secondary | ICD-10-CM | POA: Insufficient documentation

## 2017-10-29 MED ORDER — CYANOCOBALAMIN 1000 MCG/ML IJ SOLN
1000.0000 ug | Freq: Once | INTRAMUSCULAR | Status: AC
  Administered 2017-10-29: 1000 ug via INTRAMUSCULAR
  Filled 2017-10-29: qty 1

## 2017-10-29 NOTE — Progress Notes (Signed)
Patient tolerated b12 shot with no complaints voiced. Site clean and dry with no bruising or swelling noted at site.  Band aid applied.  VSS with discharge and left ambulatory with no s/s of distress noted.

## 2017-10-29 NOTE — Patient Instructions (Signed)
[facility]  Discharge Instructions:  You received a b12 shot today.  _______________________________________________________________  Thank you for choosing [facility] to provide your oncology and hematology care.  To afford each patient quality time with our providers, please arrive at least 15 minutes before your scheduled appointment.  You need to re-schedule your appointment if you arrive 10 or more minutes late.  We strive to give you quality time with our providers, and arriving late affects you and other patients whose appointments are after yours.  Also, if you no show three or more times for appointments you may be dismissed from the clinic.  Again, thank you for choosing [facility]. Our hope is that these requests will allow you access to exceptional care and in a timely manner. _______________________________________________________________  If you have questions after your visit, please contact our office at [phone] between the hours of 8:30 a.m. and 5:00 p.m. Voicemails left after 4:30 p.m. will not be returned until the following business day. _______________________________________________________________  For prescription refill requests, have your pharmacy contact our office. _______________________________________________________________  Recommendations made by the consultant and any test results will be sent to your referring physician. _______________________________________________________________ 

## 2017-11-10 ENCOUNTER — Ambulatory Visit (INDEPENDENT_AMBULATORY_CARE_PROVIDER_SITE_OTHER): Payer: Medicare HMO | Admitting: Family Medicine

## 2017-11-10 ENCOUNTER — Other Ambulatory Visit: Payer: Self-pay

## 2017-11-10 ENCOUNTER — Other Ambulatory Visit: Payer: Self-pay | Admitting: Family Medicine

## 2017-11-10 ENCOUNTER — Encounter: Payer: Self-pay | Admitting: Family Medicine

## 2017-11-10 ENCOUNTER — Ambulatory Visit: Payer: Medicare HMO

## 2017-11-10 VITALS — BP 118/80 | HR 99 | Ht 67.0 in | Wt 246.1 lb

## 2017-11-10 DIAGNOSIS — E559 Vitamin D deficiency, unspecified: Secondary | ICD-10-CM | POA: Diagnosis not present

## 2017-11-10 DIAGNOSIS — E1065 Type 1 diabetes mellitus with hyperglycemia: Secondary | ICD-10-CM | POA: Diagnosis not present

## 2017-11-10 DIAGNOSIS — I10 Essential (primary) hypertension: Secondary | ICD-10-CM

## 2017-11-10 MED ORDER — INSULIN GLARGINE 100 UNIT/ML SOLOSTAR PEN: PEN_INJECTOR | SUBCUTANEOUS | 1 refills | Status: DC

## 2017-11-10 NOTE — Addendum Note (Signed)
Addended by: Marion Downer A on: 11/10/2017 02:18 PM   Modules accepted: Orders

## 2017-11-10 NOTE — Progress Notes (Signed)
Helen Mahoney     MRN: [id number]      DOB: [birth year]   HPI Helen Mahoney is here for follow up and re-evaluation of chronic medical conditions,specifically uncontrolled diabetes,  medication management and review of any available recent lab and radiology data.    Still has upcoming appointment with diabetic educator. She has been testing and recording her blood sugars 3 times daily and she has her log sheet which shows much improvement,with the range of her numbers generally falling within the appropriate range. She has had no blood sugar levels below 70 and she has had several over 200, but not over 250. She can explain her high numbers , and I encourage her to write down what she is doing wrong on the days that her numbers are high as a reminder not to repeat the behavior The Helen Mahoney denies any adverse reactions to current medications since the last visit.  Was having sugar lows and reduced lantus to 45 units. Takes glipizide at night independently of meal will change this needs to exercise  ROS Denies recent fever or chills. Denies sinus pressure, nasal congestion, ear pain or sore throat. Denies chest congestion, productive cough or wheezing. Denies chest pains, palpitations and leg swelling Denies abdominal pain, nausea, vomiting,diarrhea or constipation.   Denies dysuria, frequency, hesitancy or incontinence. Denies joint pain, swelling and limitation in mobility. Denies headaches, seizures, numbness, or tingling. Denies depression, anxiety or insomnia. Denies skin break down or rash.   PE  BP 118/80 (BP Location: Left Arm, Patient Position: Sitting, Cuff Size: Large)   Pulse 99   Ht 5\' 7"  (1.702 m)   Wt 246 lb 1.3 oz (111.6 kg)   SpO2 97%   BMI 38.54 kg/m   Patient alert and oriented and in no cardiopulmonary distress.  HEENT: No facial asymmetry, EOMI,   oropharynx pink and moist.  Neck supple no JVD, no mass.  Chest: Clear to auscultation bilaterally.  CVS: S1, S2  no murmurs, no S3.Regular rate.  ABD: Soft non tender.   Ext: No edema  MS: Adequate ROM spine, shoulders, hips and knees.  Skin: Intact, no ulcerations or rash noted.  Psych: Good eye contact, normal affect. Memory intact not anxious or depressed appearing.  CNS: CN 2-12 intact, power,  normal throughout.no focal deficits noted.   Assessment & Plan  Diabetes mellitus, insulin dependent (IDDM), uncontrolled (HCC) Uncontrolled, however improving with recent  change in medication dose and lifestyle, will re eval in 6 weeks Helen Mahoney is reminded of the importance of commitment to daily physical activity for 30 minutes or more, as able and the need to limit carbohydrate intake to 30 to 60 grams per meal to help with blood sugar control.   The need to take medication as prescribed, test blood sugar as directed, and to call between visits if there is a concern that blood sugar is uncontrolled is also discussed.   Helen Mahoney is reminded of the importance of daily foot exam, annual eye examination, and good blood sugar, blood pressure and cholesterol control.  Diabetic Labs Latest Ref Rng & Units 09/09/2017 08/22/2017 03/13/2017 03/04/2017 02/27/2017  HbA1c <5.7 % of total Hgb 8.6(H) - - 7.3(H) -  Microalbumin Not Estab. ug/mL - - 40.9(H) - -  Micro/Creat Ratio 0.0 - 30.0 mg/g creat - - 16.1 - -  Chol <200 mg/dL 169 - - 177 -  HDL >50 mg/dL 47(L) - - 53 -  Calc LDL mg/dL (calc) 103(H) - -  105(H) -  Triglycerides <150 mg/dL 97 - - 97 -  Creatinine 0.50 - 0.99 mg/dL 0.89 0.96 - - 0.96   BP/Weight 11/10/2017 10/29/2017 10/06/2017 09/29/2017 08/29/2017 12/05/3732 06/07/7679  Systolic BP 157 262 035 597 416 384 536  Diastolic BP 80 75 80 79 76 80 82  Wt. (Lbs) 246.08 - 248 - 250 - -  BMI 38.54 - 41.27 - 41.6 - -   Foot/eye exam completion dates Latest Ref Rng & Units 05/21/2017 12/11/2016  Eye Exam No Retinopathy - No Retinopathy  Foot exam Order - - -  Foot Form Completion - Done -         Morbid obesity Slight improvement  Patient re-educated about  the importance of commitment to a  minimum of 150 minutes of exercise per week.  The importance of healthy food choices with portion control discussed. Encouraged to start a food diary, count calories and to consider  joining a support group. Sample diet sheets offered. Goals set by the patient for the next several months.   Weight /BMI 11/10/2017 10/06/2017 08/29/2017  WEIGHT 246 lb 1.3 oz 248 lb 250 lb  HEIGHT 5\' 7"  5\' 5"  -  BMI 38.54 kg/m2 41.27 kg/m2 41.6 kg/m2      Essential hypertension Controlled, no change in medication DASH diet and commitment to daily physical activity for a minimum of 30 minutes discussed and encouraged, as a part of hypertension management. The importance of attaining a healthy weight is also discussed.  BP/Weight 11/10/2017 10/29/2017 10/06/2017 09/29/2017 08/29/2017 08/03/8030 04/30/2480  Systolic BP 500 370 488 [phone] 888  Diastolic BP 80 75 80 79 76 80 82  Wt. (Lbs) 246.08 - 248 - 250 - -  BMI 38.54 - 41.27 - 41.6 - -

## 2017-11-10 NOTE — Patient Instructions (Addendum)
F/U with MD in August as before   Non fasting HBa1C, Chem 7 and EGFR, tSH and vit D to be drawn at [facility] on 8/14/20129 collect lab sheet in room  Please test the last blood sugar for the day at BEDTIME , around midniight, this should be between 130 to 180.  Numbers are  much improved  Please start moving outside  Of the stores  Thank you  for choosing [facility]. We consider it a privelige to serve you.  Delivering excellent health care in a caring and  compassionate way is our goal.  Partnering with you,  so that together we can achieve this goal is our strategy.

## 2017-11-14 NOTE — Assessment & Plan Note (Signed)
Controlled, no change in medication DASH diet and commitment to daily physical activity for a minimum of 30 minutes discussed and encouraged, as a part of hypertension management. The importance of attaining a healthy weight is also discussed.  BP/Weight 11/10/2017 10/29/2017 10/06/2017 09/29/2017 08/29/2017 0/0/1642 9/0/3795  Systolic BP 583 167 [phone] 758  Diastolic BP 80 75 80 79 76 80 82  Wt. (Lbs) 246.08 - 248 - 250 - -  BMI 38.54 - 41.27 - 41.6 - -

## 2017-11-14 NOTE — Assessment & Plan Note (Signed)
Uncontrolled, however improving with recent  change in medication dose and lifestyle, will re eval in 6 weeks Helen Mahoney is reminded of the importance of commitment to daily physical activity for 30 minutes or more, as able and the need to limit carbohydrate intake to 30 to 60 grams per meal to help with blood sugar control.   The need to take medication as prescribed, test blood sugar as directed, and to call between visits if there is a concern that blood sugar is uncontrolled is also discussed.   Helen Mahoney is reminded of the importance of daily foot exam, annual eye examination, and good blood sugar, blood pressure and cholesterol control.  Diabetic Labs Latest Ref Rng & Units 09/09/2017 08/22/2017 03/13/2017 03/04/2017 02/27/2017  HbA1c <5.7 % of total Hgb 8.6(H) - - 7.3(H) -  Microalbumin Not Estab. ug/mL - - 40.9(H) - -  Micro/Creat Ratio 0.0 - 30.0 mg/g creat - - 16.1 - -  Chol <200 mg/dL 169 - - 177 -  HDL >50 mg/dL 47(L) - - 53 -  Calc LDL mg/dL (calc) 103(H) - - 105(H) -  Triglycerides <150 mg/dL 97 - - 97 -  Creatinine 0.50 - 0.99 mg/dL 0.89 0.96 - - 0.96   BP/Weight 11/10/2017 10/29/2017 10/06/2017 09/29/2017 08/29/2017 08/04/8889 10/05/4501  Systolic BP 888 280 034 917 915 056 979  Diastolic BP 80 75 80 79 76 80 82  Wt. (Lbs) 246.08 - 248 - 250 - -  BMI 38.54 - 41.27 - 41.6 - -   Foot/eye exam completion dates Latest Ref Rng & Units 05/21/2017 12/11/2016  Eye Exam No Retinopathy - No Retinopathy  Foot exam Order - - -  Foot Form Completion - Done -

## 2017-11-14 NOTE — Assessment & Plan Note (Signed)
Slight improvement  Patient re-educated about  the importance of commitment to a  minimum of 150 minutes of exercise per week.  The importance of healthy food choices with portion control discussed. Encouraged to start a food diary, count calories and to consider  joining a support group. Sample diet sheets offered. Goals set by the patient for the next several months.   Weight /BMI 11/10/2017 10/06/2017 08/29/2017  WEIGHT 246 lb 1.3 oz 248 lb 250 lb  HEIGHT 5\' 7"  5\' 5"  -  BMI 38.54 kg/m2 41.27 kg/m2 41.6 kg/m2

## 2017-11-25 ENCOUNTER — Ambulatory Visit (INDEPENDENT_AMBULATORY_CARE_PROVIDER_SITE_OTHER): Payer: Medicare HMO

## 2017-11-25 VITALS — BP 130/80 | HR 105 | Resp 16 | Ht 67.0 in | Wt 245.0 lb

## 2017-11-25 DIAGNOSIS — Z0000 Encounter for general adult medical examination without abnormal findings: Secondary | ICD-10-CM

## 2017-11-25 NOTE — Progress Notes (Signed)
Subjective:   Helen Mahoney is a 67 y.o. female who presents for Medicare Annual (Subsequent) preventive examination.  Review of Systems:   Cardiac Risk Factors include: advanced age (>67men, >15 women);diabetes mellitus;dyslipidemia;obesity (BMI >30kg/m2);sedentary lifestyle     Objective:     Vitals: BP 130/80   Pulse (!) 105   Resp 16   Ht 5\' 7"  (1.702 m)   Wt 245 lb (111.1 kg)   SpO2 94%   BMI 38.37 kg/m   Body mass index is 38.37 kg/m.  Advanced Directives 11/25/2017 10/29/2017 09/29/2017 08/29/2017 07/30/2017 07/30/2017 06/27/2017  Does Patient Have a Medical Advance Directive? No No No No No No No  Would patient like information on creating a medical advance directive? Yes (ED - Information included in AVS) No - Patient declined No - Patient declined No - Patient declined No - Patient declined No - Patient declined No - Patient declined  Pre-existing out of facility DNR order (yellow form or pink MOST form) - - - - - - -    Tobacco Social History   Tobacco Use  Smoking Status Never Smoker  Smokeless Tobacco Never Used     Counseling given: Not Answered   Clinical Intake:  Pre-visit preparation completed: Yes  Pain : No/denies pain Pain Score: 0-No pain     Nutritional Status: BMI > 30  Obese Diabetes: Yes CBG done?: No Did pt. bring in CBG monitor from home?: No  How often do you need to have someone help you when you read instructions, pamphlets, or other written materials from your doctor or pharmacy?: 1 - Never What is the last grade level you completed in school?: 12th grade  Interpreter Needed?: No  Information entered by :: Tylor Gambrill LPN   Past Medical History:  Diagnosis Date  . 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 B12 deficiency 06/16/2015  . Colon polyps   . Diabetes mellitus, type 2 ([place])   . Hyperlipidemia   . Hypertension   . Microcytic  anemia 05/18/2015   2013: TCS/EGD 2017: TCS/EGD HYPERPLASTIC GASTRIC POLYPS, LYMPHOCYTIC GASTRITIS   . Obesity    Past Surgical History:  Procedure Laterality Date  . bilateral tubal ligation  1979  . CHOLECYSTECTOMY  2001   ACUTE CHOLECYSTITIS/GALLSTONES  . COLONOSCOPY  05/14/2011   Dr. Oneida Alar: sessile polyp in sigmoid colon, internal hemorrhoids, hyerplastic polyps  . COLONOSCOPY N/A 05/15/2015   Procedure: COLONOSCOPY;  Surgeon: Danie Binder, MD;  [facility];  Service: Endoscopy;  Laterality: N/A;  0830  . COLONOSCOPY W/ POLYPECTOMY  NOV 2008 [facility] ANEMIA   POLYP NO RETRIEVED  . COLONOSCOPY W/ POLYPECTOMY  2006 DR. SMTH   POLYP?  . ESOPHAGOGASTRODUODENOSCOPY  05/14/2011   Dr. Oneida Alar: sessile polyps in the cardia, mild gastritis. Chronic duodenitis consistent with peptic duodenitis, chronic active H.pylori gastritis.   Marland Kitchen ESOPHAGOGASTRODUODENOSCOPY N/A 05/15/2015   Procedure: ESOPHAGOGASTRODUODENOSCOPY (EGD);  Surgeon: Danie Binder, MD;  [facility];  Service: Endoscopy;  Laterality: N/A;  . GIVENS CAPSULE STUDY N/A 06/02/2015   Procedure: GIVENS CAPSULE STUDY;  Surgeon: Danie Binder, MD;  Location: [facility];  Service: Endoscopy;  Laterality: N/A;  0700  . TUBAL LIGATION    . UPPER GASTROINTESTINAL ENDOSCOPY  NOV 2008 [facility] ANEMIA   NL EXAM, urease neg   Family History  Problem Relation Age of Onset  . Heart disease Mother   . Diabetes  Mother   . Hypertension Mother   . Stroke Mother   . Breast cancer Mother   . Diabetes Father   . Heart attack Father   . Kidney disease Father   . Hypertension Sister   . Diabetes Sister   . Diabetes Sister   . Mental illness Sister   . Hypertension Sister   . Hepatitis C Sister   . Mental illness Sister   . Diabetes Sister   . Hypertension Sister   . Mental illness Sister   . Colon cancer Sister 23  . Diabetes Brother    Social History   Socioeconomic History  . Marital status: Married    Spouse name: Not  on file  . Number of children: Not on file  . Years of education: Not on file  . Highest education level: Not on file  Occupational History  . Not on file  Social Needs  . Financial resource strain: Not hard at all  . Food insecurity:    Worry: Never true    Inability: Never true  . Transportation needs:    Medical: No    Non-medical: No  Tobacco Use  . Smoking status: Never Smoker  . Smokeless tobacco: Never Used  Substance and Sexual Activity  . Alcohol use: No  . Drug use: No  . Sexual activity: Not Currently  Lifestyle  . Physical activity:    Days per week: 1 day    Minutes per session: 20 min  . Stress: Not at all  Relationships  . Social connections:    Talks on phone: More than three times a week    Gets together: More than three times a week    Attends religious service: More than 4 times per year    Active member of club or organization: No    Attends meetings of clubs or organizations: Never    Relationship status: Married  Other Topics Concern  . Not on file  Social History Narrative  . Not on file    Outpatient Encounter Medications as of 11/25/2017  Medication Sig  . aspirin 81 MG tablet Take 81 mg by mouth daily.  . Calcium Citrate-Vitamin D (CITRACAL PETITES/VITAMIN D) 200-250 MG-UNIT TABS Take 1 tablet by mouth daily.   . Cholecalciferol (VITAMIN D3) 2000 units TABS Take 1 tablet by mouth daily.  . Ferrous Sulfate (IRON) 325 (65 FE) MG TABS Take 1 tablet by mouth daily.  Marland Kitchen glipiZIDE (GLUCOTROL) 10 MG tablet TAKE 1 TABLET TWICE DAILY BEFORE MEALS  . Insulin Glargine (LANTUS SOLOSTAR) 100 UNIT/ML Solostar Pen INJECT 50 UNITS SUBCUTANEOUSLY ONCE DAILY AT  10:00  PM  . metFORMIN (GLUCOPHAGE) 1000 MG tablet TAKE 1 TABLET TWICE DAILY WITH MEALS  . Multiple Vitamin (MULTIVITAMINS PO) Take 1 tablet by mouth daily.   . pravastatin (PRAVACHOL) 80 MG tablet TAKE 1 TABLET ONE TIME DAILY IN THE EVENING  . RELION PEN NEEDLE 31G/8MM 31G X 8 MM MISC USE ONE  ONCE  DAILY AS DIRECTED  . TRUE METRIX BLOOD GLUCOSE TEST test strip TEST THREE TIMES DAILY   No facility-administered encounter medications on file as of 11/25/2017.     Activities of Daily Living In your present state of health, do you have any difficulty performing the following activities: 11/25/2017  Hearing? N  Vision? N  Difficulty concentrating or making decisions? N  Walking or climbing stairs? N  Dressing or bathing? N  Doing errands, shopping? N  Preparing Food and eating ? N  Using  the Toilet? N  In the past six months, have you accidently leaked urine? N  Do you have problems with loss of bowel control? N  Managing your Medications? N  Managing your Finances? N  Housekeeping or managing your Housekeeping? N  Some recent data might be hidden    Patient Care Team: Fayrene Helper, MD as PCP - General Fields, Marga Melnick, MD as Consulting Physician (Gastroenterology) Whitney Muse, Kelby Fam, MD (Inactive) as Consulting Physician (Hematology and Oncology)    Assessment:   This is a routine wellness examination for [place].  Exercise Activities and Dietary recommendations Current Exercise Habits: The patient does not participate in regular exercise at present, Exercise limited by: None identified  Goals    . Exercise 3x per week (30 min per time)     Recommend starting a routine exercise program at least 3 days a week for 30-45 minutes at a time as tolerated.      . Increase physical activity     Start walking for 30 mins/5 days a week        Fall Risk Fall Risk  11/25/2017 11/10/2017 10/06/2017 11/04/2016 10/30/2015  Falls in the past year? No No No No No   Is the patient's home free of loose throw rugs in walkways, pet beds, electrical cords, etc?   yes      Grab bars in the bathroom? yes      Handrails on the stairs?   yes      Adequate lighting?   yes  Timed Get Up and Go performed:   Depression Screen PHQ 2/9 Scores 11/25/2017 11/10/2017 10/06/2017 11/04/2016  PHQ -  2 Score 0 0 0 0  PHQ- 9 Score - 1 2 -     Cognitive Function     6CIT Screen 11/04/2016  What Year? 0 points  What month? 0 points  What time? 0 points  Count back from 20 0 points  Months in reverse 0 points  Repeat phrase 0 points  Total Score 0    Immunization History  Administered Date(s) Administered  . Influenza Split 02/14/2011, 03/18/2012  . Influenza Whole 02/03/2007, 01/25/2008, 01/20/2009, 01/04/2010  . Influenza,inj,Quad PF,6+ Mos 02/18/2013, 03/14/2014, 01/12/2015, 02/22/2016, 01/27/2017  . Pneumococcal Conjugate-13 07/13/2014  . Pneumococcal Polysaccharide-23 09/14/2009, 09/11/2015  . Zoster 10/16/2010    Qualifies for Shingles Vaccine?  Screening Tests Health Maintenance  Topic Date Due  . TETANUS/TDAP  03/18/2021 (Originally 06/02/1969)  . INFLUENZA VACCINE  11/27/2017  . OPHTHALMOLOGY EXAM  12/11/2017  . HEMOGLOBIN A1C  03/12/2018  . URINE MICROALBUMIN  03/13/2018  . FOOT EXAM  05/21/2018  . MAMMOGRAM  02/18/2019  . COLONOSCOPY  05/14/2020  . DEXA SCAN  Completed  . Hepatitis C Screening  Completed  . PNA vac Low Risk Adult  Completed    Cancer Screenings: Lung: Low Dose CT Chest recommended if Age 34-80 years, 30 pack-year currently smoking OR have quit w/in 15years. Patient does not qualify. Breast:  Up to date on Mammogram? No referral entered   Up to date of Bone Density/Dexa? Yes Colorectal: up to date  Additional Screenings:  Hepatitis C Screening:      Plan:      I have personally reviewed and noted the following in the patient's chart:   . Medical and social history . Use of alcohol, tobacco or illicit drugs  . Current medications and supplements . Functional ability and status . Nutritional status . Physical activity . Advanced directives . List of  other physicians . Hospitalizations, surgeries, and ER visits in previous 12 months . Vitals . Screenings to include cognitive, depression, and falls . Referrals and  appointments  In addition, I have reviewed and discussed with patient certain preventive protocols, quality metrics, and best practice recommendations. A written personalized care plan for preventive services as well as general preventive health recommendations were provided to patient.     Kate Sable, LPN, LPN  7/40/8144

## 2017-11-25 NOTE — Patient Instructions (Addendum)
Helen Mahoney , Thank you for taking time to come for your Medicare Wellness Visit. I appreciate your ongoing commitment to your health goals. Please review the following plan we discussed and let me know if I can assist you in the future.   Screening recommendations/referrals: Colonoscopy: Done Mammogram: Need to schedule Bone Density: Done Recommended yearly ophthalmology/optometry visit for glaucoma screening and checkup Recommended yearly dental visit for hygiene and checkup  Vaccinations: Influenza vaccine: Due in Sept Pneumococcal vaccine: Done Tdap vaccine: Due - (patient allergic) Shingles vaccine: ask insurance  Advanced directives: Forms given  Conditions/risks identified: Done  Next appointment: 12/15/17 9:00am  Preventive Care 40-64 Years, Female Preventive care refers to lifestyle choices and visits with your health care provider that can promote health and wellness. What does preventive care include?  A yearly physical exam. This is also called an annual well check.  Dental exams once or twice a year.  Routine eye exams. Ask your health care provider how often you should have your eyes checked.  Personal lifestyle choices, including:  Daily care of your teeth and gums.  Regular physical activity.  Eating a healthy diet.  Avoiding tobacco and drug use.  Limiting alcohol use.  Practicing safe sex.  Taking low-dose aspirin daily starting at age 69.  Taking vitamin and mineral supplements as recommended by your health care provider. What happens during an annual well check? The services and screenings done by your health care provider during your annual well check will depend on your age, overall health, lifestyle risk factors, and family history of disease. Counseling  Your health care provider may ask you questions about your:  Alcohol use.  Tobacco use.  Drug use.  Emotional well-being.  Home and relationship well-being.  Sexual  activity.  Eating habits.  Work and work Statistician.  Method of birth control.  Menstrual cycle.  Pregnancy history. Screening  You may have the following tests or measurements:  Height, weight, and BMI.  Blood pressure.  Lipid and cholesterol levels. These may be checked every 5 years, or more frequently if you are over 24 years old.  Skin check.  Lung cancer screening. You may have this screening every year starting at age 19 if you have a 30-pack-year history of smoking and currently smoke or have quit within the past 15 years.  Fecal occult blood test (FOBT) of the stool. You may have this test every year starting at age 26.  Flexible sigmoidoscopy or colonoscopy. You may have a sigmoidoscopy every 5 years or a colonoscopy every 10 years starting at age 34.  Hepatitis C blood test.  Hepatitis B blood test.  Sexually transmitted disease (STD) testing.  Diabetes screening. This is done by checking your blood sugar (glucose) after you have not eaten for a while (fasting). You may have this done every 1-3 years.  Mammogram. This may be done every 1-2 years. Talk to your health care provider about when you should start having regular mammograms. This may depend on whether you have a family history of breast cancer.  BRCA-related cancer screening. This may be done if you have a family history of breast, ovarian, tubal, or peritoneal cancers.  Pelvic exam and Pap test. This may be done every 3 years starting at age 61. Starting at age 15, this may be done every 5 years if you have a Pap test in combination with an HPV test.  Bone density scan. This is done to screen for osteoporosis. You may have this scan  if you are at high risk for osteoporosis. Discuss your test results, treatment options, and if necessary, the need for more tests with your health care provider. Vaccines  Your health care provider may recommend certain vaccines, such as:  Influenza vaccine. This is  recommended every year.  Tetanus, diphtheria, and acellular pertussis (Tdap, Td) vaccine. You may need a Td booster every 10 years.  Zoster vaccine. You may need this after age 71.  Pneumococcal 13-valent conjugate (PCV13) vaccine. You may need this if you have certain conditions and were not previously vaccinated.  Pneumococcal polysaccharide (PPSV23) vaccine. You may need one or two doses if you smoke cigarettes or if you have certain conditions. Talk to your health care provider about which screenings and vaccines you need and how often you need them. This information is not intended to replace advice given to you by your health care provider. Make sure you discuss any questions you have with your health care provider. Document Released: 05/12/2015 Document Revised: 01/03/2016 Document Reviewed: 02/14/2015 Elsevier Interactive Patient Education  2017 [employer] Prevention in the Home Falls can cause injuries. They can happen to people of all ages. There are many things you can do to make your home safe and to help prevent falls. What can I do on the outside of my home?  Regularly fix the edges of walkways and driveways and fix any cracks.  Remove anything that might make you trip as you walk through a door, such as a raised step or threshold.  Trim any bushes or trees on the path to your home.  Use bright outdoor lighting.  Clear any walking paths of anything that might make someone trip, such as rocks or tools.  Regularly check to see if handrails are loose or broken. Make sure that both sides of any steps have handrails.  Any raised decks and porches should have guardrails on the edges.  Have any leaves, snow, or ice cleared regularly.  Use sand or salt on walking paths during winter.  Clean up any spills in your garage right away. This includes oil or grease spills. What can I do in the bathroom?  Use night lights.  Install grab bars by the toilet and in  the tub and shower. Do not use towel bars as grab bars.  Use non-skid mats or decals in the tub or shower.  If you need to sit down in the shower, use a plastic, non-slip stool.  Keep the floor dry. Clean up any water that spills on the floor as soon as it happens.  Remove soap buildup in the tub or shower regularly.  Attach bath mats securely with double-sided non-slip rug tape.  Do not have throw rugs and other things on the floor that can make you trip. What can I do in the bedroom?  Use night lights.  Make sure that you have a light by your bed that is easy to reach.  Do not use any sheets or blankets that are too big for your bed. They should not hang down onto the floor.  Have a firm chair that has side arms. You can use this for support while you get dressed.  Do not have throw rugs and other things on the floor that can make you trip. What can I do in the kitchen?  Clean up any spills right away.  Avoid walking on wet floors.  Keep items that you use a lot in easy-to-reach places.  If  you need to reach something above you, use a strong step stool that has a grab bar.  Keep electrical cords out of the way.  Do not use floor polish or wax that makes floors slippery. If you must use wax, use non-skid floor wax.  Do not have throw rugs and other things on the floor that can make you trip. What can I do with my stairs?  Do not leave any items on the stairs.  Make sure that there are handrails on both sides of the stairs and use them. Fix handrails that are broken or loose. Make sure that handrails are as long as the stairways.  Check any carpeting to make sure that it is firmly attached to the stairs. Fix any carpet that is loose or worn.  Avoid having throw rugs at the top or bottom of the stairs. If you do have throw rugs, attach them to the floor with carpet tape.  Make sure that you have a light switch at the top of the stairs and the bottom of the stairs. If  you do not have them, ask someone to add them for you. What else can I do to help prevent falls?  Wear shoes that:  Do not have high heels.  Have rubber bottoms.  Are comfortable and fit you well.  Are closed at the toe. Do not wear sandals.  If you use a stepladder:  Make sure that it is fully opened. Do not climb a closed stepladder.  Make sure that both sides of the stepladder are locked into place.  Ask someone to hold it for you, if possible.  Clearly mark and make sure that you can see:  Any grab bars or handrails.  First and last steps.  Where the edge of each step is.  Use tools that help you move around (mobility aids) if they are needed. These include:  Canes.  Walkers.  Scooters.  Crutches.  Turn on the lights when you go into a dark area. Replace any light bulbs as soon as they burn out.  Set up your furniture so you have a clear path. Avoid moving your furniture around.  If any of your floors are uneven, fix them.  If there are any pets around you, be aware of where they are.  Review your medicines with your doctor. Some medicines can make you feel dizzy. This can increase your chance of falling. Ask your doctor what other things that you can do to help prevent falls. This information is not intended to replace advice given to you by your health care provider. Make sure you discuss any questions you have with your health care provider. Document Released: 02/09/2009 Document Revised: 09/21/2015 Document Reviewed: 05/20/2014 Elsevier Interactive Patient Education  2017 [employer].

## 2017-11-28 ENCOUNTER — Encounter (HOSPITAL_COMMUNITY): Payer: Self-pay

## 2017-11-28 ENCOUNTER — Inpatient Hospital Stay (HOSPITAL_COMMUNITY): Payer: Medicare HMO | Attending: Internal Medicine

## 2017-11-28 ENCOUNTER — Other Ambulatory Visit: Payer: Self-pay | Admitting: Family Medicine

## 2017-11-28 ENCOUNTER — Other Ambulatory Visit: Payer: Self-pay

## 2017-11-28 VITALS — BP 160/84 | HR 88 | Temp 98.7°F | Resp 18

## 2017-11-28 DIAGNOSIS — E538 Deficiency of other specified B group vitamins: Secondary | ICD-10-CM | POA: Diagnosis not present

## 2017-11-28 MED ORDER — CYANOCOBALAMIN 1000 MCG/ML IJ SOLN
1000.0000 ug | Freq: Once | INTRAMUSCULAR | Status: AC
  Administered 2017-11-28: 1000 ug via INTRAMUSCULAR
  Filled 2017-11-28: qty 1

## 2017-11-28 NOTE — Patient Instructions (Signed)
[facility] at [facility] Discharge Instructions  B12 injection given today. Follow up as scheduled   Thank you for choosing [facility] at [facility] to provide your oncology and hematology care.  To afford each patient quality time with our provider, please arrive at least 15 minutes before your scheduled appointment time.   If you have a lab appointment with the Cancer Center please come in thru the  Main Entrance and check in at the main information desk  You need to re-schedule your appointment should you arrive 10 or more minutes late.  We strive to give you quality time with our providers, and arriving late affects you and other patients whose appointments are after yours.  Also, if you no show three or more times for appointments you may be dismissed from the clinic at the providers discretion.     Again, thank you for choosing [facility].  Our hope is that these requests will decrease the amount of time that you wait before being seen by our physicians.       _____________________________________________________________  Should you have questions after your visit to [facility], please contact our office at [phone] between the hours of 8:00 a.m. and 4:30 p.m.  Voicemails left after 4:00 p.m. will not be returned until the following business day.  For prescription refill requests, have your pharmacy contact our office and allow 72 hours.    Cancer Center Support Programs:   > Cancer Support Group  2nd Tuesday of the month 1pm-2pm, Journey Room   

## 2017-11-28 NOTE — Progress Notes (Signed)
Helen Mahoney presents today for injection per MD orders. B12 1,000 mcg administered SQ in left Upper Arm. Administration without incident. Patient tolerated well.  Patient tolerated it well without problems. Vitals stable and discharged home from clinic ambulatory. Follow up as scheduled.

## 2017-12-04 ENCOUNTER — Encounter: Payer: Medicare HMO | Attending: Family Medicine | Admitting: Nutrition

## 2017-12-04 VITALS — Ht 66.0 in | Wt 246.0 lb

## 2017-12-04 DIAGNOSIS — E10649 Type 1 diabetes mellitus with hypoglycemia without coma: Secondary | ICD-10-CM | POA: Insufficient documentation

## 2017-12-04 DIAGNOSIS — Z713 Dietary counseling and surveillance: Secondary | ICD-10-CM | POA: Diagnosis not present

## 2017-12-04 DIAGNOSIS — E1165 Type 2 diabetes mellitus with hyperglycemia: Secondary | ICD-10-CM

## 2017-12-04 DIAGNOSIS — IMO0002 Reserved for concepts with insufficient information to code with codable children: Secondary | ICD-10-CM

## 2017-12-04 DIAGNOSIS — E669 Obesity, unspecified: Secondary | ICD-10-CM

## 2017-12-04 DIAGNOSIS — E118 Type 2 diabetes mellitus with unspecified complications: Secondary | ICD-10-CM

## 2017-12-04 NOTE — Progress Notes (Signed)
  Medical Nutrition Therapy:  Appt start time: 0800 end time:  0900   Assessment:  Primary concerns today: Diabetes Type 2,    Lantus 45 units, Metformi 100 mg BID, Glipizide 10 mg BID.  Lives with husband. He does most of the cooking and shopping.  Changed eating habit with more fresh fruits amd vegetables. Not exercising.  Last A1C was 8.6%. BS are better now. WIll get labs done next week when she sees Dr. Moshe Cipro Engaged into making better food choices and being more consist with medications and testing.  Preferred Learning Style:  Auditory  No preference indicated   Learning Readiness:  Ready  Change in progress   MEDICATIONS:   DIETARY INTAKE: Eating three meals per day. Drinks a lot of Coke zero, eating donuts, sweets and processed foods.  24-hr recall:   Usual physical activity: ADL  Estimated energy needs: 1200  calories 135 g carbohydrates 90 g protein 33 g fat  Progress Towards Goal(s):  In progress.   Nutritional Diagnosis:  NB-1.1 Food and nutrition-related knowledge deficit As related to Diabetes.  As evidenced by A1C 8.6%.    Intervention: Nutrition and Diabetes education provided on My Plate, CHO counting, meal planning, portion sizes, timing of meals, avoiding snacks between meals unless having a low blood sugar, target ranges for A1C and blood sugars, signs/symptoms and treatment of hyper/hypoglycemia, monitoring blood sugars, taking medications as prescribed, benefits of exercising 30 minutes per day and prevention of complications of DM. Marland Kitchen Goals 1. Follow  My Plate 2. Eat meals on time 3. Cut out diet soda and drink only water 4. Cut out sweets and snacks 5. Exercise 15-30 minutes  A day Talk to Dr. Moshe Cipro about reducing Lantus and stopping Glipizide. Lose 1 lb per week  Teaching Method Utilized: Visual Auditory Hands on  Handouts given during visit include:  THe Plate Method   Meal Plan Card  Barriers to learning/adherence to  lifestyle change: none  Demonstrated degree of understanding via:  Teach Back   Monitoring/Evaluation:  Dietary intake, exercise, meal planing, and body weight in 1 month(s).

## 2017-12-04 NOTE — Patient Instructions (Signed)
Goals 1. Follow  My Plate 2. Eat meals on time 3. Cut out diet soda and drink only water 4. Cut out sweets and snacks 5. Exercise 15-30 minutes  A day Talk to Dr. Moshe Cipro about reducing Lantus and stopping Glipizide. Lose 1 lb per week

## 2017-12-09 DIAGNOSIS — E1065 Type 1 diabetes mellitus with hyperglycemia: Secondary | ICD-10-CM | POA: Diagnosis not present

## 2017-12-09 DIAGNOSIS — E559 Vitamin D deficiency, unspecified: Secondary | ICD-10-CM | POA: Diagnosis not present

## 2017-12-09 DIAGNOSIS — I10 Essential (primary) hypertension: Secondary | ICD-10-CM | POA: Diagnosis not present

## 2017-12-10 LAB — HEMOGLOBIN A1C W/OUT EAG: HEMOGLOBIN A1C: 7.5 %{Hb} — AB (ref <5.7)

## 2017-12-10 LAB — TSH: TSH: 2.54 mIU/L (ref 0.40–4.50)

## 2017-12-10 LAB — BASIC METABOLIC PANEL WITH GFR
BUN: 11 mg/dL (ref 7–25)
CHLORIDE: 101 mmol/L (ref 98–110)
CO2: 29 mmol/L (ref 20–32)
Calcium: 9.1 mg/dL (ref 8.6–10.4)
Creat: 0.94 mg/dL (ref 0.50–0.99)
GFR, Est African American: 73 mL/min/{1.73_m2} (ref >=60)
GFR, Est Non African American: 63 mL/min/{1.73_m2} (ref >=60)
Glucose, Bld: 174 mg/dL — ABNORMAL HIGH (ref 65–99)
Potassium: 4.1 mmol/L (ref 3.5–5.3)
Sodium: 139 mmol/L (ref 135–146)

## 2017-12-10 LAB — VITAMIN D 25 HYDROXY (VIT D DEFICIENCY, FRACTURES): VIT D 25 HYDROXY: 50 ng/mL (ref 30–100)

## 2017-12-15 ENCOUNTER — Ambulatory Visit: Payer: Medicare HMO | Admitting: Family Medicine

## 2017-12-17 ENCOUNTER — Encounter: Payer: Self-pay | Admitting: Nutrition

## 2017-12-18 ENCOUNTER — Ambulatory Visit (INDEPENDENT_AMBULATORY_CARE_PROVIDER_SITE_OTHER): Payer: Medicare HMO | Admitting: Family Medicine

## 2017-12-18 ENCOUNTER — Encounter: Payer: Self-pay | Admitting: Family Medicine

## 2017-12-18 VITALS — BP 130/80 | HR 94 | Resp 16 | Ht 66.0 in | Wt 246.0 lb

## 2017-12-18 DIAGNOSIS — Z1231 Encounter for screening mammogram for malignant neoplasm of breast: Secondary | ICD-10-CM

## 2017-12-18 DIAGNOSIS — E1065 Type 1 diabetes mellitus with hyperglycemia: Secondary | ICD-10-CM

## 2017-12-18 DIAGNOSIS — I10 Essential (primary) hypertension: Secondary | ICD-10-CM | POA: Diagnosis not present

## 2017-12-18 DIAGNOSIS — E785 Hyperlipidemia, unspecified: Secondary | ICD-10-CM

## 2017-12-18 DIAGNOSIS — Z23 Encounter for immunization: Secondary | ICD-10-CM

## 2017-12-18 NOTE — Patient Instructions (Signed)
F/U last week in November, call if you need me before  Flu vaccine today.  Congrats on improved blood sugar    Fasting lipid, cmp and EGFr, hBa1C Nov 20 or shrtly after ( [facility])  You need shingrix vaccine , check at your pharmacy and get this if able    It is important that you exercise regularly at least 30 minutes 5 times a week. If you develop chest pain, have severe difficulty breathing, or feel very tired, stop exercising immediately and seek medical attention   Please work on good  health habits so that your health will improve. 1. Commitment to daily physical activity for 30 to 60  minutes, if you are able to do this.  2. Commitment to wise food choices. Aim for half of your  food intake to be vegetable and fruit, one quarter starchy foods, and one quarter protein. Try to eat on a regular schedule  3 meals per day, snacking between meals should be limited to vegetables or fruits or small portions of nuts. 64 ounces of water per day is generally recommended, unless you have specific health conditions, like heart failure or kidney failure where you will need to limit fluid intake.  3. Commitment to sufficient and a  good quality of physical and mental rest daily, generally between 6 to 8 hours per day.  WITH PERSISTANCE AND PERSEVERANCE, THE IMPOSSIBLE , BECOMES THE NORM!

## 2017-12-21 NOTE — Assessment & Plan Note (Signed)
Controlled, no change in medication DASH diet and commitment to daily physical activity for a minimum of 30 minutes discussed and encouraged, as a part of hypertension management. The importance of attaining a healthy weight is also discussed.  BP/Weight 12/18/2017 12/04/2017 11/28/2017 11/25/2017 11/10/2017 10/29/2017 1/49/9692  Systolic BP 493 - 241 991 444 584 835  Diastolic BP 80 - 84 80 80 75 80  Wt. (Lbs) 246 246 - 245 246.08 - 248  BMI 39.71 39.71 - 38.37 38.54 - 41.27

## 2017-12-21 NOTE — Assessment & Plan Note (Signed)
Unchanged. Patient re-educated about  the importance of commitment to a  minimum of 150 minutes of exercise per week.  The importance of healthy food choices with portion control discussed. Encouraged to start a food diary, count calories and to consider  joining a support group. Sample diet sheets offered. Goals set by the patient for the next several months.   Weight /BMI 12/18/2017 12/04/2017 11/25/2017  WEIGHT 246 lb 246 lb 245 lb  HEIGHT 5\' 6"  5\' 6"  5\' 7"   BMI 39.71 kg/m2 39.71 kg/m2 38.37 kg/m2

## 2017-12-21 NOTE — Progress Notes (Signed)
Helen Mahoney     MRN: [id number]      DOB: 12-24-1950   HPI Ms. Aispuro is here for follow up and re-evaluation of chronic medical conditions, medication management and review of any available recent lab and radiology data.  Preventive health is updated, specifically  Cancer screening and Immunization.   Questions or concerns regarding consultations or procedures which the PT has had in the interim are  addressed. The PT denies any adverse reactions to current medications since the last visit.  There are no new concerns.  There are no specific complaints  Denies polyuria, polydipsia, blurred vision , or hypoglycemic episodes.   ROS Denies recent fever or chills. Denies sinus pressure, nasal congestion, ear pain or sore throat. Denies chest congestion, productive cough or wheezing. Denies chest pains, palpitations and leg swelling Denies abdominal pain, nausea, vomiting,diarrhea or constipation.   Denies dysuria, frequency, hesitancy or incontinence. Denies uncontrolled joint pain, swelling and limitation in mobility. Denies headaches, seizures, numbness, or tingling. Denies depression, anxiety or insomnia. Denies skin break down or rash.   PE  BP 130/80   Pulse 94   Resp 16   Ht 5\' 6"  (1.676 m)   Wt 246 lb (111.6 kg)   SpO2 95%   BMI 39.71 kg/m    Patient alert and oriented and in no cardiopulmonary distress.  HEENT: No facial asymmetry, EOMI,   oropharynx pink and moist.  Neck supple no JVD, no mass.  Chest: Clear to auscultation bilaterally.  CVS: S1, S2 no murmurs, no S3.Regular rate.  ABD: Soft non tender.   Ext: No edema  MS: Adequate ROM spine, shoulders, hips and reduced in knees.  Skin: Intact, no ulcerations or rash noted.  Psych: Good eye contact, normal affect. Memory intact not anxious or depressed appearing.  CNS: CN 2-12 intact, power,  normal throughout.no focal deficits noted.   Assessment & Plan  Diabetes mellitus, insulin  dependent (IDDM), uncontrolled ([place]) Ms. Choy is reminded of the importance of commitment to daily physical activity for 30 minutes or more, as able and the need to limit carbohydrate intake to 30 to 60 grams per meal to help with blood sugar control.   The need to take medication as prescribed, test blood sugar as directed, and to call between visits if there is a concern that blood sugar is uncontrolled is also discussed.   Ms. Parillo is reminded of the importance of daily foot exam, annual eye examination, and good blood sugar, blood pressure and cholesterol control. Improved and currently at goal, she is appauded on this and encouraged to be diligent with food choice  Diabetic Labs Latest Ref Rng & Units 12/09/2017 09/09/2017 08/22/2017 03/13/2017 03/04/2017  HbA1c <5.7 % of total Hgb 7.5(H) 8.6(H) - - 7.3(H)  Microalbumin Not Estab. ug/mL - - - 40.9(H) -  Micro/Creat Ratio 0.0 - 30.0 mg/g creat - - - 16.1 -  Chol <200 mg/dL - 169 - - 177  HDL >50 mg/dL - 47(L) - - 53  Calc LDL mg/dL (calc) - 103(H) - - 105(H)  Triglycerides <150 mg/dL - 97 - - 97  Creatinine 0.50 - 0.99 mg/dL 0.94 0.89 0.96 - -   BP/Weight 12/18/2017 12/04/2017 11/28/2017 11/25/2017 11/10/2017 10/29/2017 4/65/6812  Systolic BP 751 - 700 174 944 967 591  Diastolic BP 80 - 84 80 80 75 80  Wt. (Lbs) 246 246 - 245 246.08 - 248  BMI 39.71 39.71 - 38.37 38.54 - 41.27   Foot/eye exam  completion dates Latest Ref Rng & Units 05/21/2017 12/11/2016  Eye Exam No Retinopathy - No Retinopathy  Foot exam Order - - -  Foot Form Completion - Done -         Morbid obesity Unchanged. Patient re-educated about  the importance of commitment to a  minimum of 150 minutes of exercise per week.  The importance of healthy food choices with portion control discussed. Encouraged to start a food diary, count calories and to consider  joining a support group. Sample diet sheets offered. Goals set by the patient for the next several months.     Weight /BMI 12/18/2017 12/04/2017 11/25/2017  WEIGHT 246 lb 246 lb 245 lb  HEIGHT 5\' 6"  5\' 6"  5\' 7"   BMI 39.71 kg/m2 39.71 kg/m2 38.37 kg/m2      Hyperlipidemia LDL goal <100 Hyperlipidemia:Low fat diet discussed and encouraged.   Lipid Panel  Lab Results  Component Value Date   CHOL 169 09/09/2017   HDL 47 (L) 09/09/2017   LDLCALC 103 (H) 09/09/2017   TRIG 97 09/09/2017   CHOLHDL 3.6 09/09/2017  Not at goal, needs to reduce fat intake Updated lab needed at/ before next visit.      Essential hypertension Controlled, no change in medication DASH diet and commitment to daily physical activity for a minimum of 30 minutes discussed and encouraged, as a part of hypertension management. The importance of attaining a healthy weight is also discussed.  BP/Weight 12/18/2017 12/04/2017 11/28/2017 11/25/2017 11/10/2017 10/29/2017 12/29/1113  Systolic BP 520 - 802 233 612 244 975  Diastolic BP 80 - 84 80 80 75 80  Wt. (Lbs) 246 246 - 245 246.08 - 248  BMI 39.71 39.71 - 38.37 38.54 - 41.27

## 2017-12-21 NOTE — Assessment & Plan Note (Signed)
Hyperlipidemia:Low fat diet discussed and encouraged.   Lipid Panel  Lab Results  Component Value Date   CHOL 169 09/09/2017   HDL 47 (L) 09/09/2017   LDLCALC 103 (H) 09/09/2017   TRIG 97 09/09/2017   CHOLHDL 3.6 09/09/2017  Not at goal, needs to reduce fat intake Updated lab needed at/ before next visit.

## 2017-12-21 NOTE — Assessment & Plan Note (Signed)
Helen Mahoney is reminded of the importance of commitment to daily physical activity for 30 minutes or more, as able and the need to limit carbohydrate intake to 30 to 60 grams per meal to help with blood sugar control.   The need to take medication as prescribed, test blood sugar as directed, and to call between visits if there is a concern that blood sugar is uncontrolled is also discussed.   Helen Mahoney is reminded of the importance of daily foot exam, annual eye examination, and good blood sugar, blood pressure and cholesterol control. Improved and currently at goal, she is appauded on this and encouraged to be diligent with food choice  Diabetic Labs Latest Ref Rng & Units 12/09/2017 09/09/2017 08/22/2017 03/13/2017 03/04/2017  HbA1c <5.7 % of total Hgb 7.5(H) 8.6(H) - - 7.3(H)  Microalbumin Not Estab. ug/mL - - - 40.9(H) -  Micro/Creat Ratio 0.0 - 30.0 mg/g creat - - - 16.1 -  Chol <200 mg/dL - 169 - - 177  HDL >50 mg/dL - 47(L) - - 53  Calc LDL mg/dL (calc) - 103(H) - - 105(H)  Triglycerides <150 mg/dL - 97 - - 97  Creatinine 0.50 - 0.99 mg/dL 0.94 0.89 0.96 - -   BP/Weight 12/18/2017 12/04/2017 11/28/2017 11/25/2017 11/10/2017 10/29/2017 8/42/1031  Systolic BP 281 - 188 677 373 668 159  Diastolic BP 80 - 84 80 80 75 80  Wt. (Lbs) 246 246 - 245 246.08 - 248  BMI 39.71 39.71 - 38.37 38.54 - 41.27   Foot/eye exam completion dates Latest Ref Rng & Units 05/21/2017 12/11/2016  Eye Exam No Retinopathy - No Retinopathy  Foot exam Order - - -  Foot Form Completion - Done -

## 2017-12-22 ENCOUNTER — Other Ambulatory Visit: Payer: Self-pay | Admitting: Family Medicine

## 2017-12-26 ENCOUNTER — Other Ambulatory Visit (HOSPITAL_COMMUNITY): Payer: Self-pay | Admitting: Internal Medicine

## 2017-12-26 ENCOUNTER — Ambulatory Visit (HOSPITAL_COMMUNITY): Payer: Medicare HMO

## 2017-12-30 ENCOUNTER — Inpatient Hospital Stay (HOSPITAL_COMMUNITY): Payer: Medicare HMO | Attending: Internal Medicine

## 2017-12-30 VITALS — BP 142/85 | HR 85 | Temp 97.8°F | Resp 18

## 2017-12-30 DIAGNOSIS — E538 Deficiency of other specified B group vitamins: Secondary | ICD-10-CM | POA: Insufficient documentation

## 2017-12-30 MED ORDER — CYANOCOBALAMIN 1000 MCG/ML IJ SOLN
INTRAMUSCULAR | Status: AC
  Filled 2017-12-30: qty 1

## 2017-12-30 MED ORDER — CYANOCOBALAMIN 1000 MCG/ML IJ SOLN
1000.0000 ug | Freq: Once | INTRAMUSCULAR | Status: AC
  Administered 2017-12-30: 1000 ug via INTRAMUSCULAR

## 2017-12-31 ENCOUNTER — Encounter (HOSPITAL_COMMUNITY): Payer: Self-pay

## 2017-12-31 ENCOUNTER — Other Ambulatory Visit: Payer: Self-pay

## 2017-12-31 NOTE — Progress Notes (Signed)
Helen Mahoney presents today for injection per MD orders. B12 1,000 mcg administered SQ in left Upper Arm. Administration without incident. Patient tolerated well.   Vitals stable and discharged home from clinic ambulatory. Follow up as scheduled.

## 2017-12-31 NOTE — Patient Instructions (Signed)
[facility] at [facility] Discharge Instructions     Thank you for choosing [facility] at [facility] to provide your oncology and hematology care.  To afford each patient quality time with our provider, please arrive at least 15 minutes before your scheduled appointment time.   If you have a lab appointment with the Cancer Center please come in thru the  Main Entrance and check in at the main information desk  You need to re-schedule your appointment should you arrive 10 or more minutes late.  We strive to give you quality time with our providers, and arriving late affects you and other patients whose appointments are after yours.  Also, if you no show three or more times for appointments you may be dismissed from the clinic at the providers discretion.     Again, thank you for choosing [facility].  Our hope is that these requests will decrease the amount of time that you wait before being seen by our physicians.       _____________________________________________________________  Should you have questions after your visit to [facility], please contact our office at [phone] between the hours of 8:00 a.m. and 4:30 p.m.  Voicemails left after 4:00 p.m. will not be returned until the following business day.  For prescription refill requests, have your pharmacy contact our office and allow 72 hours.    Cancer Center Support Programs:   > Cancer Support Group  2nd Tuesday of the month 1pm-2pm, Journey Room    

## 2018-01-19 ENCOUNTER — Other Ambulatory Visit: Payer: Self-pay | Admitting: Family Medicine

## 2018-01-26 ENCOUNTER — Inpatient Hospital Stay (HOSPITAL_COMMUNITY): Payer: Medicare HMO

## 2018-01-26 ENCOUNTER — Encounter (HOSPITAL_COMMUNITY): Payer: Self-pay

## 2018-01-26 VITALS — BP 115/67 | HR 89 | Temp 98.2°F | Resp 18

## 2018-01-26 DIAGNOSIS — E538 Deficiency of other specified B group vitamins: Secondary | ICD-10-CM | POA: Diagnosis not present

## 2018-01-26 MED ORDER — CYANOCOBALAMIN 1000 MCG/ML IJ SOLN
1000.0000 ug | Freq: Once | INTRAMUSCULAR | Status: AC
  Administered 2018-01-26: 1000 ug via INTRAMUSCULAR

## 2018-01-26 MED ORDER — CYANOCOBALAMIN 1000 MCG/ML IJ SOLN
INTRAMUSCULAR | Status: AC
  Filled 2018-01-26: qty 1

## 2018-01-26 NOTE — Patient Instructions (Signed)
[facility] at [facility]  Discharge Instructions:  You received a b12 shot today.  _______________________________________________________________  Thank you for choosing [facility] at [facility] to provide your oncology and hematology care.  To afford each patient quality time with our providers, please arrive at least 15 minutes before your scheduled appointment.  You need to re-schedule your appointment if you arrive 10 or more minutes late.  We strive to give you quality time with our providers, and arriving late affects you and other patients whose appointments are after yours.  Also, if you no show three or more times for appointments you may be dismissed from the clinic.  Again, thank you for choosing [facility] at [facility]. Our hope is that these requests will allow you access to exceptional care and in a timely manner. _______________________________________________________________  If you have questions after your visit, please contact our office at [phone] between the hours of 8:30 a.m. and 5:00 p.m. Voicemails left after 4:30 p.m. will not be returned until the following business day. _______________________________________________________________  For prescription refill requests, have your pharmacy contact our office. _______________________________________________________________  Recommendations made by the consultant and any test results will be sent to your referring physician. _______________________________________________________________ 

## 2018-01-26 NOTE — Progress Notes (Signed)
Patient tolerated b12 shot with no complaints voiced.  Site clean and dry with no bruising or swelling noted at site.  Band aid applied.  VSS with discharge and left ambulatory with no s/s of distress noted.

## 2018-01-29 ENCOUNTER — Encounter: Payer: Self-pay | Admitting: Nutrition

## 2018-01-29 ENCOUNTER — Encounter: Payer: Medicare HMO | Attending: Family Medicine | Admitting: Nutrition

## 2018-01-29 VITALS — Ht 66.0 in | Wt 245.0 lb

## 2018-01-29 DIAGNOSIS — E10649 Type 1 diabetes mellitus with hypoglycemia without coma: Secondary | ICD-10-CM | POA: Diagnosis not present

## 2018-01-29 DIAGNOSIS — E1165 Type 2 diabetes mellitus with hyperglycemia: Secondary | ICD-10-CM

## 2018-01-29 DIAGNOSIS — Z713 Dietary counseling and surveillance: Secondary | ICD-10-CM | POA: Insufficient documentation

## 2018-01-29 DIAGNOSIS — E118 Type 2 diabetes mellitus with unspecified complications: Secondary | ICD-10-CM

## 2018-01-29 DIAGNOSIS — E669 Obesity, unspecified: Secondary | ICD-10-CM

## 2018-01-29 DIAGNOSIS — IMO0002 Reserved for concepts with insufficient information to code with codable children: Secondary | ICD-10-CM

## 2018-01-29 NOTE — Progress Notes (Signed)
Medical Nutrition Therapy:  Appt start time: 930 end time:  1000  Assessment:  Primary concerns today: Diabetes Type 2, CHanges made: Has been walking. Husband had heart surgery and so been trying to eat healthier.Marland Kitchen  FBS 121 mg A1C down from 8.6%  to 7.5%. BS log shows she has had a few lows in am 60's. BS are highest at night. Tends to eat late due to carring for granchild til 8-9 pm.  Lantus 45 units, Metformi 1000 mg BID, Glipizide 10 mg BID.  Lives with husband. He does most of the cooking and shopping.  Changed eating habit with more fresh fruits amd vegetables. Engaged into making better food choices and being more consist with medications and testing. Still drinking diet sodas and would benefit form stopping those. Not active outside of taking care of her granddaughter. Vitals with BMI 01/29/2018  Height 5\' 6"   Weight 245 lbs  BMI 34.19  Systolic   Diastolic   Pulse   Respirations    Lab Results  Component Value Date   HGBA1C 7.5 (H) 12/09/2017   CMP Latest Ref Rng & Units 12/09/2017 09/09/2017 08/22/2017  Glucose 65 - 99 mg/dL 174(H) 68 264(H)  BUN 7 - 25 mg/dL 11 13 16   Creatinine 0.50 - 0.99 mg/dL 0.94 0.89 0.96  Sodium 135 - 146 mmol/L 139 141 135  Potassium 3.5 - 5.3 mmol/L 4.1 4.1 4.4  Chloride 98 - 110 mmol/L 101 103 98(L)  CO2 20 - 32 mmol/L 29 30 22   Calcium 8.6 - 10.4 mg/dL 9.1 9.1 9.1  Total Protein 6.1 - 8.1 g/dL - 6.7 7.2  Total Bilirubin 0.2 - 1.2 mg/dL - 0.4 0.6  Alkaline Phos 38 - 126 U/L - - 120  AST 10 - 35 U/L - 19 22  ALT 6 - 29 U/L - 17 20   Lipid Panel     Component Value Date/Time   CHOL 169 09/09/2017 0955   TRIG 97 09/09/2017 0955   HDL 47 (L) 09/09/2017 0955   CHOLHDL 3.6 09/09/2017 0955   VLDL 29 11/04/2016 1033   LDLCALC 103 (H) 09/09/2017 0955     Preferred Learning Style:  Auditory  No preference indicated   Learning Readiness:  Ready  Change in progress   MEDICATIONS:   DIETARY INTAKE: B) oatmeal and banana and  bacon, cranberry juice Water, Coke zero L) FIsh fried  Sandwich, Diet coke D) Watermelon and pb and gram crackers  24-hr recall:   Usual physical activity: ADL  Estimated energy needs: 1200  calories 135 g carbohydrates 90 g protein 33 g fat  Progress Towards Goal(s):  In progress.   Nutritional Diagnosis:  NB-1.1 Food and nutrition-related knowledge deficit As related to Diabetes.  As evidenced by A1C 8.6%.    Intervention: Nutrition and Diabetes education provided on My Plate, CHO counting, meal planning, portion sizes, timing of meals, avoiding snacks between meals unless having a low blood sugar, target ranges for A1C and blood sugars, signs/symptoms and treatment of hyper/hypoglycemia, monitoring blood sugars, taking medications as prescribed, benefits of exercising 30 minutes per day and prevention of complications of DM. Marland Kitchen Goals Great job!! 1 Increase walking 15 minutes 3 times per week. 2. Eat dinner before  7 pm 3. Drink more water and cut out diet sodas Lose 2 lb per month.  Teaching Method Utilized: Visual Auditory Hands on  Handouts given during visit include:  THe Plate Method   Meal Plan Card  Barriers to learning/adherence to lifestyle  change: none  Demonstrated degree of understanding via:  Teach Back   Monitoring/Evaluation:  Dietary intake, exercise, meal planing, and body weight in 4 month(s). Recommend to stop Glipizide , the risks of hypoglycemia and weight gain outweigh the benefits.Marland Kitchen

## 2018-01-29 NOTE — Patient Instructions (Addendum)
Goals Great job!! 1 Increase walking 15 minutes 3 times per week. 2. Eat dinner before  7 pm 3. Drink more water and cut out diet sodas Lose 2 lb per month.

## 2018-02-12 ENCOUNTER — Other Ambulatory Visit: Payer: Self-pay | Admitting: Family Medicine

## 2018-02-17 DIAGNOSIS — Z0100 Encounter for examination of eyes and vision without abnormal findings: Secondary | ICD-10-CM | POA: Diagnosis not present

## 2018-02-17 DIAGNOSIS — H25813 Combined forms of age-related cataract, bilateral: Secondary | ICD-10-CM | POA: Diagnosis not present

## 2018-02-17 DIAGNOSIS — H5210 Myopia, unspecified eye: Secondary | ICD-10-CM | POA: Diagnosis not present

## 2018-02-18 ENCOUNTER — Inpatient Hospital Stay (HOSPITAL_COMMUNITY): Payer: Medicare HMO | Attending: Hematology

## 2018-02-18 ENCOUNTER — Other Ambulatory Visit: Payer: Self-pay | Admitting: Family Medicine

## 2018-02-18 DIAGNOSIS — D563 Thalassemia minor: Secondary | ICD-10-CM | POA: Insufficient documentation

## 2018-02-18 DIAGNOSIS — D509 Iron deficiency anemia, unspecified: Secondary | ICD-10-CM | POA: Insufficient documentation

## 2018-02-18 DIAGNOSIS — E119 Type 2 diabetes mellitus without complications: Secondary | ICD-10-CM | POA: Diagnosis not present

## 2018-02-18 DIAGNOSIS — D508 Other iron deficiency anemias: Secondary | ICD-10-CM

## 2018-02-18 DIAGNOSIS — Z8249 Family history of ischemic heart disease and other diseases of the circulatory system: Secondary | ICD-10-CM | POA: Insufficient documentation

## 2018-02-18 DIAGNOSIS — Z7982 Long term (current) use of aspirin: Secondary | ICD-10-CM | POA: Diagnosis not present

## 2018-02-18 DIAGNOSIS — E538 Deficiency of other specified B group vitamins: Secondary | ICD-10-CM | POA: Diagnosis not present

## 2018-02-18 DIAGNOSIS — Z7984 Long term (current) use of oral hypoglycemic drugs: Secondary | ICD-10-CM | POA: Diagnosis not present

## 2018-02-18 DIAGNOSIS — Z79899 Other long term (current) drug therapy: Secondary | ICD-10-CM | POA: Insufficient documentation

## 2018-02-18 DIAGNOSIS — I10 Essential (primary) hypertension: Secondary | ICD-10-CM | POA: Insufficient documentation

## 2018-02-18 DIAGNOSIS — Z794 Long term (current) use of insulin: Secondary | ICD-10-CM | POA: Diagnosis not present

## 2018-02-18 LAB — COMPREHENSIVE METABOLIC PANEL
ALBUMIN: 3.5 g/dL (ref 3.5–5.0)
ALK PHOS: 124 U/L (ref 38–126)
ALT: 21 U/L (ref 0–44)
ANION GAP: 7 (ref 5–15)
AST: 20 U/L (ref 15–41)
BUN: 13 mg/dL (ref 8–23)
CALCIUM: 9 mg/dL (ref 8.9–10.3)
CHLORIDE: 103 mmol/L (ref 98–111)
CO2: 28 mmol/L (ref 22–32)
Creatinine, Ser: 0.96 mg/dL (ref 0.44–1.00)
GFR calc Af Amer: >60 mL/min (ref >60)
GFR calc non Af Amer: 60 mL/min — ABNORMAL LOW (ref >60)
Glucose, Bld: 134 mg/dL — ABNORMAL HIGH (ref 70–99)
Potassium: 4.4 mmol/L (ref 3.5–5.1)
SODIUM: 138 mmol/L (ref 135–145)
Total Bilirubin: 0.6 mg/dL (ref 0.3–1.2)
Total Protein: 7.9 g/dL (ref 6.5–8.1)

## 2018-02-18 LAB — CBC WITH DIFFERENTIAL/PLATELET
ABS IMMATURE GRANULOCYTES: 0.08 10*3/uL — AB (ref 0.00–0.07)
BASOS ABS: 0.1 10*3/uL (ref 0.0–0.1)
Basophils Relative: 1 %
EOS PCT: 6 %
Eosinophils Absolute: 0.4 10*3/uL (ref 0.0–0.5)
HCT: 37.2 % (ref 36.0–46.0)
HEMOGLOBIN: 10.7 g/dL — AB (ref 12.0–15.0)
Immature Granulocytes: 1 %
LYMPHS PCT: 19 %
Lymphs Abs: 1.5 10*3/uL (ref 0.7–4.0)
MCH: 20.5 pg — ABNORMAL LOW (ref 26.0–34.0)
MCHC: 28.8 g/dL — AB (ref 30.0–36.0)
MCV: 71.4 fL — ABNORMAL LOW (ref 80.0–100.0)
Monocytes Absolute: 0.6 10*3/uL (ref 0.1–1.0)
Monocytes Relative: 7 %
Neutro Abs: 5.3 10*3/uL (ref 1.7–7.7)
Neutrophils Relative %: 66 %
Platelets: 360 10*3/uL (ref 150–400)
RBC: 5.21 MIL/uL — AB (ref 3.87–5.11)
RDW: 17 % — ABNORMAL HIGH (ref 11.5–15.5)
WBC: 8 10*3/uL (ref 4.0–10.5)
nRBC: 0 % (ref 0.0–0.2)

## 2018-02-18 LAB — LACTATE DEHYDROGENASE: LDH: 144 U/L (ref 98–192)

## 2018-02-19 ENCOUNTER — Ambulatory Visit (HOSPITAL_COMMUNITY)
Admission: RE | Admit: 2018-02-19 | Discharge: 2018-02-19 | Disposition: A | Discharge status: Home / Self Care | Payer: Medicare HMO | Source: Ambulatory Visit | Attending: Family Medicine | Admitting: Family Medicine

## 2018-02-19 DIAGNOSIS — Z1231 Encounter for screening mammogram for malignant neoplasm of breast: Secondary | ICD-10-CM | POA: Insufficient documentation

## 2018-02-25 ENCOUNTER — Encounter (HOSPITAL_COMMUNITY): Payer: Self-pay

## 2018-02-25 ENCOUNTER — Ambulatory Visit (HOSPITAL_COMMUNITY): Payer: Medicare HMO

## 2018-02-25 ENCOUNTER — Inpatient Hospital Stay (HOSPITAL_COMMUNITY): Payer: Medicare HMO

## 2018-02-25 ENCOUNTER — Ambulatory Visit (HOSPITAL_COMMUNITY): Payer: Medicare HMO | Admitting: Internal Medicine

## 2018-02-25 ENCOUNTER — Inpatient Hospital Stay (HOSPITAL_BASED_OUTPATIENT_CLINIC_OR_DEPARTMENT_OTHER): Payer: Medicare HMO | Admitting: Internal Medicine

## 2018-02-25 ENCOUNTER — Other Ambulatory Visit: Payer: Self-pay

## 2018-02-25 VITALS — BP 156/82 | HR 91 | Temp 98.4°F | Resp 20 | Wt 243.1 lb

## 2018-02-25 DIAGNOSIS — E538 Deficiency of other specified B group vitamins: Secondary | ICD-10-CM

## 2018-02-25 DIAGNOSIS — D509 Iron deficiency anemia, unspecified: Secondary | ICD-10-CM | POA: Diagnosis not present

## 2018-02-25 DIAGNOSIS — Z79899 Other long term (current) drug therapy: Secondary | ICD-10-CM | POA: Diagnosis not present

## 2018-02-25 DIAGNOSIS — Z8249 Family history of ischemic heart disease and other diseases of the circulatory system: Secondary | ICD-10-CM

## 2018-02-25 DIAGNOSIS — D563 Thalassemia minor: Secondary | ICD-10-CM

## 2018-02-25 DIAGNOSIS — Z7982 Long term (current) use of aspirin: Secondary | ICD-10-CM

## 2018-02-25 DIAGNOSIS — I10 Essential (primary) hypertension: Secondary | ICD-10-CM | POA: Diagnosis not present

## 2018-02-25 DIAGNOSIS — E119 Type 2 diabetes mellitus without complications: Secondary | ICD-10-CM | POA: Diagnosis not present

## 2018-02-25 DIAGNOSIS — Z7984 Long term (current) use of oral hypoglycemic drugs: Secondary | ICD-10-CM

## 2018-02-25 DIAGNOSIS — D508 Other iron deficiency anemias: Secondary | ICD-10-CM

## 2018-02-25 DIAGNOSIS — Z794 Long term (current) use of insulin: Secondary | ICD-10-CM

## 2018-02-25 MED ORDER — CYANOCOBALAMIN 1000 MCG/ML IJ SOLN
INTRAMUSCULAR | Status: AC
  Filled 2018-02-25: qty 1

## 2018-02-25 MED ORDER — CYANOCOBALAMIN 1000 MCG/ML IJ SOLN
1000.0000 ug | Freq: Once | INTRAMUSCULAR | Status: AC
  Administered 2018-02-25: 1000 ug via INTRAMUSCULAR

## 2018-02-25 NOTE — Patient Instructions (Signed)
[facility] at [facility] Discharge Instructions  Received Vit B12 injection today. Follow-up as scheduled. Call clinic for any questions or concerns   Thank you for choosing [facility] at [facility] to provide your oncology and hematology care.  To afford each patient quality time with our provider, please arrive at least 15 minutes before your scheduled appointment time.   If you have a lab appointment with the Cancer Center please come in thru the  Main Entrance and check in at the main information desk  You need to re-schedule your appointment should you arrive 10 or more minutes late.  We strive to give you quality time with our providers, and arriving late affects you and other patients whose appointments are after yours.  Also, if you no show three or more times for appointments you may be dismissed from the clinic at the providers discretion.     Again, thank you for choosing [facility].  Our hope is that these requests will decrease the amount of time that you wait before being seen by our physicians.       _____________________________________________________________  Should you have questions after your visit to  Cancer Center, please contact our office at [phone] between the hours of 8:00 a.m. and 4:30 p.m.  Voicemails left after 4:00 p.m. will not be returned until the following business day.  For prescription refill requests, have your pharmacy contact our office and allow 72 hours.    Cancer Center Support Programs:   > Cancer Support Group  2nd Tuesday of the month 1pm-2pm, Journey Room   

## 2018-02-25 NOTE — Progress Notes (Signed)
Diagnosis B12 deficiency - Plan: CBC with Differential/Platelet, Comprehensive metabolic panel, Lactate dehydrogenase, Ferritin, Vitamin B12, Methylmalonic acid, serum  Other iron deficiency anemia - Plan: CBC with Differential/Platelet, Comprehensive metabolic panel, Lactate dehydrogenase, Ferritin, Vitamin B12, Methylmalonic acid, serum  Staging Cancer Staging No matching staging information was found for the patient.  Assessment and Plan:  1.  Microcytic Anemia longstanding dating back to 2010. She has undergone colonoscopy 05/15/2015 with redundant L colon, diverticulosis, small internal hemorrhoids.  EGD 05/14/2014 with gastric polyps, GE junction stricture.  Givens Capsule 06/02/2015 OCCASIONAL EROSION IN DUODENUM No masses, ULCERS, or AVMs SEEN. OCCASIONAL LYMPHANGECTASIA.    Pt had labs done 02/18/2018 that was reviewed and showed WBC 8 HB 10.7 and plts 360,000.  Chemistries WNL with K+ 4.4 Cr 0.96 and normal LFTS.  Hb is improved at 10.7.  Pt will continue monthly B12 and will be seen for follow-up in 07/2018 with labs.    2.  B12 deficiency.  Pt should continue monthly B12  Will repeat B12 and MMA labs in 07/2018.  HB improved at 10.7.    3.  Alpha thalassemia trait.  She had alpha thalassemia genotype study done 08/17/2015.  This is also an explanation of persistent microcytosis.  MCV 71 on labs done 02/18/2018.    4.  Hypertension.  Blood pressure 156/82.  Follow-up with PCP.  5.  Health maintenance.  Pt had screening mammogram done 02/19/2018 that was reviewed and was negative.  Pt should have repeat imaging in 01/2019.     Current Status: Patient is seen today for follow-up.  She is here to go over labs and for B12 injection.   Problem List Patient Active Problem List   Diagnosis Date Noted  . Osteoarthritis of right knee [M17.11] 07/04/2015  . B12 deficiency [E53.8] 06/16/2015  . Alpha-0- thalassemia trait/carrier [D56.3] 05/18/2015  . FH: colon cancer [Z80.0] 04/25/2015  .  Vitamin D deficiency [E55.9] 10/18/2014  . IDA (iron deficiency anemia) [D50.9] 10/18/2014  . Diabetes mellitus, insulin dependent (IDDM), uncontrolled ([place]) [E11.65, Z79.4] 08/20/2007  . Hyperlipidemia LDL goal <100 [E78.5] 08/20/2007  . Morbid obesity ([place]) [E66.01] 08/20/2007  . Essential hypertension [I10] 08/20/2007    Past Medical History Past Medical History:  Diagnosis Date  . 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 B12 deficiency 06/16/2015  . Colon polyps   . Diabetes mellitus, type 2 ([place])   . Hyperlipidemia   . Hypertension   . Microcytic anemia 05/18/2015   2013: TCS/EGD 2017: TCS/EGD HYPERPLASTIC GASTRIC POLYPS, LYMPHOCYTIC GASTRITIS   . Obesity     Past Surgical History Past Surgical History:  Procedure Laterality Date  . bilateral tubal ligation  1979  . CHOLECYSTECTOMY  2001   ACUTE CHOLECYSTITIS/GALLSTONES  . COLONOSCOPY  05/14/2011   Dr. Oneida Alar: sessile polyp in sigmoid colon, internal hemorrhoids, hyerplastic polyps  . COLONOSCOPY N/A 05/15/2015   Procedure: COLONOSCOPY;  Surgeon: Danie Binder, MD;  [facility];  Service: Endoscopy;  Laterality: N/A;  0830  . COLONOSCOPY W/ POLYPECTOMY  NOV 2008 MJ ANEMIA   POLYP NO RETRIEVED  . COLONOSCOPY W/ POLYPECTOMY  2006 DR. SMTH   POLYP?  . ESOPHAGOGASTRODUODENOSCOPY  05/14/2011   Dr. Oneida Alar: sessile polyps in the cardia, mild gastritis. Chronic duodenitis consistent with peptic duodenitis, chronic active H.pylori gastritis.   Marland Kitchen ESOPHAGOGASTRODUODENOSCOPY N/A 05/15/2015   Procedure: ESOPHAGOGASTRODUODENOSCOPY (EGD);  Surgeon: Danie Binder, MD;  [facility];  Service: Endoscopy;  Laterality: N/A;  . GIVENS CAPSULE STUDY N/A 06/02/2015   Procedure: GIVENS CAPSULE STUDY;  Surgeon: Danie Binder, MD;  Location: [facility];  Service: Endoscopy;  Laterality: N/A;  0700  . TUBAL LIGATION    . UPPER  GASTROINTESTINAL ENDOSCOPY  NOV 2008 MJ ANEMIA   NL EXAM, urease neg    Family History Family History  Problem Relation Age of Onset  . Heart disease Mother   . Diabetes Mother   . Hypertension Mother   . Stroke Mother   . Breast cancer Mother   . Diabetes Father   . Heart attack Father   . Kidney disease Father   . Hypertension Sister   . Diabetes Sister   . Diabetes Sister   . Mental illness Sister   . Hypertension Sister   . Hepatitis C Sister   . Mental illness Sister   . Diabetes Sister   . Hypertension Sister   . Mental illness Sister   . Colon cancer Sister 76  . Diabetes Brother      Social History  reports that she has never smoked. She has never used smokeless tobacco. She reports that she does not drink alcohol or use drugs.  Medications  Current Outpatient Medications:  .  aspirin 81 MG tablet, Take 81 mg by mouth daily., Disp: , Rfl:  .  Calcium Citrate-Vitamin D (CITRACAL PETITES/VITAMIN D) 200-250 MG-UNIT TABS, Take 1 tablet by mouth daily. , Disp: , Rfl:  .  Cholecalciferol (VITAMIN D3) 2000 units TABS, Take 1 tablet by mouth daily., Disp: , Rfl:  .  Ferrous Sulfate (IRON) 325 (65 FE) MG TABS, Take 1 tablet by mouth daily., Disp: 100 each, Rfl: 2 .  glipiZIDE (GLUCOTROL) 10 MG tablet, TAKE 1 TABLET TWICE DAILY BEFORE MEALS, Disp: 180 tablet, Rfl: 1 .  Insulin Glargine (LANTUS SOLOSTAR) 100 UNIT/ML Solostar Pen, INJECT 50 UNITS SUBCUTANEOUSLY ONCE DAILY AT 10 PM, Disp: 15 pen, Rfl: 1 .  metFORMIN (GLUCOPHAGE) 1000 MG tablet, TAKE 1 TABLET TWICE DAILY WITH MEALS, Disp: 180 tablet, Rfl: 1 .  Multiple Vitamin (MULTIVITAMINS PO), Take 1 tablet by mouth daily. , Disp: , Rfl:  .  pravastatin (PRAVACHOL) 80 MG tablet, TAKE 1 TABLET ONE TIME DAILY IN THE EVENING, Disp: 90 tablet, Rfl: 2 .  RELION PEN NEEDLE 31G/8MM 31G X 8 MM MISC, USE 1  ONCE DAILY AS DIRECTED, Disp: 50 each, Rfl: 5 .  TRUE METRIX BLOOD GLUCOSE TEST test strip, TEST THREE TIMES DAILY, Disp: 300  each, Rfl: 1  Allergies Tetanus toxoids and Penicillins  Review of Systems Review of Systems - Oncology ROS negative   Physical Exam  Vitals Wt Readings from Last 3 Encounters:  02/25/18 243 lb 1.6 oz (110.3 kg)  01/29/18 245 lb (111.1 kg)  12/18/17 246 lb (111.6 kg)   Temp Readings from Last 3 Encounters:  02/25/18 98.4 F (36.9 C) (Oral)  01/26/18 98.2 F (36.8 C) (Oral)  12/30/17 97.8 F (36.6 C) (Oral)   BP Readings from Last 3 Encounters:  02/25/18 (!) 156/82  01/26/18 115/67  12/30/17 (!) 142/85   Pulse Readings from Last 3 Encounters:  02/25/18 91  01/26/18 89  12/30/17 85   Constitutional: Well-developed, well-nourished, and in no distress.   HENT: Head: Normocephalic and atraumatic.  Mouth/Throat: No oropharyngeal exudate. Mucosa moist. Eyes: Pupils are equal, round, and reactive to light. Conjunctivae are normal. No scleral icterus.  Neck: Normal range of motion. Neck supple. No JVD  present.  Cardiovascular: Normal rate, regular rhythm and normal heart sounds.  Exam reveals no gallop and no friction rub.   No murmur heard. Pulmonary/Chest: Effort normal and breath sounds normal. No respiratory distress. No wheezes.No rales.  Abdominal: Soft. Bowel sounds are normal. No distension. There is no tenderness. There is no guarding.  Musculoskeletal: No edema or tenderness.  Lymphadenopathy: No cervical, axillary or supraclavicular adenopathy.  Neurological: Alert and oriented to person, place, and time. No cranial nerve deficit.  Skin: Skin is warm and dry. No rash noted. No erythema. No pallor.  Psychiatric: Affect and judgment normal.   Labs No visits with results within 3 Day(s) from this visit.  Latest known visit with results is:  Appointment on 02/18/2018  Component Date Value Ref Range Status  . WBC 02/18/2018 8.0  4.0 - 10.5 K/uL Final  . RBC 02/18/2018 5.21* 3.87 - 5.11 MIL/uL Final  . Hemoglobin 02/18/2018 10.7* 12.0 - 15.0 g/dL Final  . HCT  02/18/2018 37.2  36.0 - 46.0 % Final  . MCV 02/18/2018 71.4* 80.0 - 100.0 fL Final  . MCH 02/18/2018 20.5* 26.0 - 34.0 pg Final  . MCHC 02/18/2018 28.8* 30.0 - 36.0 g/dL Final  . RDW 02/18/2018 17.0* 11.5 - 15.5 % Final  . Platelets 02/18/2018 360  150 - 400 K/uL Final  . nRBC 02/18/2018 0.0  0.0 - 0.2 % Final  . Neutrophils Relative % 02/18/2018 66  % Final  . Neutro Abs 02/18/2018 5.3  1.7 - 7.7 K/uL Final  . Lymphocytes Relative 02/18/2018 19  % Final  . Lymphs Abs 02/18/2018 1.5  0.7 - 4.0 K/uL Final  . Monocytes Relative 02/18/2018 7  % Final  . Monocytes Absolute 02/18/2018 0.6  0.1 - 1.0 K/uL Final  . Eosinophils Relative 02/18/2018 6  % Final  . Eosinophils Absolute 02/18/2018 0.4  0.0 - 0.5 K/uL Final  . Basophils Relative 02/18/2018 1  % Final  . Basophils Absolute 02/18/2018 0.1  0.0 - 0.1 K/uL Final  . Immature Granulocytes 02/18/2018 1  % Final  . Abs Immature Granulocytes 02/18/2018 0.08* 0.00 - 0.07 K/uL Final   Performed at [facility], [address], [place], [place] [address]  . Sodium 02/18/2018 138  135 - 145 mmol/L Final  . Potassium 02/18/2018 4.4  3.5 - 5.1 mmol/L Final  . Chloride 02/18/2018 103  98 - 111 mmol/L Final  . CO2 02/18/2018 28  22 - 32 mmol/L Final  . Glucose, Bld 02/18/2018 134* 70 - 99 mg/dL Final  . BUN 02/18/2018 13  8 - 23 mg/dL Final  . Creatinine, Ser 02/18/2018 0.96  0.44 - 1.00 mg/dL Final  . Calcium 02/18/2018 9.0  8.9 - 10.3 mg/dL Final  . Total Protein 02/18/2018 7.9  6.5 - 8.1 g/dL Final  . Albumin 02/18/2018 3.5  3.5 - 5.0 g/dL Final  . AST 02/18/2018 20  15 - 41 U/L Final  . ALT 02/18/2018 21  0 - 44 U/L Final  . Alkaline Phosphatase 02/18/2018 124  38 - 126 U/L Final  . Total Bilirubin 02/18/2018 0.6  0.3 - 1.2 mg/dL Final  . GFR calc non Af Amer 02/18/2018 60* >60 mL/min Final  . GFR calc Af Amer 02/18/2018 >60  >60 mL/min Final   Comment: (NOTE) The eGFR has been calculated using the CKD EPI equation. This calculation has  not been validated in all clinical situations. eGFR's persistently <60 mL/min signify possible Chronic Kidney Disease.   . Anion gap  02/18/2018 7  5 - 15 Final   Performed at [facility], [address], [place], [place] [address]  . LDH 02/18/2018 144  98 - 192 U/L Final   Performed at [facility], [address], [place], [place] [address]     Pathology Orders Placed This Encounter  Procedures  . CBC with Differential/Platelet    Standing Status:   Future    Standing Expiration Date:   02/26/2020  . Comprehensive metabolic panel    Standing Status:   Future    Standing Expiration Date:   02/26/2020  . Lactate dehydrogenase    Standing Status:   Future    Standing Expiration Date:   02/26/2020  . Ferritin    Standing Status:   Future    Standing Expiration Date:   02/26/2020  . Vitamin B12    Standing Status:   Future    Standing Expiration Date:   02/26/2020  . Methylmalonic acid, serum    Standing Status:   Future    Standing Expiration Date:   02/26/2020       Zoila Shutter MD

## 2018-02-25 NOTE — Patient Instructions (Signed)
[facility] at [facility] Discharge Instructions  Today you saw Dr. Higgs   Thank you for choosing [facility] at [facility] to provide your oncology and hematology care.  To afford each patient quality time with our provider, please arrive at least 15 minutes before your scheduled appointment time.   If you have a lab appointment with the Cancer Center please come in thru the  Main Entrance and check in at the main information desk  You need to re-schedule your appointment should you arrive 10 or more minutes late.  We strive to give you quality time with our providers, and arriving late affects you and other patients whose appointments are after yours.  Also, if you no show three or more times for appointments you may be dismissed from the clinic at the providers discretion.     Again, thank you for choosing [facility].  Our hope is that these requests will decrease the amount of time that you wait before being seen by our physicians.       _____________________________________________________________  Should you have questions after your visit to  Cancer Center, please contact our office at [phone] between the hours of 8:00 a.m. and 4:30 p.m.  Voicemails left after 4:00 p.m. will not be returned until the following business day.  For prescription refill requests, have your pharmacy contact our office and allow 72 hours.    Cancer Center Support Programs:   > Cancer Support Group  2nd Tuesday of the month 1pm-2pm, Journey Room   

## 2018-02-25 NOTE — Progress Notes (Signed)
Helen Mahoney tolerated Vit B12 injection well without complaints or incident. Pt discharged self ambulatory in satisfactory condition 

## 2018-03-02 IMAGING — MG 2D DIGITAL SCREENING BILATERAL MAMMOGRAM WITH CAD AND ADJUNCT TO
6 of 9 series · 6 of 21 positions shown · non-contrast
Comparison: Previous exam(s).

ACR Breast Density Category a: The breast tissue is almost entirely
fatty.

CLINICAL DATA: Screening.

EXAM:
2D DIGITAL SCREENING BILATERAL MAMMOGRAM WITH CAD AND ADJUNCT TOMO

[R CC (1 of 2)]
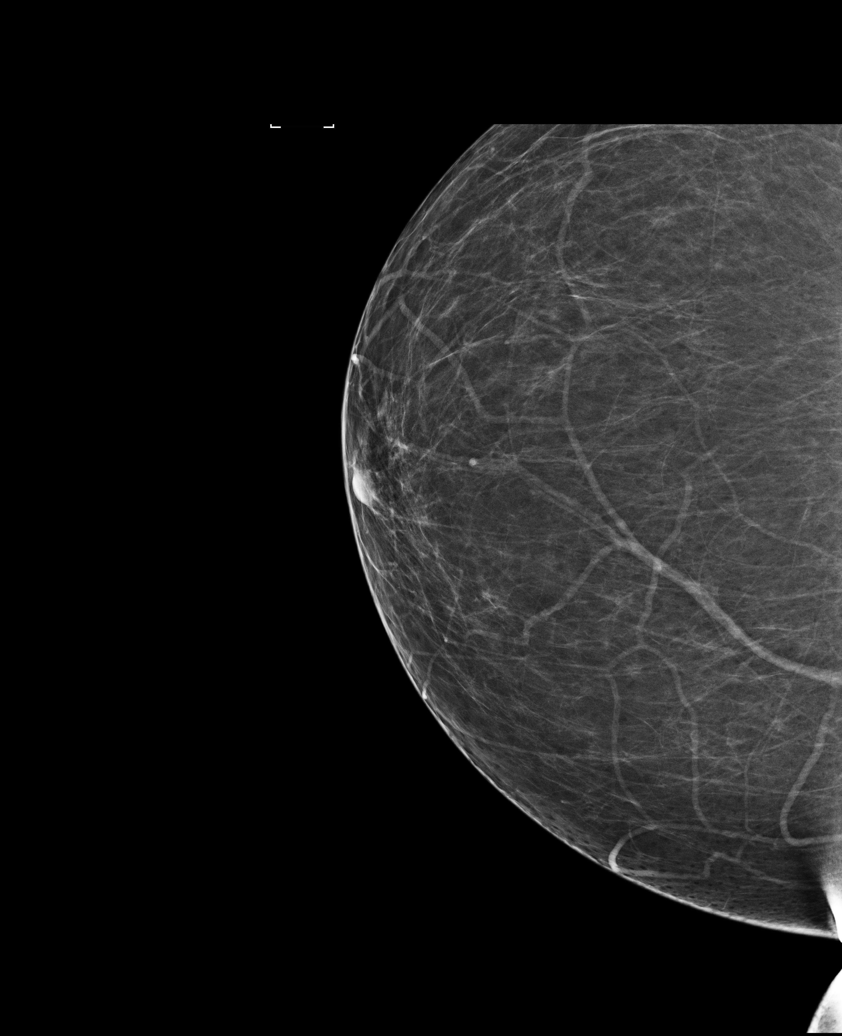

[L CC (1 of 2)]
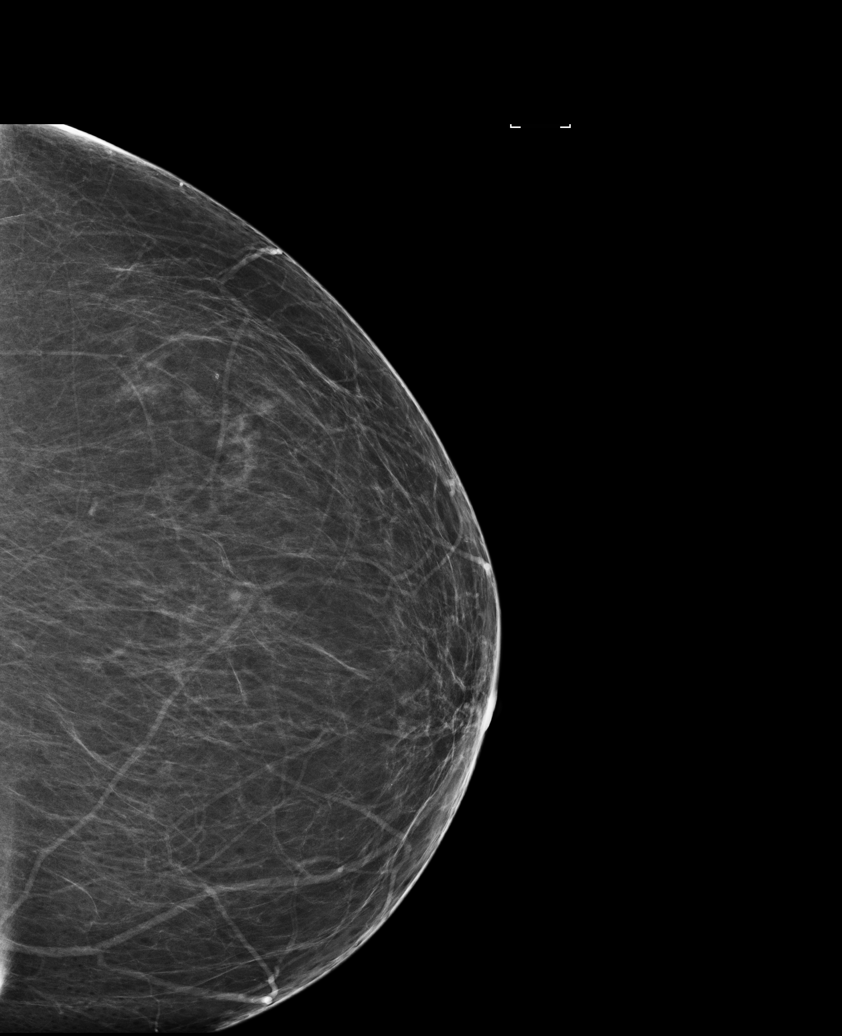

[L CC (2 of 2)]
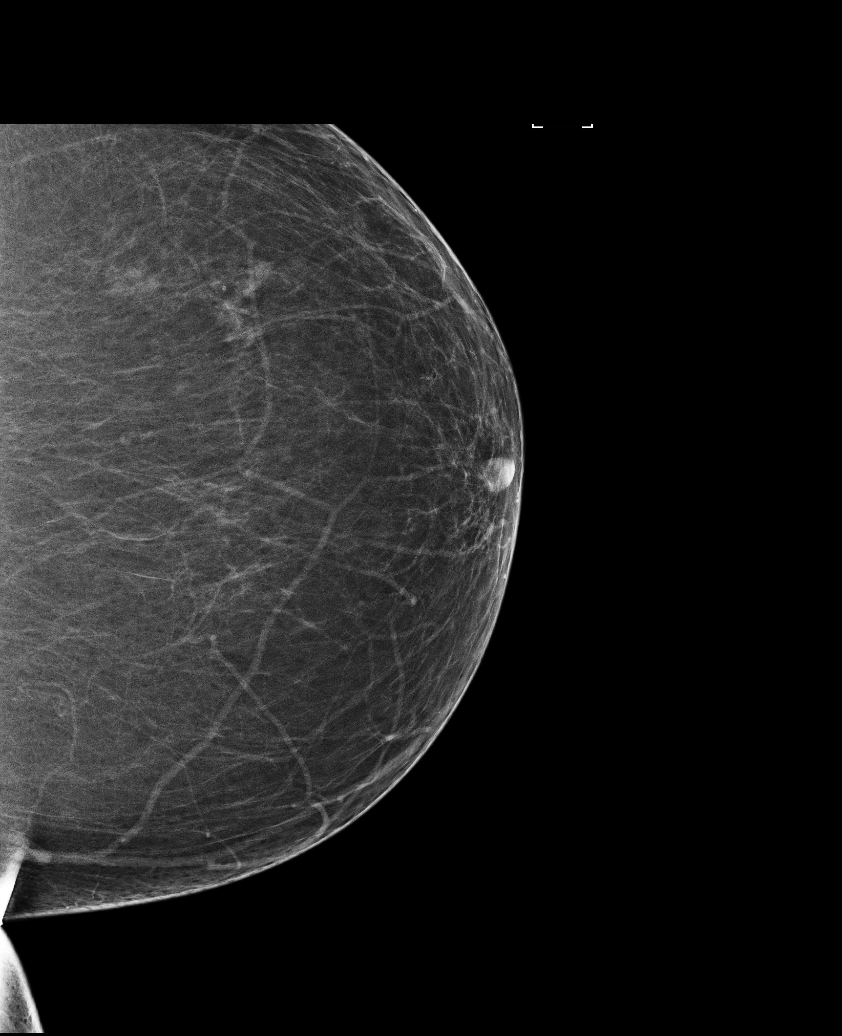

[R MLO]
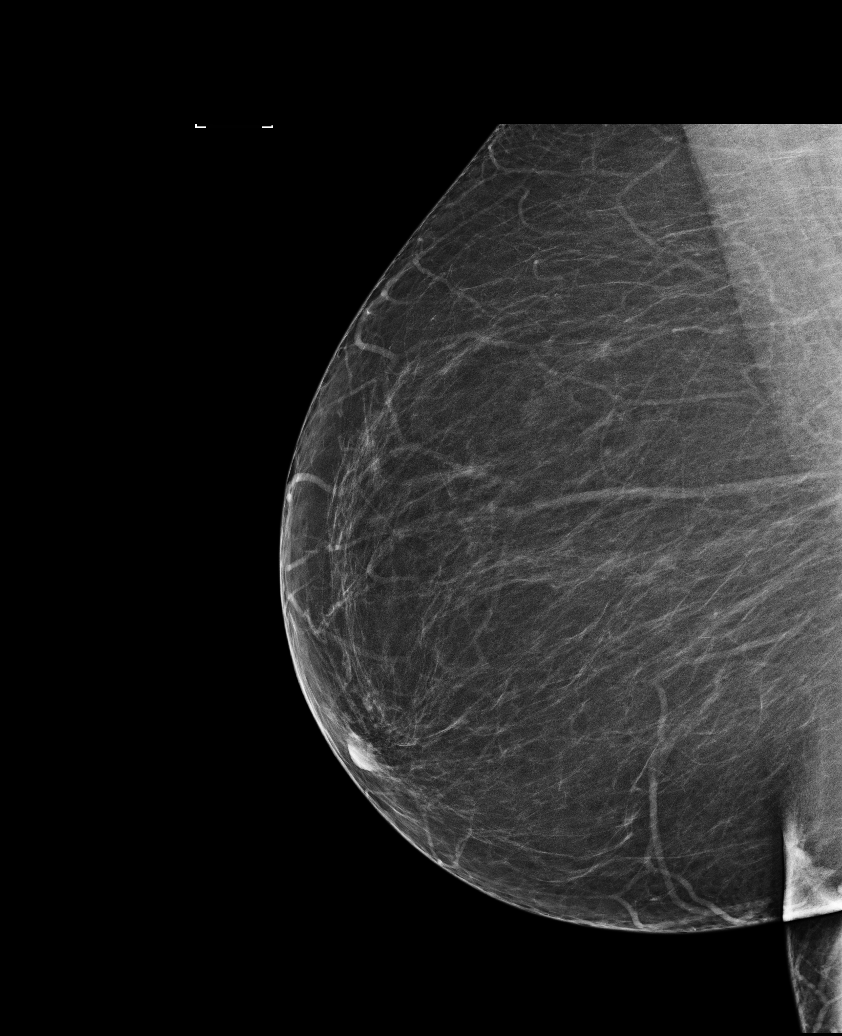

[R CC (2 of 2)]
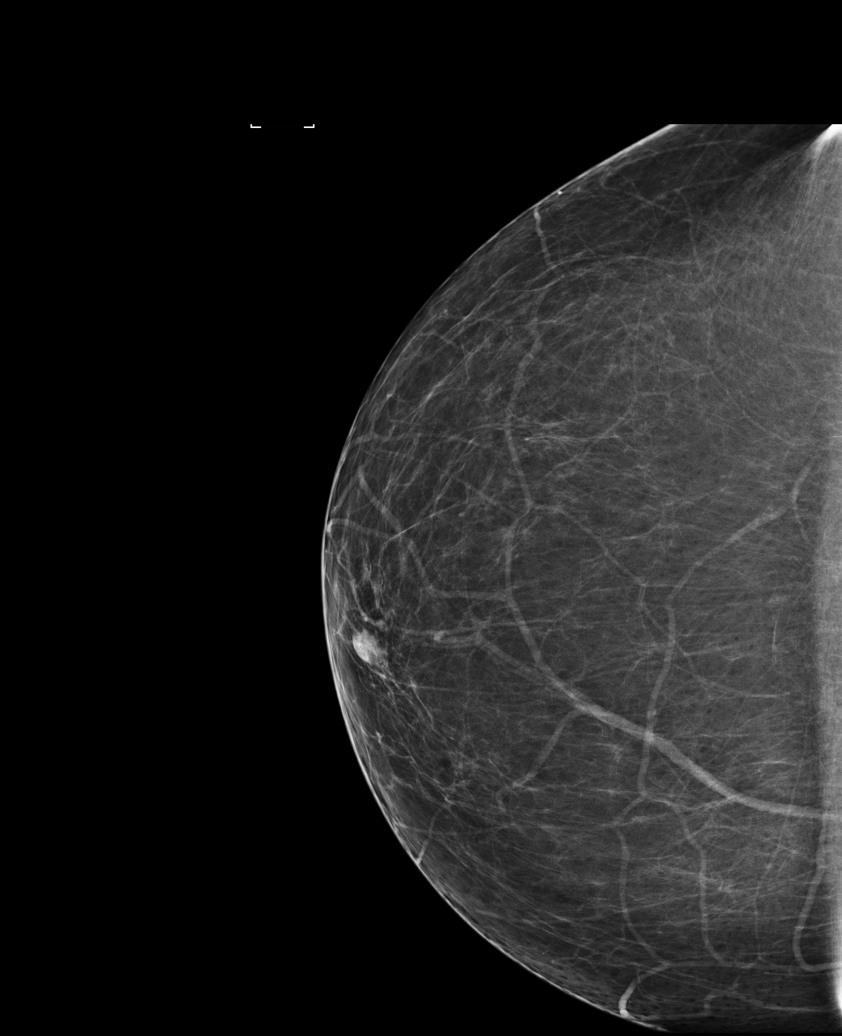

[L MLO]
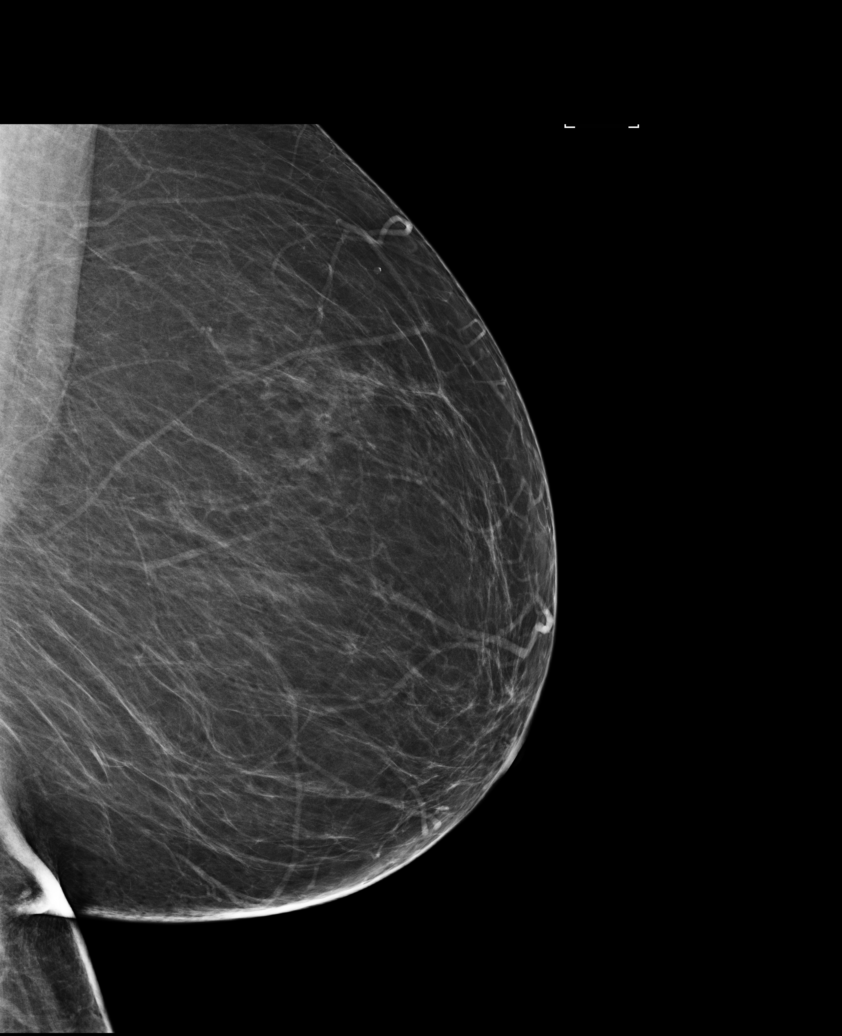

[6 of 21 positions shown; findings below may reference images not displayed]

FINDINGS: There are no findings suspicious for malignancy. Images were
processed with CAD.
IMPRESSION: No mammographic evidence of malignancy. A result letter of this
screening mammogram will be mailed directly to the patient.

RECOMMENDATION:
Screening mammogram in one year. (Code:[id number])

BI-RADS CATEGORY  1: Negative.

## 2018-03-03 ENCOUNTER — Ambulatory Visit (INDEPENDENT_AMBULATORY_CARE_PROVIDER_SITE_OTHER): Payer: Medicare HMO | Admitting: Family Medicine

## 2018-03-03 ENCOUNTER — Encounter: Payer: Self-pay | Admitting: Family Medicine

## 2018-03-03 VITALS — BP 142/90 | HR 79 | Resp 16 | Ht 66.0 in | Wt 245.1 lb

## 2018-03-03 DIAGNOSIS — Z794 Long term (current) use of insulin: Secondary | ICD-10-CM

## 2018-03-03 DIAGNOSIS — E1165 Type 2 diabetes mellitus with hyperglycemia: Secondary | ICD-10-CM

## 2018-03-03 DIAGNOSIS — I10 Essential (primary) hypertension: Secondary | ICD-10-CM

## 2018-03-03 DIAGNOSIS — IMO0001 Reserved for inherently not codable concepts without codable children: Secondary | ICD-10-CM

## 2018-03-03 DIAGNOSIS — E785 Hyperlipidemia, unspecified: Secondary | ICD-10-CM | POA: Diagnosis not present

## 2018-03-03 MED ORDER — LISINOPRIL 10 MG PO TABS: 10.0000 mg | ORAL_TABLET | Freq: Every day | ORAL | 1 refills | Status: DC

## 2018-03-03 NOTE — Assessment & Plan Note (Signed)
Uncontrolled start daily lisinopril DASH diet and commitment to daily physical activity for a minimum of 30 minutes discussed and encouraged, as a part of hypertension management. The importance of attaining a healthy weight is also discussed.  BP/Weight 03/03/2018 02/25/2018 01/29/2018 01/26/2018 12/30/2017 0/37/0964 07/04/3816  Systolic BP 403 754 - 360 677 034 -  Diastolic BP 90 82 - 67 85 80 -  Wt. (Lbs) 245.12 243.1 245 - - 246 246  BMI 39.56 39.24 39.54 - - 39.71 39.71

## 2018-03-03 NOTE — Assessment & Plan Note (Signed)
Deteriorated. Patient re-educated about  the importance of commitment to a  minimum of 150 minutes of exercise per week.  The importance of healthy food choices with portion control discussed. Encouraged to start a food diary, count calories and to consider  joining a support group. Sample diet sheets offered. Goals set by the patient for the next several months.   Weight /BMI 03/03/2018 02/25/2018 01/29/2018  WEIGHT 245 lb 1.9 oz 243 lb 1.6 oz 245 lb  HEIGHT 5\' 6"  - 5\' 6"   BMI 39.56 kg/m2 39.24 kg/m2 39.54 kg/m2

## 2018-03-03 NOTE — Progress Notes (Signed)
Helen Mahoney     MRN: [id number]      DOB: 1951/04/01   HPI Helen Mahoney is here for follow up and re-evaluation of chronic medical conditions, medication management and review of any available recent lab and radiology data.  Preventive health is updated, specifically  Cancer screening and Immunization.   Questions or concerns regarding consultations or procedures which the PT has had in the interim are  addressed. The PT denies any adverse reactions to current medications since the last visit.  Denies polyuria, polydipsia, blurred vision , or hypoglycemic episodes.Morning blood sugars generally 130, needs to increase exercise and reduce intake of sweets and starches, but she is working on both ROS Denies recent fever or chills. Denies sinus pressure, nasal congestion, ear pain or sore throat. Denies chest congestion, productive cough or wheezing. Denies chest pains, palpitations and leg swelling Denies abdominal pain, nausea, vomiting,diarrhea or constipation.   Denies dysuria, frequency, hesitancy or incontinence. Denies joint pain, swelling and limitation in mobility. Denies headaches, seizures, numbness, or tingling. Denies depression, anxiety or insomnia. Denies skin break down or rash.   PE  BP (!) 142/78   Pulse 79   Resp 16   Ht 5\' 6"  (1.676 m)   Wt 245 lb 1.9 oz (111.2 kg)   SpO2 96%   BMI 39.56 kg/m   Patient alert and oriented and in no cardiopulmonary distress.  HEENT: No facial asymmetry, EOMI,   oropharynx pink and moist.  Neck supple no JVD, no mass.  Chest: Clear to auscultation bilaterally.  CVS: S1, S2 no murmurs, no S3.Regular rate.  ABD: Soft non tender.   Ext: No edema  MS: Adequate ROM spine, shoulders, hips and knees.  Skin: Intact, no ulcerations or rash noted.  Psych: Good eye contact, normal affect. Memory intact not anxious or depressed appearing.  CNS: CN 2-12 intact, power,  normal throughout.no focal deficits  noted.   Assessment & Plan Essential hypertension Uncontrolled start daily lisinopril DASH diet and commitment to daily physical activity for a minimum of 30 minutes discussed and encouraged, as a part of hypertension management. The importance of attaining a healthy weight is also discussed.  BP/Weight 03/03/2018 02/25/2018 01/29/2018 01/26/2018 12/30/2017 5/42/7062 07/03/6281  Systolic BP 151 761 - 607 371 062 -  Diastolic BP 90 82 - 67 85 80 -  Wt. (Lbs) 245.12 243.1 245 - - 246 246  BMI 39.56 39.24 39.54 - - 39.71 39.71       Diabetes mellitus, insulin dependent (IDDM), uncontrolled ([place]) Updated lab needed at/ before next visit. Reports improvement, with log present tests twice daily , fluctuation in blood sugar persits , which is a concern Helen Mahoney is reminded of the importance of commitment to daily physical activity for 30 minutes or more, as able and the need to limit carbohydrate intake to 30 to 60 grams per meal to help with blood sugar control.   The need to take medication as prescribed, test blood sugar as directed, and to call between visits if there is a concern that blood sugar is uncontrolled is also discussed.   Helen Mahoney is reminded of the importance of daily foot exam, annual eye examination, and good blood sugar, blood pressure and cholesterol control.  Diabetic Labs Latest Ref Rng & Units 02/18/2018 12/09/2017 09/09/2017 08/22/2017 03/13/2017  HbA1c <5.7 % of total Hgb - 7.5(H) 8.6(H) - -  Microalbumin Not Estab. ug/mL - - - - 40.9(H)  Micro/Creat Ratio 0.0 - 30.0 mg/g creat - - - -  16.1  Chol <200 mg/dL - - 169 - -  HDL >50 mg/dL - - 47(L) - -  Calc LDL mg/dL (calc) - - 103(H) - -  Triglycerides <150 mg/dL - - 97 - -  Creatinine 0.44 - 1.00 mg/dL 0.96 0.94 0.89 0.96 -   BP/Weight 03/03/2018 02/25/2018 01/29/2018 01/26/2018 12/30/2017 0/07/5995 10/30/1421  Systolic BP 953 202 - 334 356 861 -  Diastolic BP 90 82 - 67 85 80 -  Wt. (Lbs) 245.12 243.1 245 - - 246 246   BMI 39.56 39.24 39.54 - - 39.71 39.71   Foot/eye exam completion dates Latest Ref Rng & Units 05/21/2017 12/11/2016  Eye Exam No Retinopathy - No Retinopathy  Foot exam Order - - -  Foot Form Completion - Done -        Morbid obesity Deteriorated. Patient re-educated about  the importance of commitment to a  minimum of 150 minutes of exercise per week.  The importance of healthy food choices with portion control discussed. Encouraged to start a food diary, count calories and to consider  joining a support group. Sample diet sheets offered. Goals set by the patient for the next several months.   Weight /BMI 03/03/2018 02/25/2018 01/29/2018  WEIGHT 245 lb 1.9 oz 243 lb 1.6 oz 245 lb  HEIGHT 5\' 6"  - 5\' 6"   BMI 39.56 kg/m2 39.24 kg/m2 39.54 kg/m2      Hyperlipidemia LDL goal <100 Hyperlipidemia:Low fat diet discussed and encouraged.   Lipid Panel  Lab Results  Component Value Date   CHOL 169 09/09/2017   HDL 47 (L) 09/09/2017   LDLCALC 103 (H) 09/09/2017   TRIG 97 09/09/2017   CHOLHDL 3.6 09/09/2017   Updated lab needed at/ before next visit.

## 2018-03-03 NOTE — Assessment & Plan Note (Signed)
Updated lab needed at/ before next visit. Reports improvement, with log present tests twice daily , fluctuation in blood sugar persits , which is a concern [name] is reminded of the importance of commitment to daily physical activity for 30 minutes or more, as able and the need to limit carbohydrate intake to 30 to 60 grams per meal to help with blood sugar control.   The need to take medication as prescribed, test blood sugar as directed, and to call between visits if there is a concern that blood sugar is uncontrolled is also discussed.   [name] is reminded of the importance of daily foot exam, annual eye examination, and good blood sugar, blood pressure and cholesterol control.  Diabetic Labs Latest Ref Rng & Units 02/18/2018 12/09/2017 09/09/2017 08/22/2017 03/13/2017  HbA1c <5.7 % of total Hgb - 7.5(H) 8.6(H) - -  Microalbumin Not Estab. ug/mL - - - - 40.9(H)  Micro/Creat Ratio 0.0 - 30.0 mg/g creat - - - - 16.1  Chol <200 mg/dL - - 169 - -  HDL >50 mg/dL - - 47(L) - -  Calc LDL mg/dL (calc) - - 103(H) - -  Triglycerides <150 mg/dL - - 97 - -  Creatinine 0.44 - 1.00 mg/dL 0.96 0.94 0.89 0.96 -   BP/Weight 03/03/2018 02/25/2018 01/29/2018 01/26/2018 12/30/2017 5/68/1275 05/06/15  Systolic BP 494 496 - 759 163 846 -  Diastolic BP 90 82 - 67 85 80 -  Wt. (Lbs) 245.12 243.1 245 - - 246 246  BMI 39.56 39.24 39.54 - - 39.71 39.71   Foot/eye exam completion dates Latest Ref Rng & Units 05/21/2017 12/11/2016  Eye Exam No Retinopathy - No Retinopathy  Foot exam Order - - -  Foot Form Completion - Done -

## 2018-03-03 NOTE — Patient Instructions (Addendum)
Annual physical exam Jan 24 or shortly after, call if you need me sooner  Please cancel November appt    Blood pressure is high, start lisinopril 10 mg one daily, work on weight loss also and healthy food choice, lots of vegetable   Fasting lipid, cmp and EGFr hBA1C and microalb Nov 18 or shortly after  It is important that you exercise regularly at least 30 minutes 5 times a week. If you develop chest pain, have severe difficulty breathing, or feel very tired, stop exercising immediately and seek medical attention

## 2018-03-03 NOTE — Assessment & Plan Note (Signed)
Hyperlipidemia:Low fat diet discussed and encouraged.   Lipid Panel  Lab Results  Component Value Date   CHOL 169 09/09/2017   HDL 47 (L) 09/09/2017   LDLCALC 103 (H) 09/09/2017   TRIG 97 09/09/2017   CHOLHDL 3.6 09/09/2017   Updated lab needed at/ before next visit.

## 2018-03-19 DIAGNOSIS — E785 Hyperlipidemia, unspecified: Secondary | ICD-10-CM | POA: Diagnosis not present

## 2018-03-19 DIAGNOSIS — I10 Essential (primary) hypertension: Secondary | ICD-10-CM | POA: Diagnosis not present

## 2018-03-19 DIAGNOSIS — E1065 Type 1 diabetes mellitus with hyperglycemia: Secondary | ICD-10-CM | POA: Diagnosis not present

## 2018-03-20 LAB — LIPID PANEL
CHOL/HDL RATIO: 3.5 (calc) (ref <5.0)
CHOLESTEROL: 170 mg/dL (ref <200)
HDL: 49 mg/dL — AB (ref >50)
LDL CHOLESTEROL (CALC): 103 mg/dL — AB
NON-HDL CHOLESTEROL (CALC): 121 mg/dL (ref <130)
Triglycerides: 86 mg/dL (ref <150)

## 2018-03-20 LAB — HEMOGLOBIN A1C
HEMOGLOBIN A1C: 7.2 %{Hb} — AB (ref <5.7)
MEAN PLASMA GLUCOSE: 160 (calc)
eAG (mmol/L): 8.9 (calc)

## 2018-03-20 LAB — MICROALBUMIN, URINE: Microalb, Ur: 2.6 mg/dL

## 2018-03-25 ENCOUNTER — Ambulatory Visit: Payer: Medicare HMO | Admitting: Family Medicine

## 2018-03-29 ENCOUNTER — Other Ambulatory Visit: Payer: Self-pay | Admitting: Family Medicine

## 2018-03-31 ENCOUNTER — Inpatient Hospital Stay (HOSPITAL_COMMUNITY): Payer: Medicare HMO | Attending: Internal Medicine

## 2018-03-31 ENCOUNTER — Encounter (HOSPITAL_COMMUNITY): Payer: Self-pay

## 2018-03-31 ENCOUNTER — Other Ambulatory Visit: Payer: Self-pay

## 2018-03-31 VITALS — BP 140/70 | HR 82 | Temp 97.8°F | Resp 20

## 2018-03-31 DIAGNOSIS — E538 Deficiency of other specified B group vitamins: Secondary | ICD-10-CM

## 2018-03-31 MED ORDER — CYANOCOBALAMIN 1000 MCG/ML IJ SOLN
1000.0000 ug | Freq: Once | INTRAMUSCULAR | Status: AC
  Administered 2018-03-31: 1000 ug via INTRAMUSCULAR

## 2018-03-31 MED ORDER — CYANOCOBALAMIN 1000 MCG/ML IJ SOLN
INTRAMUSCULAR | Status: AC
  Filled 2018-03-31: qty 1

## 2018-03-31 NOTE — Progress Notes (Signed)
Helen Mahoney presents today for injection per MD orders. B12 1043mcg administered IM in right Upper Arm. Administration without incident. Patient tolerated well.

## 2018-04-15 ENCOUNTER — Other Ambulatory Visit: Payer: Self-pay | Admitting: Family Medicine

## 2018-05-01 ENCOUNTER — Inpatient Hospital Stay (HOSPITAL_COMMUNITY): Payer: Medicare HMO | Attending: Internal Medicine

## 2018-05-01 ENCOUNTER — Other Ambulatory Visit: Payer: Self-pay

## 2018-05-01 ENCOUNTER — Encounter (HOSPITAL_COMMUNITY): Payer: Self-pay

## 2018-05-01 VITALS — BP 115/62 | HR 93 | Temp 97.8°F

## 2018-05-01 DIAGNOSIS — E538 Deficiency of other specified B group vitamins: Secondary | ICD-10-CM | POA: Insufficient documentation

## 2018-05-01 MED ORDER — CYANOCOBALAMIN 1000 MCG/ML IJ SOLN
1000.0000 ug | Freq: Once | INTRAMUSCULAR | Status: AC
  Administered 2018-05-01: 1000 ug via INTRAMUSCULAR
  Filled 2018-05-01: qty 1

## 2018-05-01 NOTE — Progress Notes (Signed)
Helen Mahoney presents today for injection per the provider's orders.  B12 administration without incident; see MAR for injection details.  Patient tolerated procedure well and without incident.  No questions or complaints noted at this time.  Discharged ambulatory.

## 2018-05-11 ENCOUNTER — Other Ambulatory Visit: Payer: Self-pay | Admitting: Family Medicine

## 2018-05-13 ENCOUNTER — Ambulatory Visit: Payer: Medicare HMO | Admitting: Nutrition

## 2018-05-25 ENCOUNTER — Ambulatory Visit (INDEPENDENT_AMBULATORY_CARE_PROVIDER_SITE_OTHER): Payer: Medicare HMO | Admitting: Family Medicine

## 2018-05-25 ENCOUNTER — Encounter: Payer: Self-pay | Admitting: Family Medicine

## 2018-05-25 VITALS — BP 124/72 | HR 72 | Resp 15 | Ht 66.0 in | Wt 238.0 lb

## 2018-05-25 DIAGNOSIS — E785 Hyperlipidemia, unspecified: Secondary | ICD-10-CM

## 2018-05-25 DIAGNOSIS — Z0000 Encounter for general adult medical examination without abnormal findings: Secondary | ICD-10-CM

## 2018-05-25 DIAGNOSIS — Z794 Long term (current) use of insulin: Secondary | ICD-10-CM

## 2018-05-25 DIAGNOSIS — E1165 Type 2 diabetes mellitus with hyperglycemia: Secondary | ICD-10-CM

## 2018-05-25 DIAGNOSIS — I10 Essential (primary) hypertension: Secondary | ICD-10-CM | POA: Diagnosis not present

## 2018-05-25 DIAGNOSIS — IMO0001 Reserved for inherently not codable concepts without codable children: Secondary | ICD-10-CM

## 2018-05-25 NOTE — Assessment & Plan Note (Signed)

## 2018-05-25 NOTE — Patient Instructions (Signed)
F/U mid March, call if you need me before   Wellness to be scheduled also with nurse  Fasting lipid, cmp and EGFeR, HBa1C1 week before March visit  Keep up the great work 5 pound weight loss is our goal  It is important that you exercise regularly at least 30 minutes 5 times a week. If you develop chest pain, have severe difficulty breathing, or feel very tired, stop exercising immediately and seek medical attention  .

## 2018-05-25 NOTE — Progress Notes (Signed)
Helen Mahoney     MRN: [id number]      DOB: 1950-09-14  HPI: Patient is in for annual physical exam. No other health concerns are expressed or addressed at the visit. Marland Kitchen   PE: BP 124/72   Pulse 72   Resp 15   Ht 5\' 6"  (1.676 m)   Wt 238 lb (108 kg)   SpO2 98%   BMI 38.41 kg/m   Pleasant  female, alert and oriented x 3, in no cardio-pulmonary distress. Afebrile. HEENT No facial trauma or asymetry. Sinuses non tender.  Extra occullar muscles intact, pupils equally reactive to light. External ears normal, tympanic membranes clear. Oropharynx moist, no exudate. Neck: supple, no adenopathy,JVD or thyromegaly.No bruits.  Chest: Clear to ascultation bilaterally.No crackles or wheezes. Non tender to palpation  Breast: No asymetry,no masses or lumps. No tenderness. No nipple discharge or inversion. No axillary or supraclavicular adenopathy  Cardiovascular system; Heart sounds normal,  S1 and  S2 ,no S3.  No murmur, or thrill. Apical beat not displaced Peripheral pulses normal.  Abdomen: Soft, non tender, no organomegaly or masses. No bruits. Bowel sounds normal. No guarding, tenderness or rebound.    Musculoskeletal exam: Full ROM of spine, hips , shoulders and knees. No deformity ,swelling or crepitus noted. No muscle wasting or atrophy.   Neurologic: Cranial nerves 2 to 12 intact. Power, tone ,sensation and reflexes normal throughout. No disturbance in gait. No tremor.  Skin: Intact, no ulceration, erythema , scaling or rash noted. Pigmentation normal throughout  Psych; Normal mood and affect. Judgement and concentration normal   Assessment & Plan:  Annual physical exam Annual exam as documented. Counseling done  re healthy lifestyle involving commitment to 150 minutes exercise per week, heart healthy diet, and attaining healthy weight.The importance of adequate sleep also discussed. Regular seat belt use and home safety, is also  discussed. Changes in health habits are decided on by the patient with goals and time frames  set for achieving them. Immunization and cancer screening needs are specifically addressed at this visit.   Morbid obesity improved. Patient re-educated about  the importance of commitment to a  minimum of 150 minutes of exercise per week.  The importance of healthy food choices with portion control discussed. Encouraged to start a food diary, count calories and to consider  joining a support group. Sample diet sheets offered. Goals set by the patient for the next several months.   Weight /BMI 05/25/2018 03/03/2018 02/25/2018  WEIGHT 238 lb 245 lb 1.9 oz 243 lb 1.6 oz  HEIGHT 5\' 6"  5\' 6"  -  BMI 38.41 kg/m2 39.56 kg/m2 39.24 kg/m2      Diabetes mellitus, insulin dependent (IDDM), uncontrolled ([facility]) Controlled, no change in medication Helen Mahoney is reminded of the importance of commitment to daily physical activity for 30 minutes or more, as able and the need to limit carbohydrate intake to 30 to 60 grams per meal to help with blood sugar control.   The need to take medication as prescribed, test blood sugar as directed, and to call between visits if there is a concern that blood sugar is uncontrolled is also discussed.   Helen Mahoney is reminded of the importance of daily foot exam, annual eye examination, and good blood sugar, blood pressure and cholesterol control.  Diabetic Labs Latest Ref Rng & Units 03/19/2018 02/18/2018 12/09/2017 09/09/2017 08/22/2017  HbA1c <5.7 % of total Hgb 7.2(H) - 7.5(H) 8.6(H) -  Microalbumin mg/dL 2.6 - - - -  Micro/Creat Ratio 0.0 - 30.0 mg/g creat - - - - -  Chol <200 mg/dL 170 - - 169 -  HDL >50 mg/dL 49(L) - - 47(L) -  Calc LDL mg/dL (calc) 103(H) - - 103(H) -  Triglycerides <150 mg/dL 86 - - 97 -  Creatinine 0.44 - 1.00 mg/dL - 0.96 0.94 0.89 0.96   BP/Weight 05/25/2018 05/01/2018 03/31/2018 03/03/2018 02/25/2018 01/29/2018 2/49/3241  Systolic BP 991 444 584  835 075 - 732  Diastolic BP 72 62 70 90 82 - 67  Wt. (Lbs) 238 - - 245.12 243.1 245 -  BMI 38.41 - - 39.56 39.24 39.54 -   Foot/eye exam completion dates Latest Ref Rng & Units 05/25/2018 05/21/2017  Eye Exam No Retinopathy - -  Foot exam Order - - -  Foot Form Completion - Done Done

## 2018-05-25 NOTE — Assessment & Plan Note (Signed)
Controlled, no change in medication Helen Mahoney is reminded of the importance of commitment to daily physical activity for 30 minutes or more, as able and the need to limit carbohydrate intake to 30 to 60 grams per meal to help with blood sugar control.   The need to take medication as prescribed, test blood sugar as directed, and to call between visits if there is a concern that blood sugar is uncontrolled is also discussed.   Helen Mahoney is reminded of the importance of daily foot exam, annual eye examination, and good blood sugar, blood pressure and cholesterol control.  Diabetic Labs Latest Ref Rng & Units 03/19/2018 02/18/2018 12/09/2017 09/09/2017 08/22/2017  HbA1c <5.7 % of total Hgb 7.2(H) - 7.5(H) 8.6(H) -  Microalbumin mg/dL 2.6 - - - -  Micro/Creat Ratio 0.0 - 30.0 mg/g creat - - - - -  Chol <200 mg/dL 170 - - 169 -  HDL >50 mg/dL 49(L) - - 47(L) -  Calc LDL mg/dL (calc) 103(H) - - 103(H) -  Triglycerides <150 mg/dL 86 - - 97 -  Creatinine 0.44 - 1.00 mg/dL - 0.96 0.94 0.89 0.96   BP/Weight 05/25/2018 05/01/2018 03/31/2018 03/03/2018 02/25/2018 01/29/2018 1/61/0960  Systolic BP 454 098 119 147 829 - 562  Diastolic BP 72 62 70 90 82 - 67  Wt. (Lbs) 238 - - 245.12 243.1 245 -  BMI 38.41 - - 39.56 39.24 39.54 -   Foot/eye exam completion dates Latest Ref Rng & Units 05/25/2018 05/21/2017  Eye Exam No Retinopathy - -  Foot exam Order - - -  Foot Form Completion - Done Done

## 2018-05-25 NOTE — Assessment & Plan Note (Signed)
improved. Patient re-educated about  the importance of commitment to a  minimum of 150 minutes of exercise per week.  The importance of healthy food choices with portion control discussed. Encouraged to start a food diary, count calories and to consider  joining a support group. Sample diet sheets offered. Goals set by the patient for the next several months.   Weight /BMI 05/25/2018 03/03/2018 02/25/2018  WEIGHT 238 lb 245 lb 1.9 oz 243 lb 1.6 oz  HEIGHT 5\' 6"  5\' 6"  -  BMI 38.41 kg/m2 39.56 kg/m2 39.24 kg/m2

## 2018-06-01 ENCOUNTER — Inpatient Hospital Stay (HOSPITAL_COMMUNITY): Payer: Medicare HMO | Attending: Internal Medicine

## 2018-06-01 ENCOUNTER — Encounter (HOSPITAL_COMMUNITY): Payer: Self-pay

## 2018-06-01 VITALS — BP 124/69 | HR 79 | Temp 97.6°F | Resp 18

## 2018-06-01 DIAGNOSIS — E538 Deficiency of other specified B group vitamins: Secondary | ICD-10-CM

## 2018-06-01 MED ORDER — CYANOCOBALAMIN 1000 MCG/ML IJ SOLN
INTRAMUSCULAR | Status: AC
  Filled 2018-06-01: qty 1

## 2018-06-01 MED ORDER — CYANOCOBALAMIN 1000 MCG/ML IJ SOLN
1000.0000 ug | Freq: Once | INTRAMUSCULAR | Status: AC
  Administered 2018-06-01: 1000 ug via INTRAMUSCULAR

## 2018-06-01 NOTE — Patient Instructions (Signed)
[facility] at [facility]  Discharge Instructions:   _______________________________________________________________  Thank you for choosing [facility] at [facility] to provide your oncology and hematology care.  To afford each patient quality time with our providers, please arrive at least 15 minutes before your scheduled appointment.  You need to re-schedule your appointment if you arrive 10 or more minutes late.  We strive to give you quality time with our providers, and arriving late affects you and other patients whose appointments are after yours.  Also, if you no show three or more times for appointments you may be dismissed from the clinic.  Again, thank you for choosing [facility] at [facility]. Our hope is that these requests will allow you access to exceptional care and in a timely manner. _______________________________________________________________  If you have questions after your visit, please contact our office at [phone] between the hours of 8:30 a.m. and 5:00 p.m. Voicemails left after 4:30 p.m. will not be returned until the following business day. _______________________________________________________________  For prescription refill requests, have your pharmacy contact our office. _______________________________________________________________  Recommendations made by the consultant and any test results will be sent to your referring physician. _______________________________________________________________ 

## 2018-06-01 NOTE — Progress Notes (Signed)
B12 injection given today per orders.  Patient tolerated it well without problems. Vitals stable and discharged home from clinic ambulatory. Follow up as scheduled.  

## 2018-06-30 ENCOUNTER — Inpatient Hospital Stay (HOSPITAL_COMMUNITY): Payer: Medicare HMO | Attending: Internal Medicine

## 2018-06-30 ENCOUNTER — Encounter (HOSPITAL_COMMUNITY): Payer: Self-pay

## 2018-06-30 ENCOUNTER — Other Ambulatory Visit: Payer: Self-pay

## 2018-06-30 VITALS — BP 137/83 | HR 91 | Temp 97.7°F | Resp 18

## 2018-06-30 DIAGNOSIS — E538 Deficiency of other specified B group vitamins: Secondary | ICD-10-CM | POA: Diagnosis not present

## 2018-06-30 MED ORDER — CYANOCOBALAMIN 1000 MCG/ML IJ SOLN
1000.0000 ug | Freq: Once | INTRAMUSCULAR | Status: AC
  Administered 2018-06-30: 1000 ug via INTRAMUSCULAR
  Filled 2018-06-30: qty 1

## 2018-06-30 NOTE — Progress Notes (Signed)
Helen Mahoney presents today for injection per MD orders. B12 1,000 mcg administered SQ in right Upper Arm. Administration without incident. Patient tolerated well.  Vitals stable and discharged home from clinic ambulatory. Follow up as scheduled.

## 2018-06-30 NOTE — Patient Instructions (Signed)
[facility] at [facility]  Discharge Instructions:   _______________________________________________________________  Thank you for choosing [facility] at [facility] to provide your oncology and hematology care.  To afford each patient quality time with our providers, please arrive at least 15 minutes before your scheduled appointment.  You need to re-schedule your appointment if you arrive 10 or more minutes late.  We strive to give you quality time with our providers, and arriving late affects you and other patients whose appointments are after yours.  Also, if you no show three or more times for appointments you may be dismissed from the clinic.  Again, thank you for choosing [facility] at [facility]. Our hope is that these requests will allow you access to exceptional care and in a timely manner. _______________________________________________________________  If you have questions after your visit, please contact our office at [phone] between the hours of 8:30 a.m. and 5:00 p.m. Voicemails left after 4:30 p.m. will not be returned until the following business day. _______________________________________________________________  For prescription refill requests, have your pharmacy contact our office. _______________________________________________________________  Recommendations made by the consultant and any test results will be sent to your referring physician. _______________________________________________________________ 

## 2018-07-01 ENCOUNTER — Ambulatory Visit (INDEPENDENT_AMBULATORY_CARE_PROVIDER_SITE_OTHER): Payer: Medicare HMO | Admitting: Family Medicine

## 2018-07-01 ENCOUNTER — Encounter: Payer: Self-pay | Admitting: Family Medicine

## 2018-07-01 VITALS — BP 138/70 | HR 77 | Resp 14 | Ht 66.0 in | Wt 232.0 lb

## 2018-07-01 DIAGNOSIS — I10 Essential (primary) hypertension: Secondary | ICD-10-CM

## 2018-07-01 DIAGNOSIS — J209 Acute bronchitis, unspecified: Secondary | ICD-10-CM

## 2018-07-01 DIAGNOSIS — Z794 Long term (current) use of insulin: Secondary | ICD-10-CM | POA: Diagnosis not present

## 2018-07-01 DIAGNOSIS — E1165 Type 2 diabetes mellitus with hyperglycemia: Secondary | ICD-10-CM

## 2018-07-01 DIAGNOSIS — IMO0001 Reserved for inherently not codable concepts without codable children: Secondary | ICD-10-CM

## 2018-07-01 DIAGNOSIS — Z23 Encounter for immunization: Secondary | ICD-10-CM | POA: Diagnosis not present

## 2018-07-01 MED ORDER — BENZONATATE 100 MG PO CAPS: 100.0000 mg | ORAL_CAPSULE | Freq: Two times a day (BID) | ORAL | 0 refills | Status: DC | PRN

## 2018-07-01 MED ORDER — AZITHROMYCIN 250 MG PO TABS: ORAL_TABLET | ORAL | 0 refills | Status: DC

## 2018-07-01 NOTE — Progress Notes (Signed)
Helen Mahoney     MRN: [id number]      DOB: Sep 12, 1950   HPI Helen Mahoney is here with night sweats for the past 3 days, has been in sick environment and also her grandchild had the flu Using Robituusin  With some rel;ief No ear pain or sore throat No urinary frequesncy Denies polyuria, polydipsia, blurred vision , or hypoglycemic episodes.   ROS Denies recent fever or chills.  Denies chest pains, palpitations and leg swelling Denies abdominal pain, nausea, vomiting,diarrhea or constipation.   Denies dysuria, frequency, hesitancy or incontinence. Denies joint pain, swelling and limitation in mobility. Denies headaches, seizures, numbness, or tingling. Denies depression, anxiety or insomnia. Denies skin break down or rash.   PE  BP 138/70   Pulse 77   Resp (!) 23   Ht 5\' 6"  (1.676 m)   Wt 232 lb 0.6 oz (105.3 kg)   SpO2 95% Comment: room air  BMI 37.45 kg/m   Patient alert and oriented and in no cardiopulmonary distress.  HEENT: No facial asymmetry, EOMI,   oropharynx pink and moist.  Neck supple no JVD, no mass.  Chest: decreased air entry , bibasilar crackles and few wheezesCVS: S1, S2 no murmurs, no S3.Regular rate.  ABD: Soft non tender.   Ext: No edema  MS: Adequate ROM spine, shoulders, hips and knees.  Skin: Intact, no ulcerations or rash noted.  Psych: Good eye contact, normal affect. Memory intact not anxious or depressed appearing.  CNS: CN 2-12 intact, power,  normal throughout.no focal deficits noted.   Assessment & Plan  Acute bronchitis Tessalon perle and azithromycin prescribed  Essential hypertension Controlled, no change in medication DASH diet and commitment to daily physical activity for a minimum of 30 minutes discussed and encouraged, as a part of hypertension management. The importance of attaining a healthy weight is also discussed.  BP/Weight 07/01/2018 06/30/2018 06/01/2018 05/25/2018 05/01/2018 03/31/2018 39/10/6732  Systolic BP 193  790 240 973 532 992 426  Diastolic BP 70 83 69 72 62 70 90  Wt. (Lbs) 232.04 - - 238 - - 245.12  BMI 37.45 - - 38.41 - - 39.56       Morbid obesity Obesity linked with hypertension and diabetes. Improved Patient re-educated about  the importance of commitment to a  minimum of 150 minutes of exercise per week as able.  The importance of healthy food choices with portion control discussed, as well as eating regularly and within a 12 hour window most days. The need to choose "clean , green" food 50 to 75% of the time is discussed, as well as to make water the primary drink and set a goal of 64 ounces water daily.  Encouraged to start a food diary,  and to consider  joining a support group. Sample diet sheets offered. Goals set by the patient for the next several months.   Weight /BMI 07/01/2018 05/25/2018 03/03/2018  WEIGHT 232 lb 0.6 oz 238 lb 245 lb 1.9 oz  HEIGHT 5\' 6"  5\' 6"  5\' 6"   BMI 37.45 kg/m2 38.41 kg/m2 39.56 kg/m2      Diabetes mellitus, insulin dependent (IDDM), uncontrolled ([facility]) Controlled, no change in medication Helen Mahoney is reminded of the importance of commitment to daily physical activity for 30 minutes or more, as able and the need to limit carbohydrate intake to 30 to 60 grams per meal to help with blood sugar control.   The need to take medication as prescribed, test blood sugar as directed,  and to call between visits if there is a concern that blood sugar is uncontrolled is also discussed.   Helen Mahoney is reminded of the importance of daily foot exam, annual eye examination, and good blood sugar, blood pressure and cholesterol control. Updated lab needed at/ before next visit.   Diabetic Labs Latest Ref Rng & Units 03/19/2018 02/18/2018 12/09/2017 09/09/2017 08/22/2017  HbA1c <5.7 % of total Hgb 7.2(H) - 7.5(H) 8.6(H) -  Microalbumin mg/dL 2.6 - - - -  Micro/Creat Ratio 0.0 - 30.0 mg/g creat - - - - -  Chol <200 mg/dL 170 - - 169 -  HDL >50 mg/dL 49(L) - -  47(L) -  Calc LDL mg/dL (calc) 103(H) - - 103(H) -  Triglycerides <150 mg/dL 86 - - 97 -  Creatinine 0.44 - 1.00 mg/dL - 0.96 0.94 0.89 0.96   BP/Weight 07/01/2018 06/30/2018 06/01/2018 05/25/2018 05/01/2018 03/31/2018 54/09/5033  Systolic BP 465 681 275 170 017 494 496  Diastolic BP 70 83 69 72 62 70 90  Wt. (Lbs) 232.04 - - 238 - - 245.12  BMI 37.45 - - 38.41 - - 39.56   Foot/eye exam completion dates Latest Ref Rng & Units 05/25/2018 05/21/2017  Eye Exam No Retinopathy - -  Foot exam Order - - -  Foot Form Completion - Done Done

## 2018-07-01 NOTE — Assessment & Plan Note (Signed)
Controlled, no change in medication Helen Mahoney is reminded of the importance of commitment to daily physical activity for 30 minutes or more, as able and the need to limit carbohydrate intake to 30 to 60 grams per meal to help with blood sugar control.   The need to take medication as prescribed, test blood sugar as directed, and to call between visits if there is a concern that blood sugar is uncontrolled is also discussed.   Helen Mahoney is reminded of the importance of daily foot exam, annual eye examination, and good blood sugar, blood pressure and cholesterol control. Updated lab needed at/ before next visit.   Diabetic Labs Latest Ref Rng & Units 03/19/2018 02/18/2018 12/09/2017 09/09/2017 08/22/2017  HbA1c <5.7 % of total Hgb 7.2(H) - 7.5(H) 8.6(H) -  Microalbumin mg/dL 2.6 - - - -  Micro/Creat Ratio 0.0 - 30.0 mg/g creat - - - - -  Chol <200 mg/dL 170 - - 169 -  HDL >50 mg/dL 49(L) - - 47(L) -  Calc LDL mg/dL (calc) 103(H) - - 103(H) -  Triglycerides <150 mg/dL 86 - - 97 -  Creatinine 0.44 - 1.00 mg/dL - 0.96 0.94 0.89 0.96   BP/Weight 07/01/2018 06/30/2018 06/01/2018 05/25/2018 05/01/2018 03/31/2018 86/10/6193  Systolic BP 093 267 124 580 998 338 250  Diastolic BP 70 83 69 72 62 70 90  Wt. (Lbs) 232.04 - - 238 - - 245.12  BMI 37.45 - - 38.41 - - 39.56   Foot/eye exam completion dates Latest Ref Rng & Units 05/25/2018 05/21/2017  Eye Exam No Retinopathy - -  Foot exam Order - - -  Foot Form Completion - Done Done

## 2018-07-01 NOTE — Assessment & Plan Note (Signed)
Controlled, no change in medication DASH diet and commitment to daily physical activity for a minimum of 30 minutes discussed and encouraged, as a part of hypertension management. The importance of attaining a healthy weight is also discussed.  BP/Weight 07/01/2018 06/30/2018 06/01/2018 05/25/2018 05/01/2018 03/31/2018 68/08/9921  Systolic BP 414 436 016 580 063 494 944  Diastolic BP 70 83 69 72 62 70 90  Wt. (Lbs) 232.04 - - 238 - - 245.12  BMI 37.45 - - 38.41 - - 39.56

## 2018-07-01 NOTE — Assessment & Plan Note (Signed)
Tessalon perle and azithromycin prescribed

## 2018-07-01 NOTE — Assessment & Plan Note (Signed)
Obesity linked with hypertension and diabetes. Improved Patient re-educated about  the importance of commitment to a  minimum of 150 minutes of exercise per week as able.  The importance of healthy food choices with portion control discussed, as well as eating regularly and within a 12 hour window most days. The need to choose "clean , green" food 50 to 75% of the time is discussed, as well as to make water the primary drink and set a goal of 64 ounces water daily.  Encouraged to start a food diary,  and to consider  joining a support group. Sample diet sheets offered. Goals set by the patient for the next several months.   Weight /BMI 07/01/2018 05/25/2018 03/03/2018  WEIGHT 232 lb 0.6 oz 238 lb 245 lb 1.9 oz  HEIGHT 5\' 6"  5\' 6"  5\' 6"   BMI 37.45 kg/m2 38.41 kg/m2 39.56 kg/m2

## 2018-07-01 NOTE — Patient Instructions (Signed)
F/U as before, call if you need me sooner  You are treated for acute bronchitis, 2 medications are at your pharmacy  It is important that you exercise regularly at least 30 minutes 5 times a week. If you develop chest pain, have severe difficulty breathing, or feel very tired, stop exercising immediately and seek medical attention  Thanks for choosing [facility], we consider it a privelige to serve you.   Think about what you will eat, plan ahead. Choose " clean, green, fresh or frozen" over canned, processed or packaged foods which are more sugary, salty and fatty. 70 to 75% of food eaten should be vegetables and fruit. Three meals at set times with snacks allowed between meals, but they must be fruit or vegetables. Aim to eat over a 12 hour period , example 7 am to 7 pm, and STOP after  your last meal of the day. Drink water,generally about 64 ounces per day, no other drink is as healthy. Fruit juice is best enjoyed in a healthy way, by EATING the fruit.

## 2018-07-17 DIAGNOSIS — E1165 Type 2 diabetes mellitus with hyperglycemia: Secondary | ICD-10-CM | POA: Diagnosis not present

## 2018-07-17 DIAGNOSIS — I10 Essential (primary) hypertension: Secondary | ICD-10-CM | POA: Diagnosis not present

## 2018-07-17 DIAGNOSIS — Z794 Long term (current) use of insulin: Secondary | ICD-10-CM | POA: Diagnosis not present

## 2018-07-17 DIAGNOSIS — E785 Hyperlipidemia, unspecified: Secondary | ICD-10-CM | POA: Diagnosis not present

## 2018-07-18 LAB — COMPLETE METABOLIC PANEL WITH GFR
AG Ratio: 1.2 (calc) (ref 1.0–2.5)
ALT: 17 U/L (ref 6–29)
AST: 21 U/L (ref 10–35)
Albumin: 3.7 g/dL (ref 3.6–5.1)
Alkaline phosphatase (APISO): 96 U/L (ref 37–153)
BUN: 13 mg/dL (ref 7–25)
CO2: 25 mmol/L (ref 20–32)
CREATININE: 0.97 mg/dL (ref 0.50–0.99)
Calcium: 9.2 mg/dL (ref 8.6–10.4)
Chloride: 105 mmol/L (ref 98–110)
GFR, Est African American: 70 mL/min/{1.73_m2} (ref >=60)
GFR, Est Non African American: 60 mL/min/{1.73_m2} (ref >=60)
Globulin: 3 g/dL (calc) (ref 1.9–3.7)
Glucose, Bld: 47 mg/dL — ABNORMAL LOW (ref 65–99)
Potassium: 4 mmol/L (ref 3.5–5.3)
Sodium: 141 mmol/L (ref 135–146)
Total Bilirubin: 0.4 mg/dL (ref 0.2–1.2)
Total Protein: 6.7 g/dL (ref 6.1–8.1)

## 2018-07-18 LAB — LIPID PANEL
Cholesterol: 143 mg/dL (ref <200)
HDL: 50 mg/dL (ref >=50)
LDL Cholesterol (Calc): 79 mg/dL (calc)
NON-HDL CHOLESTEROL (CALC): 93 mg/dL (ref <130)
Total CHOL/HDL Ratio: 2.9 (calc) (ref <5.0)
Triglycerides: 68 mg/dL (ref <150)

## 2018-07-18 LAB — HEMOGLOBIN A1C
EAG (MMOL/L): 8.2 (calc)
Hgb A1c MFr Bld: 6.8 % of total Hgb — ABNORMAL HIGH (ref <5.7)
Mean Plasma Glucose: 148 (calc)

## 2018-07-21 ENCOUNTER — Telehealth (INDEPENDENT_AMBULATORY_CARE_PROVIDER_SITE_OTHER): Payer: Medicare HMO | Admitting: Family Medicine

## 2018-07-21 DIAGNOSIS — E1165 Type 2 diabetes mellitus with hyperglycemia: Secondary | ICD-10-CM | POA: Diagnosis not present

## 2018-07-21 DIAGNOSIS — Z794 Long term (current) use of insulin: Secondary | ICD-10-CM | POA: Diagnosis not present

## 2018-07-21 DIAGNOSIS — E1169 Type 2 diabetes mellitus with other specified complication: Secondary | ICD-10-CM | POA: Diagnosis not present

## 2018-07-21 DIAGNOSIS — E785 Hyperlipidemia, unspecified: Secondary | ICD-10-CM

## 2018-07-21 DIAGNOSIS — IMO0001 Reserved for inherently not codable concepts without codable children: Secondary | ICD-10-CM

## 2018-07-21 NOTE — Patient Instructions (Signed)
    Thank you doing your office visit of the telephone today.  I appreciate the opportunity to provide you with the care for your health and wellness. Today we discussed: weight, diabetes, and cholesterol, and lab work.  Follow-up in 3 months with Dr. Moshe Cipro.  Please monitor what you eat.  Your diabetes and cholesterol are looking good.  Would like to avoid increasing or adding any medications in the future.  If you can monitor what you eat, start some physical activity: such as walking on most days of the week for 30 minutes. Would be great in keeping your blood sugar blood pressure and cholesterol within range. See below for some more info.  Additionally, it is recommended by the American Heart Association and [employer] of Cardiologist that a degree of goal exercise at least 150 minutes/week accumulative for moderate intensity is brisk walking.  This can be broken up into various durations throughout the week.  For example 30 minutes on lunch break.  15 minutes in the morning.  15 minutes in the evening.  As long as the cumulative total is 150 minutes.  Diet: Follow the dietary approaches to stop hypertension-DASH DIET and to lower-fat for your cholesterol.   A.  Decreased total fat calories and cholesterol.  B.  Decreased total saturated fats, and replaced with monounsaturated fats such as canola oil all of oil or margarine.  C.  Increase fiber with oatmeal, bran, fiber supplements. D.  Increase daily intake of fruits and vegetables.  Be mindful that canned fruits and canned vegetables could have   added sugar and salt.  E.  Tried garlic, soy protein and vitamin C to help lower your LDL cholesterol.  Limit red meats, egg yolks, cheese and creamy items such as ice cream, creamy sauces, soups and dressings, substituting instead with oil-based salad dressings, broth-based soups, etc.  Avoid fatty/greasy foods and sweets.  We will be thinking of you and your husband.  We hope that your  husband is able to get to good health again and come home to you.  Until then please take care of yourself.  [place] YOUR HANDS WELL AND FREQUENTLY. AVOID TOUCHING YOUR FACE, UNLESS YOUR HANDS ARE FRESHLY WASHED.   GET FRESH AIR DAILY. STAY HYDRATED WITH WATER.   It was a pleasure to see you and I look forward to continuing to work together on your health and well-being. Please do not hesitate to call the office if you need care or have questions about your care.  Have a wonderful day and week.  With Gratitude,  Cherly Beach, DNP, AGNP-BC

## 2018-07-21 NOTE — Progress Notes (Signed)
Virtual Visit via Telephone Note  I connected with Helen Mahoney on 07/21/18 at 10:20 AM EDT by telephone and verified that I am speaking with the correct person using two identifiers.   I discussed the limitations, risks, security and privacy concerns of performing an evaluation and management service by telephone. I also discussed with the patient that there may be a patient responsible charge related to this service. The patient expressed understanding and agreed to proceed.  Location of patient: Home  Patient consents to phone visit  Location of provider: Office   History of Present Illness:  Helen Mahoney is a 68 year old female patient of Dr. Griffin Dakin.  Today on the telephone we discussed some of her comorbidities that she has been managing.  She is recently getting over bronchitis as of earlier this month.  Reports that she is doing much better with that.  Unfortunately her main issue is her husband's current health condition.  As he is hospitalized at [facility] secondary to having a stroke, heart attack, pneumonia, esophageal bleed.  Due to the coded 19 going around she is unable to visit him as of now.  Discussed with labs with her today.  She is doing very well as far as her liver and kidney functions go.  Her cholesterol is doing well she is maintained on pravastatin.  Her diabetes A1c 6.8 has decreased from 7.2 as of last year.  She is much improved.  Her weight today when she was weighing herself while on the phone during the visit was 229 pounds.  This looks like she has lost a few pounds since her last visit.  Reports that this could be related to stress that she is not really watching what she eats at this time.  Overall she reports feeling good, just worried about her husband.  Reports that she has her grandchildren with her at home.  Is watching a 32-year-old now.  Reports that she is happy that her numbers are good.  And that she will be back into the office in 3 months  time to follow-up with Dr. Moshe Cipro in person.  Just for now is going to be continuing to try to monitor her husband's health while she is at home and he is in the hospital.  She denies having any fevers, chills, chest pain, shortness of breath, wheezing, any sinus congestion or other signs of infection today.   Review of Systems  Constitutional: Negative for chills and fever.  HENT: Negative.   Eyes: Negative.   Respiratory: Negative for cough, shortness of breath and wheezing.   Cardiovascular: Negative for chest pain, palpitations and leg swelling.  Gastrointestinal: Negative.   Genitourinary: Negative.   Musculoskeletal: Negative.   Skin: Negative.   Neurological: Negative for dizziness, weakness and headaches.  Endo/Heme/Allergies: Negative.  Negative for polydipsia.  Psychiatric/Behavioral: Negative for depression and memory loss. The patient is not nervous/anxious.   All other systems reviewed and are negative.  Observations/Objective:   Recent Results (from the past 2160 hour(s))  Lipid panel     Status: None   Collection Time: 07/17/18  9:54 AM  Result Value Ref Range   Cholesterol 143 <200 mg/dL   HDL 50 > OR = 50 mg/dL   Triglycerides 68 <150 mg/dL   LDL Cholesterol (Calc) 79 mg/dL (calc)    Comment: Reference range: <100 . Desirable range <100 mg/dL for primary prevention;   <70 mg/dL for patients with CHD or diabetic patients  with > or = 2 CHD  risk factors. Marland Kitchen LDL-C is now calculated using the Martin-Hopkins  calculation, which is a validated novel method providing  better accuracy than the Friedewald equation in the  estimation of LDL-C.  Cresenciano Genre et al. Annamaria Helling. 4580;998(33): 2061-2068  (http://education.[facility].com/faq/FAQ164)    Total CHOL/HDL Ratio 2.9 <5.0 (calc)   Non-HDL Cholesterol (Calc) 93 <130 mg/dL (calc)    Comment: For patients with diabetes plus 1 major ASCVD risk  factor, treating to a non-HDL-C goal of <100 mg/dL  (LDL-C of <70  mg/dL) is considered a therapeutic  option.   COMPLETE METABOLIC PANEL WITH GFR     Status: Abnormal   Collection Time: 07/17/18  9:54 AM  Result Value Ref Range   Glucose, Bld 47 (L) 65 - 99 mg/dL    Comment: .            Fasting reference interval .    BUN 13 7 - 25 mg/dL   Creat 0.97 0.50 - 0.99 mg/dL    Comment: For patients >63 years of age, the reference limit for Creatinine is approximately 13% higher for people identified as African-American. .    GFR, Est Non African American 60 > OR = 60 mL/min/1.69m2   GFR, Est African American 70 > OR = 60 mL/min/1.45m2   BUN/Creatinine Ratio NOT APPLICABLE 6 - 22 (calc)   Sodium 141 135 - 146 mmol/L   Potassium 4.0 3.5 - 5.3 mmol/L   Chloride 105 98 - 110 mmol/L   CO2 25 20 - 32 mmol/L   Calcium 9.2 8.6 - 10.4 mg/dL   Total Protein 6.7 6.1 - 8.1 g/dL   Albumin 3.7 3.6 - 5.1 g/dL   Globulin 3.0 1.9 - 3.7 g/dL (calc)   AG Ratio 1.2 1.0 - 2.5 (calc)   Total Bilirubin 0.4 0.2 - 1.2 mg/dL   Alkaline phosphatase (APISO) 96 37 - 153 U/L   AST 21 10 - 35 U/L   ALT 17 6 - 29 U/L  Hemoglobin A1c     Status: Abnormal   Collection Time: 07/17/18  9:54 AM  Result Value Ref Range   Hgb A1c MFr Bld 6.8 (H) <5.7 % of total Hgb    Comment: For someone without known diabetes, a hemoglobin A1c value of 6.5% or greater indicates that they may have  diabetes and this should be confirmed with a follow-up  test. . For someone with known diabetes, a value <7% indicates  that their diabetes is well controlled and a value  greater than or equal to 7% indicates suboptimal  control. A1c targets should be individualized based on  duration of diabetes, age, comorbid conditions, and  other considerations. . Currently, no consensus exists regarding use of hemoglobin A1c for diagnosis of diabetes for children. .    Mean Plasma Glucose 148 (calc)   eAG (mmol/L) 8.2 (calc)      Assessment and Plan:  1. Morbid obesity ([place]) Denies currently  having any work on a diet, lifestyle changes with activity.  She has a lot of stress on her right now with her husband being hospitalized.  She is encouraged to monitor her diet and lifestyle as she is at home and not out in the bout secondary to the COVID-19. Encourage to continue losing weight, but in a healthy way. Educated her on maintaining the proper eating habits..  2. Diabetes mellitus, insulin dependent (IDDM), uncontrolled ([facility]) A1c is down to 6.8.  Reports that she is taking her medications at this time she has  no complaints of polydipsia, polyphagia, polyuria.  Denies having any headaches, blurred vision.  Reports that she is not really watching what she eats.  Educated again on the need to be on a diabetic diet.  Making sure that she monitors what she intakes as far as carbohydrates and sugar intake.  3. Hyperlipidemia associated with type 2 diabetes mellitus ([place]) Cholesterol looks good overall.  Her LDL is 79.  Would like to see that under 70.  Educated her on the benefits of having that at a low level secondary to having diabetes.  She is on pravastatin and is not having any myopathies or trouble taking the medication at this time.  We will continue this medication.  Further encouraged her to monitor what she takes in, as far as eating because she reports that she is not really monitoring what she eats at this time which means that she is relying on the medications to do the most benefit for her.  Educated her on the benefit of exercising and getting out walking daily.  As well in the benefit of monitoring diet.   Follow Up Instructions:  I discussed the assessment and treatment plan with the patient. The patient was provided an opportunity to ask questions and all were answered. The patient agreed with the plan and demonstrated an understanding of the instructions.  AVS printed.  Will be mailed to patient.  As well as a appointment for 3 months follow-up.   The patient was advised to  call back or seek an in-person evaluation if the symptoms worsen or if the condition fails to improve as anticipated.  I provided 20 minutes of non-face-to-face time during this encounter.   Perlie Mayo, NP

## 2018-07-29 ENCOUNTER — Other Ambulatory Visit (HOSPITAL_COMMUNITY): Payer: Medicare HMO

## 2018-07-31 ENCOUNTER — Ambulatory Visit (HOSPITAL_COMMUNITY): Payer: Medicare HMO

## 2018-07-31 ENCOUNTER — Ambulatory Visit (HOSPITAL_COMMUNITY): Payer: Medicare HMO | Admitting: Hematology

## 2018-08-20 ENCOUNTER — Other Ambulatory Visit: Payer: Self-pay | Admitting: Family Medicine

## 2018-08-27 ENCOUNTER — Inpatient Hospital Stay (HOSPITAL_COMMUNITY): Payer: Medicare HMO | Attending: Internal Medicine

## 2018-08-27 ENCOUNTER — Other Ambulatory Visit: Payer: Self-pay

## 2018-08-27 DIAGNOSIS — D508 Other iron deficiency anemias: Secondary | ICD-10-CM

## 2018-08-27 DIAGNOSIS — D509 Iron deficiency anemia, unspecified: Secondary | ICD-10-CM | POA: Diagnosis not present

## 2018-08-27 DIAGNOSIS — Z7982 Long term (current) use of aspirin: Secondary | ICD-10-CM | POA: Diagnosis not present

## 2018-08-27 DIAGNOSIS — E119 Type 2 diabetes mellitus without complications: Secondary | ICD-10-CM | POA: Insufficient documentation

## 2018-08-27 DIAGNOSIS — Z794 Long term (current) use of insulin: Secondary | ICD-10-CM | POA: Insufficient documentation

## 2018-08-27 DIAGNOSIS — Z7984 Long term (current) use of oral hypoglycemic drugs: Secondary | ICD-10-CM | POA: Insufficient documentation

## 2018-08-27 DIAGNOSIS — I10 Essential (primary) hypertension: Secondary | ICD-10-CM | POA: Diagnosis not present

## 2018-08-27 DIAGNOSIS — E538 Deficiency of other specified B group vitamins: Secondary | ICD-10-CM | POA: Diagnosis not present

## 2018-08-27 DIAGNOSIS — Z79899 Other long term (current) drug therapy: Secondary | ICD-10-CM | POA: Insufficient documentation

## 2018-08-27 LAB — COMPREHENSIVE METABOLIC PANEL
ALT: 18 U/L (ref 0–44)
AST: 16 U/L (ref 15–41)
Albumin: 3.5 g/dL (ref 3.5–5.0)
Alkaline Phosphatase: 115 U/L (ref 38–126)
Anion gap: 13 (ref 5–15)
BUN: 16 mg/dL (ref 8–23)
CO2: 24 mmol/L (ref 22–32)
Calcium: 9.2 mg/dL (ref 8.9–10.3)
Chloride: 105 mmol/L (ref 98–111)
Creatinine, Ser: 0.92 mg/dL (ref 0.44–1.00)
GFR calc Af Amer: >60 mL/min (ref >60)
GFR calc non Af Amer: >60 mL/min (ref >60)
Glucose, Bld: 126 mg/dL — ABNORMAL HIGH (ref 70–99)
Potassium: 3.9 mmol/L (ref 3.5–5.1)
Sodium: 142 mmol/L (ref 135–145)
Total Bilirubin: 0.5 mg/dL (ref 0.3–1.2)
Total Protein: 7.2 g/dL (ref 6.5–8.1)

## 2018-08-27 LAB — CBC WITH DIFFERENTIAL/PLATELET
Abs Immature Granulocytes: 0.07 10*3/uL (ref 0.00–0.07)
Basophils Absolute: 0 10*3/uL (ref 0.0–0.1)
Basophils Relative: 1 %
Eosinophils Absolute: 0.3 10*3/uL (ref 0.0–0.5)
Eosinophils Relative: 3 %
HCT: 35.5 % — ABNORMAL LOW (ref 36.0–46.0)
Hemoglobin: 10.5 g/dL — ABNORMAL LOW (ref 12.0–15.0)
Immature Granulocytes: 1 %
Lymphocytes Relative: 25 %
Lymphs Abs: 2.2 10*3/uL (ref 0.7–4.0)
MCH: 21.3 pg — ABNORMAL LOW (ref 26.0–34.0)
MCHC: 29.6 g/dL — ABNORMAL LOW (ref 30.0–36.0)
MCV: 72.2 fL — ABNORMAL LOW (ref 80.0–100.0)
Monocytes Absolute: 0.7 10*3/uL (ref 0.1–1.0)
Monocytes Relative: 8 %
Neutro Abs: 5.5 10*3/uL (ref 1.7–7.7)
Neutrophils Relative %: 62 %
Platelets: 337 10*3/uL (ref 150–400)
RBC: 4.92 MIL/uL (ref 3.87–5.11)
RDW: 16.9 % — ABNORMAL HIGH (ref 11.5–15.5)
WBC: 8.8 10*3/uL (ref 4.0–10.5)
nRBC: 0 % (ref 0.0–0.2)

## 2018-08-27 LAB — VITAMIN B12: Vitamin B-12: 488 pg/mL (ref 180–914)

## 2018-08-27 LAB — LACTATE DEHYDROGENASE: LDH: 133 U/L (ref 98–192)

## 2018-08-27 LAB — FERRITIN: Ferritin: 102 ng/mL (ref 11–307)

## 2018-08-28 ENCOUNTER — Inpatient Hospital Stay (HOSPITAL_COMMUNITY): Payer: Medicare HMO | Attending: Internal Medicine

## 2018-08-28 ENCOUNTER — Encounter (HOSPITAL_COMMUNITY): Payer: Self-pay

## 2018-08-28 ENCOUNTER — Encounter (HOSPITAL_COMMUNITY): Payer: Self-pay | Admitting: Nurse Practitioner

## 2018-08-28 ENCOUNTER — Inpatient Hospital Stay (HOSPITAL_BASED_OUTPATIENT_CLINIC_OR_DEPARTMENT_OTHER): Payer: Medicare HMO | Admitting: Nurse Practitioner

## 2018-08-28 ENCOUNTER — Encounter: Payer: Self-pay | Admitting: *Deleted

## 2018-08-28 ENCOUNTER — Other Ambulatory Visit: Payer: Self-pay

## 2018-08-28 VITALS — BP 137/84 | HR 98 | Temp 97.9°F | Wt 226.0 lb

## 2018-08-28 DIAGNOSIS — D563 Thalassemia minor: Secondary | ICD-10-CM | POA: Insufficient documentation

## 2018-08-28 DIAGNOSIS — D509 Iron deficiency anemia, unspecified: Secondary | ICD-10-CM

## 2018-08-28 DIAGNOSIS — E785 Hyperlipidemia, unspecified: Secondary | ICD-10-CM

## 2018-08-28 DIAGNOSIS — E119 Type 2 diabetes mellitus without complications: Secondary | ICD-10-CM | POA: Insufficient documentation

## 2018-08-28 DIAGNOSIS — E538 Deficiency of other specified B group vitamins: Secondary | ICD-10-CM | POA: Diagnosis not present

## 2018-08-28 DIAGNOSIS — Z8249 Family history of ischemic heart disease and other diseases of the circulatory system: Secondary | ICD-10-CM | POA: Insufficient documentation

## 2018-08-28 DIAGNOSIS — I10 Essential (primary) hypertension: Secondary | ICD-10-CM

## 2018-08-28 DIAGNOSIS — D508 Other iron deficiency anemias: Secondary | ICD-10-CM

## 2018-08-28 DIAGNOSIS — Z1239 Encounter for other screening for malignant neoplasm of breast: Secondary | ICD-10-CM

## 2018-08-28 LAB — METHYLMALONIC ACID, SERUM: Methylmalonic Acid, Quantitative: 134 nmol/L (ref 0–378)

## 2018-08-28 MED ORDER — CYANOCOBALAMIN 1000 MCG/ML IJ SOLN
INTRAMUSCULAR | Status: AC
  Filled 2018-08-28: qty 1

## 2018-08-28 MED ORDER — CYANOCOBALAMIN 1000 MCG/ML IJ SOLN
1000.0000 ug | Freq: Once | INTRAMUSCULAR | Status: AC
  Administered 2018-08-28: 1000 ug via INTRAMUSCULAR

## 2018-08-28 NOTE — Patient Instructions (Signed)
You received your B-12 injection today.  Follow up as scheduled.

## 2018-08-28 NOTE — Assessment & Plan Note (Addendum)
1.  Microcytic anemia: - This is been longstanding dating back to 2010. - She last had Feraheme 08/04/2015. - She takes oral iron daily without any side effects. - Labs on 08/27/2018 showed her hemoglobin at 10.5, ferritin at 102, and platelets at 337. -Her hemoglobin is stable between 10 and 11. - We will see her back in 4 months with repeat labs.  2.  Vitamin B12 deficiency: - Patient has been getting monthly B12 injections since February 2017. - Labs on 08/27/2018 showed her vitamin B12 level at 488. -We will continue to monitor.  3.  Alpha thalassemia trait: - She had alpha thalassemia genotype study done on 08/17/2015 -Is also an explanation of her persistent microcytosis. -Labs on 08/27/2018 showed her MCV at 72.2.  4.  Health maintenance: -Patient's last mammogram was done on 02/19/2018 - It showed B RADS category 1 negative.  She will have a repeat mammogram in October. -We will see her back after her mammogram. - Patient's last colonoscopy was in 05/15/2015 it showed redundant L colon, diverticulosis, small internal hemorrhoids. -Patient's EGD was in 05/15/2015 which showed gastric polyps, GE junction stricture. - Patient had a capsule study 06/02/2015 which showed occasional erosion in the duodenum.  No masses, ulcers or AVMs seen.  Occasional lymphangectasia.

## 2018-08-28 NOTE — Progress Notes (Signed)
Patient arrives today at the clinic for doctor's appointment and B-12 injection. Her injection was given as ordered.

## 2018-08-28 NOTE — Progress Notes (Signed)
Helen Mahoney, Helen Mahoney [id number]   CLINIC:  Medical Oncology/Hematology  PCP:  Fayrene Helper, MD [address] [place] [address] [phone]   REASON FOR VISIT: Follow-up for iron deficiency anemia.  CURRENT THERAPY: Oral iron with intermittent IV iron infusions.   INTERVAL HISTORY:  Helen Mahoney 68 y.o. female returns for routine follow-up for iron deficiency anemia.  She reports she has been doing well since her last visit.  She reports her husband died this week from a heart attack.  She is trying to handle all of his affairs and under a lot of stress. Denies any nausea, vomiting, or diarrhea. Denies any new pains. Had not noticed any recent bleeding such as epistaxis, hematuria or hematochezia. Denies recent chest pain on exertion, shortness of breath on minimal exertion, pre-syncopal episodes, or palpitations. Denies any numbness or tingling in hands or feet. Denies any recent fevers, infections, or recent hospitalizations. Patient reports appetite at 25% and energy level at 25%.  She is eating well and maintaining her weight at this time.    REVIEW OF SYSTEMS:  Review of Systems  All other systems reviewed and are negative.    PAST MEDICAL/SURGICAL HISTORY:  Past Medical History:  Diagnosis Date  . 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 B12 deficiency 06/16/2015  . Colon polyps   . Diabetes mellitus, type 2 ([place])   . Hyperlipidemia   . Hypertension   . Microcytic anemia 05/18/2015   2013: TCS/EGD 2017: TCS/EGD HYPERPLASTIC GASTRIC POLYPS, LYMPHOCYTIC GASTRITIS   . Obesity    Past Surgical History:  Procedure Laterality Date  . bilateral tubal ligation  1979  . CHOLECYSTECTOMY  2001   ACUTE CHOLECYSTITIS/GALLSTONES  . COLONOSCOPY  05/14/2011   Dr. Oneida Alar: sessile polyp in sigmoid colon, internal hemorrhoids, hyerplastic  polyps  . COLONOSCOPY N/A 05/15/2015   Procedure: COLONOSCOPY;  Surgeon: Danie Binder, MD;  [facility];  Service: Endoscopy;  Laterality: N/A;  0830  . COLONOSCOPY W/ POLYPECTOMY  NOV 2008 [facility] ANEMIA   POLYP NO RETRIEVED  . COLONOSCOPY W/ POLYPECTOMY  2006 DR. SMTH   POLYP?  . ESOPHAGOGASTRODUODENOSCOPY  05/14/2011   Dr. Oneida Alar: sessile polyps in the cardia, mild gastritis. Chronic duodenitis consistent with peptic duodenitis, chronic active H.pylori gastritis.   Marland Kitchen ESOPHAGOGASTRODUODENOSCOPY N/A 05/15/2015   Procedure: ESOPHAGOGASTRODUODENOSCOPY (EGD);  Surgeon: Danie Binder, MD;  [facility];  Service: Endoscopy;  Laterality: N/A;  . GIVENS CAPSULE STUDY N/A 06/02/2015   Procedure: GIVENS CAPSULE STUDY;  Surgeon: Danie Binder, MD;  Location: [facility];  Service: Endoscopy;  Laterality: N/A;  0700  . TUBAL LIGATION    . UPPER GASTROINTESTINAL ENDOSCOPY  NOV 2008 [facility] ANEMIA   NL EXAM, urease neg     SOCIAL HISTORY:  Social History   Socioeconomic History  . Marital status: Married    Spouse name: Not on file  . Number of children: Not on file  . Years of education: Not on file  . Highest education level: Not on file  Occupational History  . Not on file  Social Needs  . Financial resource strain: Not hard at all  . Food insecurity:    Worry: Never true    Inability: Never true  . Transportation needs:    Medical: No    Non-medical: No  Tobacco Use  . Smoking status:  Never Smoker  . Smokeless tobacco: Never Used  Substance and Sexual Activity  . Alcohol use: No  . Drug use: No  . Sexual activity: Not Currently  Lifestyle  . Physical activity:    Days per week: 1 day    Minutes per session: 20 min  . Stress: Not at all  Relationships  . Social connections:    Talks on phone: More than three times a week    Gets together: More than three times a week    Attends religious service: More than 4 times per year    Active member of club or  organization: No    Attends meetings of clubs or organizations: Never    Relationship status: Married  . Intimate partner violence:    Fear of current or ex partner: No    Emotionally abused: No    Physically abused: No    Forced sexual activity: No  Other Topics Concern  . Not on file  Social History Narrative  . Not on file    FAMILY HISTORY:  Family History  Problem Relation Age of Onset  . Heart disease Mother   . Diabetes Mother   . Hypertension Mother   . Stroke Mother   . Breast cancer Mother   . Diabetes Father   . Heart attack Father   . Kidney disease Father   . Hypertension Sister   . Diabetes Sister   . Diabetes Sister   . Mental illness Sister   . Hypertension Sister   . Hepatitis C Sister   . Mental illness Sister   . Diabetes Sister   . Hypertension Sister   . Mental illness Sister   . Colon cancer Sister 18  . Diabetes Brother     CURRENT MEDICATIONS:    ALLERGIES:  Tetanus and penicillin  PHYSICAL EXAM:  ECOG Performance status: 1  Vitals:   08/28/18 0910  BP: 137/84  Pulse: 98  Temp: 97.9 F (36.6 C)  SpO2: 97%   Filed Weights   08/28/18 0910  Weight: 226 lb (102.5 kg)    Physical Exam Constitutional:      Appearance: Normal appearance. She is normal weight.  Cardiovascular:     Rate and Rhythm: Normal rate and regular rhythm.     Heart sounds: Normal heart sounds.  Pulmonary:     Effort: Pulmonary effort is normal.     Breath sounds: Normal breath sounds.  Abdominal:     General: Bowel sounds are normal.     Palpations: Abdomen is soft.  Musculoskeletal: Normal range of motion.  Skin:    General: Skin is warm and dry.  Neurological:     Mental Status: She is alert and oriented to person, place, and time. Mental status is at baseline.  Psychiatric:        Mood and Affect: Mood normal.        Behavior: Behavior normal.        Thought Content: Thought content normal.        Judgment: Judgment normal.       LABORATORY DATA:  I have reviewed the labs as listed.  CBC    Component Value Date/Time   WBC 8.8 08/27/2018 1059   RBC 4.92 08/27/2018 1059   HGB 10.5 (L) 08/27/2018 1059   HCT 35.5 (L) 08/27/2018 1059   PLT 337 08/27/2018 1059   MCV 72.2 (L) 08/27/2018 1059   MCH 21.3 (L) 08/27/2018 1059   MCHC 29.6 (L) 08/27/2018 1059  RDW 16.9 (H) 08/27/2018 1059   LYMPHSABS 2.2 08/27/2018 1059   MONOABS 0.7 08/27/2018 1059   EOSABS 0.3 08/27/2018 1059   BASOSABS 0.0 08/27/2018 1059   CMP Latest Ref Rng & Units 08/27/2018 07/17/2018 02/18/2018  Glucose 70 - 99 mg/dL 126(H) 47(L) 134(H)  BUN 8 - 23 mg/dL 16 13 13   Creatinine 0.44 - 1.00 mg/dL 0.92 0.97 0.96  Sodium 135 - 145 mmol/L 142 141 138  Potassium 3.5 - 5.1 mmol/L 3.9 4.0 4.4  Chloride 98 - 111 mmol/L 105 105 103  CO2 22 - 32 mmol/L 24 25 28   Calcium 8.9 - 10.3 mg/dL 9.2 9.2 9.0  Total Protein 6.5 - 8.1 g/dL 7.2 6.7 7.9  Total Bilirubin 0.3 - 1.2 mg/dL 0.5 0.4 0.6  Alkaline Phos 38 - 126 U/L 115 - 124  AST 15 - 41 U/L 16 21 20   ALT 0 - 44 U/L 18 17 21    I personally performed a face-to-face visit.  All questions were answered to patient's stated satisfaction. Encouraged patient to call with any new concerns or questions before his next visit to the cancer center and we can certain see him sooner, if needed.     ASSESSMENT & PLAN:   IDA (iron deficiency anemia) 1.  Microcytic anemia: - This is been longstanding dating back to 2010. - She last had Feraheme 08/04/2015. - She takes oral iron daily without any side effects. - Labs on 08/27/2018 showed her hemoglobin at 10.5, ferritin at 102, and platelets at 337. -Her hemoglobin is stable between 10 and 11. - We will see her back in 4 months with repeat labs.  2.  Vitamin B12 deficiency: - Patient has been getting monthly B12 injections since February 2017. - Labs on 08/27/2018 showed her vitamin B12 level at 488. -We will continue to monitor.  3.  Alpha thalassemia trait: -  She had alpha thalassemia genotype study done on 08/17/2015 -Is also an explanation of her persistent microcytosis. -Labs on 08/27/2018 showed her MCV at 72.2.  4.  Health maintenance: -Patient's last mammogram was done on 02/19/2018 - It showed B RADS category 1 negative.  She will have a repeat mammogram in October. -We will see her back after her mammogram. - Patient's last colonoscopy was in 05/15/2015 it showed redundant L colon, diverticulosis, small internal hemorrhoids. -Patient's EGD was in 05/15/2015 which showed gastric polyps, GE junction stricture. - Patient had a capsule study 06/02/2015 which showed occasional erosion in the duodenum.  No masses, ulcers or AVMs seen.  Occasional lymphangectasia.      Orders placed this encounter:  Orders Placed This Encounter  Procedures  . MM 3D SCREEN BREAST BILATERAL  . Lactate dehydrogenase  . CBC with Differential/Platelet  . Comprehensive metabolic panel  . Ferritin  . Iron and TIBC  . Vitamin B12  . VITAMIN D 25 Hydroxy (Vit-D Deficiency, Fractures)  . Folate      Francene Finders, FNP-C [place] [phone]

## 2018-08-28 NOTE — Patient Instructions (Signed)
[facility] at [facility] Discharge Instructions  Follow-up in 4 months with repeat labs.   Thank you for choosing [facility] at [facility] to provide your oncology and hematology care.  To afford each patient quality time with our provider, please arrive at least 15 minutes before your scheduled appointment time.   If you have a lab appointment with the Cancer Center please come in thru the  Main Entrance and check in at the main information desk  You need to re-schedule your appointment should you arrive 10 or more minutes late.  We strive to give you quality time with our providers, and arriving late affects you and other patients whose appointments are after yours.  Also, if you no show three or more times for appointments you may be dismissed from the clinic at the providers discretion.     Again, thank you for choosing [facility].  Our hope is that these requests will decrease the amount of time that you wait before being seen by our physicians.       _____________________________________________________________  Should you have questions after your visit to [facility], please contact our office at [phone] between the hours of 8:00 a.m. and 4:30 p.m.  Voicemails left after 4:00 p.m. will not be returned until the following business day.  For prescription refill requests, have your pharmacy contact our office and allow 72 hours.    Cancer Center Support Programs:   > Cancer Support Group  2nd Tuesday of the month 1pm-2pm, Journey Room    

## 2018-09-04 ENCOUNTER — Encounter: Payer: Self-pay | Admitting: *Deleted

## 2018-09-18 ENCOUNTER — Other Ambulatory Visit: Payer: Self-pay | Admitting: Family Medicine

## 2018-09-21 ENCOUNTER — Other Ambulatory Visit: Payer: Self-pay | Admitting: Family Medicine

## 2018-09-24 ENCOUNTER — Telehealth: Payer: Self-pay | Admitting: *Deleted

## 2018-09-24 ENCOUNTER — Other Ambulatory Visit: Payer: Self-pay

## 2018-09-24 MED ORDER — LISINOPRIL 10 MG PO TABS: 10.0000 mg | ORAL_TABLET | Freq: Every day | ORAL | 1 refills | Status: DC

## 2018-09-24 MED ORDER — METFORMIN HCL 1000 MG PO TABS: 1000.0000 mg | ORAL_TABLET | Freq: Two times a day (BID) | ORAL | 1 refills | Status: DC

## 2018-09-24 MED ORDER — GLIPIZIDE 10 MG PO TABS: ORAL_TABLET | ORAL | 1 refills | Status: DC

## 2018-09-24 NOTE — Telephone Encounter (Signed)
meds sent to mail order

## 2018-09-24 NOTE — Telephone Encounter (Signed)
Pt called said lisinopril was sent to [facility] last time and she needs this sent to [facility]. Also said the pharmacy had been trying to contact us regarding her gipizide and metformin. She needed refills on both

## 2018-09-26 ENCOUNTER — Other Ambulatory Visit: Payer: Self-pay | Admitting: Family Medicine

## 2018-09-28 ENCOUNTER — Other Ambulatory Visit: Payer: Self-pay

## 2018-09-28 ENCOUNTER — Inpatient Hospital Stay (HOSPITAL_COMMUNITY): Payer: Medicare HMO | Attending: Internal Medicine

## 2018-09-28 VITALS — BP 131/78 | HR 94 | Resp 16

## 2018-09-28 DIAGNOSIS — E538 Deficiency of other specified B group vitamins: Secondary | ICD-10-CM

## 2018-09-28 MED ORDER — CYANOCOBALAMIN 1000 MCG/ML IJ SOLN
1000.0000 ug | Freq: Once | INTRAMUSCULAR | Status: AC
  Administered 2018-09-28: 1000 ug via INTRAMUSCULAR

## 2018-09-28 MED ORDER — CYANOCOBALAMIN 1000 MCG/ML IJ SOLN
INTRAMUSCULAR | Status: AC
  Filled 2018-09-28: qty 1

## 2018-09-28 NOTE — Patient Instructions (Signed)
[facility] at [facility]  Discharge Instructions:  B12 injection received today. _______________________________________________________________  Thank you for choosing [facility] at [facility] to provide your oncology and hematology care.  To afford each patient quality time with our providers, please arrive at least 15 minutes before your scheduled appointment.  You need to re-schedule your appointment if you arrive 10 or more minutes late.  We strive to give you quality time with our providers, and arriving late affects you and other patients whose appointments are after yours.  Also, if you no show three or more times for appointments you may be dismissed from the clinic.  Again, thank you for choosing [facility] at [facility]. Our hope is that these requests will allow you access to exceptional care and in a timely manner. _______________________________________________________________  If you have questions after your visit, please contact our office at [phone] between the hours of 8:30 a.m. and 5:00 p.m. Voicemails left after 4:30 p.m. will not be returned until the following business day. _______________________________________________________________  For prescription refill requests, have your pharmacy contact our office. _______________________________________________________________  Recommendations made by the consultant and any test results will be sent to your referring physician. _______________________________________________________________ 

## 2018-09-28 NOTE — Progress Notes (Signed)
[name] tolerated B12 injection without incident or complaint. VSS. Discharged self ambulatory in satisfactory condition.

## 2018-10-07 ENCOUNTER — Telehealth: Payer: Self-pay

## 2018-10-07 ENCOUNTER — Other Ambulatory Visit: Payer: Self-pay | Admitting: Family Medicine

## 2018-10-07 DIAGNOSIS — IMO0001 Reserved for inherently not codable concepts without codable children: Secondary | ICD-10-CM

## 2018-10-07 DIAGNOSIS — I10 Essential (primary) hypertension: Secondary | ICD-10-CM

## 2018-10-07 DIAGNOSIS — E785 Hyperlipidemia, unspecified: Secondary | ICD-10-CM

## 2018-10-07 DIAGNOSIS — E559 Vitamin D deficiency, unspecified: Secondary | ICD-10-CM

## 2018-10-07 NOTE — Telephone Encounter (Signed)
Labs ordered.

## 2018-10-12 DIAGNOSIS — Z794 Long term (current) use of insulin: Secondary | ICD-10-CM | POA: Diagnosis not present

## 2018-10-12 DIAGNOSIS — E1165 Type 2 diabetes mellitus with hyperglycemia: Secondary | ICD-10-CM | POA: Diagnosis not present

## 2018-10-12 DIAGNOSIS — I10 Essential (primary) hypertension: Secondary | ICD-10-CM | POA: Diagnosis not present

## 2018-10-13 ENCOUNTER — Other Ambulatory Visit: Payer: Self-pay

## 2018-10-13 ENCOUNTER — Ambulatory Visit: Payer: Medicare HMO | Admitting: Family Medicine

## 2018-10-13 LAB — CBC
HCT: 34.7 % — ABNORMAL LOW (ref 35.0–45.0)
Hemoglobin: 10.6 g/dL — ABNORMAL LOW (ref 11.7–15.5)
MCH: 22.1 pg — ABNORMAL LOW (ref 27.0–33.0)
MCHC: 30.5 g/dL — ABNORMAL LOW (ref 32.0–36.0)
MCV: 72.3 fL — ABNORMAL LOW (ref 80.0–100.0)
MPV: 10.1 fL (ref 7.5–12.5)
Platelets: 355 10*3/uL (ref 140–400)
RBC: 4.8 10*6/uL (ref 3.80–5.10)
RDW: 15.2 % — ABNORMAL HIGH (ref 11.0–15.0)
WBC: 8.6 10*3/uL (ref 3.8–10.8)

## 2018-10-13 LAB — BASIC METABOLIC PANEL WITHOUT GFR
BUN: 11 mg/dL (ref 7–25)
CO2: 29 mmol/L (ref 20–32)
Calcium: 9.1 mg/dL (ref 8.6–10.4)
Chloride: 104 mmol/L (ref 98–110)
Creat: 0.79 mg/dL (ref 0.50–0.99)
GFR, Est African American: 89 mL/min/{1.73_m2}
GFR, Est Non African American: 77 mL/min/{1.73_m2}
Glucose, Bld: 71 mg/dL (ref 65–139)
Potassium: 4.2 mmol/L (ref 3.5–5.3)
Sodium: 140 mmol/L (ref 135–146)

## 2018-10-13 LAB — HEMOGLOBIN A1C
Hgb A1c MFr Bld: 6.6 %{Hb} — ABNORMAL HIGH
Mean Plasma Glucose: 143 (calc)
eAG (mmol/L): 7.9 (calc)

## 2018-10-13 LAB — TSH: TSH: 2.57 m[IU]/L (ref 0.40–4.50)

## 2018-10-21 ENCOUNTER — Ambulatory Visit: Payer: Medicare HMO | Admitting: Family Medicine

## 2018-10-26 ENCOUNTER — Other Ambulatory Visit: Payer: Self-pay

## 2018-10-26 ENCOUNTER — Ambulatory Visit (INDEPENDENT_AMBULATORY_CARE_PROVIDER_SITE_OTHER): Payer: Medicare HMO | Admitting: Family Medicine

## 2018-10-26 ENCOUNTER — Encounter: Payer: Self-pay | Admitting: Family Medicine

## 2018-10-26 VITALS — BP 118/78 | HR 91 | Temp 98.2°F | Resp 14 | Ht 66.0 in | Wt 227.0 lb

## 2018-10-26 DIAGNOSIS — Z794 Long term (current) use of insulin: Secondary | ICD-10-CM | POA: Diagnosis not present

## 2018-10-26 DIAGNOSIS — I10 Essential (primary) hypertension: Secondary | ICD-10-CM

## 2018-10-26 DIAGNOSIS — E785 Hyperlipidemia, unspecified: Secondary | ICD-10-CM

## 2018-10-26 DIAGNOSIS — E1159 Type 2 diabetes mellitus with other circulatory complications: Secondary | ICD-10-CM | POA: Insufficient documentation

## 2018-10-26 DIAGNOSIS — E119 Type 2 diabetes mellitus without complications: Secondary | ICD-10-CM | POA: Diagnosis not present

## 2018-10-26 DIAGNOSIS — E1129 Type 2 diabetes mellitus with other diabetic kidney complication: Secondary | ICD-10-CM | POA: Insufficient documentation

## 2018-10-26 DIAGNOSIS — IMO0001 Reserved for inherently not codable concepts without codable children: Secondary | ICD-10-CM

## 2018-10-26 MED ORDER — RELION PEN NEEDLES 31G X 8 MM MISC: 5 refills | Status: DC

## 2018-10-26 NOTE — Assessment & Plan Note (Signed)
Over corrected, reduce insulin dose gradually as able [name] is reminded of the importance of commitment to daily physical activity for 30 minutes or more, as able and the need to limit carbohydrate intake to 30 to 60 grams per meal to help with blood sugar control.   The need to take medication as prescribed, test blood sugar as directed, and to call between visits if there is a concern that blood sugar is uncontrolled is also discussed.   [name] is reminded of the importance of daily foot exam, annual eye examination, and good blood sugar, blood pressure and cholesterol control.  Diabetic Labs Latest Ref Rng & Units 10/12/2018 08/27/2018 07/17/2018 03/19/2018 02/18/2018  HbA1c <5.7 % of total Hgb 6.6(H) - 6.8(H) 7.2(H) -  Microalbumin mg/dL - - - 2.6 -  Micro/Creat Ratio 0.0 - 30.0 mg/g creat - - - - -  Chol <200 mg/dL - - 143 170 -  HDL > OR = 50 mg/dL - - 50 49(L) -  Calc LDL mg/dL (calc) - - 79 103(H) -  Triglycerides <150 mg/dL - - 68 86 -  Creatinine 0.50 - 0.99 mg/dL 0.79 0.92 0.97 - 0.96   BP/Weight 10/26/2018 09/28/2018 08/28/2018 07/01/2018 06/30/2018 06/01/2018 10/18/2977  Systolic BP 892 119 [phone] 563  Diastolic BP 78 78 84 70 83 69 72  Wt. (Lbs) 227 - 226 232.04 - - 238  BMI 36.64 - 36.48 37.45 - - 38.41   Foot/eye exam completion dates Latest Ref Rng & Units 05/25/2018 05/21/2017  Eye Exam No Retinopathy - -  Foot exam Order - - -  Foot Form Completion - Done Done

## 2018-10-26 NOTE — Progress Notes (Signed)
Helen Mahoney     MRN: [id number]      DOB: Jan 12, 1951   HPI Helen Mahoney is here for follow up and re-evaluation of chronic medical conditions, medication management and review of any available recent lab and radiology data.  Preventive health is updated, specifically  Cancer screening and Immunization.   Questions or concerns regarding consultations or procedures which the PT has had in the interim are  addressed. The PT denies any adverse reactions to current medications since the last visit.  There are no new concerns.  There are no specific complaints   ROS Denies recent fever or chills. Denies sinus pressure, nasal congestion, ear pain or sore throat. Denies chest congestion, productive cough or wheezing. Denies chest pains, palpitations and leg swelling Denies abdominal pain, nausea, vomiting,diarrhea or constipation.   Denies dysuria, frequency, hesitancy or incontinence. Denies joint pain, swelling and limitation in mobility. Denies headaches, seizures, numbness, or tingling. Denies depression, anxiety or insomnia. Denies skin break down or rash.   PE  BP 118/78   Pulse 91   Temp 98.2 F (36.8 C) (Temporal)   Resp 14   Wt 227 lb (103 kg)   SpO2 97%   BMI 36.64 kg/m   Patient alert and oriented and in no cardiopulmonary distress.  HEENT: No facial asymmetry, EOMI,   oropharynx pink and moist.  Neck supple no JVD, no mass.  Chest: Clear to auscultation bilaterally.  CVS: S1, S2 no murmurs, no S3.Regular rate.  ABD: Soft non tender.   Ext: No edema  MS: Adequate ROM spine, shoulders, hips and knees.  Skin: Intact, no ulcerations or rash noted.  Psych: Good eye contact, normal affect. Memory intact not anxious or depressed appearing.  CNS: CN 2-12 intact, power,  normal throughout.no focal deficits noted.   Assessment & Plan  Essential hypertension Controlled, no change in medication DASH diet and commitment to daily physical activity for a  minimum of 30 minutes discussed and encouraged, as a part of hypertension management. The importance of attaining a healthy weight is also discussed.  BP/Weight 10/26/2018 09/28/2018 08/28/2018 07/01/2018 06/30/2018 06/01/2018 6/41/5830  Systolic BP 940 768 088 110 315 945 859  Diastolic BP 78 78 84 70 83 69 72  Wt. (Lbs) 227 - 226 232.04 - - 238  BMI 36.64 - 36.48 37.45 - - 38.41       Diabetes mellitus, insulin dependent (IDDM), uncontrolled ([place]) Over corrected. Reduce insulin to 40 units, then 37 if possiblre Helen Mahoney is reminded of the importance of commitment to daily physical activity for 30 minutes or more, as able and the need to limit carbohydrate intake to 30 to 60 grams per meal to help with blood sugar control.   The need to take medication as prescribed, test blood sugar as directed, and to call between visits if there is a concern that blood sugar is uncontrolled is also discussed.   Helen Mahoney is reminded of the importance of daily foot exam, annual eye examination, and good blood sugar, blood pressure and cholesterol control.  Diabetic Labs Latest Ref Rng & Units 10/12/2018 08/27/2018 07/17/2018 03/19/2018 02/18/2018  HbA1c <5.7 % of total Hgb 6.6(H) - 6.8(H) 7.2(H) -  Microalbumin mg/dL - - - 2.6 -  Micro/Creat Ratio 0.0 - 30.0 mg/g creat - - - - -  Chol <200 mg/dL - - 143 170 -  HDL > OR = 50 mg/dL - - 50 49(L) -  Calc LDL mg/dL (calc) - - 79  103(H) -  Triglycerides <150 mg/dL - - 68 86 -  Creatinine 0.50 - 0.99 mg/dL 0.79 0.92 0.97 - 0.96   BP/Weight 10/26/2018 09/28/2018 08/28/2018 07/01/2018 06/30/2018 06/01/2018 5/53/7482  Systolic BP 707 867 544 920 100 712 197  Diastolic BP 78 78 84 70 83 69 72  Wt. (Lbs) 227 - 226 232.04 - - 238  BMI 36.64 - 36.48 37.45 - - 38.41   Foot/eye exam completion dates Latest Ref Rng & Units 05/25/2018 05/21/2017  Eye Exam No Retinopathy - -  Foot exam Order - - -  Foot Form Completion - Done Done        Morbid obesity Diabetes associated  with hypertension and diabetes  Patient re-educated about  the importance of commitment to a  minimum of 150 minutes of exercise per week as able.  The importance of healthy food choices with portion control discussed, as well as eating regularly and within a 12 hour window most days. The need to choose "clean , green" food 50 to 75% of the time is discussed, as well as to make water the primary drink and set a goal of 64 ounces water daily.    Weight /BMI 10/26/2018 08/28/2018 07/01/2018  WEIGHT 227 lb 226 lb 232 lb 0.6 oz  HEIGHT - - 5\' 6"   BMI 36.64 kg/m2 36.48 kg/m2 37.45 kg/m2      Hyperlipidemia LDL goal <100 Hyperlipidemia:Low fat diet discussed and encouraged.   Lipid Panel  Lab Results  Component Value Date   CHOL 143 07/17/2018   HDL 50 07/17/2018   LDLCALC 79 07/17/2018   TRIG 68 07/17/2018   CHOLHDL 2.9 07/17/2018   Updated lab needed at/ before next visit.

## 2018-10-26 NOTE — Assessment & Plan Note (Signed)
Over corrected. Reduce insulin to 40 units, then 37 if possiblre Ms. Engelson is reminded of the importance of commitment to daily physical activity for 30 minutes or more, as able and the need to limit carbohydrate intake to 30 to 60 grams per meal to help with blood sugar control.   The need to take medication as prescribed, test blood sugar as directed, and to call between visits if there is a concern that blood sugar is uncontrolled is also discussed.   Ms. Dondero is reminded of the importance of daily foot exam, annual eye examination, and good blood sugar, blood pressure and cholesterol control.  Diabetic Labs Latest Ref Rng & Units 10/12/2018 08/27/2018 07/17/2018 03/19/2018 02/18/2018  HbA1c <5.7 % of total Hgb 6.6(H) - 6.8(H) 7.2(H) -  Microalbumin mg/dL - - - 2.6 -  Micro/Creat Ratio 0.0 - 30.0 mg/g creat - - - - -  Chol <200 mg/dL - - 143 170 -  HDL > OR = 50 mg/dL - - 50 49(L) -  Calc LDL mg/dL (calc) - - 79 103(H) -  Triglycerides <150 mg/dL - - 68 86 -  Creatinine 0.50 - 0.99 mg/dL 0.79 0.92 0.97 - 0.96   BP/Weight 10/26/2018 09/28/2018 08/28/2018 07/01/2018 06/30/2018 06/01/2018 5/40/0867  Systolic BP 619 509 326 712 458 099 833  Diastolic BP 78 78 84 70 83 69 72  Wt. (Lbs) 227 - 226 232.04 - - 238  BMI 36.64 - 36.48 37.45 - - 38.41   Foot/eye exam completion dates Latest Ref Rng & Units 05/25/2018 05/21/2017  Eye Exam No Retinopathy - -  Foot exam Order - - -  Foot Form Completion - Done Done

## 2018-10-26 NOTE — Patient Instructions (Signed)
F/U with MD Nov 28 or after, call if you need me  Sooner   Please get fasting lipid, cmp and EGFR , hBA1C and microalb 11/222 or shortly after   It is important that you exercise regularly at least 30 minutes 5 times a week. If you develop chest pain, have severe difficulty breathing, or feel very tired, stop exercising immediately and seek medical attention    Reduce insulin to 40 units daily, then down to 37 units if possible, call with queastions  Goal for fasting blood sugar ranges from 90  to 120 and 2 hours after any meal or at bedtime should be between 130 to 170  Thanks for choosing [facility], we consider it a privelige to serve you.   Social distancing. Frequent hand washing with soap and water Keeping your hands off of your face. These 3 practices will help to keep both you and your community healthy during this time. Please practice them faithfully! Marland Kitchen

## 2018-10-26 NOTE — Assessment & Plan Note (Signed)
Controlled, no change in medication DASH diet and commitment to daily physical activity for a minimum of 30 minutes discussed and encouraged, as a part of hypertension management. The importance of attaining a healthy weight is also discussed.  BP/Weight 10/26/2018 09/28/2018 08/28/2018 07/01/2018 06/30/2018 06/01/2018 6/81/5947  Systolic BP 076 151 834 373 578 978 478  Diastolic BP 78 78 84 70 83 69 72  Wt. (Lbs) 227 - 226 232.04 - - 238  BMI 36.64 - 36.48 37.45 - - 38.41

## 2018-10-26 NOTE — Assessment & Plan Note (Signed)
Diabetes associated with hypertension and diabetes  Patient re-educated about  the importance of commitment to a  minimum of 150 minutes of exercise per week as able.  The importance of healthy food choices with portion control discussed, as well as eating regularly and within a 12 hour window most days. The need to choose "clean , green" food 50 to 75% of the time is discussed, as well as to make water the primary drink and set a goal of 64 ounces water daily.    Weight /BMI 10/26/2018 08/28/2018 07/01/2018  WEIGHT 227 lb 226 lb 232 lb 0.6 oz  HEIGHT - - 5\' 6"   BMI 36.64 kg/m2 36.48 kg/m2 37.45 kg/m2

## 2018-10-26 NOTE — Assessment & Plan Note (Signed)
Hyperlipidemia:Low fat diet discussed and encouraged.   Lipid Panel  Lab Results  Component Value Date   CHOL 143 07/17/2018   HDL 50 07/17/2018   LDLCALC 79 07/17/2018   TRIG 68 07/17/2018   CHOLHDL 2.9 07/17/2018   Updated lab needed at/ before next visit.

## 2018-10-28 ENCOUNTER — Inpatient Hospital Stay (HOSPITAL_COMMUNITY): Payer: Medicare HMO | Attending: Internal Medicine

## 2018-10-28 ENCOUNTER — Encounter (HOSPITAL_COMMUNITY): Payer: Self-pay

## 2018-10-28 ENCOUNTER — Other Ambulatory Visit: Payer: Self-pay

## 2018-10-28 VITALS — BP 131/76 | HR 86 | Temp 98.1°F | Resp 18

## 2018-10-28 DIAGNOSIS — E538 Deficiency of other specified B group vitamins: Secondary | ICD-10-CM | POA: Insufficient documentation

## 2018-10-28 MED ORDER — CYANOCOBALAMIN 1000 MCG/ML IJ SOLN
1000.0000 ug | Freq: Once | INTRAMUSCULAR | Status: AC
  Administered 2018-10-28: 1000 ug via INTRAMUSCULAR
  Filled 2018-10-28: qty 1

## 2018-10-28 NOTE — Patient Instructions (Signed)
[facility] at [facility]  Discharge Instructions:   _______________________________________________________________  Thank you for choosing [facility] at [facility] to provide your oncology and hematology care.  To afford each patient quality time with our providers, please arrive at least 15 minutes before your scheduled appointment.  You need to re-schedule your appointment if you arrive 10 or more minutes late.  We strive to give you quality time with our providers, and arriving late affects you and other patients whose appointments are after yours.  Also, if you no show three or more times for appointments you may be dismissed from the clinic.  Again, thank you for choosing [facility] at [facility]. Our hope is that these requests will allow you access to exceptional care and in a timely manner. _______________________________________________________________  If you have questions after your visit, please contact our office at [phone] between the hours of 8:30 a.m. and 5:00 p.m. Voicemails left after 4:30 p.m. will not be returned until the following business day. _______________________________________________________________  For prescription refill requests, have your pharmacy contact our office. _______________________________________________________________  Recommendations made by the consultant and any test results will be sent to your referring physician. _______________________________________________________________ 

## 2018-10-28 NOTE — Progress Notes (Signed)
B12 injection  given per orders. Patient tolerated it well without problems. Vitals stable and discharged home from clinic ambulatory. Follow up as scheduled.  

## 2018-11-09 ENCOUNTER — Other Ambulatory Visit: Payer: Self-pay

## 2018-11-09 ENCOUNTER — Other Ambulatory Visit: Payer: Medicare HMO

## 2018-11-09 DIAGNOSIS — Z20822 Contact with and (suspected) exposure to covid-19: Secondary | ICD-10-CM

## 2018-11-09 DIAGNOSIS — R6889 Other general symptoms and signs: Secondary | ICD-10-CM | POA: Diagnosis not present

## 2018-11-13 LAB — NOVEL CORONAVIRUS, NAA: SARS-CoV-2, NAA: NOT DETECTED

## 2018-11-16 ENCOUNTER — Other Ambulatory Visit: Payer: Self-pay | Admitting: Family Medicine

## 2018-11-19 DIAGNOSIS — H52223 Regular astigmatism, bilateral: Secondary | ICD-10-CM | POA: Diagnosis not present

## 2018-11-19 DIAGNOSIS — H524 Presbyopia: Secondary | ICD-10-CM | POA: Diagnosis not present

## 2018-11-27 ENCOUNTER — Other Ambulatory Visit: Payer: Self-pay | Admitting: Family Medicine

## 2018-11-30 ENCOUNTER — Ambulatory Visit: Payer: Medicare HMO

## 2018-12-01 ENCOUNTER — Inpatient Hospital Stay (HOSPITAL_COMMUNITY): Payer: Medicare HMO | Attending: Internal Medicine

## 2018-12-01 ENCOUNTER — Other Ambulatory Visit: Payer: Self-pay

## 2018-12-01 VITALS — BP 141/65 | HR 80 | Temp 97.3°F | Resp 18

## 2018-12-01 DIAGNOSIS — Z79899 Other long term (current) drug therapy: Secondary | ICD-10-CM | POA: Diagnosis not present

## 2018-12-01 DIAGNOSIS — E785 Hyperlipidemia, unspecified: Secondary | ICD-10-CM | POA: Insufficient documentation

## 2018-12-01 DIAGNOSIS — Z833 Family history of diabetes mellitus: Secondary | ICD-10-CM | POA: Insufficient documentation

## 2018-12-01 DIAGNOSIS — Z841 Family history of disorders of kidney and ureter: Secondary | ICD-10-CM | POA: Diagnosis not present

## 2018-12-01 DIAGNOSIS — E538 Deficiency of other specified B group vitamins: Secondary | ICD-10-CM | POA: Diagnosis not present

## 2018-12-01 DIAGNOSIS — Z818 Family history of other mental and behavioral disorders: Secondary | ICD-10-CM | POA: Insufficient documentation

## 2018-12-01 DIAGNOSIS — Z800 Family history of malignant neoplasm of digestive organs: Secondary | ICD-10-CM | POA: Insufficient documentation

## 2018-12-01 DIAGNOSIS — I10 Essential (primary) hypertension: Secondary | ICD-10-CM | POA: Diagnosis not present

## 2018-12-01 DIAGNOSIS — Z8249 Family history of ischemic heart disease and other diseases of the circulatory system: Secondary | ICD-10-CM | POA: Diagnosis not present

## 2018-12-01 DIAGNOSIS — Z823 Family history of stroke: Secondary | ICD-10-CM | POA: Diagnosis not present

## 2018-12-01 DIAGNOSIS — Z803 Family history of malignant neoplasm of breast: Secondary | ICD-10-CM | POA: Insufficient documentation

## 2018-12-01 DIAGNOSIS — D563 Thalassemia minor: Secondary | ICD-10-CM | POA: Diagnosis not present

## 2018-12-01 DIAGNOSIS — D509 Iron deficiency anemia, unspecified: Secondary | ICD-10-CM | POA: Insufficient documentation

## 2018-12-01 MED ORDER — CYANOCOBALAMIN 1000 MCG/ML IJ SOLN
1000.0000 ug | Freq: Once | INTRAMUSCULAR | Status: AC
  Administered 2018-12-01: 1000 ug via INTRAMUSCULAR

## 2018-12-01 NOTE — Patient Instructions (Signed)
[facility]  Discharge Instructions:  B12 injection received today.  Cyanocobalamin, Vitamin B12 injection What is this medicine? CYANOCOBALAMIN (sye an oh koe BAL a min) is a man made form of vitamin B12. Vitamin B12 is used in the growth of healthy blood cells, nerve cells, and proteins in the body. It also helps with the metabolism of fats and carbohydrates. This medicine is used to treat people who can not absorb vitamin B12. This medicine may be used for other purposes; ask your health care provider or pharmacist if you have questions. COMMON BRAND NAME(S): B-12 Compliance Kit, B-12 Injection Kit, Cyomin, LA-12, Nutri-Twelve, Physicians EZ Use B-12, Primabalt What should I tell my health care provider before I take this medicine? They need to know if you have any of these conditions:  kidney disease  Leber's disease  megaloblastic anemia  an unusual or allergic reaction to cyanocobalamin, cobalt, other medicines, foods, dyes, or preservatives  pregnant or trying to get pregnant  breast-feeding How should I use this medicine? This medicine is injected into a muscle or deeply under the skin. It is usually given by a health care professional in a clinic or doctor's office. However, your doctor may teach you how to inject yourself. Follow all instructions. Talk to your pediatrician regarding the use of this medicine in children. Special care may be needed. Overdosage: If you think you have taken too much of this medicine contact a poison control center or emergency room at once. NOTE: This medicine is only for you. Do not share this medicine with others. What if I miss a dose? If you are given your dose at a clinic or doctor's office, call to reschedule your appointment. If you give your own injections and you miss a dose, take it as soon as you can. If it is almost time for your next dose, take only that dose. Do not take double or extra  doses. What may interact with this medicine?  colchicine  heavy alcohol intake This list may not describe all possible interactions. Give your health care provider a list of all the medicines, herbs, non-prescription drugs, or dietary supplements you use. Also tell them if you smoke, drink alcohol, or use illegal drugs. Some items may interact with your medicine. What should I watch for while using this medicine? Visit your doctor or health care professional regularly. You may need blood work done while you are taking this medicine. You may need to follow a special diet. Talk to your doctor. Limit your alcohol intake and avoid smoking to get the best benefit. What side effects may I notice from receiving this medicine? Side effects that you should report to your doctor or health care professional as soon as possible:  allergic reactions like skin rash, itching or hives, swelling of the face, lips, or tongue  blue tint to skin  chest tightness, pain  difficulty breathing, wheezing  dizziness  red, swollen painful area on the leg Side effects that usually do not require medical attention (report to your doctor or health care professional if they continue or are bothersome):  diarrhea  headache This list may not describe all possible side effects. Call your doctor for medical advice about side effects. You may report side effects to FDA at 1-800-FDA-1088. Where should I keep my medicine? Keep out of the reach of children. Store at room temperature between 15 and 30 degrees C (59 and 85 degrees F). Protect from light. Throw away   any unused medicine after the expiration date. NOTE: This sheet is a summary. It may not cover all possible information. If you have questions about this medicine, talk to your doctor, pharmacist, or health care provider.  2020 Elsevier/Gold Standard (2007-07-27 22:10:20)  _______________________________________________________________  Thank you for  choosing [facility] at [facility] to provide your oncology and hematology care.  To afford each patient quality time with our providers, please arrive at least 15 minutes before your scheduled appointment.  You need to re-schedule your appointment if you arrive 10 or more minutes late.  We strive to give you quality time with our providers, and arriving late affects you and other patients whose appointments are after yours.  Also, if you no show three or more times for appointments you may be dismissed from the clinic.  Again, thank you for choosing [facility]. Our hope is that these requests will allow you access to exceptional care and in a timely manner. _______________________________________________________________  If you have questions after your visit, please contact our office at [phone] between the hours of 8:30 a.m. and 5:00 p.m. Voicemails left after 4:30 p.m. will not be returned until the following business day. _______________________________________________________________  For prescription refill requests, have your pharmacy contact our office. _______________________________________________________________  Recommendations made by the consultant and any test results will be sent to your referring physician. _______________________________________________________________ 

## 2018-12-01 NOTE — Progress Notes (Signed)
Helen Mahoney presents today for B12 injection. VSS. Injection tolerated without incident or complaint. See MAR for details. Patient discharged in satisfactory condition with follow up instructions.

## 2018-12-02 ENCOUNTER — Encounter: Payer: Self-pay | Admitting: Family Medicine

## 2018-12-02 ENCOUNTER — Ambulatory Visit (INDEPENDENT_AMBULATORY_CARE_PROVIDER_SITE_OTHER): Payer: Medicare HMO | Admitting: Family Medicine

## 2018-12-02 VITALS — BP 118/78 | HR 91 | Resp 14 | Ht 66.0 in | Wt 227.0 lb

## 2018-12-02 DIAGNOSIS — Z0000 Encounter for general adult medical examination without abnormal findings: Secondary | ICD-10-CM

## 2018-12-02 NOTE — Patient Instructions (Addendum)
Helen Mahoney , Thank you for taking time to come for your Medicare Wellness Visit. I appreciate your ongoing commitment to your health goals. Please review the following plan we discussed and let me know if I can assist you in the future.   Please continue to practice social distancing to keep you, your family, and our community safe.  If you must go out, please wear a Mask and practice good handwashing.  Screening recommendations/referrals: Colonoscopy: Due 2022 Mammogram: Due 2020 Bone Density: Completed Recommended yearly ophthalmology/optometry visit for glaucoma screening and checkup Recommended yearly dental visit for hygiene and checkup  Vaccinations: Influenza vaccine: Due Fall 2020 Pneumococcal vaccine: Completed Tdap vaccine: Allergic Shingles vaccine: Completed Zoster 2021   Advanced directives: You reported having the paperwork  Conditions/risks identified: Fall  Next appointment: 04/06/2019   Preventive Care 33 Years and Older, Female Preventive care refers to lifestyle choices and visits with your health care provider that can promote health and wellness. What does preventive care include?  A yearly physical exam. This is also called an annual well check.  Dental exams once or twice a year.  Routine eye exams. Ask your health care provider how often you should have your eyes checked.  Personal lifestyle choices, including:  Daily care of your teeth and gums.  Regular physical activity.  Eating a healthy diet.  Avoiding tobacco and drug use.  Limiting alcohol use.  Practicing safe sex.  Taking low-dose aspirin every day.  Taking vitamin and mineral supplements as recommended by your health care provider. What happens during an annual well check? The services and screenings done by your health care provider during your annual well check will depend on your age, overall health, lifestyle risk factors, and family history of disease. Counseling  Your  health care provider may ask you questions about your:  Alcohol use.  Tobacco use.  Drug use.  Emotional well-being.  Home and relationship well-being.  Sexual activity.  Eating habits.  History of falls.  Memory and ability to understand (cognition).  Work and work Statistician.  Reproductive health. Screening  You may have the following tests or measurements:  Height, weight, and BMI.  Blood pressure.  Lipid and cholesterol levels. These may be checked every 5 years, or more frequently if you are over 29 years old.  Skin check.  Lung cancer screening. You may have this screening every year starting at age 56 if you have a 30-pack-year history of smoking and currently smoke or have quit within the past 15 years.  Fecal occult blood test (FOBT) of the stool. You may have this test every year starting at age 59.  Flexible sigmoidoscopy or colonoscopy. You may have a sigmoidoscopy every 5 years or a colonoscopy every 10 years starting at age 6.  Hepatitis C blood test.  Hepatitis B blood test.  Sexually transmitted disease (STD) testing.  Diabetes screening. This is done by checking your blood sugar (glucose) after you have not eaten for a while (fasting). You may have this done every 1-3 years.  Bone density scan. This is done to screen for osteoporosis. You may have this done starting at age 4.  Mammogram. This may be done every 1-2 years. Talk to your health care provider about how often you should have regular mammograms. Talk with your health care provider about your test results, treatment options, and if necessary, the need for more tests. Vaccines  Your health care provider may recommend certain vaccines, such as:  Influenza vaccine. This  is recommended every year.  Tetanus, diphtheria, and acellular pertussis (Tdap, Td) vaccine. You may need a Td booster every 10 years.  Zoster vaccine. You may need this after age 87.  Pneumococcal 13-valent  conjugate (PCV13) vaccine. One dose is recommended after age 43.  Pneumococcal polysaccharide (PPSV23) vaccine. One dose is recommended after age 80. Talk to your health care provider about which screenings and vaccines you need and how often you need them. This information is not intended to replace advice given to you by your health care provider. Make sure you discuss any questions you have with your health care provider. Document Released: 05/12/2015 Document Revised: 01/03/2016 Document Reviewed: 02/14/2015 Elsevier Interactive Patient Education  2017 [place] Prevention in the Home Falls can cause injuries. They can happen to people of all ages. There are many things you can do to make your home safe and to help prevent falls. What can I do on the outside of my home?  Regularly fix the edges of walkways and driveways and fix any cracks.  Remove anything that might make you trip as you walk through a door, such as a raised step or threshold.  Trim any bushes or trees on the path to your home.  Use bright outdoor lighting.  Clear any walking paths of anything that might make someone trip, such as rocks or tools.  Regularly check to see if handrails are loose or broken. Make sure that both sides of any steps have handrails.  Any raised decks and porches should have guardrails on the edges.  Have any leaves, snow, or ice cleared regularly.  Use sand or salt on walking paths during winter.  Clean up any spills in your garage right away. This includes oil or grease spills. What can I do in the bathroom?  Use night lights.  Install grab bars by the toilet and in the tub and shower. Do not use towel bars as grab bars.  Use non-skid mats or decals in the tub or shower.  If you need to sit down in the shower, use a plastic, non-slip stool.  Keep the floor dry. Clean up any water that spills on the floor as soon as it happens.  Remove soap buildup in the tub or  shower regularly.  Attach bath mats securely with double-sided non-slip rug tape.  Do not have throw rugs and other things on the floor that can make you trip. What can I do in the bedroom?  Use night lights.  Make sure that you have a light by your bed that is easy to reach.  Do not use any sheets or blankets that are too big for your bed. They should not hang down onto the floor.  Have a firm chair that has side arms. You can use this for support while you get dressed.  Do not have throw rugs and other things on the floor that can make you trip. What can I do in the kitchen?  Clean up any spills right away.  Avoid walking on wet floors.  Keep items that you use a lot in easy-to-reach places.  If you need to reach something above you, use a strong step stool that has a grab bar.  Keep electrical cords out of the way.  Do not use floor polish or wax that makes floors slippery. If you must use wax, use non-skid floor wax.  Do not have throw rugs and other things on the floor that can make you  trip. What can I do with my stairs?  Do not leave any items on the stairs.  Make sure that there are handrails on both sides of the stairs and use them. Fix handrails that are broken or loose. Make sure that handrails are as long as the stairways.  Check any carpeting to make sure that it is firmly attached to the stairs. Fix any carpet that is loose or worn.  Avoid having throw rugs at the top or bottom of the stairs. If you do have throw rugs, attach them to the floor with carpet tape.  Make sure that you have a light switch at the top of the stairs and the bottom of the stairs. If you do not have them, ask someone to add them for you. What else can I do to help prevent falls?  Wear shoes that:  Do not have high heels.  Have rubber bottoms.  Are comfortable and fit you well.  Are closed at the toe. Do not wear sandals.  If you use a stepladder:  Make sure that it is fully  opened. Do not climb a closed stepladder.  Make sure that both sides of the stepladder are locked into place.  Ask someone to hold it for you, if possible.  Clearly mark and make sure that you can see:  Any grab bars or handrails.  First and last steps.  Where the edge of each step is.  Use tools that help you move around (mobility aids) if they are needed. These include:  Canes.  Walkers.  Scooters.  Crutches.  Turn on the lights when you go into a dark area. Replace any light bulbs as soon as they burn out.  Set up your furniture so you have a clear path. Avoid moving your furniture around.  If any of your floors are uneven, fix them.  If there are any pets around you, be aware of where they are.  Review your medicines with your doctor. Some medicines can make you feel dizzy. This can increase your chance of falling. Ask your doctor what other things that you can do to help prevent falls. This information is not intended to replace advice given to you by your health care provider. Make sure you discuss any questions you have with your health care provider. Document Released: 02/09/2009 Document Revised: 09/21/2015 Document Reviewed: 05/20/2014 Elsevier Interactive Patient Education  2017 [employer].

## 2018-12-02 NOTE — Progress Notes (Signed)
Subjective:   Helen Mahoney is a 68 y.o. female who presents for Medicare Annual (Subsequent) preventive examination.  Location of Patient: Home Location of Provider: Telehealth Consent was obtain for visit to be over via telehealth. I verified that I am speaking with the correct person using two identifiers.   Review of Systems:    Cardiac Risk Factors include: advanced age (>22men, >12 women);diabetes mellitus;dyslipidemia;hypertension;obesity (BMI >30kg/m2)     Objective:     Vitals: BP 118/78   Pulse 91   Resp 14   Ht 5\' 6"  (1.676 m)   Wt 227 lb (103 kg)   BMI 36.64 kg/m   Body mass index is 36.64 kg/m.  Advanced Directives 12/01/2018 09/28/2018 08/28/2018 06/30/2018 06/01/2018 05/01/2018 03/31/2018  Does Patient Have a Medical Advance Directive? No No No No No No No  Would patient like information on creating a medical advance directive? No - Patient declined No - Patient declined No - Patient declined No - Patient declined No - Patient declined No - Patient declined No - Patient declined  Pre-existing out of facility DNR order (yellow form or pink MOST form) - - - - - - -    Tobacco Social History   Tobacco Use  Smoking Status Never Smoker  Smokeless Tobacco Never Used     Counseling given: Yes   Clinical Intake:  Pre-visit preparation completed: Yes  Pain : No/denies pain Pain Score: 0-No pain     BMI - recorded: 36.64 Nutritional Status: BMI > 30  Obese Nutritional Risks: None Diabetes: Yes CBG done?: No Did pt. bring in CBG monitor from home?: No  How often do you need to have someone help you when you read instructions, pamphlets, or other written materials from your doctor or pharmacy?: 1 - Never What is the last grade level you completed in school?: 12  Interpreter Needed?: No     Past Medical History:  Diagnosis Date  . Alpha-0- thalassemia trait/carrier 05/18/2015   2013: TCS/EGD 2017: TCS/EGD HYPERPLASTIC GASTRIC POLYPS, LYMPHOCYTIC  GASTRITIS   . B12 deficiency 06/16/2015  . Colon polyps   . Diabetes mellitus, type 2 ([place])   . Hyperlipidemia   . Hypertension   . Microcytic anemia 05/18/2015   2013: TCS/EGD 2017: TCS/EGD HYPERPLASTIC GASTRIC POLYPS, LYMPHOCYTIC GASTRITIS   . Obesity    Past Surgical History:  Procedure Laterality Date  . bilateral tubal ligation  1979  . CHOLECYSTECTOMY  2001   ACUTE CHOLECYSTITIS/GALLSTONES  . COLONOSCOPY  05/14/2011   Dr. Oneida Alar: sessile polyp in sigmoid colon, internal hemorrhoids, hyerplastic polyps  . COLONOSCOPY N/A 05/15/2015   Procedure: COLONOSCOPY;  Surgeon: Danie Binder, MD;  [facility];  Service: Endoscopy;  Laterality: N/A;  0830  . COLONOSCOPY W/ POLYPECTOMY  NOV 2008 [facility] ANEMIA   POLYP NO RETRIEVED  . COLONOSCOPY W/ POLYPECTOMY  2006 DR. SMTH   POLYP?  . ESOPHAGOGASTRODUODENOSCOPY  05/14/2011   Dr. Oneida Alar: sessile polyps in the cardia, mild gastritis. Chronic duodenitis consistent with peptic duodenitis, chronic active H.pylori gastritis.   Marland Kitchen ESOPHAGOGASTRODUODENOSCOPY N/A 05/15/2015   Procedure: ESOPHAGOGASTRODUODENOSCOPY (EGD);  Surgeon: Danie Binder, MD;  [facility];  Service: Endoscopy;  Laterality: N/A;  . GIVENS CAPSULE STUDY N/A 06/02/2015   Procedure: GIVENS CAPSULE STUDY;  Surgeon: Danie Binder, MD;  [facility];  Service: Endoscopy;  Laterality: N/A;  0700  . TUBAL LIGATION    . UPPER GASTROINTESTINAL ENDOSCOPY  NOV 2008 [facility] ANEMIA  NL EXAM, urease neg   Family History  Problem Relation Age of Onset  . Heart disease Mother   . Diabetes Mother   . Hypertension Mother   . Stroke Mother   . Breast cancer Mother   . Diabetes Father   . Heart attack Father   . Kidney disease Father   . Hypertension Sister   . Diabetes Sister   . Diabetes Sister   . Mental illness Sister   . Hypertension Sister   . Hepatitis C Sister   . Mental illness Sister   . Diabetes Sister   . Hypertension Sister   . Mental illness  Sister   . Colon cancer Sister 20  . Diabetes Brother    Social History   Socioeconomic History  . Marital status: Married    Spouse name: Not on file  . Number of children: Not on file  . Years of education: Not on file  . Highest education level: Not on file  Occupational History  . Not on file  Social Needs  . Financial resource strain: Not hard at all  . Food insecurity    Worry: Never true    Inability: Never true  . Transportation needs    Medical: No    Non-medical: No  Tobacco Use  . Smoking status: Never Smoker  . Smokeless tobacco: Never Used  Substance and Sexual Activity  . Alcohol use: No  . Drug use: No  . Sexual activity: Not Currently  Lifestyle  . Physical activity    Days per week: 1 day    Minutes per session: 20 min  . Stress: Not at all  Relationships  . Social connections    Talks on phone: More than three times a week    Gets together: More than three times a week    Attends religious service: More than 4 times per year    Active member of club or organization: No    Attends meetings of clubs or organizations: Never    Relationship status: Married  Other Topics Concern  . Not on file  Social History Narrative  . Not on file    Outpatient Encounter Medications as of 12/02/2018  Medication Sig  . aspirin 81 MG tablet Take 81 mg by mouth daily.  . Calcium Citrate-Vitamin D (CITRACAL PETITES/VITAMIN D) 200-250 MG-UNIT TABS Take 1 tablet by mouth daily.   . Cholecalciferol (VITAMIN D3) 2000 units TABS Take 1 tablet by mouth daily.  . Ferrous Sulfate (IRON) 325 (65 FE) MG TABS Take 1 tablet by mouth daily.  Marland Kitchen glipiZIDE (GLUCOTROL) 10 MG tablet TAKE 1 TABLET TWICE DAILY BEFORE MEALS  . Insulin Glargine (LANTUS SOLOSTAR) 100 UNIT/ML Solostar Pen INJECT 50 UNITS SUBCUTANEOUSLY ONCE DAILY AT  10PM (Patient taking differently: 37 Units. INJECT 37 UNITS SUBCUTANEOUSLY ONCE DAILY AT  10PM)  . Insulin Pen Needle (RELION PEN NEEDLE 31G/8MM) 31G X 8 MM  MISC USE 1  ONCE DAILY AS DIRECTED dx e11.65  . lisinopril (ZESTRIL) 10 MG tablet Take 1 tablet (10 mg total) by mouth daily.  . metFORMIN (GLUCOPHAGE) 1000 MG tablet Take 1 tablet (1,000 mg total) by mouth 2 (two) times daily with a meal.  . Multiple Vitamin (MULTIVITAMINS PO) Take 1 tablet by mouth daily.   . pravastatin (PRAVACHOL) 80 MG tablet TAKE 1 TABLET ONE TIME DAILY IN THE EVENING  . TRUE METRIX BLOOD GLUCOSE TEST test strip TEST THREE TIMES DAILY   No facility-administered encounter medications on file as  of 12/02/2018.     Activities of Daily Living In your present state of health, do you have any difficulty performing the following activities: 12/02/2018  Hearing? N  Vision? N  Difficulty concentrating or making decisions? N  Walking or climbing stairs? N  Dressing or bathing? N  Doing errands, shopping? N  Preparing Food and eating ? N  Using the Toilet? N  In the past six months, have you accidently leaked urine? N  Do you have problems with loss of bowel control? N  Managing your Medications? N  Managing your Finances? N  Housekeeping or managing your Housekeeping? N  Some recent data might be hidden    Patient Care Team: Fayrene Helper, MD as PCP - General Fields, Marga Melnick, MD as Consulting Physician (Gastroenterology) Whitney Muse, Kelby Fam, MD (Inactive) as Consulting Physician (Hematology and Oncology)    Assessment:   This is a routine wellness examination for [place].  Exercise Activities and Dietary recommendations Current Exercise Habits: The patient does not participate in regular exercise at present, Exercise limited by: None identified  Goals    . Exercise 3x per week (30 min per time)     Recommend starting a routine exercise program at least 3 days a week for 30-45 minutes at a time as tolerated.      . Increase physical activity     Start walking for 30 mins/5 days a week        Fall Risk Fall Risk  12/02/2018 10/26/2018 07/01/2018 05/25/2018  03/03/2018  Falls in the past year? 0 0 0 0 0  Number falls in past yr: - 0 - - -  Injury with Fall? 0 0 0 - -   Is the patient's home free of loose throw rugs in walkways, pet beds, electrical cords, etc?   yes      Grab bars in the bathroom? yes      Handrails on the stairs?   yes      Adequate lighting?   yes     Depression Screen PHQ 2/9 Scores 12/02/2018 10/26/2018 07/01/2018 05/25/2018  PHQ - 2 Score 0 0 0 0  PHQ- 9 Score - - - -     Cognitive Function     6CIT Screen 12/02/2018 11/25/2017 11/04/2016  What Year? 0 points 0 points 0 points  What month? 0 points 0 points 0 points  What time? 0 points 0 points 0 points  Count back from 20 0 points 0 points 0 points  Months in reverse 0 points 2 points 0 points  Repeat phrase 0 points - 0 points  Total Score 0 - 0    Immunization History  Administered Date(s) Administered  . Influenza Split 02/14/2011, 03/18/2012  . Influenza Whole 02/03/2007, 01/25/2008, 01/20/2009, 01/04/2010  . Influenza, High Dose Seasonal PF 07/01/2018  . Influenza,inj,Quad PF,6+ Mos 02/18/2013, 03/14/2014, 01/12/2015, 02/22/2016, 01/27/2017, 12/18/2017  . Pneumococcal Conjugate-13 07/13/2014  . Pneumococcal Polysaccharide-23 09/14/2009, 09/11/2015  . Zoster 10/16/2010    Qualifies for Shingles Vaccine? completed  Screening Tests Health Maintenance  Topic Date Due  . INFLUENZA VACCINE  01/28/2019 (Originally 11/28/2018)  . OPHTHALMOLOGY EXAM  02/18/2019  . HEMOGLOBIN A1C  04/13/2019  . FOOT EXAM  05/26/2019  . MAMMOGRAM  02/20/2020  . COLONOSCOPY  05/14/2020  . DEXA SCAN  Completed  . Hepatitis C Screening  Completed  . PNA vac Low Risk Adult  Completed  . TETANUS/TDAP  Discontinued    Cancer Screenings: Lung: Low Dose CT  Chest recommended if Age 74-80 years, 30 pack-year currently smoking OR have quit w/in 15years. Patient does not qualify. Breast:  Up to date on Mammogram? Yes   Up to date of Bone Density/Dexa? Yes Colorectal: Due 2022   Additional Screenings:   Hepatitis C Screening:  Completed      Plan:   1. Encounter for Medicare annual wellness exam   I have personally reviewed and noted the following in the patient's chart:   . Medical and social history . Use of alcohol, tobacco or illicit drugs  . Current medications and supplements . Functional ability and status . Nutritional status . Physical activity . Advanced directives . List of other physicians . Hospitalizations, surgeries, and ER visits in previous 12 months . Vitals . Screenings to include cognitive, depression, and falls . Referrals and appointments  In addition, I have reviewed and discussed with patient certain preventive protocols, quality metrics, and best practice recommendations. A written personalized care plan for preventive services as well as general preventive health recommendations were provided to patient.   I provided 20 minutes of non-face-to-face time during this encounter.   Perlie Mayo, NP  12/02/2018

## 2019-01-01 ENCOUNTER — Other Ambulatory Visit: Payer: Self-pay

## 2019-01-01 ENCOUNTER — Inpatient Hospital Stay (HOSPITAL_COMMUNITY): Payer: Medicare HMO | Attending: Internal Medicine

## 2019-01-01 ENCOUNTER — Encounter (HOSPITAL_COMMUNITY): Payer: Self-pay

## 2019-01-01 VITALS — BP 128/65 | HR 80 | Temp 97.7°F | Resp 18

## 2019-01-01 DIAGNOSIS — Z823 Family history of stroke: Secondary | ICD-10-CM | POA: Insufficient documentation

## 2019-01-01 DIAGNOSIS — D509 Iron deficiency anemia, unspecified: Secondary | ICD-10-CM | POA: Insufficient documentation

## 2019-01-01 DIAGNOSIS — E119 Type 2 diabetes mellitus without complications: Secondary | ICD-10-CM | POA: Diagnosis not present

## 2019-01-01 DIAGNOSIS — Z8249 Family history of ischemic heart disease and other diseases of the circulatory system: Secondary | ICD-10-CM | POA: Diagnosis not present

## 2019-01-01 DIAGNOSIS — D563 Thalassemia minor: Secondary | ICD-10-CM | POA: Diagnosis not present

## 2019-01-01 DIAGNOSIS — E538 Deficiency of other specified B group vitamins: Secondary | ICD-10-CM | POA: Diagnosis not present

## 2019-01-01 DIAGNOSIS — Z79899 Other long term (current) drug therapy: Secondary | ICD-10-CM | POA: Diagnosis not present

## 2019-01-01 DIAGNOSIS — Z86010 Personal history of colonic polyps: Secondary | ICD-10-CM | POA: Diagnosis not present

## 2019-01-01 DIAGNOSIS — Z800 Family history of malignant neoplasm of digestive organs: Secondary | ICD-10-CM | POA: Diagnosis not present

## 2019-01-01 DIAGNOSIS — Z794 Long term (current) use of insulin: Secondary | ICD-10-CM | POA: Diagnosis not present

## 2019-01-01 DIAGNOSIS — Z818 Family history of other mental and behavioral disorders: Secondary | ICD-10-CM | POA: Diagnosis not present

## 2019-01-01 DIAGNOSIS — Z8379 Family history of other diseases of the digestive system: Secondary | ICD-10-CM | POA: Diagnosis not present

## 2019-01-01 DIAGNOSIS — Z833 Family history of diabetes mellitus: Secondary | ICD-10-CM | POA: Insufficient documentation

## 2019-01-01 MED ORDER — CYANOCOBALAMIN 1000 MCG/ML IJ SOLN
INTRAMUSCULAR | Status: AC
  Filled 2019-01-01: qty 1

## 2019-01-01 MED ORDER — CYANOCOBALAMIN 1000 MCG/ML IJ SOLN
1000.0000 ug | Freq: Once | INTRAMUSCULAR | Status: AC
  Administered 2019-01-01: 1000 ug via INTRAMUSCULAR

## 2019-01-01 NOTE — Progress Notes (Signed)
Patient tolerated injection with no complaints voiced.  Site clean and dry with no bruising or swelling noted at site.  Band aid applied.  Vss with discharge and left ambulatory with no s/s of distress noted.  

## 2019-01-11 ENCOUNTER — Other Ambulatory Visit: Payer: Self-pay

## 2019-01-11 ENCOUNTER — Ambulatory Visit (INDEPENDENT_AMBULATORY_CARE_PROVIDER_SITE_OTHER): Payer: Medicare HMO

## 2019-01-11 DIAGNOSIS — Z23 Encounter for immunization: Secondary | ICD-10-CM

## 2019-01-19 ENCOUNTER — Other Ambulatory Visit: Payer: Self-pay

## 2019-01-19 MED ORDER — TRUE METRIX BLOOD GLUCOSE TEST VI STRP: ORAL_STRIP | 1 refills | Status: DC

## 2019-01-27 ENCOUNTER — Other Ambulatory Visit: Payer: Self-pay | Admitting: Family Medicine

## 2019-01-29 ENCOUNTER — Encounter (HOSPITAL_COMMUNITY): Payer: Self-pay

## 2019-01-29 ENCOUNTER — Inpatient Hospital Stay (HOSPITAL_COMMUNITY): Payer: Medicare HMO | Attending: Internal Medicine

## 2019-01-29 ENCOUNTER — Other Ambulatory Visit: Payer: Self-pay

## 2019-01-29 VITALS — BP 127/74 | HR 82 | Temp 97.9°F | Resp 18

## 2019-01-29 DIAGNOSIS — Z800 Family history of malignant neoplasm of digestive organs: Secondary | ICD-10-CM | POA: Insufficient documentation

## 2019-01-29 DIAGNOSIS — E119 Type 2 diabetes mellitus without complications: Secondary | ICD-10-CM | POA: Diagnosis not present

## 2019-01-29 DIAGNOSIS — Z8249 Family history of ischemic heart disease and other diseases of the circulatory system: Secondary | ICD-10-CM | POA: Insufficient documentation

## 2019-01-29 DIAGNOSIS — E538 Deficiency of other specified B group vitamins: Secondary | ICD-10-CM | POA: Diagnosis not present

## 2019-01-29 DIAGNOSIS — Z823 Family history of stroke: Secondary | ICD-10-CM | POA: Diagnosis not present

## 2019-01-29 DIAGNOSIS — E785 Hyperlipidemia, unspecified: Secondary | ICD-10-CM | POA: Diagnosis not present

## 2019-01-29 DIAGNOSIS — Z833 Family history of diabetes mellitus: Secondary | ICD-10-CM | POA: Insufficient documentation

## 2019-01-29 DIAGNOSIS — Z794 Long term (current) use of insulin: Secondary | ICD-10-CM | POA: Diagnosis not present

## 2019-01-29 DIAGNOSIS — Z841 Family history of disorders of kidney and ureter: Secondary | ICD-10-CM | POA: Diagnosis not present

## 2019-01-29 DIAGNOSIS — D560 Alpha thalassemia: Secondary | ICD-10-CM | POA: Diagnosis not present

## 2019-01-29 DIAGNOSIS — Z832 Family history of diseases of the blood and blood-forming organs and certain disorders involving the immune mechanism: Secondary | ICD-10-CM | POA: Diagnosis not present

## 2019-01-29 DIAGNOSIS — D509 Iron deficiency anemia, unspecified: Secondary | ICD-10-CM | POA: Diagnosis not present

## 2019-01-29 DIAGNOSIS — Z818 Family history of other mental and behavioral disorders: Secondary | ICD-10-CM | POA: Insufficient documentation

## 2019-01-29 DIAGNOSIS — Z803 Family history of malignant neoplasm of breast: Secondary | ICD-10-CM | POA: Insufficient documentation

## 2019-01-29 DIAGNOSIS — I10 Essential (primary) hypertension: Secondary | ICD-10-CM | POA: Diagnosis not present

## 2019-01-29 DIAGNOSIS — Z79899 Other long term (current) drug therapy: Secondary | ICD-10-CM | POA: Diagnosis not present

## 2019-01-29 MED ORDER — CYANOCOBALAMIN 1000 MCG/ML IJ SOLN
INTRAMUSCULAR | Status: AC
  Filled 2019-01-29: qty 1

## 2019-01-29 MED ORDER — CYANOCOBALAMIN 1000 MCG/ML IJ SOLN
1000.0000 ug | Freq: Once | INTRAMUSCULAR | Status: AC
  Administered 2019-01-29: 10:00:00 1000 ug via INTRAMUSCULAR

## 2019-01-29 NOTE — Progress Notes (Signed)
Helen Mahoney tolerated Vit B12 injection well without complaints or incident. VSS Pt discharged self ambulatory in satisfactory condition 

## 2019-01-29 NOTE — Patient Instructions (Signed)
[facility] at [facility] Discharge Instructions  Received Vit B12 injection today. Follow-up as scheduled. Call clinic for any questions or concerns   Thank you for choosing [facility] at [facility] to provide your oncology and hematology care.  To afford each patient quality time with our provider, please arrive at least 15 minutes before your scheduled appointment time.   If you have a lab appointment with the Cancer Center please come in thru the Main Entrance and check in at the main information desk.  You need to re-schedule your appointment should you arrive 10 or more minutes late.  We strive to give you quality time with our providers, and arriving late affects you and other patients whose appointments are after yours.  Also, if you no show three or more times for appointments you may be dismissed from the clinic at the providers discretion.     Again, thank you for choosing [facility].  Our hope is that these requests will decrease the amount of time that you wait before being seen by our physicians.       _____________________________________________________________  Should you have questions after your visit to [facility], please contact our office at [phone] between the hours of 8:00 a.m. and 4:30 p.m.  Voicemails left after 4:00 p.m. will not be returned until the following business day.  For prescription refill requests, have your pharmacy contact our office and allow 72 hours.    Due to Covid, you will need to wear a mask upon entering the hospital. If you do not have a mask, a mask will be given to you at the Main Entrance upon arrival. For doctor visits, patients may have 1 support person with them. For treatment visits, patients can not have anyone with them due to social distancing guidelines and our immunocompromised population.     

## 2019-02-08 ENCOUNTER — Other Ambulatory Visit: Payer: Self-pay

## 2019-02-08 ENCOUNTER — Other Ambulatory Visit: Payer: Self-pay | Admitting: Family Medicine

## 2019-02-08 MED ORDER — LANTUS SOLOSTAR 100 UNIT/ML ~~LOC~~ SOPN: PEN_INJECTOR | SUBCUTANEOUS | 5 refills | Status: DC

## 2019-02-17 ENCOUNTER — Other Ambulatory Visit: Payer: Self-pay | Admitting: Family Medicine

## 2019-02-22 ENCOUNTER — Other Ambulatory Visit: Payer: Self-pay

## 2019-02-22 ENCOUNTER — Ambulatory Visit (HOSPITAL_COMMUNITY)
Admission: RE | Admit: 2019-02-22 | Discharge: 2019-02-22 | Disposition: A | Discharge status: Home / Self Care | Payer: Medicare HMO | Source: Ambulatory Visit | Attending: Nurse Practitioner | Admitting: Nurse Practitioner

## 2019-02-22 DIAGNOSIS — Z1239 Encounter for other screening for malignant neoplasm of breast: Secondary | ICD-10-CM

## 2019-02-22 DIAGNOSIS — Z1231 Encounter for screening mammogram for malignant neoplasm of breast: Secondary | ICD-10-CM | POA: Diagnosis not present

## 2019-03-01 ENCOUNTER — Other Ambulatory Visit: Payer: Self-pay

## 2019-03-01 ENCOUNTER — Ambulatory Visit (HOSPITAL_COMMUNITY): Payer: Medicare HMO

## 2019-03-01 ENCOUNTER — Inpatient Hospital Stay (HOSPITAL_COMMUNITY): Payer: Medicare HMO | Attending: Hematology

## 2019-03-01 ENCOUNTER — Ambulatory Visit (HOSPITAL_COMMUNITY): Payer: Medicare HMO | Admitting: Nurse Practitioner

## 2019-03-01 DIAGNOSIS — Z8249 Family history of ischemic heart disease and other diseases of the circulatory system: Secondary | ICD-10-CM | POA: Insufficient documentation

## 2019-03-01 DIAGNOSIS — E785 Hyperlipidemia, unspecified: Secondary | ICD-10-CM | POA: Diagnosis not present

## 2019-03-01 DIAGNOSIS — I10 Essential (primary) hypertension: Secondary | ICD-10-CM | POA: Diagnosis not present

## 2019-03-01 DIAGNOSIS — Z803 Family history of malignant neoplasm of breast: Secondary | ICD-10-CM | POA: Insufficient documentation

## 2019-03-01 DIAGNOSIS — E538 Deficiency of other specified B group vitamins: Secondary | ICD-10-CM | POA: Diagnosis not present

## 2019-03-01 DIAGNOSIS — E119 Type 2 diabetes mellitus without complications: Secondary | ICD-10-CM | POA: Diagnosis not present

## 2019-03-01 DIAGNOSIS — Z823 Family history of stroke: Secondary | ICD-10-CM | POA: Insufficient documentation

## 2019-03-01 DIAGNOSIS — D508 Other iron deficiency anemias: Secondary | ICD-10-CM

## 2019-03-01 DIAGNOSIS — Z800 Family history of malignant neoplasm of digestive organs: Secondary | ICD-10-CM | POA: Insufficient documentation

## 2019-03-01 DIAGNOSIS — Z880 Allergy status to penicillin: Secondary | ICD-10-CM | POA: Diagnosis not present

## 2019-03-01 DIAGNOSIS — Z79899 Other long term (current) drug therapy: Secondary | ICD-10-CM | POA: Insufficient documentation

## 2019-03-01 DIAGNOSIS — R718 Other abnormality of red blood cells: Secondary | ICD-10-CM | POA: Diagnosis not present

## 2019-03-01 DIAGNOSIS — E669 Obesity, unspecified: Secondary | ICD-10-CM | POA: Insufficient documentation

## 2019-03-01 DIAGNOSIS — D509 Iron deficiency anemia, unspecified: Secondary | ICD-10-CM | POA: Insufficient documentation

## 2019-03-01 DIAGNOSIS — Z8719 Personal history of other diseases of the digestive system: Secondary | ICD-10-CM | POA: Diagnosis not present

## 2019-03-01 DIAGNOSIS — Z818 Family history of other mental and behavioral disorders: Secondary | ICD-10-CM | POA: Insufficient documentation

## 2019-03-01 DIAGNOSIS — Z86010 Personal history of colonic polyps: Secondary | ICD-10-CM | POA: Insufficient documentation

## 2019-03-01 DIAGNOSIS — Z833 Family history of diabetes mellitus: Secondary | ICD-10-CM | POA: Diagnosis not present

## 2019-03-01 DIAGNOSIS — Z8379 Family history of other diseases of the digestive system: Secondary | ICD-10-CM | POA: Diagnosis not present

## 2019-03-01 DIAGNOSIS — Z794 Long term (current) use of insulin: Secondary | ICD-10-CM | POA: Insufficient documentation

## 2019-03-01 LAB — COMPREHENSIVE METABOLIC PANEL
ALT: 20 U/L (ref 0–44)
AST: 21 U/L (ref 15–41)
Albumin: 3.5 g/dL (ref 3.5–5.0)
Alkaline Phosphatase: 112 U/L (ref 38–126)
Anion gap: 14 (ref 5–15)
BUN: 12 mg/dL (ref 8–23)
CO2: 23 mmol/L (ref 22–32)
Calcium: 8.9 mg/dL (ref 8.9–10.3)
Chloride: 102 mmol/L (ref 98–111)
Creatinine, Ser: 1.03 mg/dL — ABNORMAL HIGH (ref 0.44–1.00)
GFR calc Af Amer: >60 mL/min (ref >60)
GFR calc non Af Amer: 56 mL/min — ABNORMAL LOW (ref >60)
Glucose, Bld: 251 mg/dL — ABNORMAL HIGH (ref 70–99)
Potassium: 3.9 mmol/L (ref 3.5–5.1)
Sodium: 139 mmol/L (ref 135–145)
Total Bilirubin: 0.4 mg/dL (ref 0.3–1.2)
Total Protein: 7.2 g/dL (ref 6.5–8.1)

## 2019-03-01 LAB — CBC WITH DIFFERENTIAL/PLATELET
Abs Immature Granulocytes: 0.08 10*3/uL — ABNORMAL HIGH (ref 0.00–0.07)
Basophils Absolute: 0.1 10*3/uL (ref 0.0–0.1)
Basophils Relative: 1 %
Eosinophils Absolute: 0.2 10*3/uL (ref 0.0–0.5)
Eosinophils Relative: 3 %
HCT: 36.8 % (ref 36.0–46.0)
Hemoglobin: 10.9 g/dL — ABNORMAL LOW (ref 12.0–15.0)
Immature Granulocytes: 1 %
Lymphocytes Relative: 21 %
Lymphs Abs: 1.8 10*3/uL (ref 0.7–4.0)
MCH: 21.6 pg — ABNORMAL LOW (ref 26.0–34.0)
MCHC: 29.6 g/dL — ABNORMAL LOW (ref 30.0–36.0)
MCV: 72.9 fL — ABNORMAL LOW (ref 80.0–100.0)
Monocytes Absolute: 0.4 10*3/uL (ref 0.1–1.0)
Monocytes Relative: 5 %
Neutro Abs: 6 10*3/uL (ref 1.7–7.7)
Neutrophils Relative %: 69 %
Platelets: 348 10*3/uL (ref 150–400)
RBC: 5.05 MIL/uL (ref 3.87–5.11)
RDW: 16.3 % — ABNORMAL HIGH (ref 11.5–15.5)
WBC: 8.6 10*3/uL (ref 4.0–10.5)
nRBC: 0 % (ref 0.0–0.2)

## 2019-03-01 LAB — VITAMIN D 25 HYDROXY (VIT D DEFICIENCY, FRACTURES): Vit D, 25-Hydroxy: 41.39 ng/mL (ref 30–100)

## 2019-03-01 LAB — FOLATE: Folate: 6.9 ng/mL (ref >5.9)

## 2019-03-01 LAB — IRON AND TIBC
Iron: 49 ug/dL (ref 28–170)
Saturation Ratios: 14 % (ref 10.4–31.8)
TIBC: 350 ug/dL (ref 250–450)
UIBC: 301 ug/dL

## 2019-03-01 LAB — LACTATE DEHYDROGENASE: LDH: 142 U/L (ref 98–192)

## 2019-03-01 LAB — VITAMIN B12: Vitamin B-12: 508 pg/mL (ref 180–914)

## 2019-03-01 LAB — FERRITIN: Ferritin: 85 ng/mL (ref 11–307)

## 2019-03-04 ENCOUNTER — Other Ambulatory Visit: Payer: Self-pay

## 2019-03-04 ENCOUNTER — Inpatient Hospital Stay (HOSPITAL_COMMUNITY): Payer: Medicare HMO

## 2019-03-04 ENCOUNTER — Encounter (HOSPITAL_COMMUNITY): Payer: Self-pay | Admitting: Nurse Practitioner

## 2019-03-04 ENCOUNTER — Inpatient Hospital Stay (HOSPITAL_COMMUNITY): Payer: Medicare HMO | Admitting: Nurse Practitioner

## 2019-03-04 DIAGNOSIS — E785 Hyperlipidemia, unspecified: Secondary | ICD-10-CM | POA: Diagnosis not present

## 2019-03-04 DIAGNOSIS — R718 Other abnormality of red blood cells: Secondary | ICD-10-CM | POA: Diagnosis not present

## 2019-03-04 DIAGNOSIS — I10 Essential (primary) hypertension: Secondary | ICD-10-CM | POA: Diagnosis not present

## 2019-03-04 DIAGNOSIS — E538 Deficiency of other specified B group vitamins: Secondary | ICD-10-CM

## 2019-03-04 DIAGNOSIS — Z794 Long term (current) use of insulin: Secondary | ICD-10-CM | POA: Diagnosis not present

## 2019-03-04 DIAGNOSIS — E669 Obesity, unspecified: Secondary | ICD-10-CM | POA: Diagnosis not present

## 2019-03-04 DIAGNOSIS — Z79899 Other long term (current) drug therapy: Secondary | ICD-10-CM | POA: Diagnosis not present

## 2019-03-04 DIAGNOSIS — D509 Iron deficiency anemia, unspecified: Secondary | ICD-10-CM | POA: Diagnosis not present

## 2019-03-04 DIAGNOSIS — E119 Type 2 diabetes mellitus without complications: Secondary | ICD-10-CM | POA: Diagnosis not present

## 2019-03-04 DIAGNOSIS — D508 Other iron deficiency anemias: Secondary | ICD-10-CM

## 2019-03-04 MED ORDER — CYANOCOBALAMIN 1000 MCG/ML IJ SOLN
1000.0000 ug | Freq: Once | INTRAMUSCULAR | Status: AC
  Administered 2019-03-04: 1000 ug via INTRAMUSCULAR
  Filled 2019-03-04: qty 1

## 2019-03-04 NOTE — Patient Instructions (Signed)
[facility] at [facility] Discharge Instructions  Received Vit B12 injection today. Follow-up as scheduled. Call clinic for any questions or concerns   Thank you for choosing [facility] at [facility] to provide your oncology and hematology care.  To afford each patient quality time with our provider, please arrive at least 15 minutes before your scheduled appointment time.   If you have a lab appointment with the Cancer Center please come in thru the Main Entrance and check in at the main information desk.  You need to re-schedule your appointment should you arrive 10 or more minutes late.  We strive to give you quality time with our providers, and arriving late affects you and other patients whose appointments are after yours.  Also, if you no show three or more times for appointments you may be dismissed from the clinic at the providers discretion.     Again, thank you for choosing [facility].  Our hope is that these requests will decrease the amount of time that you wait before being seen by our physicians.       _____________________________________________________________  Should you have questions after your visit to  Cancer Center, please contact our office at [phone] between the hours of 8:00 a.m. and 4:30 p.m.  Voicemails left after 4:00 p.m. will not be returned until the following business day.  For prescription refill requests, have your pharmacy contact our office and allow 72 hours.    Due to Covid, you will need to wear a mask upon entering the hospital. If you do not have a mask, a mask will be given to you at the Main Entrance upon arrival. For doctor visits, patients may have 1 support person with them. For treatment visits, patients can not have anyone with them due to social distancing guidelines and our immunocompromised population.     

## 2019-03-04 NOTE — Progress Notes (Signed)
1055 Pt seen by RLockamy NP and pt approved for Vit B12 injection today per NP                                                              [name] tolerated Vit B12 injection well without complaints or incident. Pt discharged self ambulatory in satisfactory condition

## 2019-03-04 NOTE — Patient Instructions (Signed)
[facility] at [facility] Discharge Instructions  Follow up in 6 months with labs    Thank you for choosing [facility] at [facility] to provide your oncology and hematology care.  To afford each patient quality time with our provider, please arrive at least 15 minutes before your scheduled appointment time.   If you have a lab appointment with the Cancer Center please come in thru the Main Entrance and check in at the main information desk.  You need to re-schedule your appointment should you arrive 10 or more minutes late.  We strive to give you quality time with our providers, and arriving late affects you and other patients whose appointments are after yours.  Also, if you no show three or more times for appointments you may be dismissed from the clinic at the providers discretion.     Again, thank you for choosing [facility].  Our hope is that these requests will decrease the amount of time that you wait before being seen by our physicians.       _____________________________________________________________  Should you have questions after your visit to [facility], please contact our office at [phone] between the hours of 8:00 a.m. and 4:30 p.m.  Voicemails left after 4:00 p.m. will not be returned until the following business day.  For prescription refill requests, have your pharmacy contact our office and allow 72 hours.    Due to Covid, you will need to wear a mask upon entering the hospital. If you do not have a mask, a mask will be given to you at the Main Entrance upon arrival. For doctor visits, patients may have 1 support person with them. For treatment visits, patients can not have anyone with them due to social distancing guidelines and our immunocompromised population.      

## 2019-03-04 NOTE — Assessment & Plan Note (Addendum)
1.  Microcytic anemia: - This is been longstanding dating back to 2010. - She last had Feraheme 08/04/2015. - She takes oral iron daily without any side effects. - Labs on 03/01/2019 showed her hemoglobin at 10.9, ferritin at 85, percent saturation 14 and platelets at 348. -Her hemoglobin is stable between 10 and 11. - We will see her back in 6 months with repeat labs.  2.  Vitamin B12 deficiency: - Patient has been getting monthly B12 injections since February 2017. - Labs on 03/01/2019 showed her vitamin B12 level at 508. -We will continue to monitor.  3.  Alpha thalassemia trait: - She had alpha thalassemia genotype study done on 08/17/2015 -Is also an explanation of her persistent microcytosis. -Labs on 03/01/2019 showed her MCV at 72.9.  4.  Health maintenance: -Patient's last mammogram was done on 02/22/2019 It showed B RADS category 1 negative.  She will have a repeat mammogram in October of 2021 - Patient's last colonoscopy was in 05/15/2015 it showed redundant L colon, diverticulosis, small internal hemorrhoids. -Patient's EGD was in 05/15/2015 which showed gastric polyps, GE junction stricture. - Patient had a capsule study 06/02/2015 which showed occasional erosion in the duodenum.  No masses, ulcers or AVMs seen.  Occasional lymphangectasia.

## 2019-03-04 NOTE — Progress Notes (Signed)
[place], [place] [address]   CLINIC:  Medical Oncology/Hematology  PCP:  Fayrene Helper, MD [address], [address] [place] [address] [phone]   REASON FOR VISIT: Follow-up for for microcytic anemia  CURRENT THERAPY: Oral iron   INTERVAL HISTORY:  Helen Mahoney 68 y.o. female returns for routine follow-up for microcytic anemia.  Patient reports she has been doing well since her last visit.  She has no complaints of fatigue during the day.  She denies any bright red bleeding per rectum or melena.  She denies any easy bruising. Denies any nausea, vomiting, or diarrhea. Denies any new pains. Had not noticed any recent bleeding such as epistaxis, hematuria or hematochezia. Denies recent chest pain on exertion, shortness of breath on minimal exertion, pre-syncopal episodes, or palpitations. Denies any numbness or tingling in hands or feet. Denies any recent fevers, infections, or recent hospitalizations. Patient reports appetite at 100% and energy level at 100%.  She is eating well maintaining her weight at this time.     REVIEW OF SYSTEMS:  Review of Systems  All other systems reviewed and are negative.    PAST MEDICAL/SURGICAL HISTORY:  Past Medical History:  Diagnosis Date  . 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 B12 deficiency 06/16/2015  . Colon polyps   . Diabetes mellitus, type 2 ([place])   . Hyperlipidemia   . Hypertension   . Microcytic anemia 05/18/2015   2013: TCS/EGD 2017: TCS/EGD HYPERPLASTIC GASTRIC POLYPS, LYMPHOCYTIC GASTRITIS   . Obesity    Past Surgical History:  Procedure Laterality Date  . bilateral tubal ligation  1979  . CHOLECYSTECTOMY  2001   ACUTE CHOLECYSTITIS/GALLSTONES  . COLONOSCOPY  05/14/2011   Dr. Oneida Alar: sessile polyp in sigmoid colon, internal hemorrhoids, hyerplastic polyps  . COLONOSCOPY N/A  05/15/2015   Procedure: COLONOSCOPY;  Surgeon: Danie Binder, MD;  [facility];  Service: Endoscopy;  Laterality: N/A;  0830  . COLONOSCOPY W/ POLYPECTOMY  NOV 2008 [facility] ANEMIA   POLYP NO RETRIEVED  . COLONOSCOPY W/ POLYPECTOMY  2006 DR. SMTH   POLYP?  . ESOPHAGOGASTRODUODENOSCOPY  05/14/2011   Dr. Oneida Alar: sessile polyps in the cardia, mild gastritis. Chronic duodenitis consistent with peptic duodenitis, chronic active H.pylori gastritis.   Marland Kitchen ESOPHAGOGASTRODUODENOSCOPY N/A 05/15/2015   Procedure: ESOPHAGOGASTRODUODENOSCOPY (EGD);  Surgeon: Danie Binder, MD;  [facility];  Service: Endoscopy;  Laterality: N/A;  . GIVENS CAPSULE STUDY N/A 06/02/2015   Procedure: GIVENS CAPSULE STUDY;  Surgeon: Danie Binder, MD;  [facility];  Service: Endoscopy;  Laterality: N/A;  0700  . TUBAL LIGATION    . UPPER GASTROINTESTINAL ENDOSCOPY  NOV 2008 [facility] ANEMIA   NL EXAM, urease neg     SOCIAL HISTORY:  Social History   Socioeconomic History  . Marital status: Married    Spouse name: Not on file  . Number of children: Not on file  . Years of education: Not on file  . Highest education level: Not on file  Occupational History  . Not on file  Social Needs  . Financial resource strain: Not hard at all  . Food insecurity    Worry: Never true    Inability: Never true  . Transportation needs    Medical: No    Non-medical: No  Tobacco Use  . Smoking status: Never Smoker  . Smokeless tobacco: Never  Used  Substance and Sexual Activity  . Alcohol use: No  . Drug use: No  . Sexual activity: Not Currently  Lifestyle  . Physical activity    Days per week: 1 day    Minutes per session: 20 min  . Stress: Not at all  Relationships  . Social connections    Talks on phone: More than three times a week    Gets together: More than three times a week    Attends religious service: More than 4 times per year    Active member of club or organization: No    Attends  meetings of clubs or organizations: Never    Relationship status: Married  . Intimate partner violence    Fear of current or ex partner: No    Emotionally abused: No    Physically abused: No    Forced sexual activity: No  Other Topics Concern  . Not on file  Social History Narrative  . Not on file    FAMILY HISTORY:  Family History  Problem Relation Age of Onset  . Heart disease Mother   . Diabetes Mother   . Hypertension Mother   . Stroke Mother   . Breast cancer Mother   . Diabetes Father   . Heart attack Father   . Kidney disease Father   . Hypertension Sister   . Diabetes Sister   . Diabetes Sister   . Mental illness Sister   . Hypertension Sister   . Hepatitis C Sister   . Mental illness Sister   . Diabetes Sister   . Hypertension Sister   . Mental illness Sister   . Colon cancer Sister 49  . Diabetes Brother     CURRENT MEDICATIONS:  Outpatient Encounter Medications as of 03/04/2019  Medication Sig  . aspirin 81 MG tablet Take 81 mg by mouth daily.  . Calcium Citrate-Vitamin D (CITRACAL PETITES/VITAMIN D) 200-250 MG-UNIT TABS Take 1 tablet by mouth daily.   . Cholecalciferol (VITAMIN D3) 2000 units TABS Take 1 tablet by mouth daily.  . Ferrous Sulfate (IRON) 325 (65 FE) MG TABS Take 1 tablet by mouth daily.  Marland Kitchen glipiZIDE (GLUCOTROL) 10 MG tablet TAKE 1 TABLET TWICE DAILY BEFORE MEALS  . glucose blood (TRUE METRIX BLOOD GLUCOSE TEST) test strip TEST THREE TIMES DAILY  . Insulin Glargine (LANTUS SOLOSTAR) 100 UNIT/ML Solostar Pen INJECT 50 UNITS SUBCUTANEOUSLY ONCE DAILY AT  10PM  . Insulin Pen Needle (RELION PEN NEEDLE 31G/8MM) 31G X 8 MM MISC USE 1  ONCE DAILY AS DIRECTED dx e11.65  . lisinopril (ZESTRIL) 10 MG tablet TAKE 1 TABLET EVERY DAY  . metFORMIN (GLUCOPHAGE) 1000 MG tablet TAKE 1 TABLET TWICE DAILY WITH MEALS  . Multiple Vitamin (MULTIVITAMINS PO) Take 1 tablet by mouth daily.   . pravastatin (PRAVACHOL) 80 MG tablet TAKE 1 TABLET ONE TIME DAILY IN THE  EVENING   No facility-administered encounter medications on file as of 03/04/2019.     ALLERGIES:  Tetanus Penicillin   PHYSICAL EXAM:  ECOG Performance status: 1  Vitals:   03/04/19 1013  BP: 129/75  Pulse: 89  Resp: 20  Temp: (!) 97.1 F (36.2 C)  SpO2: 99%   Filed Weights   03/04/19 1013  Weight: 234 lb 14.4 oz (106.5 kg)    Physical Exam Constitutional:      Appearance: Normal appearance. She is normal weight.  Cardiovascular:     Rate and Rhythm: Normal rate and regular rhythm.  Heart sounds: Normal heart sounds.  Pulmonary:     Effort: Pulmonary effort is normal.     Breath sounds: Normal breath sounds.  Abdominal:     General: Bowel sounds are normal.     Palpations: Abdomen is soft.  Musculoskeletal: Normal range of motion.  Skin:    General: Skin is warm.  Neurological:     Mental Status: She is alert and oriented to person, place, and time. Mental status is at baseline.  Psychiatric:        Mood and Affect: Mood normal.        Behavior: Behavior normal.        Thought Content: Thought content normal.        Judgment: Judgment normal.      LABORATORY DATA:  I have reviewed the labs as listed.  CBC    Component Value Date/Time   WBC 8.6 03/01/2019 1112   RBC 5.05 03/01/2019 1112   HGB 10.9 (L) 03/01/2019 1112   HCT 36.8 03/01/2019 1112   PLT 348 03/01/2019 1112   MCV 72.9 (L) 03/01/2019 1112   MCH 21.6 (L) 03/01/2019 1112   MCHC 29.6 (L) 03/01/2019 1112   RDW 16.3 (H) 03/01/2019 1112   LYMPHSABS 1.8 03/01/2019 1112   MONOABS 0.4 03/01/2019 1112   EOSABS 0.2 03/01/2019 1112   BASOSABS 0.1 03/01/2019 1112   CMP Latest Ref Rng & Units 03/01/2019 10/12/2018 08/27/2018  Glucose 70 - 99 mg/dL 251(H) 71 126(H)  BUN 8 - 23 mg/dL 12 11 16   Creatinine 0.44 - 1.00 mg/dL 1.03(H) 0.79 0.92  Sodium 135 - 145 mmol/L 139 140 142  Potassium 3.5 - 5.1 mmol/L 3.9 4.2 3.9  Chloride 98 - 111 mmol/L 102 104 105  CO2 22 - 32 mmol/L 23 29 24   Calcium 8.9  - 10.3 mg/dL 8.9 9.1 9.2  Total Protein 6.5 - 8.1 g/dL 7.2 - 7.2  Total Bilirubin 0.3 - 1.2 mg/dL 0.4 - 0.5  Alkaline Phos 38 - 126 U/L 112 - 115  AST 15 - 41 U/L 21 - 16  ALT 0 - 44 U/L 20 - 18       DIAGNOSTIC IMAGING:  I have independently reviewed the mammogram scans and discussed with the patient.   I personally performed a face-to-face visit.  All questions were answered to patient's stated satisfaction. Encouraged patient to call with any new concerns or questions before his next visit to the cancer center and we can certain see him sooner, if needed.    ASSESSMENT & PLAN:   IDA (iron deficiency anemia) 1.  Microcytic anemia: - This is been longstanding dating back to 2010. - She last had Feraheme 08/04/2015. - She takes oral iron daily without any side effects. - Labs on 03/01/2019 showed her hemoglobin at 10.9, ferritin at 85, percent saturation 14 and platelets at 348. -Her hemoglobin is stable between 10 and 11. - We will see her back in 6 months with repeat labs.  2.  Vitamin B12 deficiency: - Patient has been getting monthly B12 injections since February 2017. - Labs on 03/01/2019 showed her vitamin B12 level at 508. -We will continue to monitor.  3.  Alpha thalassemia trait: - She had alpha thalassemia genotype study done on 08/17/2015 -Is also an explanation of her persistent microcytosis. -Labs on 03/01/2019 showed her MCV at 72.9.  4.  Health maintenance: -Patient's last mammogram was done on 02/22/2019 It showed B RADS category 1 negative.  She will have a  repeat mammogram in October of 2021 - Patient's last colonoscopy was in 05/15/2015 it showed redundant L colon, diverticulosis, small internal hemorrhoids. -Patient's EGD was in 05/15/2015 which showed gastric polyps, GE junction stricture. - Patient had a capsule study 06/02/2015 which showed occasional erosion in the duodenum.  No masses, ulcers or AVMs seen.  Occasional lymphangectasia.      Orders placed  this encounter:  Orders Placed This Encounter  Procedures  . Lactate dehydrogenase  . CBC with Differential/Platelet  . Comprehensive metabolic panel  . Ferritin  . Iron and TIBC  . Vitamin B12  . Vitamin D 25 hydroxy      Francene Finders, FNP-C [facility] [phone]

## 2019-03-06 IMAGING — MG 2D DIGITAL SCREENING BILATERAL MAMMOGRAM WITH CAD AND ADJUNCT TO
8 series · 8 of 24 positions shown · non-contrast
Comparison: Previous exam(s).

ACR Breast Density Category a: The breast tissue is almost entirely
fatty.

CLINICAL DATA: Screening.

EXAM:
2D DIGITAL SCREENING BILATERAL MAMMOGRAM WITH CAD AND ADJUNCT TOMO

[L MLO]
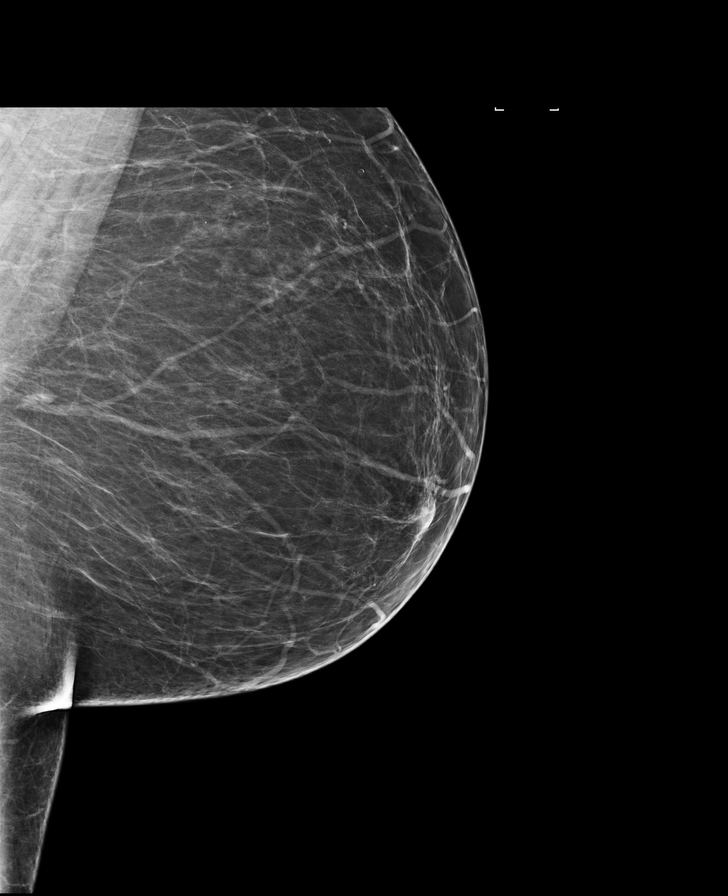

[R MLO]
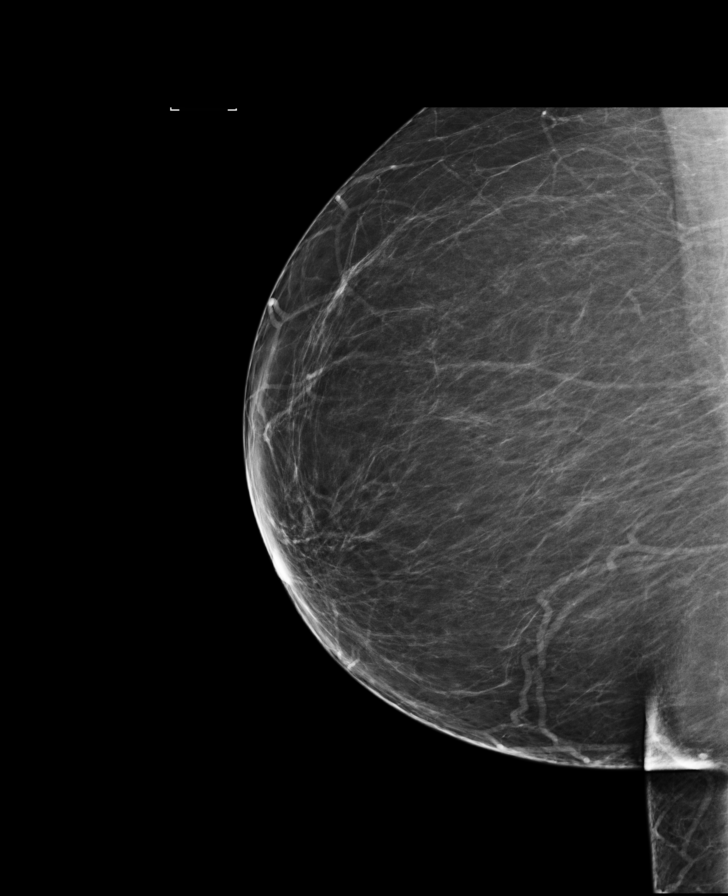

[L CC]
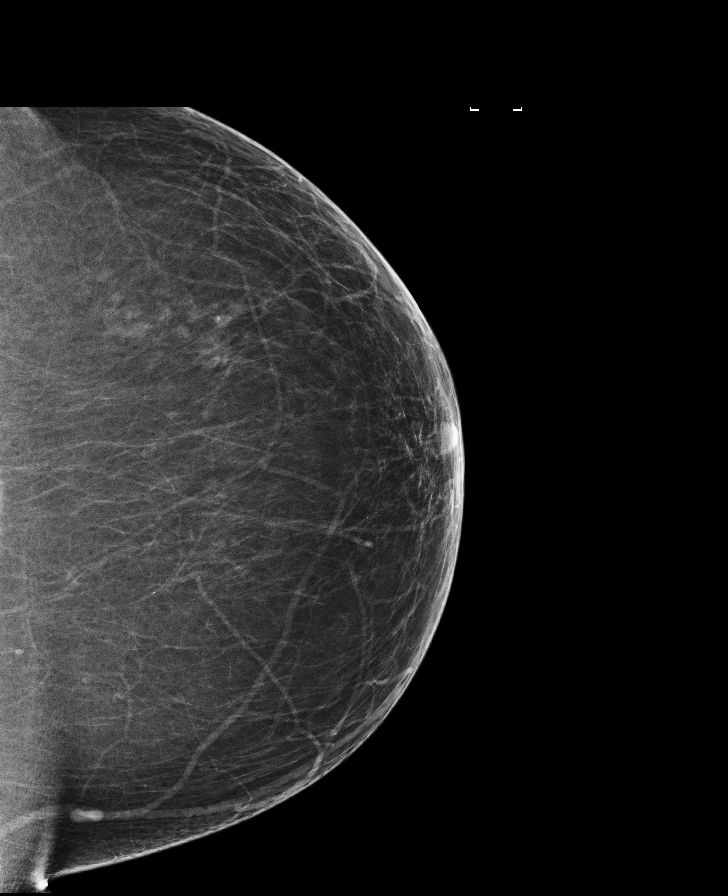

[R CC]
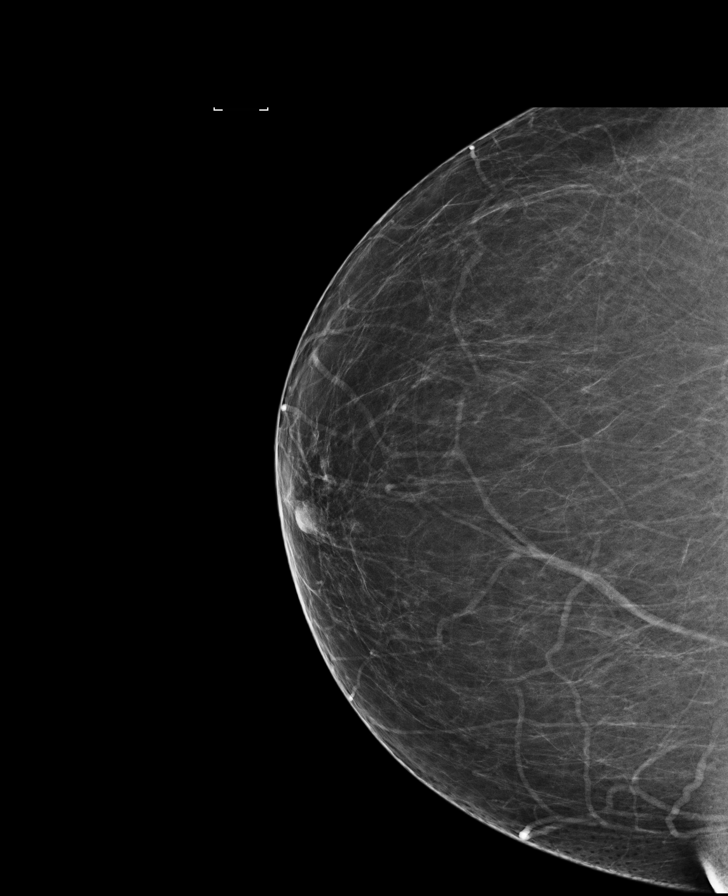

[L MLO tomo · tomo slice 43/84.0]
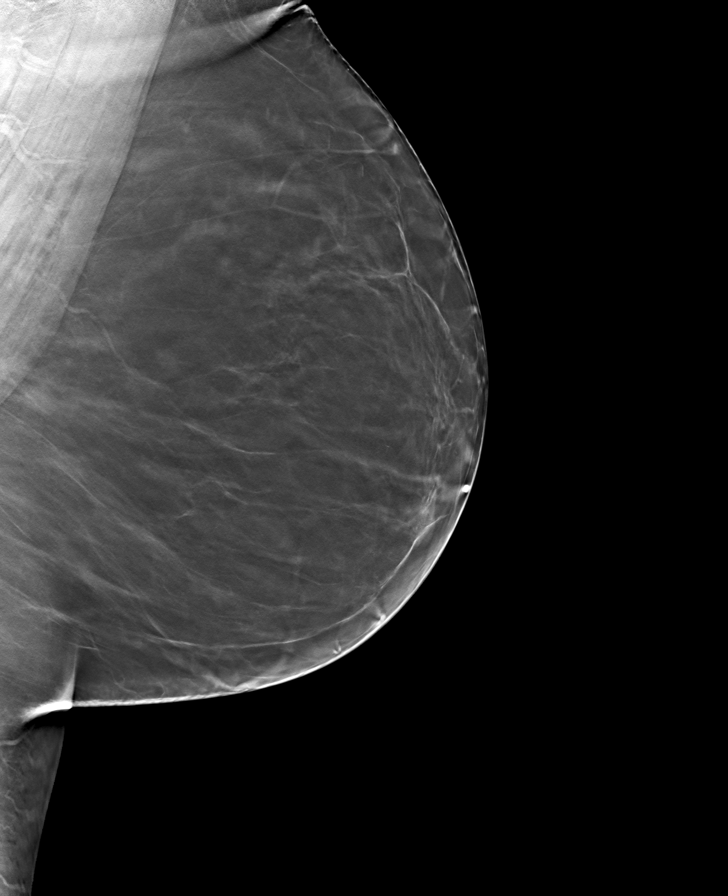

[R MLO tomo · tomo slice 42/83.0]
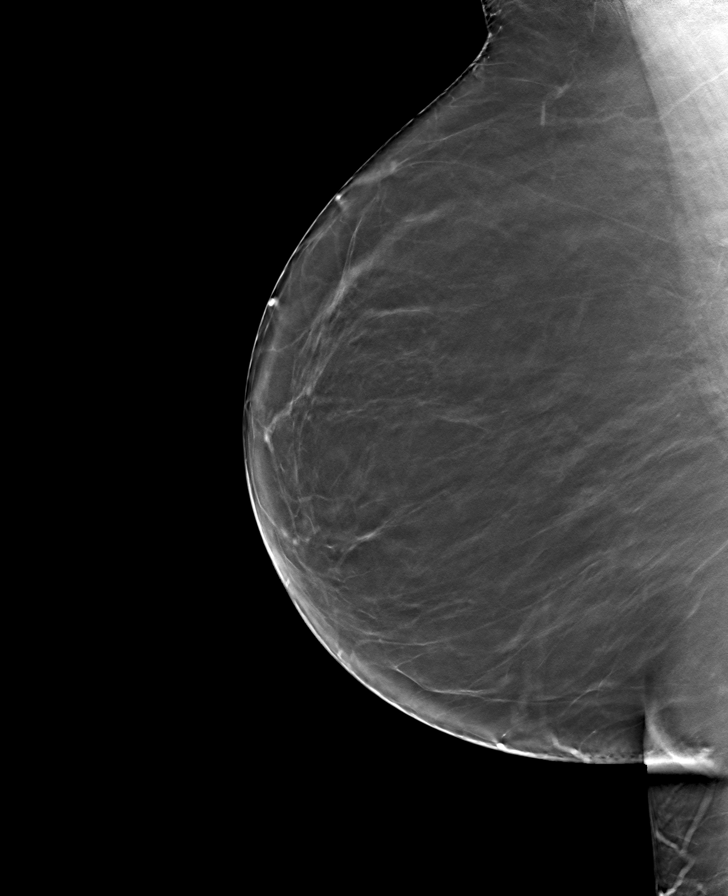

[R CC tomo · tomo slice 37/74.0]
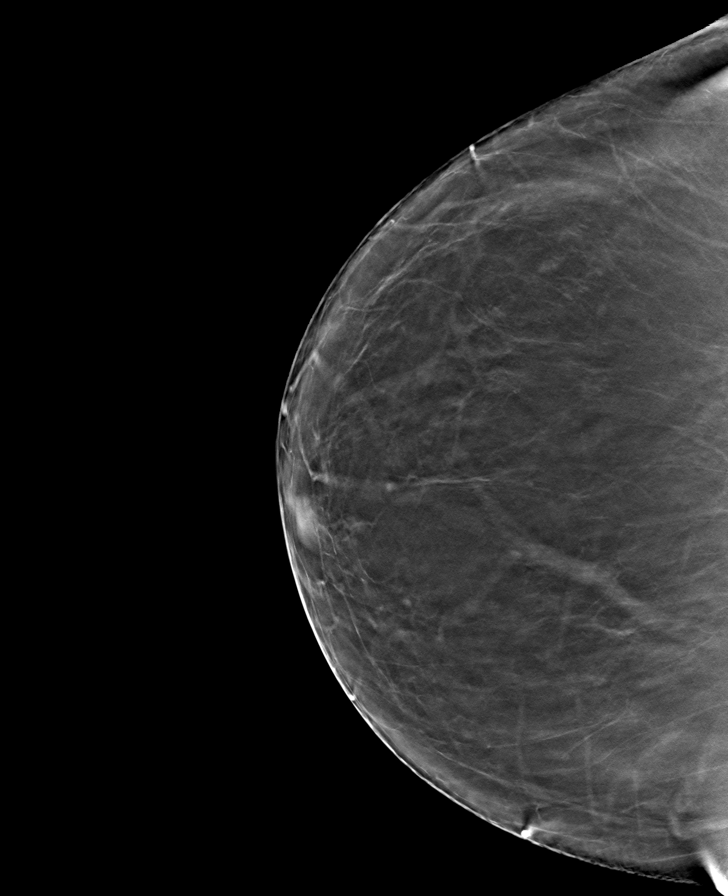

[L CC tomo · tomo slice 39/78.0]
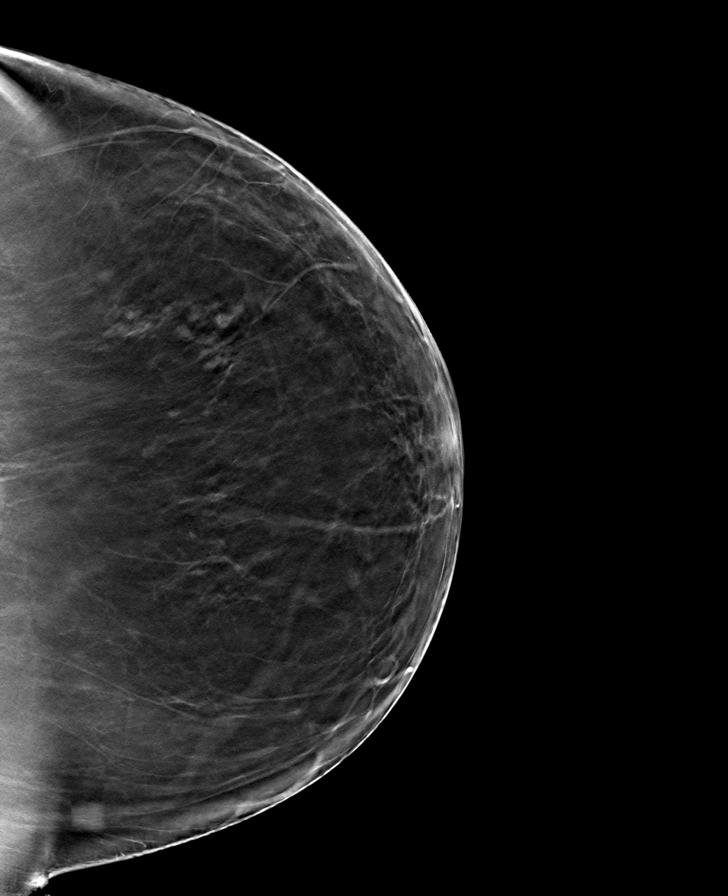

[8 of 24 positions shown; findings below may reference images not displayed]

FINDINGS: There are no findings suspicious for malignancy. Images were
processed with CAD.
IMPRESSION: No mammographic evidence of malignancy. A result letter of this
screening mammogram will be mailed directly to the patient.

RECOMMENDATION:
Screening mammogram in one year. (Code:1B-[id number])

BI-RADS CATEGORY  1: Negative.

## 2019-03-22 DIAGNOSIS — E119 Type 2 diabetes mellitus without complications: Secondary | ICD-10-CM | POA: Diagnosis not present

## 2019-03-22 LAB — HM DIABETES EYE EXAM

## 2019-03-29 ENCOUNTER — Other Ambulatory Visit: Payer: Self-pay | Admitting: Family Medicine

## 2019-03-30 DIAGNOSIS — E785 Hyperlipidemia, unspecified: Secondary | ICD-10-CM | POA: Diagnosis not present

## 2019-03-30 DIAGNOSIS — Z794 Long term (current) use of insulin: Secondary | ICD-10-CM | POA: Diagnosis not present

## 2019-03-30 DIAGNOSIS — E1165 Type 2 diabetes mellitus with hyperglycemia: Secondary | ICD-10-CM | POA: Diagnosis not present

## 2019-03-30 DIAGNOSIS — I10 Essential (primary) hypertension: Secondary | ICD-10-CM | POA: Diagnosis not present

## 2019-03-31 LAB — HEMOGLOBIN A1C
Hgb A1c MFr Bld: 6.5 % of total Hgb — ABNORMAL HIGH (ref <5.7)
Mean Plasma Glucose: 140 (calc)
eAG (mmol/L): 7.7 (calc)

## 2019-03-31 LAB — COMPLETE METABOLIC PANEL WITH GFR
AG Ratio: 1.3 (calc) (ref 1.0–2.5)
ALT: 14 U/L (ref 6–29)
AST: 15 U/L (ref 10–35)
Albumin: 3.7 g/dL (ref 3.6–5.1)
Alkaline phosphatase (APISO): 112 U/L (ref 37–153)
BUN: 10 mg/dL (ref 7–25)
CO2: 29 mmol/L (ref 20–32)
Calcium: 9.1 mg/dL (ref 8.6–10.4)
Chloride: 103 mmol/L (ref 98–110)
Creat: 0.9 mg/dL (ref 0.50–0.99)
GFR, Est African American: 76 mL/min/{1.73_m2} (ref >=60)
GFR, Est Non African American: 66 mL/min/{1.73_m2} (ref >=60)
Globulin: 2.8 g/dL (calc) (ref 1.9–3.7)
Glucose, Bld: 53 mg/dL — ABNORMAL LOW (ref 65–99)
Potassium: 3.9 mmol/L (ref 3.5–5.3)
Sodium: 140 mmol/L (ref 135–146)
Total Bilirubin: 0.3 mg/dL (ref 0.2–1.2)
Total Protein: 6.5 g/dL (ref 6.1–8.1)

## 2019-03-31 LAB — LIPID PANEL
Cholesterol: 174 mg/dL (ref <200)
HDL: 52 mg/dL (ref >=50)
LDL Cholesterol (Calc): 106 mg/dL (calc) — ABNORMAL HIGH
Non-HDL Cholesterol (Calc): 122 mg/dL (calc) (ref <130)
Total CHOL/HDL Ratio: 3.3 (calc) (ref <5.0)
Triglycerides: 71 mg/dL (ref <150)

## 2019-03-31 LAB — MICROALBUMIN, URINE: Microalb, Ur: 0.9 mg/dL

## 2019-04-05 ENCOUNTER — Other Ambulatory Visit: Payer: Self-pay

## 2019-04-05 ENCOUNTER — Inpatient Hospital Stay (HOSPITAL_COMMUNITY): Payer: Medicare HMO | Attending: Internal Medicine

## 2019-04-05 VITALS — BP 140/76 | HR 88 | Temp 97.6°F | Resp 18

## 2019-04-05 DIAGNOSIS — Z8249 Family history of ischemic heart disease and other diseases of the circulatory system: Secondary | ICD-10-CM | POA: Diagnosis not present

## 2019-04-05 DIAGNOSIS — Z803 Family history of malignant neoplasm of breast: Secondary | ICD-10-CM | POA: Diagnosis not present

## 2019-04-05 DIAGNOSIS — D563 Thalassemia minor: Secondary | ICD-10-CM | POA: Insufficient documentation

## 2019-04-05 DIAGNOSIS — Z823 Family history of stroke: Secondary | ICD-10-CM | POA: Diagnosis not present

## 2019-04-05 DIAGNOSIS — Z79899 Other long term (current) drug therapy: Secondary | ICD-10-CM | POA: Insufficient documentation

## 2019-04-05 DIAGNOSIS — Z841 Family history of disorders of kidney and ureter: Secondary | ICD-10-CM | POA: Insufficient documentation

## 2019-04-05 DIAGNOSIS — Z833 Family history of diabetes mellitus: Secondary | ICD-10-CM | POA: Insufficient documentation

## 2019-04-05 DIAGNOSIS — Z818 Family history of other mental and behavioral disorders: Secondary | ICD-10-CM | POA: Insufficient documentation

## 2019-04-05 DIAGNOSIS — E538 Deficiency of other specified B group vitamins: Secondary | ICD-10-CM | POA: Diagnosis not present

## 2019-04-05 DIAGNOSIS — D509 Iron deficiency anemia, unspecified: Secondary | ICD-10-CM | POA: Diagnosis not present

## 2019-04-05 DIAGNOSIS — Z800 Family history of malignant neoplasm of digestive organs: Secondary | ICD-10-CM | POA: Diagnosis not present

## 2019-04-05 MED ORDER — CYANOCOBALAMIN 1000 MCG/ML IJ SOLN
1000.0000 ug | Freq: Once | INTRAMUSCULAR | Status: AC
  Administered 2019-04-05: 15:00:00 1000 ug via INTRAMUSCULAR

## 2019-04-05 MED ORDER — CYANOCOBALAMIN 1000 MCG/ML IJ SOLN
INTRAMUSCULAR | Status: AC
  Filled 2019-04-05: qty 1

## 2019-04-05 NOTE — Progress Notes (Signed)
Helen Mahoney presents today for B12 injection. VSS. Injection tolerated without incident or complaint. See MAR for details. Patient discharged in satisfactory condition with follow up instructions.

## 2019-04-05 NOTE — Patient Instructions (Signed)
[facility]  Discharge Instructions:  B12 injection received today. _______________________________________________________________  Thank you for choosing [facility] to provide your oncology and hematology care.  To afford each patient quality time with our providers, please arrive at least 15 minutes before your scheduled appointment.  You need to re-schedule your appointment if you arrive 10 or more minutes late.  We strive to give you quality time with our providers, and arriving late affects you and other patients whose appointments are after yours.  Also, if you no show three or more times for appointments you may be dismissed from the clinic.  Again, thank you for choosing [facility]. Our hope is that these requests will allow you access to exceptional care and in a timely manner. _______________________________________________________________  If you have questions after your visit, please contact our office at [phone] between the hours of 8:30 a.m. and 5:00 p.m. Voicemails left after 4:30 p.m. will not be returned until the following business day. _______________________________________________________________  For prescription refill requests, have your pharmacy contact our office. _______________________________________________________________  Recommendations made by the consultant and any test results will be sent to your referring physician. _______________________________________________________________ 

## 2019-04-06 ENCOUNTER — Encounter: Payer: Self-pay | Admitting: Family Medicine

## 2019-04-06 ENCOUNTER — Ambulatory Visit (INDEPENDENT_AMBULATORY_CARE_PROVIDER_SITE_OTHER): Payer: Medicare HMO | Admitting: Family Medicine

## 2019-04-06 VITALS — BP 138/70 | Ht 66.0 in | Wt 230.0 lb

## 2019-04-06 DIAGNOSIS — E785 Hyperlipidemia, unspecified: Secondary | ICD-10-CM | POA: Diagnosis not present

## 2019-04-06 DIAGNOSIS — I10 Essential (primary) hypertension: Secondary | ICD-10-CM

## 2019-04-06 DIAGNOSIS — E1159 Type 2 diabetes mellitus with other circulatory complications: Secondary | ICD-10-CM

## 2019-04-06 NOTE — Patient Instructions (Signed)
Annual physical exam inn office with mD first wek in April , call if you need me sooner.  Congratulations on excellent lab results and weight loss, please keep this up and start committing to 15 to 30 minutes at least 5 days/week of exercise.  Please reduce Lantus dose to 30 units daily.  Nonfasting hemoglobin A1c Chem-7 and EGFR to be done 5 to 7 days before next visit.  Best wishes to you and your family for 2021.  Thanks for choosing [facility], we consider it a privelige to serve you.

## 2019-04-06 NOTE — Progress Notes (Signed)
Virtual Visit via Telephone Note  I connected with Helen Mahoney on 04/06/19 at  8:40 AM EST by telephone and verified that I am speaking with the correct person using two identifiers.  Location: Patient: home  Provider: office   I discussed the limitations, risks, security and privacy concerns of performing an evaluation and management service by telephone and the availability of in person appointments. I also discussed with the patient that there may be a patient responsible charge related to this service. The patient expressed understanding and agreed to proceed.   History of Present Illness: Doing well, tests once daily morning  Sugars between 110 and 140 Denies polyuria, polydipsia, blurred vision , or hypoglycemic episodes. No current concerns Denies recent fever or chills. Denies sinus pressure, nasal congestion, ear pain or sore throat. Denies chest congestion, productive cough or wheezing. Denies chest pains, palpitations and leg swelling Denies abdominal pain, nausea, vomiting,diarrhea or constipation.   Denies dysuria, frequency, hesitancy or incontinence. Denies joint pain, swelling and limitation in mobility. Denies headaches, seizures, numbness, or tingling. Denies depression, anxiety or insomnia. Denies skin break down or rash.       Observations/Objective: BP 138/70   Ht 5\' 6"  (1.676 m)   Wt 230 lb (104.3 kg)   BMI 37.12 kg/m  Good communication with no confusion and intact memory. Alert and oriented x 3 No signs of respiratory distress during speech    Assessment and Plan: Type 2 diabetes mellitus with vascular disease ([place]) Improved and well controlled, decrease lantus dose Helen Mahoney is reminded of the importance of commitment to daily physical activity for 30 minutes or more, as able and the need to limit carbohydrate intake to 30 to 60 grams per meal to help with blood sugar control.   The need to take medication as prescribed, test blood  sugar as directed, and to call between visits if there is a concern that blood sugar is uncontrolled is also discussed.   Helen Mahoney is reminded of the importance of daily foot exam, annual eye examination, and good blood sugar, blood pressure and cholesterol control.  Diabetic Labs Latest Ref Rng & Units 03/30/2019 03/01/2019 10/12/2018 08/27/2018 07/17/2018  HbA1c <5.7 % of total Hgb 6.5(H) - 6.6(H) - 6.8(H)  Microalbumin mg/dL 0.9 - - - -  Micro/Creat Ratio 0.0 - 30.0 mg/g creat - - - - -  Chol <200 mg/dL 174 - - - 143  HDL > OR = 50 mg/dL 52 - - - 50  Calc LDL mg/dL (calc) 106(H) - - - 79  Triglycerides <150 mg/dL 71 - - - 68  Creatinine 0.50 - 0.99 mg/dL 0.90 1.03(H) 0.79 0.92 0.97   BP/Weight 04/06/2019 04/05/2019 03/04/2019 01/29/2019 01/01/2019 Q000111Q [id number]  Systolic BP 0000000 [id number] Q000111Q [id number] 0000000 123456 Q000111Q  Diastolic BP 70 76 75 74 65 78 65  Wt. (Lbs) 230 - 234.9 - - 227 -  BMI 37.12 - 37.91 - - 36.64 -   Foot/eye exam completion dates Latest Ref Rng & Units 03/22/2019 05/25/2018  Eye Exam No Retinopathy No Retinopathy -  Foot exam Order - - -  Foot Form Completion - - Done  Updated lab needed at/ before next visit.       Essential hypertension Controlled, no change in medication DASH diet and commitment to daily physical activity for a minimum of 30 minutes discussed and encouraged, as a part of hypertension management. The importance of attaining a healthy weight is also discussed.  BP/Weight 04/06/2019 04/05/2019  03/04/2019 01/29/2019 01/01/2019 Q000111Q [id number]  Systolic BP 0000000 [id number] Q000111Q [id number] 0000000 123456 Q000111Q  Diastolic BP 70 76 75 74 65 78 65  Wt. (Lbs) 230 - 234.9 - - 227 -  BMI 37.12 - 37.91 - - 36.64 -       Morbid obesity Obesity linked with hypertension and diabetes  Patient re-educated about  the importance of commitment to a  minimum of 150 minutes of exercise per week as able.  The importance of healthy food choices with portion control discussed, as well as eating  regularly and within a 12 hour window most days. The need to choose "clean , green" food 50 to 75% of the time is discussed, as well as to make water the primary drink and set a goal of 64 ounces water daily.    Weight /BMI 04/06/2019 03/04/2019 12/02/2018  WEIGHT 230 lb 234 lb 14.4 oz 227 lb  HEIGHT 5\' 6"  - 5\' 6"   BMI 37.12 kg/m2 37.91 kg/m2 36.64 kg/m2      Hyperlipidemia LDL goal <100 Hyperlipidemia:Low fat diet discussed and encouraged.   Lipid Panel  Lab Results  Component Value Date   CHOL 174 03/30/2019   HDL 52 03/30/2019   LDLCALC 106 (H) 03/30/2019   TRIG 71 03/30/2019   CHOLHDL 3.3 03/30/2019  needs to reduce fat in diet      Follow Up Instructions:    I discussed the assessment and treatment plan with the patient. The patient was provided an opportunity to ask questions and all were answered. The patient agreed with the plan and demonstrated an understanding of the instructions.   The patient was advised to call back or seek an in-person evaluation if the symptoms worsen or if the condition fails to improve as anticipated.  I provided 20 minutes of non-face-to-face time during this encounter.   Helen Nakayama, MD

## 2019-04-10 ENCOUNTER — Encounter: Payer: Self-pay | Admitting: Family Medicine

## 2019-04-10 NOTE — Assessment & Plan Note (Signed)
Obesity linked with hypertension and diabetes  Patient re-educated about  the importance of commitment to a  minimum of 150 minutes of exercise per week as able.  The importance of healthy food choices with portion control discussed, as well as eating regularly and within a 12 hour window most days. The need to choose "clean , green" food 50 to 75% of the time is discussed, as well as to make water the primary drink and set a goal of 64 ounces water daily.    Weight /BMI 04/06/2019 03/04/2019 12/02/2018  WEIGHT 230 lb 234 lb 14.4 oz 227 lb  HEIGHT 5\' 6"  - 5\' 6"   BMI 37.12 kg/m2 37.91 kg/m2 36.64 kg/m2

## 2019-04-10 NOTE — Assessment & Plan Note (Signed)
Controlled, no change in medication DASH diet and commitment to daily physical activity for a minimum of 30 minutes discussed and encouraged, as a part of hypertension management. The importance of attaining a healthy weight is also discussed.  BP/Weight 04/06/2019 04/05/2019 03/04/2019 01/29/2019 01/01/2019 Q000111Q [id number]  Systolic BP 0000000 [id number] Q000111Q [id number] 0000000 123456 Q000111Q  Diastolic BP 70 76 75 74 65 78 65  Wt. (Lbs) 230 - 234.9 - - 227 -  BMI 37.12 - 37.91 - - 36.64 -

## 2019-04-10 NOTE — Assessment & Plan Note (Signed)
Improved and well controlled, decrease lantus dose Helen Mahoney is reminded of the importance of commitment to daily physical activity for 30 minutes or more, as able and the need to limit carbohydrate intake to 30 to 60 grams per meal to help with blood sugar control.   The need to take medication as prescribed, test blood sugar as directed, and to call between visits if there is a concern that blood sugar is uncontrolled is also discussed.   Helen Mahoney is reminded of the importance of daily foot exam, annual eye examination, and good blood sugar, blood pressure and cholesterol control.  Diabetic Labs Latest Ref Rng & Units 03/30/2019 03/01/2019 10/12/2018 08/27/2018 07/17/2018  HbA1c <5.7 % of total Hgb 6.5(H) - 6.6(H) - 6.8(H)  Microalbumin mg/dL 0.9 - - - -  Micro/Creat Ratio 0.0 - 30.0 mg/g creat - - - - -  Chol <200 mg/dL 174 - - - 143  HDL > OR = 50 mg/dL 52 - - - 50  Calc LDL mg/dL (calc) 106(H) - - - 79  Triglycerides <150 mg/dL 71 - - - 68  Creatinine 0.50 - 0.99 mg/dL 0.90 1.03(H) 0.79 0.92 0.97   BP/Weight 04/06/2019 04/05/2019 03/04/2019 01/29/2019 01/01/2019 Q000111Q [id number]  Systolic BP 0000000 [id number] Q000111Q [id number] 0000000 123456 Q000111Q  Diastolic BP 70 76 75 74 65 78 65  Wt. (Lbs) 230 - 234.9 - - 227 -  BMI 37.12 - 37.91 - - 36.64 -   Foot/eye exam completion dates Latest Ref Rng & Units 03/22/2019 05/25/2018  Eye Exam No Retinopathy No Retinopathy -  Foot exam Order - - -  Foot Form Completion - - Done  Updated lab needed at/ before next visit.

## 2019-04-10 NOTE — Assessment & Plan Note (Signed)
Hyperlipidemia:Low fat diet discussed and encouraged.   Lipid Panel  Lab Results  Component Value Date   CHOL 174 03/30/2019   HDL 52 03/30/2019   LDLCALC 106 (H) 03/30/2019   TRIG 71 03/30/2019   CHOLHDL 3.3 03/30/2019  needs to reduce fat in diet

## 2019-05-10 ENCOUNTER — Other Ambulatory Visit: Payer: Self-pay

## 2019-05-10 ENCOUNTER — Inpatient Hospital Stay (HOSPITAL_COMMUNITY): Payer: Medicare HMO | Attending: Internal Medicine

## 2019-05-10 VITALS — BP 138/71 | HR 98 | Temp 97.1°F | Resp 18

## 2019-05-10 DIAGNOSIS — Z79899 Other long term (current) drug therapy: Secondary | ICD-10-CM | POA: Insufficient documentation

## 2019-05-10 DIAGNOSIS — E538 Deficiency of other specified B group vitamins: Secondary | ICD-10-CM | POA: Diagnosis not present

## 2019-05-10 DIAGNOSIS — D509 Iron deficiency anemia, unspecified: Secondary | ICD-10-CM | POA: Diagnosis not present

## 2019-05-10 MED ORDER — CYANOCOBALAMIN 1000 MCG/ML IJ SOLN
1000.0000 ug | Freq: Once | INTRAMUSCULAR | Status: AC
  Administered 2019-05-10: 10:00:00 1000 ug via INTRAMUSCULAR
  Filled 2019-05-10: qty 1

## 2019-05-10 NOTE — Progress Notes (Signed)
Helen Mahoney presents today for B12 injection. VSS. Injection tolerated without incident or complaint. See MAR for details. Patient discharged in satisfactory condition with follow up instructions.

## 2019-05-10 NOTE — Patient Instructions (Signed)
[facility]  Discharge Instructions:  B12 injection received today. _______________________________________________________________  Thank you for choosing [facility] at [facility] to provide your oncology and hematology care.  To afford each patient quality time with our providers, please arrive at least 15 minutes before your scheduled appointment.  You need to re-schedule your appointment if you arrive 10 or more minutes late.  We strive to give you quality time with our providers, and arriving late affects you and other patients whose appointments are after yours.  Also, if you no show three or more times for appointments you may be dismissed from the clinic.  Again, thank you for choosing [facility]. Our hope is that these requests will allow you access to exceptional care and in a timely manner. _______________________________________________________________  If you have questions after your visit, please contact our office at [phone] between the hours of 8:30 a.m. and 5:00 p.m. Voicemails left after 4:30 p.m. will not be returned until the following business day. _______________________________________________________________  For prescription refill requests, have your pharmacy contact our office. _______________________________________________________________  Recommendations made by the consultant and any test results will be sent to your referring physician. _______________________________________________________________ 

## 2019-05-19 ENCOUNTER — Other Ambulatory Visit: Payer: Self-pay | Admitting: Family Medicine

## 2019-05-29 ENCOUNTER — Other Ambulatory Visit: Payer: Self-pay | Admitting: Family Medicine

## 2019-06-14 ENCOUNTER — Inpatient Hospital Stay (HOSPITAL_COMMUNITY): Payer: Medicare HMO | Attending: Internal Medicine

## 2019-06-14 ENCOUNTER — Other Ambulatory Visit: Payer: Self-pay

## 2019-06-14 VITALS — BP 131/79 | HR 88 | Temp 97.0°F | Resp 18

## 2019-06-14 DIAGNOSIS — Z8249 Family history of ischemic heart disease and other diseases of the circulatory system: Secondary | ICD-10-CM | POA: Diagnosis not present

## 2019-06-14 DIAGNOSIS — Z841 Family history of disorders of kidney and ureter: Secondary | ICD-10-CM | POA: Insufficient documentation

## 2019-06-14 DIAGNOSIS — D509 Iron deficiency anemia, unspecified: Secondary | ICD-10-CM | POA: Diagnosis not present

## 2019-06-14 DIAGNOSIS — E538 Deficiency of other specified B group vitamins: Secondary | ICD-10-CM | POA: Insufficient documentation

## 2019-06-14 DIAGNOSIS — Z79899 Other long term (current) drug therapy: Secondary | ICD-10-CM | POA: Insufficient documentation

## 2019-06-14 DIAGNOSIS — Z8379 Family history of other diseases of the digestive system: Secondary | ICD-10-CM | POA: Insufficient documentation

## 2019-06-14 DIAGNOSIS — Z823 Family history of stroke: Secondary | ICD-10-CM | POA: Diagnosis not present

## 2019-06-14 DIAGNOSIS — Z833 Family history of diabetes mellitus: Secondary | ICD-10-CM | POA: Insufficient documentation

## 2019-06-14 DIAGNOSIS — Z803 Family history of malignant neoplasm of breast: Secondary | ICD-10-CM | POA: Diagnosis not present

## 2019-06-14 DIAGNOSIS — Z818 Family history of other mental and behavioral disorders: Secondary | ICD-10-CM | POA: Diagnosis not present

## 2019-06-14 DIAGNOSIS — Z800 Family history of malignant neoplasm of digestive organs: Secondary | ICD-10-CM | POA: Insufficient documentation

## 2019-06-14 DIAGNOSIS — R5383 Other fatigue: Secondary | ICD-10-CM | POA: Diagnosis not present

## 2019-06-14 MED ORDER — CYANOCOBALAMIN 1000 MCG/ML IJ SOLN
1000.0000 ug | Freq: Once | INTRAMUSCULAR | Status: AC
  Administered 2019-06-14: 11:00:00 1000 ug via INTRAMUSCULAR

## 2019-06-14 MED ORDER — CYANOCOBALAMIN 1000 MCG/ML IJ SOLN
INTRAMUSCULAR | Status: AC
  Filled 2019-06-14: qty 1

## 2019-06-14 NOTE — Patient Instructions (Signed)
[facility] at [facility]  Discharge Instructions:   _______________________________________________________________  Thank you for choosing [facility] at [facility] to provide your oncology and hematology care.  To afford each patient quality time with our providers, please arrive at least 15 minutes before your scheduled appointment.  You need to re-schedule your appointment if you arrive 10 or more minutes late.  We strive to give you quality time with our providers, and arriving late affects you and other patients whose appointments are after yours.  Also, if you no show three or more times for appointments you may be dismissed from the clinic.  Again, thank you for choosing [facility] at [facility]. Our hope is that these requests will allow you access to exceptional care and in a timely manner. _______________________________________________________________  If you have questions after your visit, please contact our office at [phone] between the hours of 8:30 a.m. and 5:00 p.m. Voicemails left after 4:30 p.m. will not be returned until the following business day. _______________________________________________________________  For prescription refill requests, have your pharmacy contact our office. _______________________________________________________________  Recommendations made by the consultant and any test results will be sent to your referring physician. _______________________________________________________________ 

## 2019-06-14 NOTE — Progress Notes (Signed)
Helen Mahoney presents today for B12 injection. VSS. Injection tolerated without incident or complaint. See MAR for details. Patient discharged in satisfactory condition with follow up instructions.

## 2019-06-16 ENCOUNTER — Other Ambulatory Visit: Payer: Self-pay | Admitting: Family Medicine

## 2019-06-28 ENCOUNTER — Other Ambulatory Visit: Payer: Self-pay | Admitting: Family Medicine

## 2019-07-12 ENCOUNTER — Encounter (HOSPITAL_COMMUNITY): Payer: Self-pay

## 2019-07-12 ENCOUNTER — Inpatient Hospital Stay (HOSPITAL_COMMUNITY): Payer: Medicare HMO | Attending: Internal Medicine

## 2019-07-12 ENCOUNTER — Other Ambulatory Visit: Payer: Self-pay

## 2019-07-12 VITALS — BP 120/69 | HR 84 | Temp 96.7°F | Resp 18

## 2019-07-12 DIAGNOSIS — Z8249 Family history of ischemic heart disease and other diseases of the circulatory system: Secondary | ICD-10-CM | POA: Diagnosis not present

## 2019-07-12 DIAGNOSIS — Z818 Family history of other mental and behavioral disorders: Secondary | ICD-10-CM | POA: Diagnosis not present

## 2019-07-12 DIAGNOSIS — Z841 Family history of disorders of kidney and ureter: Secondary | ICD-10-CM | POA: Diagnosis not present

## 2019-07-12 DIAGNOSIS — Z833 Family history of diabetes mellitus: Secondary | ICD-10-CM | POA: Diagnosis not present

## 2019-07-12 DIAGNOSIS — D509 Iron deficiency anemia, unspecified: Secondary | ICD-10-CM | POA: Diagnosis not present

## 2019-07-12 DIAGNOSIS — E119 Type 2 diabetes mellitus without complications: Secondary | ICD-10-CM | POA: Diagnosis not present

## 2019-07-12 DIAGNOSIS — Z79899 Other long term (current) drug therapy: Secondary | ICD-10-CM | POA: Diagnosis not present

## 2019-07-12 DIAGNOSIS — E785 Hyperlipidemia, unspecified: Secondary | ICD-10-CM | POA: Insufficient documentation

## 2019-07-12 DIAGNOSIS — Z823 Family history of stroke: Secondary | ICD-10-CM | POA: Diagnosis not present

## 2019-07-12 DIAGNOSIS — E538 Deficiency of other specified B group vitamins: Secondary | ICD-10-CM | POA: Insufficient documentation

## 2019-07-12 DIAGNOSIS — D563 Thalassemia minor: Secondary | ICD-10-CM | POA: Insufficient documentation

## 2019-07-12 DIAGNOSIS — Z800 Family history of malignant neoplasm of digestive organs: Secondary | ICD-10-CM | POA: Diagnosis not present

## 2019-07-12 MED ORDER — CYANOCOBALAMIN 1000 MCG/ML IJ SOLN
1000.0000 ug | Freq: Once | INTRAMUSCULAR | Status: AC
  Administered 2019-07-12: 1000 ug via INTRAMUSCULAR
  Filled 2019-07-12: qty 1

## 2019-07-12 NOTE — Progress Notes (Signed)
Patient tolerated injection with no complaints voiced.  Site clean and dry with no bruising or swelling noted at site.  Band aid applied.  Vss with discharge and left ambulatory with no s/s of distress noted.  

## 2019-07-14 ENCOUNTER — Other Ambulatory Visit: Payer: Self-pay | Admitting: Family Medicine

## 2019-08-02 ENCOUNTER — Encounter: Payer: Medicare HMO | Admitting: Family Medicine

## 2019-08-13 ENCOUNTER — Other Ambulatory Visit: Payer: Self-pay | Admitting: Family Medicine

## 2019-08-16 ENCOUNTER — Other Ambulatory Visit: Payer: Self-pay

## 2019-08-16 ENCOUNTER — Inpatient Hospital Stay (HOSPITAL_COMMUNITY): Payer: Medicare HMO | Attending: Internal Medicine

## 2019-08-16 ENCOUNTER — Encounter (HOSPITAL_COMMUNITY): Payer: Self-pay

## 2019-08-16 VITALS — BP 125/74 | HR 80 | Temp 97.7°F | Resp 18

## 2019-08-16 DIAGNOSIS — E785 Hyperlipidemia, unspecified: Secondary | ICD-10-CM | POA: Insufficient documentation

## 2019-08-16 DIAGNOSIS — D563 Thalassemia minor: Secondary | ICD-10-CM | POA: Insufficient documentation

## 2019-08-16 DIAGNOSIS — Z823 Family history of stroke: Secondary | ICD-10-CM | POA: Diagnosis not present

## 2019-08-16 DIAGNOSIS — E538 Deficiency of other specified B group vitamins: Secondary | ICD-10-CM

## 2019-08-16 DIAGNOSIS — Z79899 Other long term (current) drug therapy: Secondary | ICD-10-CM | POA: Diagnosis not present

## 2019-08-16 DIAGNOSIS — Z800 Family history of malignant neoplasm of digestive organs: Secondary | ICD-10-CM | POA: Insufficient documentation

## 2019-08-16 DIAGNOSIS — Z8249 Family history of ischemic heart disease and other diseases of the circulatory system: Secondary | ICD-10-CM | POA: Insufficient documentation

## 2019-08-16 DIAGNOSIS — I10 Essential (primary) hypertension: Secondary | ICD-10-CM | POA: Diagnosis not present

## 2019-08-16 DIAGNOSIS — Z803 Family history of malignant neoplasm of breast: Secondary | ICD-10-CM | POA: Diagnosis not present

## 2019-08-16 DIAGNOSIS — D509 Iron deficiency anemia, unspecified: Secondary | ICD-10-CM | POA: Insufficient documentation

## 2019-08-16 DIAGNOSIS — Z841 Family history of disorders of kidney and ureter: Secondary | ICD-10-CM | POA: Diagnosis not present

## 2019-08-16 DIAGNOSIS — Z818 Family history of other mental and behavioral disorders: Secondary | ICD-10-CM | POA: Diagnosis not present

## 2019-08-16 DIAGNOSIS — Z833 Family history of diabetes mellitus: Secondary | ICD-10-CM | POA: Diagnosis not present

## 2019-08-16 MED ORDER — CYANOCOBALAMIN 1000 MCG/ML IJ SOLN
INTRAMUSCULAR | Status: AC
  Filled 2019-08-16: qty 1

## 2019-08-16 MED ORDER — CYANOCOBALAMIN 1000 MCG/ML IJ SOLN
1000.0000 ug | Freq: Once | INTRAMUSCULAR | Status: AC
  Administered 2019-08-16: 1000 ug via INTRAMUSCULAR

## 2019-08-16 NOTE — Progress Notes (Signed)
Patient tolerated B12 injection with no complaints voiced.  Site clean and dry with no bruising or swelling noted at site.  Band aid applied.  Vss with discharge and left ambulatory with no s/s of distress noted.

## 2019-08-20 ENCOUNTER — Other Ambulatory Visit: Payer: Self-pay | Admitting: *Deleted

## 2019-09-13 ENCOUNTER — Other Ambulatory Visit: Payer: Self-pay

## 2019-09-13 ENCOUNTER — Inpatient Hospital Stay (HOSPITAL_COMMUNITY): Payer: Medicare HMO | Attending: Hematology

## 2019-09-13 DIAGNOSIS — Z818 Family history of other mental and behavioral disorders: Secondary | ICD-10-CM | POA: Insufficient documentation

## 2019-09-13 DIAGNOSIS — D509 Iron deficiency anemia, unspecified: Secondary | ICD-10-CM | POA: Insufficient documentation

## 2019-09-13 DIAGNOSIS — Z794 Long term (current) use of insulin: Secondary | ICD-10-CM | POA: Insufficient documentation

## 2019-09-13 DIAGNOSIS — Z8249 Family history of ischemic heart disease and other diseases of the circulatory system: Secondary | ICD-10-CM | POA: Diagnosis not present

## 2019-09-13 DIAGNOSIS — R718 Other abnormality of red blood cells: Secondary | ICD-10-CM | POA: Diagnosis not present

## 2019-09-13 DIAGNOSIS — Z86010 Personal history of colonic polyps: Secondary | ICD-10-CM | POA: Insufficient documentation

## 2019-09-13 DIAGNOSIS — D563 Thalassemia minor: Secondary | ICD-10-CM | POA: Diagnosis not present

## 2019-09-13 DIAGNOSIS — Z800 Family history of malignant neoplasm of digestive organs: Secondary | ICD-10-CM | POA: Diagnosis not present

## 2019-09-13 DIAGNOSIS — Z8379 Family history of other diseases of the digestive system: Secondary | ICD-10-CM | POA: Diagnosis not present

## 2019-09-13 DIAGNOSIS — Z841 Family history of disorders of kidney and ureter: Secondary | ICD-10-CM | POA: Insufficient documentation

## 2019-09-13 DIAGNOSIS — Z79899 Other long term (current) drug therapy: Secondary | ICD-10-CM | POA: Diagnosis not present

## 2019-09-13 DIAGNOSIS — Z833 Family history of diabetes mellitus: Secondary | ICD-10-CM | POA: Insufficient documentation

## 2019-09-13 DIAGNOSIS — Z803 Family history of malignant neoplasm of breast: Secondary | ICD-10-CM | POA: Insufficient documentation

## 2019-09-13 DIAGNOSIS — E119 Type 2 diabetes mellitus without complications: Secondary | ICD-10-CM | POA: Diagnosis not present

## 2019-09-13 DIAGNOSIS — Z880 Allergy status to penicillin: Secondary | ICD-10-CM | POA: Insufficient documentation

## 2019-09-13 DIAGNOSIS — Z823 Family history of stroke: Secondary | ICD-10-CM | POA: Insufficient documentation

## 2019-09-13 DIAGNOSIS — D508 Other iron deficiency anemias: Secondary | ICD-10-CM

## 2019-09-13 DIAGNOSIS — E538 Deficiency of other specified B group vitamins: Secondary | ICD-10-CM | POA: Diagnosis not present

## 2019-09-13 DIAGNOSIS — Z8719 Personal history of other diseases of the digestive system: Secondary | ICD-10-CM | POA: Insufficient documentation

## 2019-09-13 LAB — CBC WITH DIFFERENTIAL/PLATELET
Abs Immature Granulocytes: 0.09 10*3/uL — ABNORMAL HIGH (ref 0.00–0.07)
Basophils Absolute: 0.1 10*3/uL (ref 0.0–0.1)
Basophils Relative: 1 %
Eosinophils Absolute: 0.4 10*3/uL (ref 0.0–0.5)
Eosinophils Relative: 5 %
HCT: 37 % (ref 36.0–46.0)
Hemoglobin: 10.7 g/dL — ABNORMAL LOW (ref 12.0–15.0)
Immature Granulocytes: 1 %
Lymphocytes Relative: 24 %
Lymphs Abs: 2.1 10*3/uL (ref 0.7–4.0)
MCH: 21 pg — ABNORMAL LOW (ref 26.0–34.0)
MCHC: 28.9 g/dL — ABNORMAL LOW (ref 30.0–36.0)
MCV: 72.7 fL — ABNORMAL LOW (ref 80.0–100.0)
Monocytes Absolute: 0.5 10*3/uL (ref 0.1–1.0)
Monocytes Relative: 6 %
Neutro Abs: 5.6 10*3/uL (ref 1.7–7.7)
Neutrophils Relative %: 63 %
Platelets: 344 10*3/uL (ref 150–400)
RBC: 5.09 MIL/uL (ref 3.87–5.11)
RDW: 16.4 % — ABNORMAL HIGH (ref 11.5–15.5)
WBC: 8.9 10*3/uL (ref 4.0–10.5)
nRBC: 0 % (ref 0.0–0.2)

## 2019-09-13 LAB — COMPREHENSIVE METABOLIC PANEL
ALT: 21 U/L (ref 0–44)
AST: 18 U/L (ref 15–41)
Albumin: 3.4 g/dL — ABNORMAL LOW (ref 3.5–5.0)
Alkaline Phosphatase: 120 U/L (ref 38–126)
Anion gap: 10 (ref 5–15)
BUN: 18 mg/dL (ref 8–23)
CO2: 23 mmol/L (ref 22–32)
Calcium: 9.1 mg/dL (ref 8.9–10.3)
Chloride: 103 mmol/L (ref 98–111)
Creatinine, Ser: 0.97 mg/dL (ref 0.44–1.00)
GFR calc Af Amer: >60 mL/min (ref >60)
GFR calc non Af Amer: 60 mL/min — ABNORMAL LOW (ref >60)
Glucose, Bld: 224 mg/dL — ABNORMAL HIGH (ref 70–99)
Potassium: 4.4 mmol/L (ref 3.5–5.1)
Sodium: 136 mmol/L (ref 135–145)
Total Bilirubin: 0.5 mg/dL (ref 0.3–1.2)
Total Protein: 7.1 g/dL (ref 6.5–8.1)

## 2019-09-13 LAB — IRON AND TIBC
Iron: 53 ug/dL (ref 28–170)
Saturation Ratios: 18 % (ref 10.4–31.8)
TIBC: 299 ug/dL (ref 250–450)
UIBC: 246 ug/dL

## 2019-09-13 LAB — LACTATE DEHYDROGENASE: LDH: 127 U/L (ref 98–192)

## 2019-09-13 LAB — VITAMIN D 25 HYDROXY (VIT D DEFICIENCY, FRACTURES): Vit D, 25-Hydroxy: 45.77 ng/mL (ref 30–100)

## 2019-09-13 LAB — VITAMIN B12: Vitamin B-12: 826 pg/mL (ref 180–914)

## 2019-09-13 LAB — FERRITIN: Ferritin: 94 ng/mL (ref 11–307)

## 2019-09-20 ENCOUNTER — Ambulatory Visit (HOSPITAL_COMMUNITY): Payer: Medicare HMO

## 2019-09-20 ENCOUNTER — Ambulatory Visit (HOSPITAL_COMMUNITY): Payer: Medicare HMO | Admitting: Nurse Practitioner

## 2019-09-21 ENCOUNTER — Inpatient Hospital Stay (HOSPITAL_COMMUNITY): Payer: Medicare HMO

## 2019-09-21 ENCOUNTER — Ambulatory Visit (HOSPITAL_COMMUNITY): Payer: Medicare HMO | Admitting: Nurse Practitioner

## 2019-09-21 ENCOUNTER — Ambulatory Visit (HOSPITAL_COMMUNITY): Payer: Medicare HMO

## 2019-09-21 ENCOUNTER — Other Ambulatory Visit: Payer: Self-pay

## 2019-09-21 ENCOUNTER — Inpatient Hospital Stay (HOSPITAL_COMMUNITY): Payer: Medicare HMO | Admitting: Nurse Practitioner

## 2019-09-21 DIAGNOSIS — R718 Other abnormality of red blood cells: Secondary | ICD-10-CM | POA: Diagnosis not present

## 2019-09-21 DIAGNOSIS — Z794 Long term (current) use of insulin: Secondary | ICD-10-CM | POA: Diagnosis not present

## 2019-09-21 DIAGNOSIS — Z8719 Personal history of other diseases of the digestive system: Secondary | ICD-10-CM | POA: Diagnosis not present

## 2019-09-21 DIAGNOSIS — E538 Deficiency of other specified B group vitamins: Secondary | ICD-10-CM | POA: Diagnosis not present

## 2019-09-21 DIAGNOSIS — D563 Thalassemia minor: Secondary | ICD-10-CM | POA: Diagnosis not present

## 2019-09-21 DIAGNOSIS — D509 Iron deficiency anemia, unspecified: Secondary | ICD-10-CM | POA: Diagnosis not present

## 2019-09-21 DIAGNOSIS — Z79899 Other long term (current) drug therapy: Secondary | ICD-10-CM | POA: Diagnosis not present

## 2019-09-21 DIAGNOSIS — D508 Other iron deficiency anemias: Secondary | ICD-10-CM | POA: Diagnosis not present

## 2019-09-21 DIAGNOSIS — Z86010 Personal history of colonic polyps: Secondary | ICD-10-CM | POA: Diagnosis not present

## 2019-09-21 DIAGNOSIS — E119 Type 2 diabetes mellitus without complications: Secondary | ICD-10-CM | POA: Diagnosis not present

## 2019-09-21 MED ORDER — CYANOCOBALAMIN 1000 MCG/ML IJ SOLN
1000.0000 ug | Freq: Once | INTRAMUSCULAR | Status: AC
  Administered 2019-09-21: 1000 ug via INTRAMUSCULAR

## 2019-09-21 NOTE — Assessment & Plan Note (Addendum)
1.  Microcytic anemia: - This is been longstanding dating back to 2010. - She last had Feraheme 08/04/2015. - She takes oral iron daily without any side effects. - Labs on 09/13/2019 showed her hemoglobin at 10.7, ferritin at 95, percent saturation 18 and platelets at 344. -Her hemoglobin is stable between 10 and 11. -She does not wish to receive any IV iron at this time she wishes to keep taking her iron pills. - We will see her back in 6 months with repeat labs.  2.  Vitamin B12 deficiency: - Patient has been getting monthly B12 injections since February 2017. - Labs on 09/13/2019 showed her vitamin B12 level at 826 -She will receive her injection today. -We will continue to monitor.  3.  Alpha thalassemia trait: - She had alpha thalassemia genotype study done on 08/17/2015 -Is also an explanation of her persistent microcytosis. -Labs on 09/13/2019 showed her MCV at 72.7  4.  Health maintenance: -Patient's last mammogram was done on 02/22/2019 It showed B RADS category 1 negative.  She will have a repeat mammogram in October of 2021 - Patient's last colonoscopy was in 05/15/2015 it showed redundant L colon, diverticulosis, small internal hemorrhoids. -Patient's EGD was in 05/15/2015 which showed gastric polyps, GE junction stricture. - Patient had a capsule study 06/02/2015 which showed occasional erosion in the duodenum.  No masses, ulcers or AVMs seen.  Occasional lymphangectasia.

## 2019-09-21 NOTE — Patient Instructions (Signed)
[facility] at [facility] Discharge Instructions  Received Vit B12 injection today. Follow-up as scheduled. Call clinic for any questions or concerns   Thank you for choosing  [facility] to provide your oncology and hematology care.  To afford each patient quality time with our provider, please arrive at least 15 minutes before your scheduled appointment time.   If you have a lab appointment with the Cancer Center please come in thru the Main Entrance and check in at the main information desk.  You need to re-schedule your appointment should you arrive 10 or more minutes late.  We strive to give you quality time with our providers, and arriving late affects you and other patients whose appointments are after yours.  Also, if you no show three or more times for appointments you may be dismissed from the clinic at the providers discretion.     Again, thank you for choosing [facility].  Our hope is that these requests will decrease the amount of time that you wait before being seen by our physicians.       _____________________________________________________________  Should you have questions after your visit to [facility], please contact our office at [phone] between the hours of 8:00 a.m. and 4:30 p.m.  Voicemails left after 4:00 p.m. will not be returned until the following business day.  For prescription refill requests, have your pharmacy contact our office and allow 72 hours.    Due to Covid, you will need to wear a mask upon entering the hospital. If you do not have a mask, a mask will be given to you at the Main Entrance upon arrival. For doctor visits, patients may have 1 support person with them. For treatment visits, patients can not have anyone with them due to social distancing guidelines and our immunocompromised population.     

## 2019-09-21 NOTE — Progress Notes (Signed)
[name] tolerated Vit B12 injection well without complaints or incident. Pt discharged self ambulatory in satisfactory condition

## 2019-09-21 NOTE — Progress Notes (Signed)
Helen Mahoney, [place] [address]   CLINIC:  Medical Oncology/Hematology  PCP:  Fayrene Helper, MD [address] [place] [address] [phone]   REASON FOR VISIT: Follow-up for microcytic anemia  CURRENT THERAPY: Oral iron therapy   INTERVAL HISTORY:  Helen Mahoney 69 y.o. female returns for routine follow-up for microcytic anemia.  Patient reports she is taking her iron tablets as prescribed.  She reports they do not cause any constipation diarrhea or abdominal pains.  She denies any bright red bleeding per rectum or melena. Denies any nausea, vomiting, or diarrhea. Denies any new pains. Had not noticed any recent bleeding such as epistaxis, hematuria or hematochezia. Denies recent chest pain on exertion, shortness of breath on minimal exertion, pre-syncopal episodes, or palpitations. Denies any numbness or tingling in hands or feet. Denies any recent fevers, infections, or recent hospitalizations. Patient reports appetite at 100% and energy level at 75%.  She is eating well maintain her weight at this time.    REVIEW OF SYSTEMS:  Review of Systems  All other systems reviewed and are negative.    PAST MEDICAL/SURGICAL HISTORY:  Past Medical History:  Diagnosis Date  . 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 B12 deficiency 06/16/2015  . Colon polyps   . Diabetes mellitus, type 2 ([place])   . Hyperlipidemia   . Hypertension   . Microcytic anemia 05/18/2015   2013: TCS/EGD 2017: TCS/EGD HYPERPLASTIC GASTRIC POLYPS, LYMPHOCYTIC GASTRITIS   . Obesity    Past Surgical History:  Procedure Laterality Date  . bilateral tubal ligation  1979  . CHOLECYSTECTOMY  2001   ACUTE CHOLECYSTITIS/GALLSTONES  . COLONOSCOPY  05/14/2011   Dr. Oneida Alar: sessile polyp in sigmoid colon, internal hemorrhoids, hyerplastic polyps  . COLONOSCOPY N/A 05/15/2015   Procedure: COLONOSCOPY;  Surgeon: Danie Binder, MD;  [facility];  Service: Endoscopy;  Laterality: N/A;  0830  . COLONOSCOPY W/ POLYPECTOMY  NOV 2008 [facility] ANEMIA   POLYP NO RETRIEVED  . COLONOSCOPY W/ POLYPECTOMY  2006 DR. SMTH   POLYP?  . ESOPHAGOGASTRODUODENOSCOPY  05/14/2011   Dr. Oneida Alar: sessile polyps in the cardia, mild gastritis. Chronic duodenitis consistent with peptic duodenitis, chronic active H.pylori gastritis.   Marland Kitchen ESOPHAGOGASTRODUODENOSCOPY N/A 05/15/2015   Procedure: ESOPHAGOGASTRODUODENOSCOPY (EGD);  Surgeon: Danie Binder, MD;  Location: [facility];  Service: Endoscopy;  Laterality: N/A;  . GIVENS CAPSULE STUDY N/A 06/02/2015   Procedure: GIVENS CAPSULE STUDY;  Surgeon: Danie Binder, MD;  Location: [facility];  Service: Endoscopy;  Laterality: N/A;  0700  . TUBAL LIGATION    . UPPER GASTROINTESTINAL ENDOSCOPY  NOV 2008 [facility] ANEMIA   NL EXAM, urease neg     SOCIAL HISTORY:  Social History   Socioeconomic History  . Marital status: Married    Spouse name: Not on file  . Number of children: Not on file  . Years of education: Not on file  . Highest education level: Not on file  Occupational History  . Not on file  Tobacco Use  . Smoking status: Never Smoker  . Smokeless tobacco: Never Used  Substance and Sexual Activity  . Alcohol use: No  . Drug use: No  . Sexual activity: Not Currently  Other Topics Concern  . Not on file  Social History Narrative  . Not on file   Social Determinants of Health  Financial Resource Strain:   . Difficulty of Paying Living Expenses:   Food Insecurity:   . Worried About Charity fundraiser in the Last Year:   . Arboriculturist in the Last Year:   Transportation Needs:   . Film/video editor (Medical):   Marland Kitchen Lack of Transportation (Non-Medical):   Physical Activity:   . Days of Exercise per Week:   . Minutes of Exercise per Session:   Stress:   . Feeling of Stress :   Social Connections:   .  Frequency of Communication with Friends and Family:   . Frequency of Social Gatherings with Friends and Family:   . Attends Religious Services:   . Active Member of Clubs or Organizations:   . Attends Archivist Meetings:   Marland Kitchen Marital Status:   Intimate Partner Violence:   . Fear of Current or Ex-Partner:   . Emotionally Abused:   Marland Kitchen Physically Abused:   . Sexually Abused:     FAMILY HISTORY:  Family History  Problem Relation Age of Onset  . Heart disease Mother   . Diabetes Mother   . Hypertension Mother   . Stroke Mother   . Breast cancer Mother   . Diabetes Father   . Heart attack Father   . Kidney disease Father   . Hypertension Sister   . Diabetes Sister   . Diabetes Sister   . Mental illness Sister   . Hypertension Sister   . Hepatitis C Sister   . Mental illness Sister   . Diabetes Sister   . Hypertension Sister   . Mental illness Sister   . Colon cancer Sister 58  . Diabetes Brother     CURRENT MEDICATIONS:  Outpatient Encounter Medications as of 09/21/2019  Medication Sig  . aspirin 81 MG tablet Take 81 mg by mouth daily.  . Calcium Citrate-Vitamin D (CITRACAL PETITES/VITAMIN D) 200-250 MG-UNIT TABS Take 1 tablet by mouth daily.   . Cholecalciferol (VITAMIN D3) 2000 units TABS Take 1 tablet by mouth daily.  . Ferrous Sulfate (IRON) 325 (65 FE) MG TABS Take 1 tablet by mouth daily.  Marland Kitchen glipiZIDE (GLUCOTROL) 10 MG tablet TAKE 1 TABLET TWICE DAILY BEFORE MEALS  . Insulin Pen Needle (RELION PEN NEEDLE 31G/8MM) 31G X 8 MM MISC USE 1  ONCE DAILY AS DIRECTED dx e11.65  . LANTUS SOLOSTAR 100 UNIT/ML Solostar Pen INJECT 50 UNITS SUBCUTANEOUSLY ONCE DAILY AT  10PM (Patient taking differently: Inject 38 Units into the skin daily. INJECT 50 UNITS SUBCUTANEOUSLY ONCE DAILY AT  10PM)  . lisinopril (ZESTRIL) 10 MG tablet TAKE 1 TABLET EVERY DAY  . metFORMIN (GLUCOPHAGE) 1000 MG tablet TAKE 1 TABLET TWICE DAILY WITH MEALS  . Multiple Vitamin (MULTIVITAMINS PO) Take 1  tablet by mouth daily.   . pravastatin (PRAVACHOL) 80 MG tablet TAKE 1 TABLET ONE TIME DAILY IN THE EVENING  . TRUE METRIX BLOOD GLUCOSE TEST test strip TEST THREE TIMES DAILY   No facility-administered encounter medications on file as of 09/21/2019.    ALLERGIES:  Tetanus toxoid's and penicillin  PHYSICAL EXAM:  ECOG Performance status: 1  Vitals:   09/21/19 1035  BP: 139/70  Pulse: 95  Resp: 18  Temp: (!) 96.4 F (35.8 C)  SpO2: 98%   Filed Weights   09/21/19 1035  Weight: 241 lb 9.6 oz (109.6 kg)      Physical Exam Constitutional:      Appearance: Normal appearance. She is normal weight.  Cardiovascular:     Rate and Rhythm: Normal rate and regular rhythm.     Heart sounds: Normal heart sounds.  Pulmonary:     Effort: Pulmonary effort is normal.     Breath sounds: Normal breath sounds.  Abdominal:     General: Bowel sounds are normal.     Palpations: Abdomen is soft.  Musculoskeletal:        General: Normal range of motion.  Skin:    General: Skin is warm.  Neurological:     Mental Status: She is alert and oriented to person, place, and time. Mental status is at baseline.  Psychiatric:        Mood and Affect: Mood normal.        Behavior: Behavior normal.        Thought Content: Thought content normal.        Judgment: Judgment normal.      LABORATORY DATA:  I have reviewed the labs as listed.  CBC    Component Value Date/Time   WBC 8.9 09/13/2019 1028   RBC 5.09 09/13/2019 1028   HGB 10.7 (L) 09/13/2019 1028   HCT 37.0 09/13/2019 1028   PLT 344 09/13/2019 1028   MCV 72.7 (L) 09/13/2019 1028   MCH 21.0 (L) 09/13/2019 1028   MCHC 28.9 (L) 09/13/2019 1028   RDW 16.4 (H) 09/13/2019 1028   LYMPHSABS 2.1 09/13/2019 1028   MONOABS 0.5 09/13/2019 1028   EOSABS 0.4 09/13/2019 1028   BASOSABS 0.1 09/13/2019 1028   CMP Latest Ref Rng & Units 09/13/2019 03/30/2019 03/01/2019  Glucose 70 - 99 mg/dL 224(H) 53(L) 251(H)  BUN 8 - 23 mg/dL 18 10 12    Creatinine 0.44 - 1.00 mg/dL 0.97 0.90 1.03(H)  Sodium 135 - 145 mmol/L 136 140 139  Potassium 3.5 - 5.1 mmol/L 4.4 3.9 3.9  Chloride 98 - 111 mmol/L 103 103 102  CO2 22 - 32 mmol/L 23 29 23   Calcium 8.9 - 10.3 mg/dL 9.1 9.1 8.9  Total Protein 6.5 - 8.1 g/dL 7.1 6.5 7.2  Total Bilirubin 0.3 - 1.2 mg/dL 0.5 0.3 0.4  Alkaline Phos 38 - 126 U/L 120 - 112  AST 15 - 41 U/L 18 15 21   ALT 0 - 44 U/L 21 14 20     All questions were answered to patient's stated satisfaction. Encouraged patient to call with any new concerns or questions before his next visit to the cancer center and we can certain see him sooner, if needed.     ASSESSMENT & PLAN:  IDA (iron deficiency anemia) 1.  Microcytic anemia: - This is been longstanding dating back to 2010. - She last had Feraheme 08/04/2015. - She takes oral iron daily without any side effects. - Labs on 09/13/2019 showed her hemoglobin at 10.7, ferritin at 95, percent saturation 18 and platelets at 344. -Her hemoglobin is stable between 10 and 11. -She does not wish to receive any IV iron at this time she wishes to keep taking her iron pills. - We will see her back in 6 months with repeat labs.  2.  Vitamin B12 deficiency: - Patient has been getting monthly B12 injections since February 2017. - Labs on 09/13/2019 showed her vitamin B12 level at 826 -She will receive her injection today. -We will continue to monitor.  3.  Alpha thalassemia trait: - She had alpha thalassemia genotype study done on 08/17/2015 -Is also an explanation of her persistent microcytosis. -Labs on 09/13/2019 showed her MCV at 72.7  4.  Health maintenance: -Patient's last mammogram was done on 02/22/2019 It showed B RADS category 1 negative.  She will have a repeat mammogram in October of 2021 - Patient's last colonoscopy was in 05/15/2015 it showed redundant L colon, diverticulosis, small internal hemorrhoids. -Patient's EGD was in 05/15/2015 which showed gastric polyps, GE  junction stricture. - Patient had a capsule study 06/02/2015 which showed occasional erosion in the duodenum.  No masses, ulcers or AVMs seen.  Occasional lymphangectasia.     Orders placed this encounter:  Orders Placed This Encounter  Procedures  . Lactate dehydrogenase  . CBC with Differential/Platelet  . Comprehensive metabolic panel  . Ferritin  . Iron and TIBC  . Vitamin B12  . VITAMIN D 25 Hydroxy (Vit-D Deficiency, Fractures)  . Folate      Francene Finders, FNP-C [place] [phone]

## 2019-09-21 NOTE — Patient Instructions (Signed)
[facility] at [facility] Discharge Instructions  Follow up in 6 months with labs    Thank you for choosing [facility] at [facility] to provide your oncology and hematology care.  To afford each patient quality time with our provider, please arrive at least 15 minutes before your scheduled appointment time.   If you have a lab appointment with the Cancer Center please come in thru the Main Entrance and check in at the main information desk.  You need to re-schedule your appointment should you arrive 10 or more minutes late.  We strive to give you quality time with our providers, and arriving late affects you and other patients whose appointments are after yours.  Also, if you no show three or more times for appointments you may be dismissed from the clinic at the providers discretion.     Again, thank you for choosing [facility].  Our hope is that these requests will decrease the amount of time that you wait before being seen by our physicians.       _____________________________________________________________  Should you have questions after your visit to [facility], please contact our office at [phone] between the hours of 8:00 a.m. and 4:30 p.m.  Voicemails left after 4:00 p.m. will not be returned until the following business day.  For prescription refill requests, have your pharmacy contact our office and allow 72 hours.    Due to Covid, you will need to wear a mask upon entering the hospital. If you do not have a mask, a mask will be given to you at the Main Entrance upon arrival. For doctor visits, patients may have 1 support person with them. For treatment visits, patients can not have anyone with them due to social distancing guidelines and our immunocompromised population.      

## 2019-10-22 ENCOUNTER — Other Ambulatory Visit: Payer: Self-pay

## 2019-10-22 ENCOUNTER — Encounter (HOSPITAL_COMMUNITY): Payer: Self-pay

## 2019-10-22 ENCOUNTER — Inpatient Hospital Stay (HOSPITAL_COMMUNITY): Payer: Medicare HMO | Attending: Internal Medicine

## 2019-10-22 VITALS — BP 147/81 | HR 107 | Temp 98.3°F | Resp 18

## 2019-10-22 DIAGNOSIS — Z79899 Other long term (current) drug therapy: Secondary | ICD-10-CM | POA: Diagnosis not present

## 2019-10-22 DIAGNOSIS — D509 Iron deficiency anemia, unspecified: Secondary | ICD-10-CM | POA: Insufficient documentation

## 2019-10-22 DIAGNOSIS — E538 Deficiency of other specified B group vitamins: Secondary | ICD-10-CM | POA: Insufficient documentation

## 2019-10-22 DIAGNOSIS — D563 Thalassemia minor: Secondary | ICD-10-CM | POA: Insufficient documentation

## 2019-10-22 MED ORDER — CYANOCOBALAMIN 1000 MCG/ML IJ SOLN
1000.0000 ug | Freq: Once | INTRAMUSCULAR | Status: AC
  Administered 2019-10-22: 1000 ug via INTRAMUSCULAR
  Filled 2019-10-22: qty 1

## 2019-10-22 NOTE — Progress Notes (Signed)
Helen Mahoney presents today for injection per the provider's orders.  b12 administration without incident; injection site WNL; see MAR for injection details.  Patient tolerated procedure well and without incident.  No questions or complaints noted at this time.pt d/c clinic ambulatory

## 2019-11-03 ENCOUNTER — Other Ambulatory Visit: Payer: Self-pay | Admitting: Family Medicine

## 2019-11-05 ENCOUNTER — Other Ambulatory Visit: Payer: Self-pay | Admitting: *Deleted

## 2019-11-22 ENCOUNTER — Encounter (HOSPITAL_COMMUNITY): Payer: Self-pay

## 2019-11-22 ENCOUNTER — Inpatient Hospital Stay (HOSPITAL_COMMUNITY): Payer: Medicare HMO | Attending: Internal Medicine

## 2019-11-22 ENCOUNTER — Other Ambulatory Visit: Payer: Self-pay

## 2019-11-22 VITALS — BP 147/76 | HR 83 | Temp 97.1°F | Resp 18

## 2019-11-22 DIAGNOSIS — D509 Iron deficiency anemia, unspecified: Secondary | ICD-10-CM | POA: Diagnosis not present

## 2019-11-22 DIAGNOSIS — E538 Deficiency of other specified B group vitamins: Secondary | ICD-10-CM | POA: Diagnosis not present

## 2019-11-22 MED ORDER — CYANOCOBALAMIN 1000 MCG/ML IJ SOLN
1000.0000 ug | Freq: Once | INTRAMUSCULAR | Status: AC
  Administered 2019-11-22: 1000 ug via INTRAMUSCULAR

## 2019-11-22 NOTE — Patient Instructions (Signed)
[facility] at [facility] Discharge Instructions  Received Vit B12 injection today. Follow-up as scheduled   Thank you for choosing [facility] at [facility] to provide your oncology and hematology care.  To afford each patient quality time with our provider, please arrive at least 15 minutes before your scheduled appointment time.   If you have a lab appointment with the Cancer Center please come in thru the Main Entrance and check in at the main information desk.  You need to re-schedule your appointment should you arrive 10 or more minutes late.  We strive to give you quality time with our providers, and arriving late affects you and other patients whose appointments are after yours.  Also, if you no show three or more times for appointments you may be dismissed from the clinic at the providers discretion.     Again, thank you for choosing [facility].  Our hope is that these requests will decrease the amount of time that you wait before being seen by our physicians.       _____________________________________________________________  Should you have questions after your visit to [facility], please contact our office at [phone] and follow the prompts.  Our office hours are 8:00 a.m. and 4:30 p.m. Monday - Friday.  Please note that voicemails left after 4:00 p.m. may not be returned until the following business day.  We are closed weekends and major holidays.  You do have access to a nurse 24-7, just call the main number to the clinic [phone] and do not press any options, hold on the line and a nurse will answer the phone.    For prescription refill requests, have your pharmacy contact our office and allow 72 hours.    Due to Covid, you will need to wear a mask upon entering the hospital. If you do not have a mask, a mask will be given to you at the Main Entrance upon arrival. For doctor visits, patients may have  1 support person age 18 or older with them. For treatment visits, patients can not have anyone with them due to social distancing guidelines and our immunocompromised population.     

## 2019-11-22 NOTE — Progress Notes (Signed)
Helen Mahoney tolerated Vit B12 injection well without complaints or incident. VSS Pt discharged self ambulatory in satisfactory condition 

## 2019-12-03 ENCOUNTER — Telehealth: Payer: Medicare HMO | Admitting: Family Medicine

## 2019-12-03 ENCOUNTER — Other Ambulatory Visit: Payer: Self-pay | Admitting: Family Medicine

## 2019-12-16 ENCOUNTER — Other Ambulatory Visit: Payer: Self-pay

## 2019-12-16 ENCOUNTER — Ambulatory Visit (INDEPENDENT_AMBULATORY_CARE_PROVIDER_SITE_OTHER): Payer: Medicare HMO | Admitting: Family Medicine

## 2019-12-16 ENCOUNTER — Encounter: Payer: Self-pay | Admitting: Family Medicine

## 2019-12-16 VITALS — BP 122/77 | HR 87 | Resp 16 | Ht 69.0 in | Wt 238.0 lb

## 2019-12-16 DIAGNOSIS — E1159 Type 2 diabetes mellitus with other circulatory complications: Secondary | ICD-10-CM

## 2019-12-16 DIAGNOSIS — Z0000 Encounter for general adult medical examination without abnormal findings: Secondary | ICD-10-CM

## 2019-12-16 DIAGNOSIS — E785 Hyperlipidemia, unspecified: Secondary | ICD-10-CM | POA: Diagnosis not present

## 2019-12-16 DIAGNOSIS — I10 Essential (primary) hypertension: Secondary | ICD-10-CM

## 2019-12-16 DIAGNOSIS — Z23 Encounter for immunization: Secondary | ICD-10-CM | POA: Diagnosis not present

## 2019-12-16 NOTE — Progress Notes (Signed)
    Helen Mahoney     MRN: [id number]      DOB: January 15, 1951  HPI: Patient is in for annual physical exam. No other health concerns are expressed or addressed at the visit. Recent labs, if available are reviewed. Immunization is reviewed , and  updated if needed.   PE: Pleasant  female, alert and oriented x 3, in no cardio-pulmonary distress. Afebrile. HEENT No facial trauma or asymetry. Sinuses non tender.  Extra occullar muscles intact.. External ears normal, . Neck: supple, no adenopathy,JVD or thyromegaly.No bruits.  Chest: Clear to ascultation bilaterally.No crackles or wheezes. Non tender to palpation  Breast: No asymetry,no masses or lumps. No tenderness. No nipple discharge or inversion. No axillary or supraclavicular adenopathy  Cardiovascular system; Heart sounds normal,  S1 and  S2 ,no S3.  No murmur, or thrill. Apical beat not displaced Peripheral pulses normal.  Abdomen: Soft, non tender, no organomegaly or masses. No bruits. Bowel sounds normal. No guarding, tenderness or rebound.   GU: External genitalia normal female genitalia , normal female distribution of hair. No lesions. Urethral meatus normal in size, no  Prolapse, no lesions visibly  Present. Bladder non tender. Vagina pink and moist , with no visible lesions , discharge present . Adequate pelvic support no  cystocele or rectocele noted Cervix pink and appears healthy, no lesions or ulcerations noted, no discharge noted from os Uterus normal size, no adnexal masses, no cervical motion or adnexal tenderness.   Musculoskeletal exam: Full ROM of spine, hips , shoulders and knees. No deformity ,swelling or crepitus noted. No muscle wasting or atrophy.   Neurologic: Cranial nerves 2 to 12 intact. Power, tone ,sensation and reflexes normal throughout. No disturbance in gait. No tremor.  Skin: Intact, no ulceration, erythema , scaling or rash noted. Pigmentation normal  throughout  Psych; Normal mood and affect. Judgement and concentration normal   Assessment & Plan:  No problem-specific Assessment & Plan notes found for this encounter.

## 2019-12-16 NOTE — Progress Notes (Signed)
Helen Mahoney     MRN: [id number]      DOB: Jul 05, 1950  HPI: Patient is in for annual physical exam. No other health concerns are expressed or addressed at the visit. Recent labs, if available are reviewed. Immunization is reviewed , and  updated if needed.   PE: BP 122/77    Pulse 87    Resp 16    Ht 5\' 9"  (1.753 m)    Wt 238 lb (108 kg)    SpO2 98%    BMI 35.15 kg/m   Pleasant  female, alert and oriented x 3, in no cardio-pulmonary distress. Afebrile. HEENT No facial trauma or asymetry. Sinuses non tender.  Extra occullar muscles intact.. External ears normal, . Neck: supple, no adenopathy,JVD or thyromegaly.No bruits.  Chest: Clear to ascultation bilaterally.No crackles or wheezes. Non tender to palpation    Cardiovascular system; Heart sounds normal,  S1 and  S2 ,no S3.  No murmur, or thrill. Apical beat not displaced Peripheral pulses normal.  Abdomen: Soft, non tender, no organomegaly or masses. No bruits. Bowel sounds normal. No guarding, tenderness or rebound.   s   Musculoskeletal exam: Full ROM of spine, hips , shoulders and knees. No deformity ,swelling or crepitus noted. No muscle wasting or atrophy.   Neurologic: Cranial nerves 2 to 12 intact. Power, tone ,sensation and reflexes normal throughout. No disturbance in gait. No tremor.  Skin: Intact, no ulceration, erythema , scaling or rash noted. Pigmentation normal throughout  Psych; Normal mood and affect. Judgement and concentration normal   Assessment & Plan:  Annual physical exam Annual exam as documented. Counseling done  re healthy lifestyle involving commitment to 150 minutes exercise per week, heart healthy diet, and attaining healthy weight.The importance of adequate sleep also discussed. Regular seat belt use and home safety, is also discussed. Changes in health habits are decided on by the patient with goals and time frames  set for achieving them. Immunization and  cancer screening needs are specifically addressed at this visit.   Need for influenza vaccination After obtaining informed consent, the vaccine is  administered , with no adverse effect noted at the time of administration.   Type 2 diabetes mellitus with vascular disease (HCC) Deteriorated, inc lantus to 45 units daily Ms. Brisky is reminded of the importance of commitment to daily physical activity for 30 minutes or more, as able and the need to limit carbohydrate intake to 30 to 60 grams per meal to help with blood sugar control.   The need to take medication as prescribed, test blood sugar as directed, and to call between visits if there is a concern that blood sugar is uncontrolled is also discussed.   Ms. Gallaher is reminded of the importance of daily foot exam, annual eye examination, and good blood sugar, blood pressure and cholesterol control.  Diabetic Labs Latest Ref Rng & Units 09/13/2019 03/30/2019 03/01/2019 10/12/2018 08/27/2018  HbA1c <5.7 % of total Hgb - 6.5(H) - 6.6(H) -  Microalbumin mg/dL - 0.9 - - -  Micro/Creat Ratio 0.0 - 30.0 mg/g creat - - - - -  Chol <200 mg/dL - 174 - - -  HDL > OR = 50 mg/dL - 52 - - -  Calc LDL mg/dL (calc) - 106(H) - - -  Triglycerides <150 mg/dL - 71 - - -  Creatinine 0.44 - 1.00 mg/dL 0.97 0.90 1.03(H) 0.79 0.92   BP/Weight 12/16/2019 11/22/2019 10/22/2019 09/21/2019 08/16/2019 07/12/2019 7/41/6384  Systolic BP 536  147 147 139 011 003 496  Diastolic BP 77 76 81 70 74 69 79  Wt. (Lbs) 238 - - 241.6 - - -  BMI 35.15 - - 39 - - -   Foot/eye exam completion dates Latest Ref Rng & Units 12/16/2019 03/22/2019  Eye Exam No Retinopathy - No Retinopathy  Foot exam Order - - -  Foot Form Completion - Done -

## 2019-12-16 NOTE — Patient Instructions (Signed)
F./U early jan in office with mD, call if you need me before  Flu vaccine today  Please schedule October mammogram at checkout  Increase lantus to 45 units daily and reduce carb intake  It is important that you exercise regularly at least 30 minutes 5 times a week. If you develop chest pain, have severe difficulty breathing, or feel very tired, stop exercising immediately and seek medical attention   Lipid, cmp and eGFr and TSH today  Think about what you will eat, plan ahead. Choose " clean, green, fresh or frozen" over canned, processed or packaged foods which are more sugary, salty and fatty. 70 to 75% of food eaten should be vegetables and fruit. Three meals at set times with snacks allowed between meals, but they must be fruit or vegetables. Aim to eat over a 12 hour period , example 7 am to 7 pm, and STOP after  your last meal of the day. Drink water,generally about 64 ounces per day, no other drink is as healthy. Fruit juice is best enjoyed in a healthy way, by EATING the fruit. Thanks for choosing [facility], we consider it a privelige to serve you.

## 2019-12-16 NOTE — Assessment & Plan Note (Signed)

## 2019-12-16 NOTE — Assessment & Plan Note (Signed)
After obtaining informed consent, the vaccine is  administered , with no adverse effect noted at the time of administration.  

## 2019-12-16 NOTE — Assessment & Plan Note (Signed)
Deteriorated, inc lantus to 45 units daily Helen Mahoney is reminded of the importance of commitment to daily physical activity for 30 minutes or more, as able and the need to limit carbohydrate intake to 30 to 60 grams per meal to help with blood sugar control.   The need to take medication as prescribed, test blood sugar as directed, and to call between visits if there is a concern that blood sugar is uncontrolled is also discussed.   Helen Mahoney is reminded of the importance of daily foot exam, annual eye examination, and good blood sugar, blood pressure and cholesterol control.  Diabetic Labs Latest Ref Rng & Units 09/13/2019 03/30/2019 03/01/2019 10/12/2018 08/27/2018  HbA1c <5.7 % of total Hgb - 6.5(H) - 6.6(H) -  Microalbumin mg/dL - 0.9 - - -  Micro/Creat Ratio 0.0 - 30.0 mg/g creat - - - - -  Chol <200 mg/dL - 174 - - -  HDL > OR = 50 mg/dL - 52 - - -  Calc LDL mg/dL (calc) - 106(H) - - -  Triglycerides <150 mg/dL - 71 - - -  Creatinine 0.44 - 1.00 mg/dL 0.97 0.90 1.03(H) 0.79 0.92   BP/Weight 12/16/2019 11/22/2019 10/22/2019 09/21/2019 08/16/2019 07/12/2019 06/14/2444  Systolic BP 950 722 575 051 833 582 518  Diastolic BP 77 76 81 70 74 69 79  Wt. (Lbs) 238 - - 241.6 - - -  BMI 35.15 - - 39 - - -   Foot/eye exam completion dates Latest Ref Rng & Units 12/16/2019 03/22/2019  Eye Exam No Retinopathy - No Retinopathy  Foot exam Order - - -  Foot Form Completion - Done -

## 2019-12-17 ENCOUNTER — Other Ambulatory Visit: Payer: Self-pay

## 2019-12-17 LAB — LIPID PANEL
Chol/HDL Ratio: 2.8 ratio (ref 0.0–4.4)
Cholesterol, Total: 154 mg/dL (ref 100–199)
HDL: 55 mg/dL (ref >39)
LDL Chol Calc (NIH): 84 mg/dL (ref 0–99)
Triglycerides: 76 mg/dL (ref 0–149)
VLDL Cholesterol Cal: 15 mg/dL (ref 5–40)

## 2019-12-17 LAB — CMP14+EGFR
ALT: 19 IU/L (ref 0–32)
AST: 19 IU/L (ref 0–40)
Albumin/Globulin Ratio: 1.3 (ref 1.2–2.2)
Albumin: 3.9 g/dL (ref 3.8–4.8)
Alkaline Phosphatase: 127 IU/L — ABNORMAL HIGH (ref 48–121)
BUN/Creatinine Ratio: 11 — ABNORMAL LOW (ref 12–28)
BUN: 12 mg/dL (ref 8–27)
Bilirubin Total: 0.3 mg/dL (ref 0.0–1.2)
CO2: 22 mmol/L (ref 20–29)
Calcium: 9.2 mg/dL (ref 8.7–10.3)
Chloride: 101 mmol/L (ref 96–106)
Creatinine, Ser: 1.09 mg/dL — ABNORMAL HIGH (ref 0.57–1.00)
GFR calc Af Amer: 60 mL/min/{1.73_m2} (ref >59)
GFR calc non Af Amer: 52 mL/min/{1.73_m2} — ABNORMAL LOW (ref >59)
Globulin, Total: 2.9 g/dL (ref 1.5–4.5)
Glucose: 88 mg/dL (ref 65–99)
Potassium: 4.2 mmol/L (ref 3.5–5.2)
Sodium: 139 mmol/L (ref 134–144)
Total Protein: 6.8 g/dL (ref 6.0–8.5)

## 2019-12-17 LAB — TSH: TSH: 1.79 u[IU]/mL (ref 0.450–4.500)

## 2019-12-17 MED ORDER — METFORMIN HCL 1000 MG PO TABS: 1000.0000 mg | ORAL_TABLET | Freq: Two times a day (BID) | ORAL | 0 refills | Status: DC

## 2019-12-21 ENCOUNTER — Telehealth: Payer: Self-pay

## 2019-12-21 ENCOUNTER — Other Ambulatory Visit: Payer: Self-pay

## 2019-12-21 MED ORDER — METFORMIN HCL 1000 MG PO TABS
1000.0000 mg | ORAL_TABLET | Freq: Two times a day (BID) | ORAL | 0 refills | Status: DC
Start: 2019-12-21 — End: 2020-04-24

## 2019-12-21 MED ORDER — LISINOPRIL 10 MG PO TABS: 10.0000 mg | ORAL_TABLET | Freq: Every day | ORAL | 0 refills | Status: DC

## 2019-12-21 NOTE — Telephone Encounter (Signed)
Medications refilled

## 2019-12-21 NOTE — Telephone Encounter (Signed)
Pt is calling in she needs  Metformin 1000 mg  Lisinopril 10mg   Called into [facility]

## 2019-12-23 ENCOUNTER — Inpatient Hospital Stay (HOSPITAL_COMMUNITY): Payer: Medicare HMO | Attending: Internal Medicine

## 2019-12-23 ENCOUNTER — Encounter (HOSPITAL_COMMUNITY): Payer: Self-pay

## 2019-12-23 ENCOUNTER — Other Ambulatory Visit: Payer: Self-pay

## 2019-12-23 VITALS — BP 134/74 | HR 86

## 2019-12-23 DIAGNOSIS — E538 Deficiency of other specified B group vitamins: Secondary | ICD-10-CM | POA: Insufficient documentation

## 2019-12-23 DIAGNOSIS — D509 Iron deficiency anemia, unspecified: Secondary | ICD-10-CM | POA: Insufficient documentation

## 2019-12-23 MED ORDER — CYANOCOBALAMIN 1000 MCG/ML IJ SOLN
1000.0000 ug | Freq: Once | INTRAMUSCULAR | Status: AC
  Administered 2019-12-23: 1000 ug via INTRAMUSCULAR

## 2019-12-23 MED ORDER — CYANOCOBALAMIN 1000 MCG/ML IJ SOLN
INTRAMUSCULAR | Status: AC
  Filled 2019-12-23: qty 1

## 2019-12-23 NOTE — Patient Instructions (Signed)
[facility]  Discharge Instructions:   _______________________________________________________________  Thank you for choosing [facility] at [facility] to provide your oncology and hematology care.  To afford each patient quality time with our providers, please arrive at least 15 minutes before your scheduled appointment.  You need to re-schedule your appointment if you arrive 10 or more minutes late.  We strive to give you quality time with our providers, and arriving late affects you and other patients whose appointments are after yours.  Also, if you no show three or more times for appointments you may be dismissed from the clinic.  Again, thank you for choosing [facility] at [facility]. Our hope is that these requests will allow you access to exceptional care and in a timely manner. _______________________________________________________________  If you have questions after your visit, please contact our office at [phone] between the hours of 8:30 a.m. and 5:00 p.m. Voicemails left after 4:30 p.m. will not be returned until the following business day. _______________________________________________________________  For prescription refill requests, have your pharmacy contact our office. _______________________________________________________________  Recommendations made by the consultant and any test results will be sent to your referring physician. _______________________________________________________________ 

## 2019-12-23 NOTE — Progress Notes (Signed)
Patient presents today for B12 injection. Vital signs stable. No complaints today.   Helen Mahoney presents today for injection per MD orders. B12  administered IM in right Deltoid. Administration without incident. Patient tolerated well.  No complaints at this time. Discharged from clinic ambulatory. F/U with [facility] as scheduled.

## 2019-12-27 ENCOUNTER — Telehealth: Payer: Medicare HMO

## 2019-12-27 ENCOUNTER — Other Ambulatory Visit: Payer: Self-pay | Admitting: Family Medicine

## 2019-12-28 ENCOUNTER — Other Ambulatory Visit: Payer: Self-pay

## 2019-12-28 ENCOUNTER — Telehealth (INDEPENDENT_AMBULATORY_CARE_PROVIDER_SITE_OTHER): Payer: Medicare HMO | Admitting: Family Medicine

## 2019-12-28 ENCOUNTER — Encounter: Payer: Self-pay | Admitting: Family Medicine

## 2019-12-28 ENCOUNTER — Telehealth: Payer: Medicare HMO

## 2019-12-28 VITALS — BP 134/74 | Ht 69.0 in | Wt 238.0 lb

## 2019-12-28 DIAGNOSIS — Z0000 Encounter for general adult medical examination without abnormal findings: Secondary | ICD-10-CM

## 2019-12-28 NOTE — Patient Instructions (Signed)
[name] , Thank you for taking time to come for your Medicare Wellness Visit. I appreciate your ongoing commitment to your health goals. Please review the following plan we discussed and let me know if I can assist you in the future.   Please continue to practice social distancing to keep you, your family, and our community safe.  If you must go out, please wear a Mask and practice good handwashing.  Screening recommendations/referrals: Colonoscopy: Up-to-date Mammogram: Up-to-date Bone Density: Up-to-date Recommended yearly ophthalmology/optometry visit for glaucoma screening and checkup Recommended yearly dental visit for hygiene and checkup  Vaccinations: Influenza vaccine: Completed Pneumococcal vaccine: Up-to-date Tdap vaccine: Up-to-date Shingles vaccine: Up-to-date  Advanced directives: You have the paperwork let us know if you have any questions or concerns  Conditions/risks identified: Blood pressure and diabetes control, falls  Next appointment: 05/04/2020- as scheduled, 1 yr for AWV    Preventive Care 20 Years and Older, Female Preventive care refers to lifestyle choices and visits with your health care provider that can promote health and wellness. What does preventive care include?  A yearly physical exam. This is also called an annual well check.  Dental exams once or twice a year.  Routine eye exams. Ask your health care provider how often you should have your eyes checked.  Personal lifestyle choices, including:  Daily care of your teeth and gums.  Regular physical activity.  Eating a healthy diet.  Avoiding tobacco and drug use.  Limiting alcohol use.  Practicing safe sex.  Taking low-dose aspirin every day.  Taking vitamin and mineral supplements as recommended by your health care provider. What happens during an annual well check? The services and screenings done by your health care provider during your annual well check will depend on your  age, overall health, lifestyle risk factors, and family history of disease. Counseling  Your health care provider may ask you questions about your:  Alcohol use.  Tobacco use.  Drug use.  Emotional well-being.  Home and relationship well-being.  Sexual activity.  Eating habits.  History of falls.  Memory and ability to understand (cognition).  Work and work Statistician.  Reproductive health. Screening  You may have the following tests or measurements:  Height, weight, and BMI.  Blood pressure.  Lipid and cholesterol levels. These may be checked every 5 years, or more frequently if you are over 61 years old.  Skin check.  Lung cancer screening. You may have this screening every year starting at age 43 if you have a 30-pack-year history of smoking and currently smoke or have quit within the past 15 years.  Fecal occult blood test (FOBT) of the stool. You may have this test every year starting at age 35.  Flexible sigmoidoscopy or colonoscopy. You may have a sigmoidoscopy every 5 years or a colonoscopy every 10 years starting at age 12.  Hepatitis C blood test.  Hepatitis B blood test.  Sexually transmitted disease (STD) testing.  Diabetes screening. This is done by checking your blood sugar (glucose) after you have not eaten for a while (fasting). You may have this done every 1-3 years.  Bone density scan. This is done to screen for osteoporosis. You may have this done starting at age 39.  Mammogram. This may be done every 1-2 years. Talk to your health care provider about how often you should have regular mammograms. Talk with your health care provider about your test results, treatment options, and if necessary, the need for more tests. Vaccines  Your health care provider may recommend certain vaccines, such as:  Influenza vaccine. This is recommended every year.  Tetanus, diphtheria, and acellular pertussis (Tdap, Td) vaccine. You may need a Td booster  every 10 years.  Zoster vaccine. You may need this after age 31.  Pneumococcal 13-valent conjugate (PCV13) vaccine. One dose is recommended after age 47.  Pneumococcal polysaccharide (PPSV23) vaccine. One dose is recommended after age 60. Talk to your health care provider about which screenings and vaccines you need and how often you need them. This information is not intended to replace advice given to you by your health care provider. Make sure you discuss any questions you have with your health care provider. Document Released: 05/12/2015 Document Revised: 01/03/2016 Document Reviewed: 02/14/2015 Elsevier Interactive Patient Education  2017 [place] Prevention in the Home Falls can cause injuries. They can happen to people of all ages. There are many things you can do to make your home safe and to help prevent falls. What can I do on the outside of my home?  Regularly fix the edges of walkways and driveways and fix any cracks.  Remove anything that might make you trip as you walk through a door, such as a raised step or threshold.  Trim any bushes or trees on the path to your home.  Use bright outdoor lighting.  Clear any walking paths of anything that might make someone trip, such as rocks or tools.  Regularly check to see if handrails are loose or broken. Make sure that both sides of any steps have handrails.  Any raised decks and porches should have guardrails on the edges.  Have any leaves, snow, or ice cleared regularly.  Use sand or salt on walking paths during winter.  Clean up any spills in your garage right away. This includes oil or grease spills. What can I do in the bathroom?  Use night lights.  Install grab bars by the toilet and in the tub and shower. Do not use towel bars as grab bars.  Use non-skid mats or decals in the tub or shower.  If you need to sit down in the shower, use a plastic, non-slip stool.  Keep the floor dry. Clean up any  water that spills on the floor as soon as it happens.  Remove soap buildup in the tub or shower regularly.  Attach bath mats securely with double-sided non-slip rug tape.  Do not have throw rugs and other things on the floor that can make you trip. What can I do in the bedroom?  Use night lights.  Make sure that you have a light by your bed that is easy to reach.  Do not use any sheets or blankets that are too big for your bed. They should not hang down onto the floor.  Have a firm chair that has side arms. You can use this for support while you get dressed.  Do not have throw rugs and other things on the floor that can make you trip. What can I do in the kitchen?  Clean up any spills right away.  Avoid walking on wet floors.  Keep items that you use a lot in easy-to-reach places.  If you need to reach something above you, use a strong step stool that has a grab bar.  Keep electrical cords out of the way.  Do not use floor polish or wax that makes floors slippery. If you must use wax, use non-skid floor wax.  Do  not have throw rugs and other things on the floor that can make you trip. What can I do with my stairs?  Do not leave any items on the stairs.  Make sure that there are handrails on both sides of the stairs and use them. Fix handrails that are broken or loose. Make sure that handrails are as long as the stairways.  Check any carpeting to make sure that it is firmly attached to the stairs. Fix any carpet that is loose or worn.  Avoid having throw rugs at the top or bottom of the stairs. If you do have throw rugs, attach them to the floor with carpet tape.  Make sure that you have a light switch at the top of the stairs and the bottom of the stairs. If you do not have them, ask someone to add them for you. What else can I do to help prevent falls?  Wear shoes that:  Do not have high heels.  Have rubber bottoms.  Are comfortable and fit you well.  Are closed  at the toe. Do not wear sandals.  If you use a stepladder:  Make sure that it is fully opened. Do not climb a closed stepladder.  Make sure that both sides of the stepladder are locked into place.  Ask someone to hold it for you, if possible.  Clearly mark and make sure that you can see:  Any grab bars or handrails.  First and last steps.  Where the edge of each step is.  Use tools that help you move around (mobility aids) if they are needed. These include:  Canes.  Walkers.  Scooters.  Crutches.  Turn on the lights when you go into a dark area. Replace any light bulbs as soon as they burn out.  Set up your furniture so you have a clear path. Avoid moving your furniture around.  If any of your floors are uneven, fix them.  If there are any pets around you, be aware of where they are.  Review your medicines with your doctor. Some medicines can make you feel dizzy. This can increase your chance of falling. Ask your doctor what other things that you can do to help prevent falls. This information is not intended to replace advice given to you by your health care provider. Make sure you discuss any questions you have with your health care provider. Document Released: 02/09/2009 Document Revised: 09/21/2015 Document Reviewed: 05/20/2014 Elsevier Interactive Patient Education  2017 [employer].

## 2019-12-28 NOTE — Progress Notes (Signed)
Subjective:   Helen Mahoney is a 69 y.o. female who presents for Medicare Annual (Subsequent) preventive examination.  Method of visit: Telephone  Location of Patient: Home Location of Provider: Office Consent was obtain for visit over the telephone. Services rendered by provider: Visit was performed via telephone/audio  I verified that I am speaking with the correct person using two identifiers.   Review of Systems    yes Cardiac Risk Factors include: none     Objective:    Today's Vitals   12/28/19 1307 12/28/19 1309  BP: 134/74   Weight: 238 lb (108 kg)   Height: 5\' 9"  (1.753 m)   PainSc: 0-No pain 0-No pain   Body mass index is 35.15 kg/m.  Advanced Directives 12/28/2019 12/23/2019 11/22/2019 09/21/2019 08/16/2019 05/10/2019 04/05/2019  Does Patient Have a Medical Advance Directive? No No No No No No No  Would patient like information on creating a medical advance directive? No - Patient declined No - Patient declined No - Patient declined No - Patient declined - No - Patient declined No - Patient declined  Pre-existing out of facility DNR order (yellow form or pink MOST form) - - - - - - -    Current Medications (verified) Outpatient Encounter Medications as of 12/28/2019  Medication Sig   Alcohol Swabs (B-D SINGLE USE SWABS REGULAR) PADS USE AS DIRECTED THREE TIMES DAILY   aspirin 81 MG tablet Take 81 mg by mouth daily.   Calcium Citrate-Vitamin D (CITRACAL PETITES/VITAMIN D) 200-250 MG-UNIT TABS Take 1 tablet by mouth daily.    Cholecalciferol (VITAMIN D3) 2000 units TABS Take 1 tablet by mouth daily.   Ferrous Sulfate (IRON) 325 (65 FE) MG TABS Take 1 tablet by mouth daily.   glipiZIDE (GLUCOTROL) 10 MG tablet TAKE 1 TABLET TWICE DAILY BEFORE MEALS   insulin glargine (LANTUS) 100 UNIT/ML Solostar Pen Inject 45 Units into the skin daily.   Insulin Pen Needle (RELION PEN NEEDLE 31G/8MM) 31G X 8 MM MISC USE 1  ONCE DAILY AS DIRECTED dx e11.65    lisinopril (ZESTRIL) 10 MG tablet Take 1 tablet (10 mg total) by mouth daily.   metFORMIN (GLUCOPHAGE) 1000 MG tablet Take 1 tablet (1,000 mg total) by mouth 2 (two) times daily with a meal.   Multiple Vitamin (MULTIVITAMINS PO) Take 1 tablet by mouth daily.    pravastatin (PRAVACHOL) 80 MG tablet TAKE 1 TABLET ONE TIME DAILY IN THE EVENING   TRUE METRIX BLOOD GLUCOSE TEST test strip TEST THREE TIMES DAILY   TRUEplus Lancets 33G MISC CHECK BLOOD SUGAR THREE TIMES DAILY   No facility-administered encounter medications on file as of 12/28/2019.    Allergies (verified) Tetanus toxoids and Penicillins   History: Past Medical History:  Diagnosis Date   Alpha-0- thalassemia trait/carrier 05/18/2015   2013: TCS/EGD 2017: TCS/EGD HYPERPLASTIC GASTRIC POLYPS, LYMPHOCYTIC GASTRITIS    B12 deficiency 06/16/2015   Colon polyps    Diabetes mellitus, type 2 ([place])    Hyperlipidemia    Hypertension    Microcytic anemia 05/18/2015   2013: TCS/EGD 2017: TCS/EGD HYPERPLASTIC GASTRIC POLYPS, LYMPHOCYTIC GASTRITIS    Obesity    Past Surgical History:  Procedure Laterality Date   bilateral tubal ligation  1979   CHOLECYSTECTOMY  2001   ACUTE CHOLECYSTITIS/GALLSTONES   COLONOSCOPY  05/14/2011   Dr. Oneida Alar: sessile polyp in sigmoid colon, internal hemorrhoids, hyerplastic polyps   COLONOSCOPY N/A 05/15/2015   Procedure: COLONOSCOPY;  Surgeon: Danie Binder, MD;  [facility]  [facility];  Service: Endoscopy;  Laterality: N/A;  0830   COLONOSCOPY W/ POLYPECTOMY  NOV 2008 [facility] ANEMIA   POLYP NO RETRIEVED   COLONOSCOPY W/ POLYPECTOMY  2006 DR. SMTH   POLYP?   ESOPHAGOGASTRODUODENOSCOPY  05/14/2011   Dr. Oneida Alar: sessile polyps in the cardia, mild gastritis. Chronic duodenitis consistent with peptic duodenitis, chronic active H.pylori gastritis.    ESOPHAGOGASTRODUODENOSCOPY N/A 05/15/2015   Procedure: ESOPHAGOGASTRODUODENOSCOPY (EGD);  Surgeon: Danie Binder, MD;  [facility];   Service: Endoscopy;  Laterality: N/A;   GIVENS CAPSULE STUDY N/A 06/02/2015   Procedure: GIVENS CAPSULE STUDY;  Surgeon: Danie Binder, MD;  [facility]: [facility];  Service: Endoscopy;  Laterality: N/A;  0700   TUBAL LIGATION     UPPER GASTROINTESTINAL ENDOSCOPY  NOV 2008 [facility] ANEMIA   NL EXAM, urease neg   Family History  Problem Relation Age of Onset   Heart disease Mother    Diabetes Mother    Hypertension Mother    Stroke Mother    Breast cancer Mother    Diabetes Father    Heart attack Father    Kidney disease Father    Hypertension Sister    Diabetes Sister    Diabetes Sister    Mental illness Sister    Hypertension Sister    Hepatitis C Sister    Mental illness Sister    Diabetes Sister    Hypertension Sister    Mental illness Sister    Colon cancer Sister 39   Diabetes Brother    Social History   Socioeconomic History   Marital status: Married    Spouse name: Not on file   Number of children: Not on file   Years of education: Not on file   Highest education level: Not on file  Occupational History   Not on file  Tobacco Use   Smoking status: Never Smoker   Smokeless tobacco: Never Used  Vaping Use   Vaping Use: Never used  Substance and Sexual Activity   Alcohol use: No   Drug use: No   Sexual activity: Not Currently  Other Topics Concern   Not on file  Social History Narrative   Not on file   Social Determinants of Health   Financial Resource Strain: Low Risk    Difficulty of Paying Living Expenses: Not hard at all  Food Insecurity: No Food Insecurity   Worried About Charity fundraiser in the Last Year: Never true   [address] in the Last Year: Never true  Transportation Needs: No Transportation Needs   Lack of Transportation (Medical): No   Lack of Transportation (Non-Medical): No  Physical Activity: Insufficiently Active   Days of Exercise per Week: 3 days   Minutes of Exercise per Session: 30  min  Stress: No Stress Concern Present   Feeling of Stress : Not at all  Social Connections: Moderately Isolated   Frequency of Communication with Friends and Family: More than three times a week   Frequency of Social Gatherings with Friends and Family: More than three times a week   Attends Religious Services: More than 4 times per year   Active Member of [employer] or Organizations: No   Attends Archivist Meetings: Never   Marital Status: Widowed    Tobacco Counseling Counseling given: Not Answered   Clinical Intake:  Pre-visit preparation completed: Yes  Pain : No/denies pain Pain Score: 0-No pain     BMI -  recorded: 35.13 Nutritional Status: BMI > 30  Obese Nutritional Risks: None Diabetes: Yes CBG done?: No Did pt. bring in CBG monitor from home?: No  How often do you need to have someone help you when you read instructions, pamphlets, or other written materials from your doctor or pharmacy?: 1 - Never What is the last grade level you completed in school?: 12  Diabetic?yes  Interpreter Needed?: No      Activities of Daily Living In your present state of health, do you have any difficulty performing the following activities: 12/28/2019  Hearing? N  Vision? N  Difficulty concentrating or making decisions? N  Walking or climbing stairs? N  Dressing or bathing? N  Doing errands, shopping? N  Preparing Food and eating ? N  Using the Toilet? N  In the past six months, have you accidently leaked urine? N  Do you have problems with loss of bowel control? N  Managing your Medications? N  Managing your Finances? N  Housekeeping or managing your Housekeeping? N  Some recent data might be hidden    Patient Care Team: Fayrene Helper, MD as PCP - [facility], Marga Melnick, MD (Inactive) as Consulting Physician (Gastroenterology) Patrici Ranks, MD (Inactive) as Consulting Physician (Hematology and Oncology)  Indicate any recent Medical  Services you may have received from other than [facility] providers in the past year (date may be approximate).     Assessment:   This is a routine wellness examination for [place].  Hearing/Vision screen No exam data present  Dietary issues and exercise activities discussed: Current Exercise Habits: Home exercise routine, Time (Minutes): 30, Frequency (Times/Week): 3, Weekly Exercise (Minutes/Week): 90, Intensity: Mild, Exercise limited by: None identified  Goals     Exercise 3x per week (30 min per time)     Recommend starting a routine exercise program at least 3 days a week for 30-45 minutes at a time as tolerated.       Increase physical activity     Start walking for 30 mins/5 days a week      Patient Stated     Would like to continue to be active as she takes care of grandkids at this time       Depression Screen PHQ 2/9 Scores 12/28/2019 12/16/2019 04/06/2019 12/02/2018 10/26/2018 07/01/2018 05/25/2018  PHQ - 2 Score 0 0 0 0 0 0 0  PHQ- 9 Score - - - - - - -    Fall Risk Fall Risk  12/28/2019 12/16/2019 04/06/2019 12/02/2018 10/26/2018  Falls in the past year? 0 0 0 0 0  Number falls in past yr: 0 - 0 - 0  Injury with Fall? 0 - 0 0 0  Risk for fall due to : No Fall Risks - - - -  Follow up Falls evaluation completed - - - -    Any stairs in or around the home? Yes  If so, are there any without handrails? Yes  Home free of loose throw rugs in walkways, pet beds, electrical cords, etc? Yes  Adequate lighting in your home to reduce risk of falls? Yes   ASSISTIVE DEVICES UTILIZED TO PREVENT FALLS:  Life alert? No  Use of a cane, walker or w/c? No  Grab bars in the bathroom? No  Shower chair or bench in shower? No  Elevated toilet seat or a handicapped toilet? Yes   TIMED UP AND GO:  Was the test performed? No .  Length of time to ambulate  10 feet: NA sec.     Cognitive Function: MMSE - Mini Mental State Exam 12/28/2019  Not completed: Unable to complete     6CIT  Screen 12/28/2019 12/02/2018 11/25/2017 11/04/2016  What Year? 0 points 0 points 0 points 0 points  What month? 0 points 0 points 0 points 0 points  What time? 0 points 0 points 0 points 0 points  Count back from 20 0 points 0 points 0 points 0 points  Months in reverse 0 points 0 points 2 points 0 points  Repeat phrase 0 points 0 points - 0 points  Total Score 0 0 - 0    Immunizations Immunization History  Administered Date(s) Administered   Fluad Quad(high Dose 65+) 01/11/2019   Influenza Split 02/14/2011, 03/18/2012   Influenza Whole 02/03/2007, 01/25/2008, 01/20/2009, 01/04/2010   Influenza, High Dose Seasonal PF 07/01/2018   Influenza,inj,Quad PF,6+ Mos 02/18/2013, 03/14/2014, 01/12/2015, 02/22/2016, 01/27/2017, 12/18/2017, 12/16/2019   Moderna SARS-COVID-2 Vaccination 06/11/2019, 07/09/2019   Pneumococcal Conjugate-13 07/13/2014   Pneumococcal Polysaccharide-23 09/14/2009, 09/11/2015   Zoster 10/16/2010    TDAP status: Up to date Flu Vaccine status: Up to date Pneumococcal vaccine status: Up to date Covid-19 vaccine status: Completed vaccines  Qualifies for Shingles Vaccine? Yes   Zostavax completed Yes   Shingrix Completed?: Yes  Screening Tests Health Maintenance  Topic Date Due   HEMOGLOBIN A1C  09/28/2019   OPHTHALMOLOGY EXAM  03/21/2020   COLONOSCOPY  05/14/2020   FOOT EXAM  12/15/2020   MAMMOGRAM  02/21/2021   INFLUENZA VACCINE  Completed   DEXA SCAN  Completed   COVID-19 Vaccine  Completed   Hepatitis C Screening  Completed   PNA vac Low Risk Adult  Completed   TETANUS/TDAP  Discontinued    Health Maintenance  Health Maintenance Due  Topic Date Due   HEMOGLOBIN A1C  09/28/2019    Colorectal cancer screening: Completed 05-15-15. Repeat every 5 years Mammogram status: Completed 02-22-19. Repeat every year Bone Density status: Completed 02/14/16. Results reflect: Bone density results: NORMAL. Repeat every 5 years.  Lung Cancer  Screening: (Low Dose CT Chest recommended if Age 57-80 years, 30 pack-year currently smoking OR have quit w/in 15years.) does not qualify.   Lung Cancer Screening Referral: no  Additional Screening:  Hepatitis C Screening: does not qualify; Completed   Vision Screening: Recommended annual ophthalmology exams for early detection of glaucoma and other disorders of the eye. Is the patient up to date with their annual eye exam?  Yes  Who is the provider or what is the name of the office in which the patient attends annual eye exams? [facility] Dr Jorja Loa If pt is not established with a provider, would they like to be referred to a provider to establish care? No .   Dental Screening: Recommended annual dental exams for proper oral hygiene  Community Resource Referral / Chronic Care Management: CRR required this visit?  No   CCM required this visit?  No      Plan:     1. Encounter for Medicare annual wellness exam   I have personally reviewed and noted the following in the patients chart:    Medical and social history  Use of alcohol, tobacco or illicit drugs   Current medications and supplements  Functional ability and status  Nutritional status  Physical activity  Advanced directives  List of other physicians  Hospitalizations, surgeries, and ER visits in previous 12 months  Vitals  Screenings to include cognitive, depression, and  falls  Referrals and appointments  In addition, I have reviewed and discussed with patient certain preventive protocols, quality metrics, and best practice recommendations. A written personalized care plan for preventive services as well as general preventive health recommendations were provided to patient.     Perlie Mayo, NP   12/28/2019   I provided 20 minutes of non-face-to-face time during this encounter.

## 2020-01-05 ENCOUNTER — Other Ambulatory Visit: Payer: Self-pay | Admitting: *Deleted

## 2020-01-05 MED ORDER — PRAVASTATIN SODIUM 80 MG PO TABS: ORAL_TABLET | ORAL | 0 refills | Status: DC

## 2020-01-11 ENCOUNTER — Encounter: Payer: Medicare HMO | Admitting: Family Medicine

## 2020-01-12 ENCOUNTER — Other Ambulatory Visit: Payer: Self-pay | Admitting: Family Medicine

## 2020-01-14 ENCOUNTER — Other Ambulatory Visit (HOSPITAL_COMMUNITY): Payer: Self-pay | Admitting: Family Medicine

## 2020-01-14 DIAGNOSIS — Z1231 Encounter for screening mammogram for malignant neoplasm of breast: Secondary | ICD-10-CM

## 2020-01-24 ENCOUNTER — Encounter (HOSPITAL_COMMUNITY): Payer: Self-pay

## 2020-01-24 ENCOUNTER — Other Ambulatory Visit: Payer: Self-pay

## 2020-01-24 ENCOUNTER — Inpatient Hospital Stay (HOSPITAL_COMMUNITY): Payer: Medicare HMO | Attending: Internal Medicine

## 2020-01-24 VITALS — BP 136/69 | HR 84 | Temp 97.0°F | Resp 18

## 2020-01-24 DIAGNOSIS — E538 Deficiency of other specified B group vitamins: Secondary | ICD-10-CM | POA: Insufficient documentation

## 2020-01-24 DIAGNOSIS — D509 Iron deficiency anemia, unspecified: Secondary | ICD-10-CM | POA: Insufficient documentation

## 2020-01-24 DIAGNOSIS — Z79899 Other long term (current) drug therapy: Secondary | ICD-10-CM | POA: Diagnosis not present

## 2020-01-24 DIAGNOSIS — D563 Thalassemia minor: Secondary | ICD-10-CM | POA: Diagnosis not present

## 2020-01-24 MED ORDER — CYANOCOBALAMIN 1000 MCG/ML IJ SOLN
1000.0000 ug | Freq: Once | INTRAMUSCULAR | Status: AC
  Administered 2020-01-24: 1000 ug via INTRAMUSCULAR

## 2020-01-24 NOTE — Progress Notes (Signed)
Helen Mahoney tolerated Vit B12 injection well without complaints or incident. VSS Pt discharged self ambulatory in satisfactory condition

## 2020-01-24 NOTE — Patient Instructions (Signed)
[facility] Discharge Instructions  Received Vit B12 injection today. Follow-up as scheduled   Thank you for choosing [facility] to provide your oncology and hematology care.  To afford each patient quality time with our provider, please arrive at least 15 minutes before your scheduled appointment time.   If you have a lab appointment with the Cancer Center please come in thru the Main Entrance and check in at the main information desk.  You need to re-schedule your appointment should you arrive 10 or more minutes late.  We strive to give you quality time with our providers, and arriving late affects you and other patients whose appointments are after yours.  Also, if you no show three or more times for appointments you may be dismissed from the clinic at the providers discretion.     Again, thank you for choosing [facility].  Our hope is that these requests will decrease the amount of time that you wait before being seen by our physicians.       _____________________________________________________________  Should you have questions after your visit to [facility], please contact our office at [phone] and follow the prompts.  Our office hours are 8:00 a.m. and 4:30 p.m. Monday - Friday.  Please note that voicemails left after 4:00 p.m. may not be returned until the following business day.  We are closed weekends and major holidays.  You do have access to a nurse 24-7, just call the main number to the clinic [phone] and do not press any options, hold on the line and a nurse will answer the phone.    For prescription refill requests, have your pharmacy contact our office and allow 72 hours.    Due to Covid, you will need to wear a mask upon entering the hospital. If you do not have a mask, a mask will be given to you at the Main Entrance upon arrival. For doctor visits, patients may have  1 support person age 18 or older with them. For treatment visits, patients can not have anyone with them due to social distancing guidelines and our immunocompromised population.     

## 2020-02-17 ENCOUNTER — Other Ambulatory Visit: Payer: Self-pay | Admitting: Family Medicine

## 2020-02-21 DIAGNOSIS — H5213 Myopia, bilateral: Secondary | ICD-10-CM | POA: Diagnosis not present

## 2020-02-22 ENCOUNTER — Other Ambulatory Visit: Payer: Self-pay | Admitting: Family Medicine

## 2020-02-23 ENCOUNTER — Encounter (HOSPITAL_COMMUNITY): Payer: Self-pay

## 2020-02-23 ENCOUNTER — Inpatient Hospital Stay (HOSPITAL_COMMUNITY): Payer: Medicare HMO | Attending: Internal Medicine

## 2020-02-23 ENCOUNTER — Ambulatory Visit (HOSPITAL_COMMUNITY)
Admission: RE | Admit: 2020-02-23 | Discharge: 2020-02-23 | Disposition: A | Discharge status: Home / Self Care | Payer: Medicare HMO | Source: Ambulatory Visit | Attending: Family Medicine | Admitting: Family Medicine

## 2020-02-23 ENCOUNTER — Other Ambulatory Visit: Payer: Self-pay

## 2020-02-23 VITALS — BP 141/69 | HR 91 | Temp 97.1°F | Resp 18

## 2020-02-23 DIAGNOSIS — D509 Iron deficiency anemia, unspecified: Secondary | ICD-10-CM | POA: Diagnosis not present

## 2020-02-23 DIAGNOSIS — Z1231 Encounter for screening mammogram for malignant neoplasm of breast: Secondary | ICD-10-CM | POA: Insufficient documentation

## 2020-02-23 DIAGNOSIS — E538 Deficiency of other specified B group vitamins: Secondary | ICD-10-CM | POA: Insufficient documentation

## 2020-02-23 MED ORDER — CYANOCOBALAMIN 1000 MCG/ML IJ SOLN
1000.0000 ug | Freq: Once | INTRAMUSCULAR | Status: AC
  Administered 2020-02-23: 1000 ug via INTRAMUSCULAR

## 2020-02-23 MED ORDER — CYANOCOBALAMIN 1000 MCG/ML IJ SOLN
INTRAMUSCULAR | Status: AC
  Filled 2020-02-23: qty 1

## 2020-02-23 NOTE — Progress Notes (Signed)
Helen Mahoney presents today for injection per the provider's orders.  B12 administration without incident; injection site WNL; see MAR for injection details.  Patient tolerated procedure well and without incident.  No questions or complaints noted at this time.  Discharged ambulatory in stable condition.

## 2020-03-06 DIAGNOSIS — E119 Type 2 diabetes mellitus without complications: Secondary | ICD-10-CM | POA: Diagnosis not present

## 2020-03-06 DIAGNOSIS — H2513 Age-related nuclear cataract, bilateral: Secondary | ICD-10-CM | POA: Diagnosis not present

## 2020-03-07 IMAGING — MG DIGITAL SCREENING BILATERAL MAMMOGRAM WITH TOMO AND CAD
8 series · 8 of 24 positions shown · non-contrast
Comparison: Previous exam(s).

ACR Breast Density Category a: The breast tissue is almost entirely
fatty.

CLINICAL DATA: Screening.

EXAM:
DIGITAL SCREENING BILATERAL MAMMOGRAM WITH TOMO AND CAD

[R CC synth-2D]
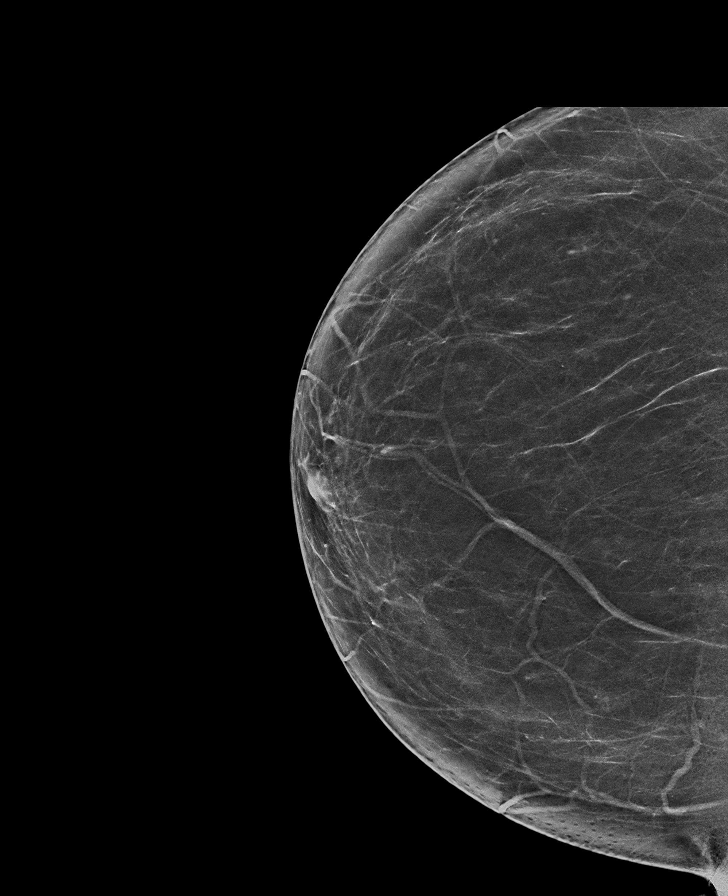

[L CC synth-2D]
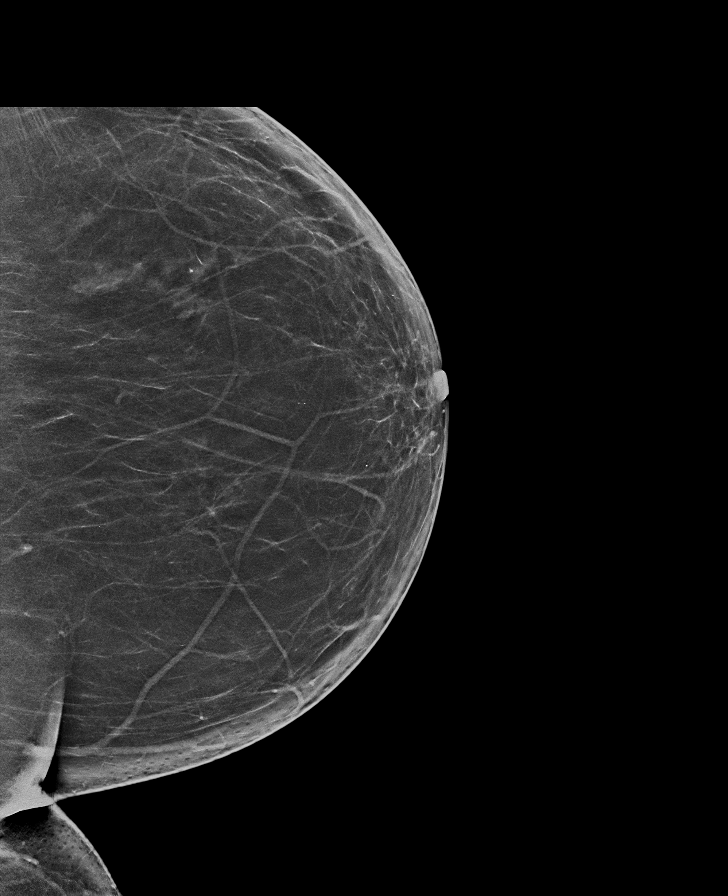

[R MLO synth-2D]
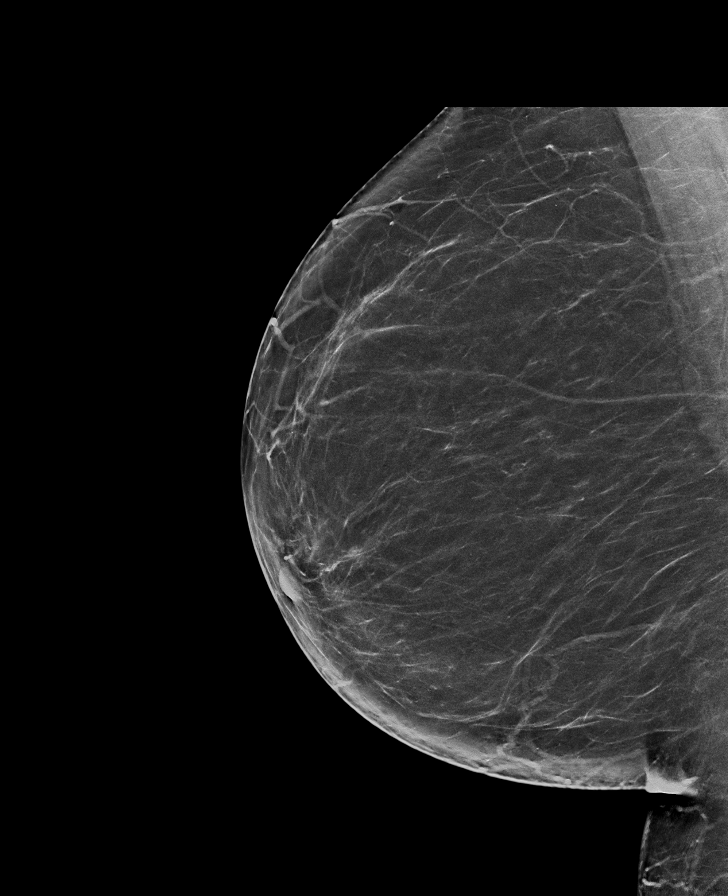

[L MLO synth-2D]
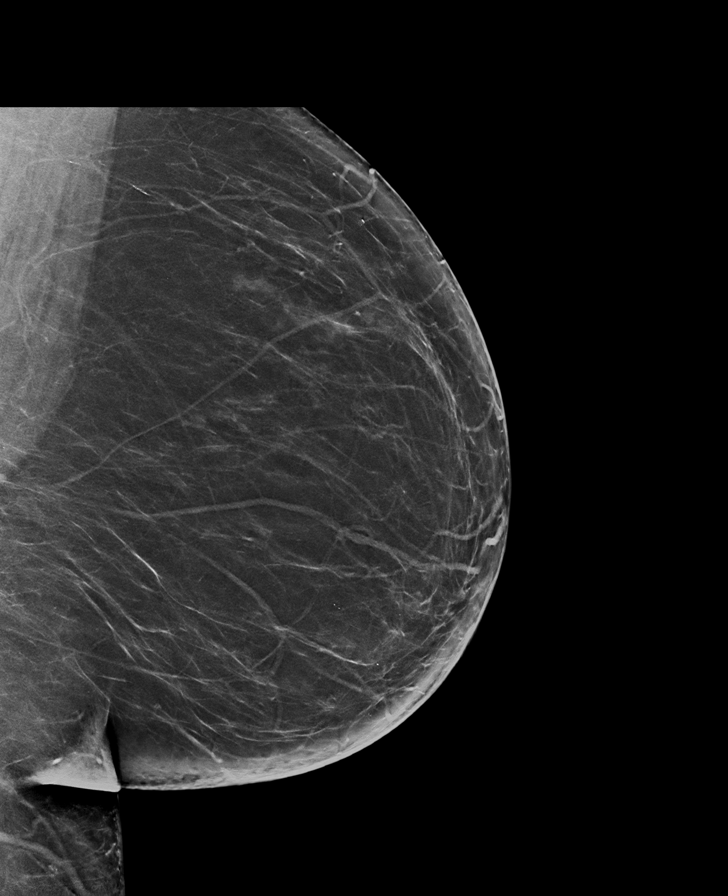

[L MLO tomo · tomo slice 40/79.0]
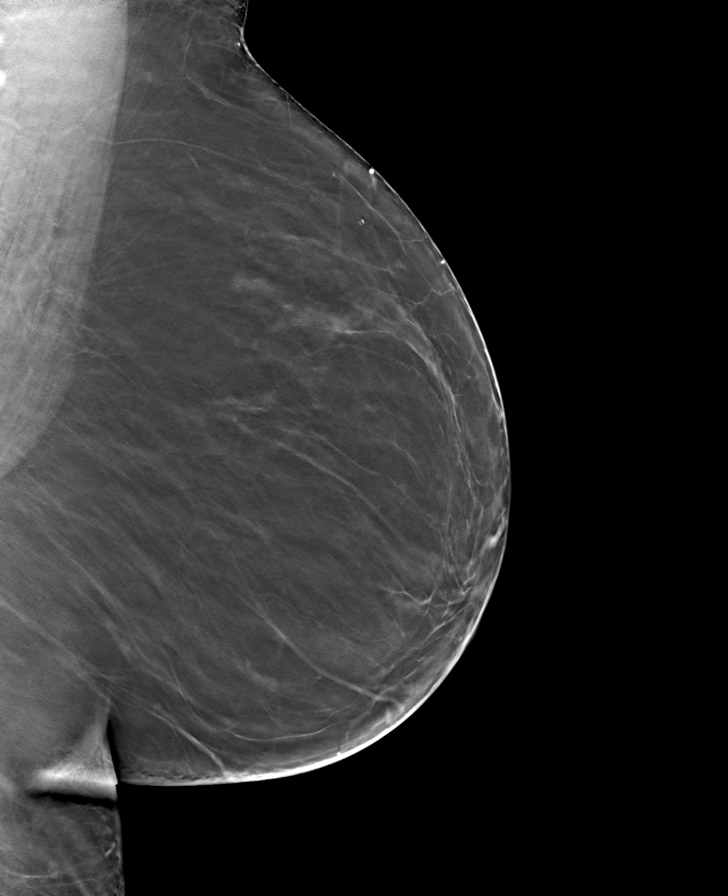

[R MLO tomo · tomo slice 39/77.0]
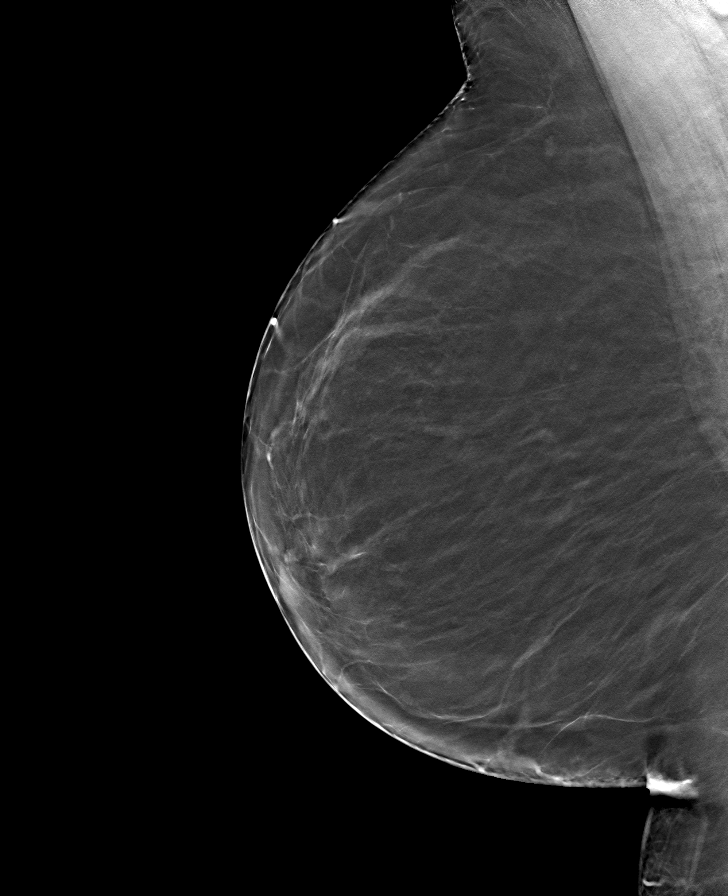

[L CC tomo · tomo slice 38/75.0]
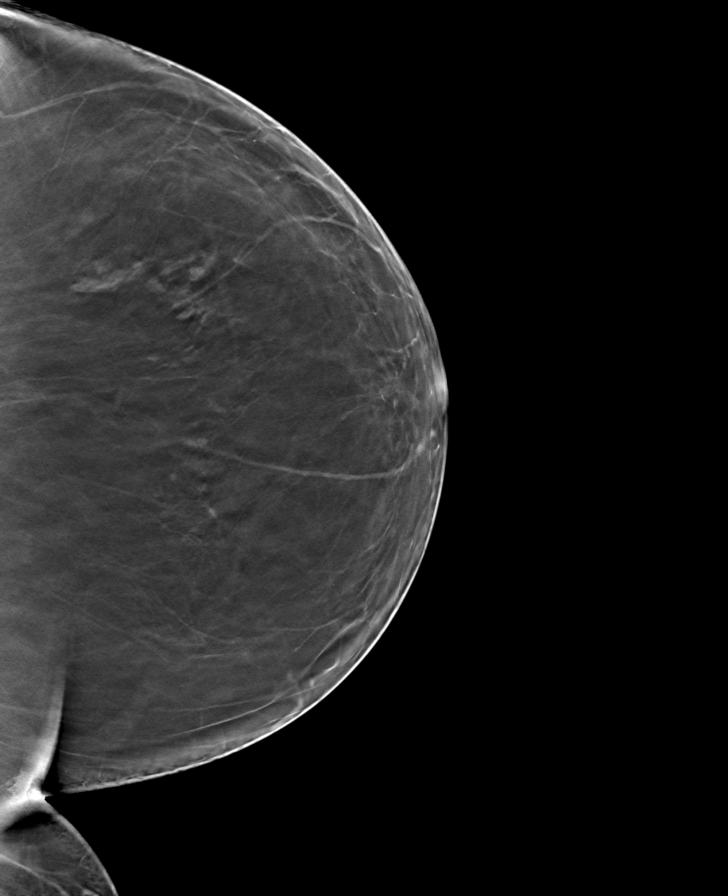

[R CC tomo · tomo slice 37/72.0]
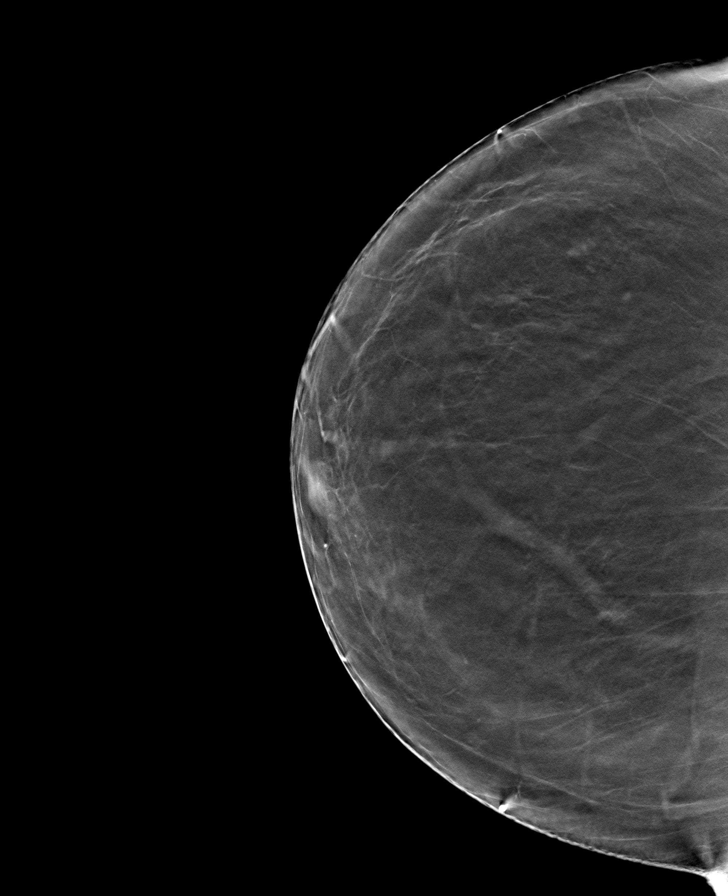

[8 of 24 positions shown; findings below may reference images not displayed]

FINDINGS: There are no findings suspicious for malignancy. Images were
processed with CAD.
IMPRESSION: No mammographic evidence of malignancy. A result letter of this
screening mammogram will be mailed directly to the patient.

RECOMMENDATION:
Screening mammogram in one year. (Code:[id number])

BI-RADS CATEGORY  1: Negative.

## 2020-03-08 ENCOUNTER — Other Ambulatory Visit: Payer: Self-pay | Admitting: Family Medicine

## 2020-03-21 ENCOUNTER — Other Ambulatory Visit (HOSPITAL_COMMUNITY): Payer: Medicare HMO

## 2020-03-21 ENCOUNTER — Other Ambulatory Visit (HOSPITAL_COMMUNITY): Payer: Self-pay

## 2020-03-21 DIAGNOSIS — E538 Deficiency of other specified B group vitamins: Secondary | ICD-10-CM

## 2020-03-22 ENCOUNTER — Encounter (HOSPITAL_COMMUNITY): Payer: Self-pay

## 2020-03-22 ENCOUNTER — Inpatient Hospital Stay (HOSPITAL_COMMUNITY): Payer: Medicare HMO | Attending: Hematology

## 2020-03-22 ENCOUNTER — Inpatient Hospital Stay (HOSPITAL_COMMUNITY): Payer: Medicare HMO

## 2020-03-22 ENCOUNTER — Other Ambulatory Visit: Payer: Self-pay

## 2020-03-22 VITALS — BP 139/66 | HR 95 | Temp 96.8°F | Resp 18

## 2020-03-22 DIAGNOSIS — D509 Iron deficiency anemia, unspecified: Secondary | ICD-10-CM | POA: Insufficient documentation

## 2020-03-22 DIAGNOSIS — D563 Thalassemia minor: Secondary | ICD-10-CM | POA: Diagnosis not present

## 2020-03-22 DIAGNOSIS — E538 Deficiency of other specified B group vitamins: Secondary | ICD-10-CM | POA: Insufficient documentation

## 2020-03-22 LAB — CBC WITH DIFFERENTIAL/PLATELET
Abs Immature Granulocytes: 0.13 10*3/uL — ABNORMAL HIGH (ref 0.00–0.07)
Basophils Absolute: 0.1 10*3/uL (ref 0.0–0.1)
Basophils Relative: 1 %
Eosinophils Absolute: 0.2 10*3/uL (ref 0.0–0.5)
Eosinophils Relative: 3 %
HCT: 37.1 % (ref 36.0–46.0)
Hemoglobin: 11 g/dL — ABNORMAL LOW (ref 12.0–15.0)
Immature Granulocytes: 2 %
Lymphocytes Relative: 20 %
Lymphs Abs: 1.6 10*3/uL (ref 0.7–4.0)
MCH: 21.4 pg — ABNORMAL LOW (ref 26.0–34.0)
MCHC: 29.6 g/dL — ABNORMAL LOW (ref 30.0–36.0)
MCV: 72.3 fL — ABNORMAL LOW (ref 80.0–100.0)
Monocytes Absolute: 0.5 10*3/uL (ref 0.1–1.0)
Monocytes Relative: 5 %
Neutro Abs: 5.8 10*3/uL (ref 1.7–7.7)
Neutrophils Relative %: 69 %
Platelets: 358 10*3/uL (ref 150–400)
RBC: 5.13 MIL/uL — ABNORMAL HIGH (ref 3.87–5.11)
RDW: 16.1 % — ABNORMAL HIGH (ref 11.5–15.5)
WBC: 8.3 10*3/uL (ref 4.0–10.5)
nRBC: 0 % (ref 0.0–0.2)

## 2020-03-22 LAB — FOLATE: Folate: 7.2 ng/mL (ref >5.9)

## 2020-03-22 LAB — COMPREHENSIVE METABOLIC PANEL
ALT: 21 U/L (ref 0–44)
AST: 20 U/L (ref 15–41)
Albumin: 3.4 g/dL — ABNORMAL LOW (ref 3.5–5.0)
Alkaline Phosphatase: 104 U/L (ref 38–126)
Anion gap: 9 (ref 5–15)
BUN: 15 mg/dL (ref 8–23)
CO2: 25 mmol/L (ref 22–32)
Calcium: 9.2 mg/dL (ref 8.9–10.3)
Chloride: 100 mmol/L (ref 98–111)
Creatinine, Ser: 1.1 mg/dL — ABNORMAL HIGH (ref 0.44–1.00)
GFR, Estimated: 54 mL/min — ABNORMAL LOW (ref >60)
Glucose, Bld: 218 mg/dL — ABNORMAL HIGH (ref 70–99)
Potassium: 4.3 mmol/L (ref 3.5–5.1)
Sodium: 134 mmol/L — ABNORMAL LOW (ref 135–145)
Total Bilirubin: 0.4 mg/dL (ref 0.3–1.2)
Total Protein: 7.3 g/dL (ref 6.5–8.1)

## 2020-03-22 LAB — VITAMIN B12: Vitamin B-12: >7500 pg/mL — ABNORMAL HIGH (ref 180–914)

## 2020-03-22 LAB — LACTATE DEHYDROGENASE: LDH: 134 U/L (ref 98–192)

## 2020-03-22 LAB — FERRITIN: Ferritin: 97 ng/mL (ref 11–307)

## 2020-03-22 LAB — IRON AND TIBC
Iron: 50 ug/dL (ref 28–170)
Saturation Ratios: 14 % (ref 10.4–31.8)
TIBC: 346 ug/dL (ref 250–450)
UIBC: 296 ug/dL

## 2020-03-22 LAB — VITAMIN D 25 HYDROXY (VIT D DEFICIENCY, FRACTURES): Vit D, 25-Hydroxy: 52.71 ng/mL (ref 30–100)

## 2020-03-22 MED ORDER — CYANOCOBALAMIN 1000 MCG/ML IJ SOLN
1000.0000 ug | Freq: Once | INTRAMUSCULAR | Status: AC
  Administered 2020-03-22: 1000 ug via INTRAMUSCULAR
  Filled 2020-03-22: qty 1

## 2020-03-22 NOTE — Progress Notes (Signed)
Patient tolerated injection with no complaints voiced.  Site clean and dry with no bruising or swelling noted at site.  Band aid applied.  Vss with discharge and left in satisfactory condition with no s/s of distress noted.  

## 2020-03-27 ENCOUNTER — Ambulatory Visit (HOSPITAL_COMMUNITY): Payer: Medicare HMO | Admitting: Hematology

## 2020-03-28 ENCOUNTER — Other Ambulatory Visit: Payer: Self-pay | Admitting: Family Medicine

## 2020-03-28 ENCOUNTER — Ambulatory Visit (HOSPITAL_COMMUNITY): Payer: Medicare HMO | Admitting: Nurse Practitioner

## 2020-03-28 ENCOUNTER — Ambulatory Visit (HOSPITAL_COMMUNITY): Payer: Medicare HMO

## 2020-03-30 ENCOUNTER — Other Ambulatory Visit: Payer: Self-pay

## 2020-03-30 ENCOUNTER — Inpatient Hospital Stay (HOSPITAL_COMMUNITY): Payer: Medicare HMO | Attending: Internal Medicine | Admitting: Oncology

## 2020-03-30 VITALS — BP 123/66 | HR 88 | Temp 98.2°F | Resp 18 | Wt 241.2 lb

## 2020-03-30 DIAGNOSIS — Z8719 Personal history of other diseases of the digestive system: Secondary | ICD-10-CM | POA: Diagnosis not present

## 2020-03-30 DIAGNOSIS — Z8379 Family history of other diseases of the digestive system: Secondary | ICD-10-CM | POA: Diagnosis not present

## 2020-03-30 DIAGNOSIS — Z818 Family history of other mental and behavioral disorders: Secondary | ICD-10-CM | POA: Diagnosis not present

## 2020-03-30 DIAGNOSIS — Z833 Family history of diabetes mellitus: Secondary | ICD-10-CM | POA: Insufficient documentation

## 2020-03-30 DIAGNOSIS — E785 Hyperlipidemia, unspecified: Secondary | ICD-10-CM | POA: Insufficient documentation

## 2020-03-30 DIAGNOSIS — E538 Deficiency of other specified B group vitamins: Secondary | ICD-10-CM | POA: Insufficient documentation

## 2020-03-30 DIAGNOSIS — Z794 Long term (current) use of insulin: Secondary | ICD-10-CM | POA: Insufficient documentation

## 2020-03-30 DIAGNOSIS — D509 Iron deficiency anemia, unspecified: Secondary | ICD-10-CM | POA: Insufficient documentation

## 2020-03-30 DIAGNOSIS — D508 Other iron deficiency anemias: Secondary | ICD-10-CM | POA: Diagnosis not present

## 2020-03-30 DIAGNOSIS — Z841 Family history of disorders of kidney and ureter: Secondary | ICD-10-CM | POA: Diagnosis not present

## 2020-03-30 DIAGNOSIS — I10 Essential (primary) hypertension: Secondary | ICD-10-CM | POA: Insufficient documentation

## 2020-03-30 DIAGNOSIS — Z823 Family history of stroke: Secondary | ICD-10-CM | POA: Diagnosis not present

## 2020-03-30 DIAGNOSIS — E119 Type 2 diabetes mellitus without complications: Secondary | ICD-10-CM | POA: Diagnosis not present

## 2020-03-30 DIAGNOSIS — Z8249 Family history of ischemic heart disease and other diseases of the circulatory system: Secondary | ICD-10-CM | POA: Diagnosis not present

## 2020-03-30 DIAGNOSIS — Z79899 Other long term (current) drug therapy: Secondary | ICD-10-CM | POA: Diagnosis not present

## 2020-03-30 DIAGNOSIS — Z800 Family history of malignant neoplasm of digestive organs: Secondary | ICD-10-CM | POA: Diagnosis not present

## 2020-03-30 DIAGNOSIS — Z9049 Acquired absence of other specified parts of digestive tract: Secondary | ICD-10-CM | POA: Diagnosis not present

## 2020-03-30 DIAGNOSIS — Z803 Family history of malignant neoplasm of breast: Secondary | ICD-10-CM | POA: Diagnosis not present

## 2020-03-30 DIAGNOSIS — D563 Thalassemia minor: Secondary | ICD-10-CM | POA: Insufficient documentation

## 2020-03-30 DIAGNOSIS — Z880 Allergy status to penicillin: Secondary | ICD-10-CM | POA: Insufficient documentation

## 2020-03-30 NOTE — Progress Notes (Signed)
[place], [place] [address]   CLINIC:  Medical Oncology/Hematology  PCP:  Fayrene Helper, MD [address] [place] [address] [phone]   REASON FOR VISIT: Follow-up for microcytic anemia  CURRENT THERAPY: Oral iron therapy   INTERVAL HISTORY:  [name] 69 y.o. female returns for routine follow-up for microcytic anemia.  Patient reports she is taking her iron tablets as prescribed.  She reports they do not cause any constipation diarrhea or abdominal pains.  She denies any bright red bleeding per rectum or melena. Denies any nausea, vomiting, or diarrhea. Denies any new pains. Had not noticed any recent bleeding such as epistaxis, hematuria or hematochezia. Denies recent chest pain on exertion, shortness of breath on minimal exertion, pre-syncopal episodes, or palpitations. Denies any numbness or tingling in hands or feet. Denies any recent fevers, infections, or recent hospitalizations. Patient reports appetite at 100% and energy level at 100%.  She is eating well maintain her weight at this time.   REVIEW OF SYSTEMS:  Review of Systems  All other systems reviewed and are negative.    PAST MEDICAL/SURGICAL HISTORY:  Past Medical History:  Diagnosis Date  . 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 B12 deficiency 06/16/2015  . Colon polyps   . Diabetes mellitus, type 2 ([place])   . Hyperlipidemia   . Hypertension   . Microcytic anemia 05/18/2015   2013: TCS/EGD 2017: TCS/EGD HYPERPLASTIC GASTRIC POLYPS, LYMPHOCYTIC GASTRITIS   . Obesity    Past Surgical History:  Procedure Laterality Date  . bilateral tubal ligation  1979  . CHOLECYSTECTOMY  2001   ACUTE CHOLECYSTITIS/GALLSTONES  . COLONOSCOPY  05/14/2011   Dr. Oneida Alar: sessile polyp in sigmoid colon, internal hemorrhoids, hyerplastic polyps  . COLONOSCOPY N/A 05/15/2015    Procedure: COLONOSCOPY;  Surgeon: Danie Binder, MD;  Location: [facility];  Service: Endoscopy;  Laterality: N/A;  0830  . COLONOSCOPY W/ POLYPECTOMY  NOV 2008 [facility] ANEMIA   POLYP NO RETRIEVED  . COLONOSCOPY W/ POLYPECTOMY  2006 DR. SMTH   POLYP?  . ESOPHAGOGASTRODUODENOSCOPY  05/14/2011   Dr. Oneida Alar: sessile polyps in the cardia, mild gastritis. Chronic duodenitis consistent with peptic duodenitis, chronic active H.pylori gastritis.   Marland Kitchen ESOPHAGOGASTRODUODENOSCOPY N/A 05/15/2015   Procedure: ESOPHAGOGASTRODUODENOSCOPY (EGD);  Surgeon: Danie Binder, MD;  Location: [facility];  Service: Endoscopy;  Laterality: N/A;  . GIVENS CAPSULE STUDY N/A 06/02/2015   Procedure: GIVENS CAPSULE STUDY;  Surgeon: Danie Binder, MD;  Location: [facility];  Service: Endoscopy;  Laterality: N/A;  0700  . TUBAL LIGATION    . UPPER GASTROINTESTINAL ENDOSCOPY  NOV 2008 [facility] ANEMIA   NL EXAM, urease neg     SOCIAL HISTORY:  Social History   Socioeconomic History  . Marital status: Married    Spouse name: Not on file  . Number of children: Not on file  . Years of education: Not on file  . Highest education level: Not on file  Occupational History  . Not on file  Tobacco Use  . Smoking status: Never Smoker  . Smokeless tobacco: Never Used  Vaping Use  . Vaping Use: Never used  Substance and Sexual Activity  . Alcohol use: No  . Drug use: No  . Sexual activity: Not Currently  Other Topics Concern  . Not on file  Social History Narrative  . Not on  file   Social Determinants of Health   Financial Resource Strain: Low Risk   . Difficulty of Paying Living Expenses: Not hard at all  Food Insecurity: No Food Insecurity  . Worried About Charity fundraiser in the Last Year: Never true  . Ran Out of Food in the Last Year: Never true  Transportation Needs: No Transportation Needs  . Lack of Transportation (Medical): No  . Lack of Transportation (Non-Medical): No  Physical Activity:  Insufficiently Active  . Days of Exercise per Week: 3 days  . Minutes of Exercise per Session: 30 min  Stress: No Stress Concern Present  . Feeling of Stress : Not at all  Social Connections: Moderately Isolated  . Frequency of Communication with Friends and Family: More than three times a week  . Frequency of Social Gatherings with Friends and Family: More than three times a week  . Attends Religious Services: More than 4 times per year  . Active Member of Clubs or Organizations: No  . Attends Archivist Meetings: Never  . Marital Status: Widowed  Intimate Partner Violence: Not At Risk  . Fear of Current or Ex-Partner: No  . Emotionally Abused: No  . Physically Abused: No  . Sexually Abused: No    FAMILY HISTORY:  Family History  Problem Relation Age of Onset  . Heart disease Mother   . Diabetes Mother   . Hypertension Mother   . Stroke Mother   . Breast cancer Mother   . Diabetes Father   . Heart attack Father   . Kidney disease Father   . Hypertension Sister   . Diabetes Sister   . Diabetes Sister   . Mental illness Sister   . Hypertension Sister   . Hepatitis C Sister   . Mental illness Sister   . Diabetes Sister   . Hypertension Sister   . Mental illness Sister   . Colon cancer Sister 19  . Diabetes Brother     CURRENT MEDICATIONS:  Outpatient Encounter Medications as of 03/30/2020  Medication Sig  . Alcohol Swabs (B-D SINGLE USE SWABS REGULAR) PADS USE AS DIRECTED THREE TIMES DAILY  . aspirin 81 MG tablet Take 81 mg by mouth daily.  . Blood Glucose Calibration (TRUE METRIX LEVEL 1) Low SOLN   . Calcium Citrate-Vitamin D (CITRACAL PETITES/VITAMIN D) 200-250 MG-UNIT TABS Take 1 tablet by mouth daily.   . Cholecalciferol (VITAMIN D3) 2000 units TABS Take 1 tablet by mouth daily.  . DROPLET PEN NEEDLES 31G X 8 MM MISC USE 1 PEN ONCE DAILY AS DIRECTED  . Ferrous Sulfate (IRON) 325 (65 FE) MG TABS Take 1 tablet by mouth daily.  Marland Kitchen glipiZIDE (GLUCOTROL) 10  MG tablet TAKE 1 TABLET TWICE DAILY BEFORE MEALS  . LANTUS SOLOSTAR 100 UNIT/ML Solostar Pen INJECT 50 UNITS SUBCUTANEOUSLY ONCE DAILY AT  10  PM  . lisinopril (ZESTRIL) 10 MG tablet TAKE 1 TABLET EVERY DAY  . metFORMIN (GLUCOPHAGE) 1000 MG tablet Take 1 tablet (1,000 mg total) by mouth 2 (two) times daily with a meal.  . Multiple Vitamin (MULTIVITAMINS PO) Take 1 tablet by mouth daily.   . pravastatin (PRAVACHOL) 80 MG tablet TAKE 1 TABLET ONE TIME DAILY IN THE EVENING  . TRUE METRIX BLOOD GLUCOSE TEST test strip TEST THREE TIMES DAILY  . TRUEplus Lancets 33G MISC CHECK BLOOD SUGAR THREE TIMES DAILY   No facility-administered encounter medications on file as of 03/30/2020.    ALLERGIES:  Tetanus toxoid's and  penicillin  PHYSICAL EXAM:  ECOG Performance status: 1  Vitals:   03/30/20 0927  BP: 123/66  Pulse: 88  Resp: 18  Temp: 98.2 F (36.8 C)  SpO2: 98%   Filed Weights   03/30/20 0927  Weight: 241 lb 2.9 oz (109.4 kg)      Physical Exam Constitutional:      Appearance: Normal appearance. She is normal weight.  Cardiovascular:     Rate and Rhythm: Normal rate and regular rhythm.     Heart sounds: Normal heart sounds.  Pulmonary:     Effort: Pulmonary effort is normal.     Breath sounds: Normal breath sounds.  Abdominal:     General: Bowel sounds are normal.     Palpations: Abdomen is soft.  Musculoskeletal:        General: Normal range of motion.  Skin:    General: Skin is warm.  Neurological:     Mental Status: She is alert and oriented to person, place, and time. Mental status is at baseline.  Psychiatric:        Mood and Affect: Mood normal.        Behavior: Behavior normal.        Thought Content: Thought content normal.        Judgment: Judgment normal.      LABORATORY DATA:  I have reviewed the labs as listed.  CBC    Component Value Date/Time   WBC 8.3 03/22/2020 1029   RBC 5.13 (H) 03/22/2020 1029   HGB 11.0 (L) 03/22/2020 1029   HCT 37.1  03/22/2020 1029   PLT 358 03/22/2020 1029   MCV 72.3 (L) 03/22/2020 1029   MCH 21.4 (L) 03/22/2020 1029   MCHC 29.6 (L) 03/22/2020 1029   RDW 16.1 (H) 03/22/2020 1029   LYMPHSABS 1.6 03/22/2020 1029   MONOABS 0.5 03/22/2020 1029   EOSABS 0.2 03/22/2020 1029   BASOSABS 0.1 03/22/2020 1029   CMP Latest Ref Rng & Units 03/22/2020 12/16/2019 09/13/2019  Glucose 70 - 99 mg/dL 218(H) 88 224(H)  BUN 8 - 23 mg/dL 15 12 18   Creatinine 0.44 - 1.00 mg/dL 1.10(H) 1.09(H) 0.97  Sodium 135 - 145 mmol/L 134(L) 139 136  Potassium 3.5 - 5.1 mmol/L 4.3 4.2 4.4  Chloride 98 - 111 mmol/L 100 101 103  CO2 22 - 32 mmol/L 25 22 23   Calcium 8.9 - 10.3 mg/dL 9.2 9.2 9.1  Total Protein 6.5 - 8.1 g/dL 7.3 6.8 7.1  Total Bilirubin 0.3 - 1.2 mg/dL 0.4 0.3 0.5  Alkaline Phos 38 - 126 U/L 104 127(H) 120  AST 15 - 41 U/L 20 19 18   ALT 0 - 44 U/L 21 19 21     All questions were answered to patient's stated satisfaction. Encouraged patient to call with any new concerns or questions before his next visit to the cancer center and we can certain see him sooner, if needed.     ASSESSMENT & PLAN:  1.  Microcytic anemia: - This is been longstanding dating back to 2010. - She last had Feraheme 08/04/2015. - She takes oral iron daily without any side effects. -Labs from 03/22/2020 showed a hemoglobin of 11.0, ferritin of 97, percent saturation 14% and a platelet count of 358,000. -Her hemoglobin is stable between 10 and 11. -No additional IV iron is needed at this time. -Continue with oral iron tablets and repeat labs in approximately 6 months  2.  Vitamin B12 deficiency: - Patient has been getting monthly B12 injections  since February 2017. - Labs on 03/22/2020 showed her vitamin B12 level at greater than 7500. -Hold B12 injections until her next visit.  -Patient can likely transition to oral B12 1 mg tablets daily. -We will recheck labs at that time.    3.  Alpha thalassemia trait: - She had alpha thalassemia  genotype study done on 08/17/2015 -Is also an explanation of her persistent microcytosis. -Labs on 03/22/2020 showed her MCV at 72.3  4.  Health maintenance: -Patient's last mammogram was done on 02/24/2020 It showed B RADS category 1 negative.  She will have a repeat mammogram in October of 2022 - Patient's last colonoscopy was in 05/15/2015 it showed redundant L colon, diverticulosis, small internal hemorrhoids. -Patient's EGD was in 05/15/2015 which showed gastric polyps, GE junction stricture. - Patient had a capsule study 06/02/2015 which showed occasional erosion in the duodenum.  No masses, ulcers or AVMs seen.  Occasional lymphangectasia  Disposition: -RTC in 6 months with repeat labs (CBC, CMP, vitamin B12, folate, ferritin, iron panel, LDH and vitamin D).   No problem-specific Assessment & Plan notes found for this encounter.  Greater than 50% was spent in counseling and coordination of care with this patient including but not limited to discussion of the relevant topics above (See A&P) including, but not limited to diagnosis and management of acute and chronic medical conditions.      Orders placed this encounter:  No orders of the defined types were placed in this encounter.   [name], NP 03/30/2020 9:50 AM  [place] [phone]

## 2020-03-31 ENCOUNTER — Other Ambulatory Visit: Payer: Self-pay | Admitting: Family Medicine

## 2020-04-24 ENCOUNTER — Other Ambulatory Visit: Payer: Self-pay | Admitting: Family Medicine

## 2020-04-30 ENCOUNTER — Other Ambulatory Visit: Payer: Self-pay | Admitting: Family Medicine

## 2020-05-04 ENCOUNTER — Ambulatory Visit (INDEPENDENT_AMBULATORY_CARE_PROVIDER_SITE_OTHER): Payer: Medicare HMO | Admitting: Family Medicine

## 2020-05-04 ENCOUNTER — Other Ambulatory Visit: Payer: Self-pay

## 2020-05-04 ENCOUNTER — Encounter: Payer: Self-pay | Admitting: Family Medicine

## 2020-05-04 VITALS — BP 146/87 | HR 85 | Resp 15 | Ht 69.0 in | Wt 238.1 lb

## 2020-05-04 DIAGNOSIS — E785 Hyperlipidemia, unspecified: Secondary | ICD-10-CM | POA: Diagnosis not present

## 2020-05-04 DIAGNOSIS — I10 Essential (primary) hypertension: Secondary | ICD-10-CM | POA: Diagnosis not present

## 2020-05-04 DIAGNOSIS — Z1211 Encounter for screening for malignant neoplasm of colon: Secondary | ICD-10-CM

## 2020-05-04 DIAGNOSIS — E1159 Type 2 diabetes mellitus with other circulatory complications: Secondary | ICD-10-CM

## 2020-05-04 LAB — POCT GLYCOSYLATED HEMOGLOBIN (HGB A1C): HbA1c, POC (controlled diabetic range): 7.4 % — AB (ref 0.0–7.0)

## 2020-05-04 MED ORDER — LISINOPRIL-HYDROCHLOROTHIAZIDE 20-12.5 MG PO TABS: 1.0000 | ORAL_TABLET | Freq: Every day | ORAL | 1 refills | Status: DC

## 2020-05-04 NOTE — Patient Instructions (Addendum)
F/U in office with mD re evaluate blood pressure in 2 months, call if you need me  sooner  New for blood pressure is lisinopril/ hctz 20/12.5 ONE daily, stop lisinopril 10 mg ta blet once you get this  You are referred for screening colonoscopy  We will get eye exam from [facility] in [place] done in 02/2020  Microalb today  Good blood sugar control  Non fasting chem 7 and eGFR 5 days before next visit  Think about what you will eat, plan ahead. Choose " clean, green, fresh or frozen" over canned, processed or packaged foods which are more sugary, salty and fatty. 70 to 75% of food eaten should be vegetables and fruit. Three meals at set times with snacks allowed between meals, but they must be fruit or vegetables. Aim to eat over a 12 hour period , example 7 am to 7 pm, and STOP after  your last meal of the day. Drink water,generally about 64 ounces per day, no other drink is as healthy. Fruit juice is best enjoyed in a healthy way, by EATING the fruit. It is important that you exercise regularly at least 30 minutes 5 times a week. If you develop chest pain, have severe difficulty breathing, or feel very tired, stop exercising immediately and seek medical attention  Thanks for choosing [facility], we consider it a privelige to serve you.

## 2020-05-06 ENCOUNTER — Encounter: Payer: Self-pay | Admitting: Family Medicine

## 2020-05-06 LAB — MICROALBUMIN / CREATININE URINE RATIO
Creatinine, Urine: 163.3 mg/dL
Microalb/Creat Ratio: 14 mg/g creat (ref 0–29)
Microalbumin, Urine: 22.2 ug/mL

## 2020-05-06 NOTE — Assessment & Plan Note (Signed)
Hyperlipidemia:Low fat diet discussed and encouraged.   Lipid Panel  Lab Results  Component Value Date   CHOL 154 12/16/2019   HDL 55 12/16/2019   LDLCALC 84 12/16/2019   TRIG 76 12/16/2019   CHOLHDL 2.8 12/16/2019   Controlled, no change in medication Updated lab needed at/ before next visit.

## 2020-05-06 NOTE — Progress Notes (Signed)
Helen Mahoney     MRN: [id number]      DOB: [birth year]   HPI Helen Mahoney is here for follow up and re-evaluation of chronic medical conditions, medication management and review of any available recent lab and radiology data.  Preventive health is updated, specifically  Cancer screening and Immunization.   Questions or concerns regarding consultations or procedures which the PT has had in the interim are  addressed. The PT denies any adverse reactions to current medications since the last visit.  There are no new concerns.  There are no specific complaints  Denies polyuria, polydipsia, blurred vision , or hypoglycemic episodes.   ROS Denies recent fever or chills. Denies sinus pressure, nasal congestion, ear pain or sore throat. Denies chest congestion, productive cough or wheezing. Denies chest pains, palpitations and leg swelling Denies abdominal pain, nausea, vomiting,diarrhea or constipation.   Denies dysuria, frequency, hesitancy or incontinence. Denies joint pain, swelling and limitation in mobility. Denies headaches, seizures, numbness, or tingling. Denies depression, anxiety or insomnia. Denies skin break down or rash.   PE  BP (!) 146/87   Pulse 85   Resp 15   Ht 5\' 9"  (1.753 m)   Wt 238 lb 1.9 oz (108 kg)   SpO2 95%   BMI 35.16 kg/m   Patient alert and oriented and in no cardiopulmonary distress.  HEENT: No facial asymmetry, EOMI,     Neck supple .  Chest: Clear to auscultation bilaterally.  CVS: S1, S2 no murmurs, no S3.Regular rate.  ABD: Soft non tender.   Ext: No edema  MS: Adequate ROM spine, shoulders, hips and knees.  Skin: Intact, no ulcerations or rash noted.  Psych: Good eye contact, normal affect. Memory intact not anxious or depressed appearing.  CNS: CN 2-12 intact, power,  normal throughout.no focal deficits noted.   Assessment & Plan  Hyperlipidemia LDL goal <100 Hyperlipidemia:Low fat diet discussed and  encouraged.   Lipid Panel  Lab Results  Component Value Date   CHOL 154 12/16/2019   HDL 55 12/16/2019   LDLCALC 84 12/16/2019   TRIG 76 12/16/2019   CHOLHDL 2.8 12/16/2019   Controlled, no change in medication Updated lab needed at/ before next visit.     Morbid obesity  Patient re-educated about  the importance of commitment to a  minimum of 150 minutes of exercise per week as able.  The importance of healthy food choices with portion control discussed, as well as eating regularly and within a 12 hour window most days. The need to choose "clean , green" food 50 to 75% of the time is discussed, as well as to make water the primary drink and set a goal of 64 ounces water daily.    Weight /BMI 05/04/2020 03/30/2020 12/28/2019  WEIGHT 238 lb 1.9 oz 241 lb 2.9 oz 238 lb  HEIGHT 5\' 9"  - 5\' 9"   BMI 35.16 kg/m2 35.62 kg/m2 35.15 kg/m2  obesity linked with hTN and diabetes    Essential hypertension Uncontrolled, med chage and re eval in 10 weeks DASH diet and commitment to daily physical activity for a minimum of 30 minutes discussed and encouraged, as a part of hypertension management. The importance of attaining a healthy weight is also discussed.  BP/Weight 05/04/2020 03/30/2020 03/22/2020 02/23/2020 01/24/2020 12/28/2019 2/54/2706  Systolic BP 237 628 315 176 160 737 106  Diastolic BP 87 66 66 69 69 74 74  Wt. (Lbs) 238.12 241.18 - - - 238 -  BMI 35.16 35.62 - - -  35.15 -       Type 2 diabetes mellitus with vascular disease ([place]) Helen Mahoney is reminded of the importance of commitment to daily physical activity for 30 minutes or more, as able and the need to limit carbohydrate intake to 30 to 60 grams per meal to help with blood sugar control.   The need to take medication as prescribed, test blood sugar as directed, and to call between visits if there is a concern that blood sugar is uncontrolled is also discussed.   Helen Mahoney is reminded of the importance of daily foot  exam, annual eye examination, and good blood sugar, blood pressure and cholesterol control. Controlled, no change in medication  Diabetic Labs Latest Ref Rng & Units 05/04/2020 03/22/2020 12/16/2019 09/13/2019 03/30/2019  HbA1c 0.0 - 7.0 % 7.4(A) - - - 6.5(H)  Microalbumin mg/dL - - - - 0.9  Micro/Creat Ratio 0 - 29 mg/g creat 14 - - - -  Chol 100 - 199 mg/dL - - 154 - 174  HDL >39 mg/dL - - 55 - 52  Calc LDL 0 - 99 mg/dL - - 84 - 106(H)  Triglycerides 0 - 149 mg/dL - - 76 - 71  Creatinine 0.44 - 1.00 mg/dL - 1.10(H) 1.09(H) 0.97 0.90   BP/Weight 05/04/2020 03/30/2020 03/22/2020 02/23/2020 01/24/2020 12/28/2019 12/28/5174  Systolic BP 160 737 106 269 485 462 703  Diastolic BP 87 66 66 69 69 74 74  Wt. (Lbs) 238.12 241.18 - - - 238 -  BMI 35.16 35.62 - - - 35.15 -   Foot/eye exam completion dates Latest Ref Rng & Units 12/16/2019 03/22/2019  Eye Exam No Retinopathy - No Retinopathy  Foot exam Order - - -  Foot Form Completion - Done -

## 2020-05-06 NOTE — Assessment & Plan Note (Signed)
[name] is reminded of the importance of commitment to daily physical activity for 30 minutes or more, as able and the need to limit carbohydrate intake to 30 to 60 grams per meal to help with blood sugar control.   The need to take medication as prescribed, test blood sugar as directed, and to call between visits if there is a concern that blood sugar is uncontrolled is also discussed.   [name] is reminded of the importance of daily foot exam, annual eye examination, and good blood sugar, blood pressure and cholesterol control. Controlled, no change in medication  Diabetic Labs Latest Ref Rng & Units 05/04/2020 03/22/2020 12/16/2019 09/13/2019 03/30/2019  HbA1c 0.0 - 7.0 % 7.4(A) - - - 6.5(H)  Microalbumin mg/dL - - - - 0.9  Micro/Creat Ratio 0 - 29 mg/g creat 14 - - - -  Chol 100 - 199 mg/dL - - 154 - 174  HDL >39 mg/dL - - 55 - 52  Calc LDL 0 - 99 mg/dL - - 84 - 106(H)  Triglycerides 0 - 149 mg/dL - - 76 - 71  Creatinine 0.44 - 1.00 mg/dL - 1.10(H) 1.09(H) 0.97 0.90   BP/Weight 05/04/2020 03/30/2020 03/22/2020 02/23/2020 01/24/2020 12/28/2019 07/12/1759  Systolic BP 607 371 062 694 854 627 035  Diastolic BP 87 66 66 69 69 74 74  Wt. (Lbs) 238.12 241.18 - - - 238 -  BMI 35.16 35.62 - - - 35.15 -   Foot/eye exam completion dates Latest Ref Rng & Units 12/16/2019 03/22/2019  Eye Exam No Retinopathy - No Retinopathy  Foot exam Order - - -  Foot Form Completion - Done -

## 2020-05-06 NOTE — Assessment & Plan Note (Signed)
  Patient re-educated about  the importance of commitment to a  minimum of 150 minutes of exercise per week as able.  The importance of healthy food choices with portion control discussed, as well as eating regularly and within a 12 hour window most days. The need to choose "clean , green" food 50 to 75% of the time is discussed, as well as to make water the primary drink and set a goal of 64 ounces water daily.    Weight /BMI 05/04/2020 03/30/2020 12/28/2019  WEIGHT 238 lb 1.9 oz 241 lb 2.9 oz 238 lb  HEIGHT 5\' 9"  - 5\' 9"   BMI 35.16 kg/m2 35.62 kg/m2 35.15 kg/m2  obesity linked with hTN and diabetes

## 2020-05-06 NOTE — Assessment & Plan Note (Signed)
Uncontrolled, med chage and re eval in 10 weeks DASH diet and commitment to daily physical activity for a minimum of 30 minutes discussed and encouraged, as a part of hypertension management. The importance of attaining a healthy weight is also discussed.  BP/Weight 05/04/2020 03/30/2020 03/22/2020 02/23/2020 01/24/2020 12/28/2019 10/27/1599  Systolic BP 093 235 573 220 254 270 623  Diastolic BP 87 66 66 69 69 74 74  Wt. (Lbs) 238.12 241.18 - - - 238 -  BMI 35.16 35.62 - - - 35.15 -

## 2020-05-08 ENCOUNTER — Encounter: Payer: Self-pay | Admitting: Internal Medicine

## 2020-05-22 ENCOUNTER — Other Ambulatory Visit: Payer: Self-pay | Admitting: Family Medicine

## 2020-05-27 ENCOUNTER — Other Ambulatory Visit: Payer: Self-pay | Admitting: Family Medicine

## 2020-05-31 ENCOUNTER — Ambulatory Visit (INDEPENDENT_AMBULATORY_CARE_PROVIDER_SITE_OTHER): Payer: Self-pay | Admitting: *Deleted

## 2020-05-31 ENCOUNTER — Other Ambulatory Visit: Payer: Self-pay

## 2020-05-31 VITALS — Ht 67.0 in | Wt 241.6 lb

## 2020-05-31 DIAGNOSIS — Z1211 Encounter for screening for malignant neoplasm of colon: Secondary | ICD-10-CM

## 2020-05-31 MED ORDER — NA SULFATE-K SULFATE-MG SULF 17.5-3.13-1.6 GM/177ML PO SOLN: 1.0000 | Freq: Once | ORAL | 0 refills | Status: AC

## 2020-05-31 NOTE — Progress Notes (Addendum)
Gastroenterology Pre-Procedure Review  Request Date: 05/31/2020 Requesting Physician:  Dr. Moshe Cipro, Last TCS 04/2015 done by Dr. Oneida Alar, hyperplastic polyps, family hx of colon cancer (sister)    PATIENT REVIEW QUESTIONS: The patient responded to the following health history questions as indicated:    1. Diabetes Melitis: yes, type II 2. Joint replacements in the past 12 months: no 3. Major health problems in the past 3 months: no 4. Has an artificial valve or MVP: no 5. Has a defibrillator: no 6. Has been advised in past to take antibiotics in advance of a procedure like teeth cleaning: no 7. Family history of colon cancer: yes, sister: age 29's  8. Alcohol Use: no 9. Illicit drug Use: no 10. History of sleep apnea: no  11. History of coronary artery or other vascular stents placed within the last 12 months: no 12. History of any prior anesthesia complications: no 13. Body mass index is 37.84 kg/m.    MEDICATIONS & ALLERGIES:    Patient reports the following regarding taking any blood thinners:   Plavix? no Aspirin? yes Coumadin? no Brilinta? no Xarelto? no Eliquis? no Pradaxa? no Savaysa? no Effient? no  Patient confirms/reports the following medications:  Current Outpatient Medications  Medication Sig Dispense Refill  . Alcohol Swabs (B-D SINGLE USE SWABS REGULAR) PADS USE AS DIRECTED THREE TIMES DAILY 100 each 0  . aspirin 81 MG tablet Take 81 mg by mouth daily.    . Blood Glucose Calibration (TRUE METRIX LEVEL 1) Low SOLN     . Calcium Citrate-Vitamin D 200-250 MG-UNIT TABS Take 1 tablet by mouth daily.    . Cholecalciferol (VITAMIN D3) 2000 units TABS Take 1 tablet by mouth daily.    . DROPLET PEN NEEDLES 31G X 8 MM MISC USE 1 PEN ONCE DAILY AS DIRECTED 100 each 5  . glipiZIDE (GLUCOTROL) 10 MG tablet TAKE 1 TABLET TWICE DAILY BEFORE A MEAL 180 tablet 1  . glucose blood (TRUE METRIX BLOOD GLUCOSE TEST) test strip TEST BLOOD SUGAR THREE TIMES DAILY 300 strip 1  .  LANTUS SOLOSTAR 100 UNIT/ML Solostar Pen INJECT 50 UNITS SUBCUTANEOUSLY ONCE DAILY AT  10:00  PM 15 mL 0  . lisinopril-hydrochlorothiazide (ZESTORETIC) 20-12.5 MG tablet Take 1 tablet by mouth daily. 90 tablet 1  . metFORMIN (GLUCOPHAGE) 1000 MG tablet TAKE 1 TABLET TWICE DAILY WITH MEALS 180 tablet 0  . Multiple Vitamin (MULTIVITAMINS PO) Take 1 tablet by mouth daily.    . Na Sulfate-K Sulfate-Mg Sulf 17.5-3.13-1.6 GM/177ML SOLN Take 1 kit by mouth once for 1 dose. 354 mL 0  . pravastatin (PRAVACHOL) 80 MG tablet TAKE 1 TABLET ONE TIME DAILY IN THE EVENING 90 tablet 0  . TRUEplus Lancets 33G MISC CHECK BLOOD SUGAR THREE TIMES DAILY 100 each 0   No current facility-administered medications for this visit.    Patient confirms/reports the following allergies:  Allergies  Allergen Reactions  . Tetanus Toxoids     Can't walk the next day(happened twice)   . Penicillins Rash    Has patient had a PCN reaction causing immediate rash, facial/tongue/throat swelling, SOB or lightheadedness with hypotension: Yes/No:[id number] Has patient had a PCN reaction causing severe rash involving mucus membranes or skin necrosis: Yes/No:[id number] rash Has patient had a PCN reaction that required hospitalization Yes/No:[id number] Has patient had a PCN reaction occurring within the last 10 years: Yes/No:[id number] If all of the above answers are NO, then may proceed with Cephalos    No orders of the defined types  were placed in this encounter.   AUTHORIZATION INFORMATION Primary Insurance: [facility],  ID #: [id number],  Group #: [id number] Pre-Cert / Josem Kaufmann required: Yes, approved per Pablo Lawrence [id number] Pre-Cert / Auth #: [id number]  SCHEDULE INFORMATION: Procedure has been scheduled as follows:  Date: 08/07/2020 , Time: AM procedure Location: [facility] with Dr. Abbey Chatters  This Gastroenterology Pre-Precedure Review Form is being routed to the following provider(s): Walden Field, NP

## 2020-05-31 NOTE — Patient Instructions (Addendum)
Helen Mahoney  [birth year] MRN: [id number]     Procedure Date:  08/07/2020  Monday Time to register: You will receive a call from the hospital a few days before your procedure. Place to register: [facility] Scheduled provider: Dr. Abbey Chatters    PREPARATION FOR COLONOSCOPY WITH SUPREP BOWEL PREP KIT  Note: Suprep Bowel Prep Kit is a split-dose (2day) regimen. Consumption of BOTH 6-ounce bottles is required for a complete prep.  Please notify us immediately if you are diabetic, take iron supplements, or if you are on Coumadin or any other blood thinners.  Please hold the following medications: See letter  DM meds: Glipizide, Lantus, : Half the night before procedure and none the morning of procedure.  Metformin: none the morning of procedure.                                                                                                                                                 2 DAYS BEFORE PROCEDURE:  DATE: 08/05/2020  DAY: Saturday Begin clear liquid diet AFTER your lunch meal. NO SOLID FOODS after this point.  1 DAY BEFORE PROCEDURE:  DATE: 08/06/2020   DAY: Sunday Continue clear liquids the entire day - NO SOLID FOOD.   Diabetic medications adjustments for today: See letter   At 8:00am: Complete steps 1 through 4 below, using ONE (1) 6-ounce bottle, before going to bed. Step 1:  Pour ONE (1) 6-ounce bottle of SUPREP liquid into the mixing container.  Step 2:  Add cool drinking water to the 16 ounce line on the container and mix.  Note: Dilute the solution concentrate as directed prior to use. Step 3:  DRINK ALL the liquid in the container. Step 4:  You MUST drink an additional two (2) or more 16 ounce containers of water over the next one (1) hour.    At 6:00pm: Complete steps 1 through 4 below, using ONE (1) 6-ounce bottle, before going to bed. Step 1:  Pour ONE (1) 6-ounce bottle of SUPREP liquid into the mixing container.  Step 2:  Add cool drinking water to  the 16 ounce line on the container and mix.  Note: Dilute the solution concentrate as directed prior to use. Step 3:  DRINK ALL the liquid in the container. Step 4:  You MUST drink an additional two (2) or more 16 ounce containers of water over the next one (1) hour.   Continue clear liquids until midnight.  Nothing by mouth after midnight.  DAY OF PROCEDURE:   DATE: 08/07/2020   DAY: Monday If you take medications for your heart, blood pressure, or breathing, you may take these medications.  Diabetic medications adjustments for today: See letter.  Please see below for Dietary Information.  CLEAR LIQUIDS INCLUDE:  Water Jello (NOT red in color)   Ice Popsicles (NOT red in  color)   Tea (sugar ok, no milk/cream) Powdered fruit flavored drinks  Coffee (sugar ok, no milk/cream) Gatorade/ Lemonade/ Kool-Aid  (NOT red in color)   Juice: apple, white grape, white cranberry Soft drinks  Clear bullion, consomme, broth (fat free beef/chicken/vegetable)  Carbonated beverages (any kind)  Strained chicken noodle soup Hard Candy   Remember: Clear liquids are liquids that will allow you to see your fingers on the other side of a clear glass. Be sure liquids are NOT red in color, and not cloudy, but CLEAR.  DO NOT EAT OR DRINK ANY OF THE FOLLOWING:  Dairy products of any kind   Cranberry juice Tomato juice / V8 juice   Grapefruit juice Orange juice     Red grape juice  Do not eat any solid foods, including such foods as: cereal, oatmeal, yogurt, fruits, vegetables, creamed soups, eggs, bread, crackers, pureed foods in a blender, etc.   HELPFUL HINTS FOR DRINKING PREP SOLUTION:   Make sure prep is extremely cold. Mix and refrigerate the the morning of the prep. You may also put in the freezer.   You may try mixing some Crystal Light or Country Time Lemonade if you prefer. Mix in small amounts; add more if necessary.  Try drinking through a straw  Rinse mouth with water or a mouthwash between  glasses, to remove after-taste.  Try sipping on a cold beverage /ice/ popsicles between glasses of prep.  Place a piece of sugar-free hard candy in mouth between glasses.  If you become nauseated, try consuming smaller amounts, or stretch out the time between glasses. Stop for 30-60 minutes, then slowly start back drinking.     OTHER INSTRUCTIONS  You will need a responsible adult at least 70 years of age to accompany you and drive you home. This person must remain in the waiting room during your procedure. The hospital will cancel your procedure if you do not have a responsible adult with you.   1. Wear loose fitting clothing that is easily removed. 2. Leave jewelry and other valuables at home.  3. Remove all body piercing jewelry and leave at home. 4. Total time from sign-in until discharge is approximately 2-3 hours. 5. You should go home directly after your procedure and rest. You can resume normal activities the day after your procedure. 6. The day of your procedure you should not:  Drive  Make legal decisions  Operate machinery  Drink alcohol  Return to work   You may call the office (Dept: [phone]) before 5:00pm, or page the doctor on call [phone]) after 5:00pm, for further instructions, if necessary.   Insurance Information YOU WILL NEED TO CHECK WITH YOUR INSURANCE COMPANY FOR THE BENEFITS OF COVERAGE YOU HAVE FOR THIS PROCEDURE.  UNFORTUNATELY, NOT ALL INSURANCE COMPANIES HAVE BENEFITS TO COVER ALL OR PART OF THESE TYPES OF PROCEDURES.  IT IS YOUR RESPONSIBILITY TO CHECK YOUR BENEFITS, HOWEVER, WE WILL BE GLAD TO ASSIST YOU WITH ANY CODES YOUR INSURANCE COMPANY MAY NEED.    PLEASE NOTE THAT MOST INSURANCE COMPANIES WILL NOT COVER A SCREENING COLONOSCOPY FOR PEOPLE UNDER THE AGE OF 50  IF YOU HAVE BCBS INSURANCE, YOU MAY HAVE BENEFITS FOR A SCREENING COLONOSCOPY BUT IF POLYPS ARE FOUND THE DIAGNOSIS WILL CHANGE AND THEN YOU MAY HAVE A DEDUCTIBLE THAT WILL  NEED TO BE MET. SO PLEASE MAKE SURE YOU CHECK YOUR BENEFITS FOR A SCREENING COLONOSCOPY AS WELL AS A DIAGNOSTIC COLONOSCOPY.

## 2020-06-03 ENCOUNTER — Other Ambulatory Visit: Payer: Self-pay | Admitting: Family Medicine

## 2020-06-20 NOTE — Progress Notes (Signed)
Ok to schedule.  DM meds: Glipizide, Lantus, : Half the night before and none the morning of.  Metformin: none the morning of   On prep day: Check CBG ac and hs as well (if they normally check their blood sugar) as if the patient feels like their blood sugar is off. Can use soda, juice (that's in [place]) as needed for any low blood sugar.  Check CBG on arrival to endo unit.  ASA II

## 2020-06-21 ENCOUNTER — Encounter: Payer: Self-pay | Admitting: *Deleted

## 2020-06-21 NOTE — Progress Notes (Signed)
Mailed letter to pt with diabetes medication adjustments.   

## 2020-06-22 ENCOUNTER — Other Ambulatory Visit: Payer: Self-pay | Admitting: *Deleted

## 2020-07-05 ENCOUNTER — Other Ambulatory Visit: Payer: Self-pay | Admitting: Family Medicine

## 2020-07-07 DIAGNOSIS — E1159 Type 2 diabetes mellitus with other circulatory complications: Secondary | ICD-10-CM | POA: Diagnosis not present

## 2020-07-08 LAB — BMP8+EGFR
BUN/Creatinine Ratio: 14 (ref 12–28)
BUN: 15 mg/dL (ref 8–27)
CO2: 21 mmol/L (ref 20–29)
Calcium: 9.5 mg/dL (ref 8.7–10.3)
Chloride: 104 mmol/L (ref 96–106)
Creatinine, Ser: 1.07 mg/dL — ABNORMAL HIGH (ref 0.57–1.00)
Glucose: 67 mg/dL (ref 65–99)
Potassium: 4.3 mmol/L (ref 3.5–5.2)
Sodium: 141 mmol/L (ref 134–144)
eGFR: 56 mL/min/{1.73_m2} — ABNORMAL LOW (ref >59)

## 2020-07-10 ENCOUNTER — Other Ambulatory Visit: Payer: Self-pay | Admitting: Family Medicine

## 2020-07-12 ENCOUNTER — Ambulatory Visit (INDEPENDENT_AMBULATORY_CARE_PROVIDER_SITE_OTHER): Payer: Medicare HMO | Admitting: Nurse Practitioner

## 2020-07-12 ENCOUNTER — Ambulatory Visit: Payer: Medicare HMO | Admitting: Family Medicine

## 2020-07-12 ENCOUNTER — Other Ambulatory Visit: Payer: Self-pay

## 2020-07-12 ENCOUNTER — Encounter: Payer: Self-pay | Admitting: Nurse Practitioner

## 2020-07-12 VITALS — BP 144/80 | HR 100 | Temp 98.9°F | Resp 20 | Ht 67.0 in | Wt 238.0 lb

## 2020-07-12 DIAGNOSIS — I10 Essential (primary) hypertension: Secondary | ICD-10-CM | POA: Diagnosis not present

## 2020-07-12 DIAGNOSIS — E1159 Type 2 diabetes mellitus with other circulatory complications: Secondary | ICD-10-CM

## 2020-07-12 DIAGNOSIS — E785 Hyperlipidemia, unspecified: Secondary | ICD-10-CM | POA: Diagnosis not present

## 2020-07-12 MED ORDER — LISINOPRIL-HYDROCHLOROTHIAZIDE 20-12.5 MG PO TABS
ORAL_TABLET | ORAL | 1 refills | Status: DC
Start: 2020-07-12 — End: 2020-07-13

## 2020-07-12 NOTE — Patient Instructions (Signed)
Please have fasting labs drawn 2-3 days prior to your next appointment. 

## 2020-07-12 NOTE — Assessment & Plan Note (Signed)
-  INCREASE lisinopril-HCTZ to 20-12.5 x 1 tab in Am and 0.5 tab qPM

## 2020-07-12 NOTE — Progress Notes (Signed)
Acute Office Visit  Subjective:    Patient ID: Helen Mahoney, female    DOB: [birth year], 70 y.o.   MRN: [id number]  Chief Complaint  Patient presents with  . Hypertension    Follow up since medication change.     HPI Patient is in today for BP check. Denies adverse medication effects. Endorses taking her meds as prescribed.  BP Readings from Last 3 Encounters:  07/12/20 (!) 144/80  05/04/20 (!) 146/87  03/30/20 123/66   At her last OV, her BP was slightly elevated, but no med changes were made. Today her BP is still elevated. Taking zestoretic 20/12.5 daily.   Past Medical History:  Diagnosis Date  . 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 B12 deficiency 06/16/2015  . Colon polyps   . Diabetes mellitus, type 2 ([place])   . Hyperlipidemia   . Hypertension   . Microcytic anemia 05/18/2015   2013: TCS/EGD 2017: TCS/EGD HYPERPLASTIC GASTRIC POLYPS, LYMPHOCYTIC GASTRITIS   . Obesity     Past Surgical History:  Procedure Laterality Date  . bilateral tubal ligation  1979  . CHOLECYSTECTOMY  2001   ACUTE CHOLECYSTITIS/GALLSTONES  . COLONOSCOPY  05/14/2011   Dr. Oneida Alar: sessile polyp in sigmoid colon, internal hemorrhoids, hyerplastic polyps  . COLONOSCOPY N/A 05/15/2015   Procedure: COLONOSCOPY;  Surgeon: Danie Binder, MD;  Location: [facility];  Service: Endoscopy;  Laterality: N/A;  0830  . COLONOSCOPY W/ POLYPECTOMY  NOV 2008 [facility] ANEMIA   POLYP NO RETRIEVED  . COLONOSCOPY W/ POLYPECTOMY  2006 DR. SMTH   POLYP?  . ESOPHAGOGASTRODUODENOSCOPY  05/14/2011   Dr. Oneida Alar: sessile polyps in the cardia, mild gastritis. Chronic duodenitis consistent with peptic duodenitis, chronic active H.pylori gastritis.   Marland Kitchen ESOPHAGOGASTRODUODENOSCOPY N/A 05/15/2015   Procedure: ESOPHAGOGASTRODUODENOSCOPY (EGD);  Surgeon: Danie Binder, MD;  Location: [facility];  Service: Endoscopy;   Laterality: N/A;  . GIVENS CAPSULE STUDY N/A 06/02/2015   Procedure: GIVENS CAPSULE STUDY;  Surgeon: Danie Binder, MD;  Location: [facility];  Service: Endoscopy;  Laterality: N/A;  0700  . TUBAL LIGATION    . UPPER GASTROINTESTINAL ENDOSCOPY  NOV 2008 [facility] ANEMIA   NL EXAM, urease neg    Family History  Problem Relation Age of Onset  . Heart disease Mother   . Diabetes Mother   . Hypertension Mother   . Stroke Mother   . Breast cancer Mother   . Diabetes Father   . Heart attack Father   . Kidney disease Father   . Hypertension Sister   . Diabetes Sister   . Diabetes Sister   . Mental illness Sister   . Hypertension Sister   . Hepatitis C Sister   . Mental illness Sister   . Diabetes Sister   . Hypertension Sister   . Mental illness Sister   . Colon cancer Sister 76  . Diabetes Brother     Social History   Socioeconomic History  . Marital status: Married    Spouse name: Not on file  . Number of children: Not on file  . Years of education: Not on file  . Highest education level: Not on file  Occupational History  . Not on file  Tobacco Use  . Smoking status: Never Smoker  . Smokeless tobacco: Never Used  Vaping Use  . Vaping Use: Never used  Substance and Sexual Activity  . Alcohol use: No  .  Drug use: No  . Sexual activity: Not Currently  Other Topics Concern  . Not on file  Social History Narrative  . Not on file   Social Determinants of Health   Financial Resource Strain: Low Risk   . Difficulty of Paying Living Expenses: Not hard at all  Food Insecurity: No Food Insecurity  . Worried About Charity fundraiser in the Last Year: Never true  . Ran Out of Food in the Last Year: Never true  Transportation Needs: No Transportation Needs  . Lack of Transportation (Medical): No  . Lack of Transportation (Non-Medical): No  Physical Activity: Insufficiently Active  . Days of Exercise per Week: 3 days  . Minutes of Exercise per Session: 30 min  Stress: No  Stress Concern Present  . Feeling of Stress : Not at all  Social Connections: Moderately Isolated  . Frequency of Communication with Friends and Family: More than three times a week  . Frequency of Social Gatherings with Friends and Family: More than three times a week  . Attends Religious Services: More than 4 times per year  . Active Member of Clubs or Organizations: No  . Attends Archivist Meetings: Never  . Marital Status: Widowed  Intimate Partner Violence: Not At Risk  . Fear of Current or Ex-Partner: No  . Emotionally Abused: No  . Physically Abused: No  . Sexually Abused: No    Outpatient Medications Prior to Visit  Medication Sig Dispense Refill  . Alcohol Swabs (B-D SINGLE USE SWABS REGULAR) PADS USE AS DIRECTED THREE TIMES DAILY 100 each 0  . aspirin 81 MG tablet Take 81 mg by mouth daily.    . Blood Glucose Calibration (TRUE METRIX LEVEL 1) Low SOLN     . Calcium Citrate-Vitamin D 200-250 MG-UNIT TABS Take 1 tablet by mouth daily.    . Cholecalciferol (VITAMIN D3) 2000 units TABS Take 1 tablet by mouth daily.    . DROPLET PEN NEEDLES 31G X 8 MM MISC USE 1 PEN ONCE DAILY AS DIRECTED 100 each 5  . glipiZIDE (GLUCOTROL) 10 MG tablet TAKE 1 TABLET TWICE DAILY BEFORE A MEAL 180 tablet 1  . glucose blood (TRUE METRIX BLOOD GLUCOSE TEST) test strip TEST BLOOD SUGAR THREE TIMES DAILY 300 strip 1  . LANTUS SOLOSTAR 100 UNIT/ML Solostar Pen INJECT 50 UNITS SUBCUTANEOUSLY ONCE DAILY AT  10  PM 15 mL 0  . metFORMIN (GLUCOPHAGE) 1000 MG tablet TAKE 1 TABLET TWICE DAILY WITH MEALS 180 tablet 0  . Multiple Vitamin (MULTIVITAMINS PO) Take 1 tablet by mouth daily.    . pravastatin (PRAVACHOL) 80 MG tablet TAKE 1 TABLET ONE TIME DAILY IN THE EVENING 90 tablet 0  . TRUEplus Lancets 33G MISC CHECK BLOOD SUGAR THREE TIMES DAILY 100 each 0  . lisinopril-hydrochlorothiazide (ZESTORETIC) 20-12.5 MG tablet Take 1 tablet by mouth daily. 90 tablet 1   No facility-administered medications  prior to visit.    Allergies  Allergen Reactions  . Tetanus Toxoids     Can't walk the next day(happened twice)   . Penicillins Rash    Has patient had a PCN reaction causing immediate rash, facial/tongue/throat swelling, SOB or lightheadedness with hypotension: Yes/No:[id number] Has patient had a PCN reaction causing severe rash involving mucus membranes or skin necrosis: Yes/No:[id number] rash Has patient had a PCN reaction that required hospitalization Yes/No:[id number] Has patient had a PCN reaction occurring within the last 10 years: Yes/No:[id number] If all of the above answers are NO, then may  proceed with Cephalos    Review of Systems  Constitutional: Negative.   Respiratory: Negative.   Cardiovascular: Negative.   Musculoskeletal: Negative.   Psychiatric/Behavioral: Negative.        Objective:    Physical Exam Constitutional:      Appearance: Normal appearance.  Cardiovascular:     Rate and Rhythm: Regular rhythm.     Pulses: Normal pulses.     Heart sounds: Normal heart sounds.  Pulmonary:     Effort: Pulmonary effort is normal.     Breath sounds: Normal breath sounds.  Neurological:     Mental Status: She is alert.  Psychiatric:        Mood and Affect: Mood normal.        Behavior: Behavior normal.        Thought Content: Thought content normal.        Judgment: Judgment normal.     BP (!) 144/80   Pulse 100   Temp 98.9 F (37.2 C)   Resp 20   Ht '5\' 7"'  (1.702 m)   Wt 238 lb (108 kg)   SpO2 95%   BMI 37.28 kg/m  Wt Readings from Last 3 Encounters:  07/12/20 238 lb (108 kg)  05/31/20 241 lb 9.6 oz (109.6 kg)  05/04/20 238 lb 1.9 oz (108 kg)    Health Maintenance Due  Topic Date Due  . OPHTHALMOLOGY EXAM  03/21/2020  . COLONOSCOPY (Pts 45-76yr Insurance coverage will need to be confirmed)  05/14/2020    There are no preventive care reminders to display for this patient.   Lab Results  Component Value Date   TSH 1.790 12/16/2019    Lab Results  Component Value Date   WBC 8.3 03/22/2020   HGB 11.0 (L) 03/22/2020   HCT 37.1 03/22/2020   MCV 72.3 (L) 03/22/2020   PLT 358 03/22/2020   Lab Results  Component Value Date   NA 141 07/07/2020   K 4.3 07/07/2020   CO2 21 07/07/2020   GLUCOSE 67 07/07/2020   BUN 15 07/07/2020   CREATININE 1.07 (H) 07/07/2020   BILITOT 0.4 03/22/2020   ALKPHOS 104 03/22/2020   AST 20 03/22/2020   ALT 21 03/22/2020   PROT 7.3 03/22/2020   ALBUMIN 3.4 (L) 03/22/2020   CALCIUM 9.5 07/07/2020   ANIONGAP 9 03/22/2020   Lab Results  Component Value Date   CHOL 154 12/16/2019   Lab Results  Component Value Date   HDL 55 12/16/2019   Lab Results  Component Value Date   LDLCALC 84 12/16/2019   Lab Results  Component Value Date   TRIG 76 12/16/2019   Lab Results  Component Value Date   CHOLHDL 2.8 12/16/2019   Lab Results  Component Value Date   HGBA1C 7.4 (A) 05/04/2020       Assessment & Plan:   Problem List Items Addressed This Visit      Cardiovascular and Mediastinum   Essential hypertension    -INCREASE lisinopril-HCTZ to 20-12.5 x 1 tab in Am and 0.5 tab qPM      Relevant Medications   lisinopril-hydrochlorothiazide (ZESTORETIC) 20-12.5 MG tablet   Other Relevant Orders   CBC with Differential/Platelet   CMP14+EGFR   Lipid Panel With LDL/HDL Ratio   Type 2 diabetes mellitus with vascular disease ([place]   Relevant Medications   lisinopril-hydrochlorothiazide (ZESTORETIC) 20-12.5 MG tablet   Other Relevant Orders   CBC with Differential/Platelet   CMP14+EGFR   Hemoglobin A1c  Microalbumin / creatinine urine ratio   Lipid Panel With LDL/HDL Ratio     Other   Hyperlipidemia LDL goal <100   Relevant Medications   lisinopril-hydrochlorothiazide (ZESTORETIC) 20-12.5 MG tablet   Other Relevant Orders   CBC with Differential/Platelet   CMP14+EGFR   Lipid Panel With LDL/HDL Ratio       Meds ordered this encounter  Medications  .  lisinopril-hydrochlorothiazide (ZESTORETIC) 20-12.5 MG tablet    Sig: Take 1 tablet by mouth in the morning AND 0.5 tablets every evening.    Dispense:  135 tablet    Refill:  1    Increased dose today     Noreene Larsson, NP

## 2020-07-13 ENCOUNTER — Other Ambulatory Visit: Payer: Self-pay

## 2020-07-13 ENCOUNTER — Telehealth: Payer: Self-pay

## 2020-07-13 DIAGNOSIS — I10 Essential (primary) hypertension: Secondary | ICD-10-CM

## 2020-07-13 MED ORDER — LISINOPRIL-HYDROCHLOROTHIAZIDE 20-12.5 MG PO TABS: ORAL_TABLET | ORAL | 1 refills | Status: DC

## 2020-07-13 NOTE — Telephone Encounter (Signed)
Rx re-sent to [facility].

## 2020-07-13 NOTE — Telephone Encounter (Signed)
Patient called to let us know her medications that she was prescribed yesterday when she seen gray needs to go to [place] in [place] ph# [phone]

## 2020-07-28 ENCOUNTER — Other Ambulatory Visit: Payer: Self-pay | Admitting: Family Medicine

## 2020-08-01 ENCOUNTER — Encounter (HOSPITAL_COMMUNITY): Payer: Self-pay

## 2020-08-04 ENCOUNTER — Other Ambulatory Visit (HOSPITAL_COMMUNITY)
Admission: RE | Admit: 2020-08-04 | Discharge: 2020-08-04 | Disposition: A | Discharge status: Home / Self Care | Payer: Medicare HMO | Source: Ambulatory Visit | Attending: Internal Medicine | Admitting: Internal Medicine

## 2020-08-04 ENCOUNTER — Other Ambulatory Visit: Payer: Self-pay

## 2020-08-04 DIAGNOSIS — Z20822 Contact with and (suspected) exposure to covid-19: Secondary | ICD-10-CM | POA: Diagnosis not present

## 2020-08-04 DIAGNOSIS — Z01812 Encounter for preprocedural laboratory examination: Secondary | ICD-10-CM | POA: Insufficient documentation

## 2020-08-04 LAB — BASIC METABOLIC PANEL
Anion gap: 12 (ref 5–15)
BUN: 18 mg/dL (ref 8–23)
CO2: 24 mmol/L (ref 22–32)
Calcium: 9 mg/dL (ref 8.9–10.3)
Chloride: 102 mmol/L (ref 98–111)
Creatinine, Ser: 1.17 mg/dL — ABNORMAL HIGH (ref 0.44–1.00)
GFR, Estimated: 50 mL/min — ABNORMAL LOW (ref >60)
Glucose, Bld: 130 mg/dL — ABNORMAL HIGH (ref 70–99)
Potassium: 4.1 mmol/L (ref 3.5–5.1)
Sodium: 138 mmol/L (ref 135–145)

## 2020-08-05 LAB — SARS CORONAVIRUS 2 (TAT 6-24 HRS): SARS Coronavirus 2: NEGATIVE

## 2020-08-06 ENCOUNTER — Other Ambulatory Visit: Payer: Self-pay | Admitting: Family Medicine

## 2020-08-07 ENCOUNTER — Ambulatory Visit (HOSPITAL_COMMUNITY)
Admission: RE | Admit: 2020-08-07 | Discharge: 2020-08-07 | Disposition: A | Discharge status: Home / Self Care | Payer: Medicare HMO | Attending: Internal Medicine | Admitting: Internal Medicine

## 2020-08-07 ENCOUNTER — Encounter (HOSPITAL_COMMUNITY): Payer: Self-pay

## 2020-08-07 ENCOUNTER — Ambulatory Visit (HOSPITAL_COMMUNITY): Payer: Medicare HMO | Admitting: Anesthesiology

## 2020-08-07 ENCOUNTER — Encounter (HOSPITAL_COMMUNITY)
Admission: RE | Disposition: A | Discharge status: Home / Self Care | Payer: Self-pay | Source: Home / Self Care | Attending: Internal Medicine

## 2020-08-07 ENCOUNTER — Other Ambulatory Visit: Payer: Self-pay

## 2020-08-07 DIAGNOSIS — Z7984 Long term (current) use of oral hypoglycemic drugs: Secondary | ICD-10-CM | POA: Insufficient documentation

## 2020-08-07 DIAGNOSIS — K648 Other hemorrhoids: Secondary | ICD-10-CM | POA: Diagnosis not present

## 2020-08-07 DIAGNOSIS — Z1211 Encounter for screening for malignant neoplasm of colon: Secondary | ICD-10-CM | POA: Insufficient documentation

## 2020-08-07 DIAGNOSIS — Z794 Long term (current) use of insulin: Secondary | ICD-10-CM | POA: Insufficient documentation

## 2020-08-07 DIAGNOSIS — Z800 Family history of malignant neoplasm of digestive organs: Secondary | ICD-10-CM

## 2020-08-07 DIAGNOSIS — K635 Polyp of colon: Secondary | ICD-10-CM | POA: Diagnosis not present

## 2020-08-07 DIAGNOSIS — Z887 Allergy status to serum and vaccine status: Secondary | ICD-10-CM | POA: Diagnosis not present

## 2020-08-07 DIAGNOSIS — Z7982 Long term (current) use of aspirin: Secondary | ICD-10-CM | POA: Diagnosis not present

## 2020-08-07 DIAGNOSIS — Z8719 Personal history of other diseases of the digestive system: Secondary | ICD-10-CM | POA: Insufficient documentation

## 2020-08-07 DIAGNOSIS — K5730 Diverticulosis of large intestine without perforation or abscess without bleeding: Secondary | ICD-10-CM | POA: Insufficient documentation

## 2020-08-07 DIAGNOSIS — Z880 Allergy status to penicillin: Secondary | ICD-10-CM | POA: Insufficient documentation

## 2020-08-07 DIAGNOSIS — E119 Type 2 diabetes mellitus without complications: Secondary | ICD-10-CM | POA: Diagnosis not present

## 2020-08-07 DIAGNOSIS — D124 Benign neoplasm of descending colon: Secondary | ICD-10-CM | POA: Insufficient documentation

## 2020-08-07 HISTORY — PX: COLONOSCOPY WITH PROPOFOL: SHX5780

## 2020-08-07 HISTORY — PX: POLYPECTOMY: SHX5525

## 2020-08-07 LAB — GLUCOSE, CAPILLARY: Glucose-Capillary: 140 mg/dL — ABNORMAL HIGH (ref 70–99)

## 2020-08-07 SURGERY — COLONOSCOPY WITH PROPOFOL
Anesthesia: General

## 2020-08-07 MED ORDER — CHLORHEXIDINE GLUCONATE CLOTH 2 % EX PADS: 6.0000 | MEDICATED_PAD | Freq: Once | CUTANEOUS | Status: DC

## 2020-08-07 MED ORDER — PROPOFOL 500 MG/50ML IV EMUL
INTRAVENOUS | Status: DC | PRN
  Administered 2020-08-07: 125 ug/kg/min via INTRAVENOUS

## 2020-08-07 MED ORDER — PHENYLEPHRINE HCL (PRESSORS) 10 MG/ML IV SOLN
INTRAVENOUS | Status: DC | PRN
  Administered 2020-08-07: 120 ug via INTRAVENOUS

## 2020-08-07 MED ORDER — LACTATED RINGERS IV SOLN: INTRAVENOUS | Status: DC

## 2020-08-07 MED ORDER — PROPOFOL 10 MG/ML IV BOLUS
INTRAVENOUS | Status: DC | PRN
  Administered 2020-08-07: 100 mg via INTRAVENOUS

## 2020-08-07 NOTE — Anesthesia Preprocedure Evaluation (Addendum)
Anesthesia Evaluation  Patient identified by MRN, date of birth, ID band Patient awake    Reviewed: Allergy & Precautions, NPO status , Patient's Chart, lab work & pertinent test results  Airway Mallampati: II  TM Distance: >3 FB Neck ROM: Full    Dental  (+) Upper Dentures, Lower Dentures   Pulmonary neg pulmonary ROS,    Pulmonary exam normal breath sounds clear to auscultation       Cardiovascular Exercise Tolerance: Good hypertension, Pt. on medications Normal cardiovascular exam Rhythm:Regular Rate:Normal     Neuro/Psych negative neurological ROS  negative psych ROS   GI/Hepatic negative GI ROS, Neg liver ROS,   Endo/Other  diabetes, Well Controlled, Type 2, Oral Hypoglycemic Agents, Insulin Dependent  Renal/GU negative Renal ROS     Musculoskeletal  (+) Arthritis ,   Abdominal   Peds  Hematology  (+) anemia ,   Anesthesia Other Findings   Reproductive/Obstetrics                            Anesthesia Physical Anesthesia Plan  ASA: II  Anesthesia Plan: General   Post-op Pain Management:    Induction: Intravenous  PONV Risk Score and Plan: TIVA  Airway Management Planned: Nasal Cannula and Natural Airway  Additional Equipment:   Intra-op Plan:   Post-operative Plan:   Informed Consent: I have reviewed the patients History and Physical, chart, labs and discussed the procedure including the risks, benefits and alternatives for the proposed anesthesia with the patient or authorized representative who has indicated his/her understanding and acceptance.     Dental advisory given  Plan Discussed with: CRNA and Surgeon  Anesthesia Plan Comments:         Anesthesia Quick Evaluation

## 2020-08-07 NOTE — Anesthesia Postprocedure Evaluation (Signed)
Anesthesia Post Note  Patient: Helen Mahoney  Procedure(s) Performed: COLONOSCOPY WITH PROPOFOL (N/A ) POLYPECTOMY  Patient location during evaluation: Endoscopy Anesthesia Type: General Level of consciousness: awake and alert and oriented Pain management: pain level controlled Vital Signs Assessment: post-procedure vital signs reviewed and stable Respiratory status: spontaneous breathing and respiratory function stable Cardiovascular status: blood pressure returned to baseline and stable Postop Assessment: no apparent nausea or vomiting Anesthetic complications: no   No complications documented.   Last Vitals:  Vitals:   08/07/20 0811 08/07/20 0819  BP: (!) 86/59 99/65  Resp: 13   Temp:    SpO2: 95%     Last Pain:  Vitals:   08/07/20 0808  TempSrc: Oral  PainSc: 0-No pain                 Raevin Wierenga C Chloeann Alfred

## 2020-08-07 NOTE — Op Note (Signed)
[facility] Patient Name: Helen Mahoney Procedure Date: 08/07/2020 7:04 AM MRN: [id number] Date of Birth: January 31, 1951 Attending MD: Elon Alas. Abbey Chatters DO CSN: [id number] Age: 70 Admit Type: Outpatient Procedure:                Colonoscopy Indications:              Screening in patient at increased risk: Family                            history of 1st-degree relative with colorectal                            cancer before age 41 years Providers:                Elon Alas. Abbey Chatters, DO, Gwenlyn Fudge, RN, Dereck Leep, Technician Referring MD:              Medicines:                See the Anesthesia note for documentation of the                            administered medications Complications:            No immediate complications. Estimated Blood Loss:     Estimated blood loss was minimal. Procedure:                Pre-Anesthesia Assessment:                           - The anesthesia plan was to use monitored                            anesthesia care (MAC).                           After obtaining informed consent, the colonoscope                            was passed under direct vision. Throughout the                            procedure, the patient's blood pressure, pulse, and                            oxygen saturations were monitored continuously. The                            [id number]-H190DL ([id number]) scope was introduced through                            the anus and advanced to the the cecum, identified                            by appendiceal orifice and  ileocecal valve. The                            colonoscopy was performed without difficulty. The                            patient tolerated the procedure well. The quality                            of the bowel preparation was evaluated using the                            BBPS [facility] Bowel Preparation Scale) with scores                            of: Right Colon = 2 (minor  amount of residual                            staining, small fragments of stool and/or opaque                            liquid, but mucosa seen well), Transverse Colon = 2                            (minor amount of residual staining, small fragments                            of stool and/or opaque liquid, but mucosa seen                            well) and Left Colon = 2 (minor amount of residual                            staining, small fragments of stool and/or opaque                            liquid, but mucosa seen well). The total BBPS score                            equals 6. The quality of the bowel preparation was                            fair. Scope In: 7:49:46 AM Scope Out: 8:04:43 AM Scope Withdrawal Time: 0 hours 10 minutes 14 seconds  Total Procedure Duration: 0 hours 14 minutes 57 seconds  Findings:      The perianal and digital rectal examinations were normal.      Non-bleeding internal hemorrhoids were found during endoscopy.      Multiple small-mouthed diverticula were found in the sigmoid colon.      A 5 mm polyp was found in the descending colon. The polyp was sessile.       The polyp was removed with a cold snare. Resection and retrieval were       complete. Impression:               -  Preparation of the colon was fair.                           - Non-bleeding internal hemorrhoids.                           - Diverticulosis in the sigmoid colon.                           - One 5 mm polyp in the descending colon, removed                            with a cold snare. Resected and retrieved. Moderate Sedation:      Per Anesthesia Care Recommendation:           - Patient has a contact number available for                            emergencies. The signs and symptoms of potential                            delayed complications were discussed with the                            patient. Return to normal activities tomorrow.                            Written  discharge instructions were provided to the                            patient.                           - Resume previous diet.                           - Continue present medications.                           - Await pathology results.                           - Repeat colonoscopy in 5 years for surveillance.                           - Return to GI clinic PRN. Procedure Code(s):        --- Professional ---                           [phone], Colonoscopy, flexible; with removal of                            tumor(s), polyp(s), or other lesion(s) by snare                            technique Diagnosis Code(s):        ---  Professional ---                           Z80.0, Family history of malignant neoplasm of                            digestive organs                           K64.8, Other hemorrhoids                           K63.5, Polyp of colon                           K57.30, Diverticulosis of large intestine without                            perforation or abscess without bleeding CPT copyright 2019 American Medical Association. All rights reserved. The codes documented in this report are preliminary and upon coder review may  be revised to meet current compliance requirements. Elon Alas. Abbey Chatters, DO [place] Abbey Chatters, DO 08/07/2020 8:10:49 AM This report has been signed electronically. Number of Addenda: 0

## 2020-08-07 NOTE — Discharge Instructions (Addendum)
Colonoscopy Discharge Instructions  Read the instructions outlined below and refer to this sheet in the next few weeks. These discharge instructions provide you with general information on caring for yourself after you leave the hospital. Your doctor may also give you specific instructions. While your treatment has been planned according to the most current medical practices available, unavoidable complications occasionally occur.   ACTIVITY  You may resume your regular activity, but move at a slower pace for the next 24 hours.   Take frequent rest periods for the next 24 hours.   Walking will help get rid of the air and reduce the bloated feeling in your belly (abdomen).   No driving for 24 hours (because of the medicine (anesthesia) used during the test).    Do not sign any important legal documents or operate any machinery for 24 hours (because of the anesthesia used during the test).  NUTRITION  Drink plenty of fluids.   You may resume your normal diet as instructed by your doctor.   Begin with a light meal and progress to your normal diet. Heavy or fried foods are harder to digest and may make you feel sick to your stomach (nauseated).   Avoid alcoholic beverages for 24 hours or as instructed.  MEDICATIONS  You may resume your normal medications unless your doctor tells you otherwise.  WHAT YOU CAN EXPECT TODAY  Some feelings of bloating in the abdomen.   Passage of more gas than usual.   Spotting of blood in your stool or on the toilet paper.  IF YOU HAD POLYPS REMOVED DURING THE COLONOSCOPY:  No aspirin products for 7 days or as instructed.   No alcohol for 7 days or as instructed.   Eat a soft diet for the next 24 hours.  FINDING OUT THE RESULTS OF YOUR TEST Not all test results are available during your visit. If your test results are not back during the visit, make an appointment with your caregiver to find out the results. Do not assume everything is normal if  you have not heard from your caregiver or the medical facility. It is important for you to follow up on all of your test results.  SEEK IMMEDIATE MEDICAL ATTENTION IF:  You have more than a spotting of blood in your stool.   Your belly is swollen (abdominal distention).   You are nauseated or vomiting.   You have a temperature over 101.   You have abdominal pain or discomfort that is severe or gets worse throughout the day.   Your colonoscopy revealed 1 polyp(s) which I removed successfully. Await pathology results, my office will contact you. I recommend repeating colonoscopy in 5 years for surveillance purposes. You also have diverticulosis and internal hemorrhoids. I would recommend increasing fiber in your diet or adding OTC Benefiber/Metamucil. Be sure to drink at least 4 to 6 glasses of water daily. Follow-up with GI as needed.    I hope you have a great rest of your week!  Elon Alas. Abbey Chatters, D.O. Gastroenterology and Hepatology [facility]   Hemorrhoids Hemorrhoids are swollen veins that may develop:  In the butt (rectum). These are called internal hemorrhoids.  Around the opening of the butt (anus). These are called external hemorrhoids. Hemorrhoids can cause pain, itching, or bleeding. Most of the time, they do not cause serious problems. They usually get better with diet changes, lifestyle changes, and other home treatments. What are the causes? This condition may be caused by:  Having  trouble pooping (constipation).  Pushing hard (straining) to poop.  Watery poop (diarrhea).  Pregnancy.  Being very overweight (obese).  Sitting for long periods of time.  Heavy lifting or other activity that causes you to strain.  Anal sex.  Riding a bike for a long period of time. What are the signs or symptoms? Symptoms of this condition include:  Pain.  Itching or soreness in the butt.  Bleeding from the butt.  Leaking  poop.  Swelling in the area.  One or more lumps around the opening of your butt. How is this diagnosed? A doctor can often diagnose this condition by looking at the affected area. The doctor may also:  Do an exam that involves feeling the area with a gloved hand (digital rectal exam).  Examine the area inside your butt using a small tube (anoscope).  Order blood tests. This may be done if you have lost a lot of blood.  Have you get a test that involves looking inside the colon using a flexible tube with a camera on the end (sigmoidoscopy or colonoscopy). How is this treated? This condition can usually be treated at home. Your doctor may tell you to change what you eat, make lifestyle changes, or try home treatments. If these do not help, procedures can be done to remove the hemorrhoids or make them smaller. These may involve:  Placing rubber bands at the base of the hemorrhoids to cut off their blood supply.  Injecting medicine into the hemorrhoids to shrink them.  Shining a type of light energy onto the hemorrhoids to cause them to fall off.  Doing surgery to remove the hemorrhoids or cut off their blood supply. Follow these instructions at home: Eating and drinking  Eat foods that have a lot of fiber in them. These include whole grains, beans, nuts, fruits, and vegetables.  Ask your doctor about taking products that have added fiber (fibersupplements).  Reduce the amount of fat in your diet. You can do this by: ? Eating low-fat dairy products. ? Eating less red meat. ? Avoiding processed foods.  Drink enough fluid to keep your pee (urine) pale yellow.   Managing pain and swelling  Take a warm-water bath (sitz bath) for 20 minutes to ease pain. Do this 3-4 times a day. You may do this in a bathtub or using a portable sitz bath that fits over the toilet.  If told, put ice on the painful area. It may be helpful to use ice between your warm baths. ? Put ice in a plastic  bag. ? Place a towel between your skin and the bag. ? Leave the ice on for 20 minutes, 2-3 times a day.   General instructions  Take over-the-counter and prescription medicines only as told by your doctor. ? Medicated creams and medicines may be used as told.  Exercise often. Ask your doctor how much and what kind of exercise is best for you.  Go to the bathroom when you have the urge to poop. Do not wait.  Avoid pushing too hard when you poop.  Keep your butt dry and clean. Use wet toilet paper or moist towelettes after pooping.  Do not sit on the toilet for a long time.  Keep all follow-up visits as told by your doctor. This is important. Contact a doctor if you:  Have pain and swelling that do not get better with treatment or medicine.  Have trouble pooping.  Cannot poop.  Have pain or swelling outside the  area of the hemorrhoids. Get help right away if you have:  Bleeding that will not stop. Summary  Hemorrhoids are swollen veins in the butt or around the opening of the butt.  They can cause pain, itching, or bleeding.  Eat foods that have a lot of fiber in them. These include whole grains, beans, nuts, fruits, and vegetables.  Take a warm-water bath (sitz bath) for 20 minutes to ease pain. Do this 3-4 times a day. This information is not intended to replace advice given to you by your health care provider. Make sure you discuss any questions you have with your health care provider. Document Revised: 04/23/2018 Document Reviewed: 09/04/2017 Elsevier Patient Education  2021 [employer].  Diverticulosis  Diverticulosis is a condition that develops when small pouches (diverticula) form in the wall of the large intestine (colon). The colon is where water is absorbed and stool (feces) is formed. The pouches form when the inside layer of the colon pushes through weak spots in the outer layers of the colon. You may have a few pouches or many of them. The pouches usually  do not cause problems unless they become inflamed or infected. When this happens, the condition is called diverticulitis. What are the causes? The cause of this condition is not known. What increases the risk? The following factors may make you more likely to develop this condition:  Being older than age 85. Your risk for this condition increases with age. Diverticulosis is rare among people younger than age 29. By age 70, many people have it.  Eating a low-fiber diet.  Having frequent constipation.  Being overweight.  Not getting enough exercise.  Smoking.  Taking over-the-counter pain medicines, like aspirin and ibuprofen.  Having a family history of diverticulosis. What are the signs or symptoms? In most people, there are no symptoms of this condition. If you do have symptoms, they may include:  Bloating.  Cramps in the abdomen.  Constipation or diarrhea.  Pain in the lower left side of the abdomen. How is this diagnosed? Because diverticulosis usually has no symptoms, it is most often diagnosed during an exam for other colon problems. The condition may be diagnosed by:  Using a flexible scope to examine the colon (colonoscopy).  Taking an X-ray of the colon after dye has been put into the colon (barium enema).  Having a CT scan. How is this treated? You may not need treatment for this condition. Your health care provider may recommend treatment to prevent problems. You may need treatment if you have symptoms or if you previously had diverticulitis. Treatment may include:  Eating a high-fiber diet.  Taking a fiber supplement.  Taking a live bacteria supplement (probiotic).  Taking medicine to relax your colon.   Follow these instructions at home: Medicines  Take over-the-counter and prescription medicines only as told by your health care provider.  If told by your health care provider, take a fiber supplement or probiotic. Constipation prevention Your  condition may cause constipation. To prevent or treat constipation, you may need to:  Drink enough fluid to keep your urine pale yellow.  Take over-the-counter or prescription medicines.  Eat foods that are high in fiber, such as beans, whole grains, and fresh fruits and vegetables.  Limit foods that are high in fat and processed sugars, such as fried or sweet foods.   General instructions  Try not to strain when you have a bowel movement.  Keep all follow-up visits as told by your health care  provider. This is important. Contact a health care provider if you:  Have pain in your abdomen.  Have bloating.  Have cramps.  Have not had a bowel movement in 3 days. Get help right away if:  Your pain gets worse.  Your bloating becomes very bad.  You have a fever or chills, and your symptoms suddenly get worse.  You vomit.  You have bowel movements that are bloody or black.  You have bleeding from your rectum. Summary  Diverticulosis is a condition that develops when small pouches (diverticula) form in the wall of the large intestine (colon).  You may have a few pouches or many of them.  This condition is most often diagnosed during an exam for other colon problems.  Treatment may include increasing the fiber in your diet, taking supplements, or taking medicines. This information is not intended to replace advice given to you by your health care provider. Make sure you discuss any questions you have with your health care provider. Document Revised: 11/12/2018 Document Reviewed: 11/12/2018 Elsevier Patient Education  [employer].  Colon Polyps  Colon polyps are tissue growths inside the colon, which is part of the large intestine. They are one of the types of polyps that can grow in the body. A polyp may be a round bump or a mushroom-shaped growth. You could have one polyp or more than one. Most colon polyps are noncancerous (benign). However, some colon polyps can  become cancerous over time. Finding and removing the polyps early can help prevent this. What are the causes? The exact cause of colon polyps is not known. What increases the risk? The following factors may make you more likely to develop this condition:  Having a family history of colorectal cancer or colon polyps.  Being older than 70 years of age.  Being younger than 70 years of age and having a significant family history of colorectal cancer or colon polyps or a genetic condition that puts you at higher risk of getting colon polyps.  Having inflammatory bowel disease, such as ulcerative colitis or Crohn's disease.  Having certain conditions passed from parent to child (hereditary conditions), such as: ? Familial adenomatous polyposis (FAP). ? Lynch syndrome. ? Turcot syndrome. ? Peutz-Jeghers syndrome. ? MUTYH-associated polyposis (MAP).  Being overweight.  Certain lifestyle factors. These include smoking cigarettes, drinking too much alcohol, not getting enough exercise, and eating a diet that is high in fat and red meat and low in fiber.  Having had childhood cancer that was treated with radiation of the abdomen. What are the signs or symptoms? Many times, there are no symptoms. If you have symptoms, they may include:  Blood coming from the rectum during a bowel movement.  Blood in the stool (feces). The blood may be bright red or very dark in color.  Pain in the abdomen.  A change in bowel habits, such as constipation or diarrhea. How is this diagnosed? This condition is diagnosed with a colonoscopy. This is a procedure in which a lighted, flexible scope is inserted into the opening between the buttocks (anus) and then passed into the colon to examine the area. Polyps are sometimes found when a colonoscopy is done as part of routine cancer screening tests. How is this treated? This condition is treated by removing any polyps that are found. Most polyps can be removed  during a colonoscopy. Those polyps will then be tested for cancer. Additional treatment may be needed depending on the results of testing. Follow these  instructions at home: Eating and drinking  Eat foods that are high in fiber, such as fruits, vegetables, and whole grains.  Eat foods that are high in calcium and vitamin D, such as milk, cheese, yogurt, eggs, liver, fish, and broccoli.  Limit foods that are high in fat, such as fried foods and desserts.  Limit the amount of red meat, precooked or cured meat, or other processed meat that you eat, such as hot dogs, sausages, bacon, or meat loaves.  Limit sugary drinks.   Lifestyle  Maintain a healthy weight, or lose weight if recommended by your health care provider.  Exercise every day or as told by your health care provider.  Do not use any products that contain nicotine or tobacco, such as cigarettes, e-cigarettes, and chewing tobacco. If you need help quitting, ask your health care provider.  Do not drink alcohol if: ? Your health care provider tells you not to drink. ? You are pregnant, may be pregnant, or are planning to become pregnant.  If you drink alcohol: ? Limit how much you use to:  0-1 drink a day for women.  0-2 drinks a day for men. ? Know how much alcohol is in your drink. In the [place] one drink equals one 12 oz bottle of beer (355 mL), one 5 oz glass of wine (148 mL), or one 1 oz glass of hard liquor (44 mL). General instructions  Take over-the-counter and prescription medicines only as told by your health care provider.  Keep all follow-up visits. This is important. This includes having regularly scheduled colonoscopies. Talk to your health care provider about when you need a colonoscopy. Contact a health care provider if:  You have new or worsening bleeding during a bowel movement.  You have new or increased blood in your stool.  You have a change in bowel habits.  You lose weight for no known  reason. Summary  Colon polyps are tissue growths inside the colon, which is part of the large intestine. They are one type of polyp that can grow in the body.  Most colon polyps are noncancerous (benign), but some can become cancerous over time.  This condition is diagnosed with a colonoscopy.  This condition is treated by removing any polyps that are found. Most polyps can be removed during a colonoscopy. This information is not intended to replace advice given to you by your health care provider. Make sure you discuss any questions you have with your health care provider. Document Revised: 08/04/2019 Document Reviewed: 08/04/2019 Elsevier Patient Education  2021 [employer].

## 2020-08-07 NOTE — H&P (Signed)
Primary Care Physician:  Fayrene Helper, MD Primary Gastroenterologist:  Dr. Abbey Chatters  Pre-Procedure History & Physical: HPI:  Helen Mahoney is a 70 y.o. female is here for a colonoscopy to be performed for high risk colon cancer screening purposes due to family history of colon cancer in sister. No melena or hematochezia.  No abdominal pain or unintentional weight loss.  No change in bowel habits.  Overall feels well from a GI standpoint.  Past Medical History:  Diagnosis Date  . 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 B12 deficiency 06/16/2015  . Colon polyps   . Diabetes mellitus, type 2 ([place])   . Hyperlipidemia   . Hypertension   . Microcytic anemia 05/18/2015   2013: TCS/EGD 2017: TCS/EGD HYPERPLASTIC GASTRIC POLYPS, LYMPHOCYTIC GASTRITIS   . Obesity     Past Surgical History:  Procedure Laterality Date  . bilateral tubal ligation  1979  . CHOLECYSTECTOMY  2001   ACUTE CHOLECYSTITIS/GALLSTONES  . COLONOSCOPY  05/14/2011   Dr. Oneida Alar: sessile polyp in sigmoid colon, internal hemorrhoids, hyerplastic polyps  . COLONOSCOPY N/A 05/15/2015   Procedure: COLONOSCOPY;  Surgeon: Danie Binder, MD;  [facility]: [facility];  Service: Endoscopy;  Laterality: N/A;  0830  . COLONOSCOPY W/ POLYPECTOMY  NOV 2008 [facility] ANEMIA   POLYP NO RETRIEVED  . COLONOSCOPY W/ POLYPECTOMY  2006 DR. SMTH   POLYP?  . ESOPHAGOGASTRODUODENOSCOPY  05/14/2011   Dr. Oneida Alar: sessile polyps in the cardia, mild gastritis. Chronic duodenitis consistent with peptic duodenitis, chronic active H.pylori gastritis.   Marland Kitchen ESOPHAGOGASTRODUODENOSCOPY N/A 05/15/2015   Procedure: ESOPHAGOGASTRODUODENOSCOPY (EGD);  Surgeon: Danie Binder, MD;  Location: [facility];  Service: Endoscopy;  Laterality: N/A;  . GIVENS CAPSULE STUDY N/A 06/02/2015   Procedure: GIVENS CAPSULE STUDY;  Surgeon: Danie Binder, MD;  Location: [facility];   Service: Endoscopy;  Laterality: N/A;  0700  . TUBAL LIGATION    . UPPER GASTROINTESTINAL ENDOSCOPY  NOV 2008 [facility] ANEMIA   NL EXAM, urease neg    Prior to Admission medications   Medication Sig Start Date End Date Taking? Authorizing Provider  Alcohol Swabs (B-D SINGLE USE SWABS REGULAR) PADS USE AS DIRECTED THREE TIMES DAILY 04/03/20  Yes Fayrene Helper, MD  aspirin 81 MG tablet Take 81 mg by mouth daily.   Yes [provider]  Blood Glucose Calibration (TRUE METRIX LEVEL 1) Low SOLN  03/08/20  Yes [provider]  Calcium Citrate-Vitamin D 200-250 MG-UNIT TABS Take 1 tablet by mouth daily.   Yes [provider]  Cholecalciferol (VITAMIN D3) 2000 units TABS Take 2,000 Units by mouth daily.   Yes [provider]  glipiZIDE (GLUCOTROL) 10 MG tablet TAKE 1 TABLET TWICE DAILY BEFORE A MEAL Patient taking differently: Take 10 mg by mouth 2 (two) times daily before a meal. 05/29/20  Yes Fayrene Helper, MD  glucose blood (TRUE METRIX BLOOD GLUCOSE TEST) test strip TEST BLOOD SUGAR THREE TIMES DAILY 05/29/20  Yes Fayrene Helper, MD  LANTUS SOLOSTAR 100 UNIT/ML Solostar Pen INJECT 50 UNITS SUBCUTANEOUSLY ONCE DAILY AT  10  PM Patient taking differently: Inject 50 Units into the skin at bedtime. 07/06/20  Yes Fayrene Helper, MD  lisinopril-hydrochlorothiazide (ZESTORETIC) 20-12.5 MG tablet Take 1 tablet by mouth in the morning AND 0.5 tablets every evening. 07/13/20 10/11/20 Yes Noreene Larsson, NP  metFORMIN (GLUCOPHAGE) 1000 MG tablet TAKE 1 TABLET  TWICE DAILY WITH MEALS Patient taking differently: Take 1,000 mg by mouth 2 (two) times daily with a meal. 07/10/20  Yes Fayrene Helper, MD  Multiple Vitamin (MULTIVITAMINS PO) Take 1 tablet by mouth daily.   Yes [provider]  pravastatin (PRAVACHOL) 80 MG tablet TAKE 1 TABLET ONE TIME DAILY IN THE EVENING 07/31/20  Yes Fayrene Helper, MD  TRUEplus Lancets 33G MISC CHECK BLOOD SUGAR THREE  TIMES DAILY 04/03/20  Yes Fayrene Helper, MD  DROPLET PEN NEEDLES 31G X 8 MM MISC USE 1 PEN ONCE DAILY AS DIRECTED 02/22/20   Fayrene Helper, MD    Allergies as of 06/21/2020 - Review Complete 05/31/2020  Allergen Reaction Noted  . Tetanus toxoids  07/17/2010  . Penicillins Rash 08/20/2007    Family History  Problem Relation Age of Onset  . Heart disease Mother   . Diabetes Mother   . Hypertension Mother   . Stroke Mother   . Breast cancer Mother   . Diabetes Father   . Heart attack Father   . Kidney disease Father   . Hypertension Sister   . Diabetes Sister   . Diabetes Sister   . Mental illness Sister   . Hypertension Sister   . Hepatitis C Sister   . Mental illness Sister   . Diabetes Sister   . Hypertension Sister   . Mental illness Sister   . Colon cancer Sister 60  . Diabetes Brother     Social History   Socioeconomic History  . Marital status: Married    Spouse name: Not on file  . Number of children: Not on file  . Years of education: Not on file  . Highest education level: Not on file  Occupational History  . Not on file  Tobacco Use  . Smoking status: Never Smoker  . Smokeless tobacco: Never Used  Vaping Use  . Vaping Use: Never used  Substance and Sexual Activity  . Alcohol use: No  . Drug use: No  . Sexual activity: Not Currently  Other Topics Concern  . Not on file  Social History Narrative  . Not on file   Social Determinants of Health   Financial Resource Strain: Low Risk   . Difficulty of Paying Living Expenses: Not hard at all  Food Insecurity: No Food Insecurity  . Worried About Charity fundraiser in the Last Year: Never true  . Ran Out of Food in the Last Year: Never true  Transportation Needs: No Transportation Needs  . Lack of Transportation (Medical): No  . Lack of Transportation (Non-Medical): No  Physical Activity: Insufficiently Active  . Days of Exercise per Week: 3 days  . Minutes of Exercise per Session: 30  min  Stress: No Stress Concern Present  . Feeling of Stress : Not at all  Social Connections: Moderately Isolated  . Frequency of Communication with Friends and Family: More than three times a week  . Frequency of Social Gatherings with Friends and Family: More than three times a week  . Attends Religious Services: More than 4 times per year  . Active Member of Clubs or Organizations: No  . Attends Archivist Meetings: Never  . Marital Status: Widowed  Intimate Partner Violence: Not At Risk  . Fear of Current or Ex-Partner: No  . Emotionally Abused: No  . Physically Abused: No  . Sexually Abused: No    Review of Systems: See HPI, otherwise negative ROS  Physical Exam: Vital  signs in last 24 hours: Temp:  [98 F (36.7 C)] 98 F (36.7 C) (04/11 0652) Resp:  [16] 16 (04/11 0652) BP: (122)/(72) 122/72 (04/11 0652) SpO2:  [98 %] 98 % (04/11 0652) Weight:  [104.3 kg] 104.3 kg (04/11 0641)   General:   Alert,  Well-developed, well-nourished, pleasant and cooperative in NAD Head:  Normocephalic and atraumatic. Eyes:  Sclera clear, no icterus.   Conjunctiva pink. Ears:  Normal auditory acuity. Nose:  No deformity, discharge,  or lesions. Mouth:  No deformity or lesions, dentition normal. Neck:  Supple; no masses or thyromegaly. Lungs:  Clear throughout to auscultation.   No wheezes, crackles, or rhonchi. No acute distress. Heart:  Regular rate and rhythm; no murmurs, clicks, rubs,  or gallops. Abdomen:  Soft, nontender and nondistended. No masses, hepatosplenomegaly or hernias noted. Normal bowel sounds, without guarding, and without rebound.   Msk:  Symmetrical without gross deformities. Normal posture. Extremities:  Without clubbing or edema. Neurologic:  Alert and  oriented x4;  grossly normal neurologically. Skin:  Intact without significant lesions or rashes. Cervical Nodes:  No significant cervical adenopathy. Psych:  Alert and cooperative. Normal mood and  affect.  Impression/Plan: KESA BIRKY is here for a colonoscopy to be performed for high risk colon cancer screening purposes due to family history of colon cancer in sister.  The risks of the procedure including infection, bleed, or perforation as well as benefits, limitations, alternatives and imponderables have been reviewed with the patient. Questions have been answered. All parties agreeable.

## 2020-08-07 NOTE — Transfer of Care (Signed)
Immediate Anesthesia Transfer of Care Note  Patient: Helen Mahoney  Procedure(s) Performed: COLONOSCOPY WITH PROPOFOL (N/A ) POLYPECTOMY  Patient Location: PACU  Anesthesia Type:General  Level of Consciousness: awake  Airway & Oxygen Therapy: Patient Spontanous Breathing  Post-op Assessment: Report given to RN and Post -op Vital signs reviewed and stable  Post vital signs: Reviewed and stable  Last Vitals:  Vitals Value Taken Time  BP    Temp    Pulse 83   Resp 21   SpO2 95     Last Pain:  Vitals:   08/07/20 0745  TempSrc:   PainSc: 0-No pain      Patients Stated Pain Goal: 8 (17/79/39 0300)  Complications: No complications documented.

## 2020-08-08 LAB — SURGICAL PATHOLOGY

## 2020-08-10 ENCOUNTER — Encounter (HOSPITAL_COMMUNITY): Payer: Self-pay | Admitting: Internal Medicine

## 2020-08-14 DIAGNOSIS — I10 Essential (primary) hypertension: Secondary | ICD-10-CM | POA: Diagnosis not present

## 2020-08-14 DIAGNOSIS — E785 Hyperlipidemia, unspecified: Secondary | ICD-10-CM | POA: Diagnosis not present

## 2020-08-14 DIAGNOSIS — E1159 Type 2 diabetes mellitus with other circulatory complications: Secondary | ICD-10-CM | POA: Diagnosis not present

## 2020-08-16 ENCOUNTER — Encounter: Payer: Self-pay | Admitting: Family Medicine

## 2020-08-16 ENCOUNTER — Other Ambulatory Visit: Payer: Self-pay

## 2020-08-16 ENCOUNTER — Ambulatory Visit (INDEPENDENT_AMBULATORY_CARE_PROVIDER_SITE_OTHER): Payer: Medicare HMO | Admitting: Family Medicine

## 2020-08-16 VITALS — BP 117/75 | HR 86 | Temp 97.5°F | Ht 67.0 in | Wt 235.0 lb

## 2020-08-16 DIAGNOSIS — Z1231 Encounter for screening mammogram for malignant neoplasm of breast: Secondary | ICD-10-CM

## 2020-08-16 DIAGNOSIS — E1159 Type 2 diabetes mellitus with other circulatory complications: Secondary | ICD-10-CM

## 2020-08-16 DIAGNOSIS — E785 Hyperlipidemia, unspecified: Secondary | ICD-10-CM | POA: Diagnosis not present

## 2020-08-16 DIAGNOSIS — I10 Essential (primary) hypertension: Secondary | ICD-10-CM

## 2020-08-16 LAB — CMP14+EGFR
ALT: 14 IU/L (ref 0–32)
AST: 14 IU/L (ref 0–40)
Albumin/Globulin Ratio: 1.3 (ref 1.2–2.2)
Albumin: 3.7 g/dL — ABNORMAL LOW (ref 3.8–4.8)
Alkaline Phosphatase: 124 IU/L — ABNORMAL HIGH (ref 44–121)
BUN/Creatinine Ratio: 18 (ref 12–28)
BUN: 18 mg/dL (ref 8–27)
Bilirubin Total: 0.3 mg/dL (ref 0.0–1.2)
CO2: 21 mmol/L (ref 20–29)
Calcium: 9.4 mg/dL (ref 8.7–10.3)
Chloride: 100 mmol/L (ref 96–106)
Creatinine, Ser: 1 mg/dL (ref 0.57–1.00)
Globulin, Total: 2.8 g/dL (ref 1.5–4.5)
Glucose: 116 mg/dL — ABNORMAL HIGH (ref 65–99)
Potassium: 4.4 mmol/L (ref 3.5–5.2)
Sodium: 139 mmol/L (ref 134–144)
Total Protein: 6.5 g/dL (ref 6.0–8.5)
eGFR: 61 mL/min/{1.73_m2} (ref >59)

## 2020-08-16 LAB — CBC WITH DIFFERENTIAL/PLATELET
Basophils Absolute: 0.1 10*3/uL (ref 0.0–0.2)
Basos: 1 %
EOS (ABSOLUTE): 0.4 10*3/uL (ref 0.0–0.4)
Eos: 6 %
Hematocrit: 33.8 % — ABNORMAL LOW (ref 34.0–46.6)
Hemoglobin: 10.1 g/dL — ABNORMAL LOW (ref 11.1–15.9)
Immature Grans (Abs): 0.1 10*3/uL (ref 0.0–0.1)
Immature Granulocytes: 1 %
Lymphocytes Absolute: 2.4 10*3/uL (ref 0.7–3.1)
Lymphs: 31 %
MCH: 21.1 pg — ABNORMAL LOW (ref 26.6–33.0)
MCHC: 29.9 g/dL — ABNORMAL LOW (ref 31.5–35.7)
MCV: 71 fL — ABNORMAL LOW (ref 79–97)
Monocytes Absolute: 0.6 10*3/uL (ref 0.1–0.9)
Monocytes: 7 %
Neutrophils Absolute: 4.2 10*3/uL (ref 1.4–7.0)
Neutrophils: 54 %
Platelets: 382 10*3/uL (ref 150–450)
RBC: 4.78 x10E6/uL (ref 3.77–5.28)
RDW: 16 % — ABNORMAL HIGH (ref 11.7–15.4)
WBC: 7.7 10*3/uL (ref 3.4–10.8)

## 2020-08-16 LAB — LIPID PANEL WITH LDL/HDL RATIO
Cholesterol, Total: 173 mg/dL (ref 100–199)
HDL: 47 mg/dL (ref >39)
LDL Chol Calc (NIH): 108 mg/dL — ABNORMAL HIGH (ref 0–99)
LDL/HDL Ratio: 2.3 ratio (ref 0.0–3.2)
Triglycerides: 97 mg/dL (ref 0–149)
VLDL Cholesterol Cal: 18 mg/dL (ref 5–40)

## 2020-08-16 LAB — MICROALBUMIN / CREATININE URINE RATIO
Creatinine, Urine: 108.7 mg/dL
Microalb/Creat Ratio: 19 mg/g creat (ref 0–29)
Microalbumin, Urine: 20.5 ug/mL

## 2020-08-16 LAB — HEMOGLOBIN A1C
Est. average glucose Bld gHb Est-mCnc: 166 mg/dL
Hgb A1c MFr Bld: 7.4 % — ABNORMAL HIGH (ref 4.8–5.6)

## 2020-08-16 NOTE — Patient Instructions (Signed)
Annual exam in office with MD in 5-1/2 months.  Call if you need me sooner.    Please reduce your fried and fatty foods your bad cholesterol is higher than it should be.  Please commit to regular exercise even 3 days/week to improve your overall health.  Blood pressure  and blood sugar are well controlled no change in medications at this time.  Please schedule screening mammogram to be done at [facility] at the time of checkout.  Fasting lipid CMP and EGFR and hemoglobin A1c 1 week before next visit.  Thanks for choosing [facility], we consider it a privelige to serve you.

## 2020-08-16 NOTE — Assessment & Plan Note (Signed)
Controlled, no change in medication DASH diet and commitment to daily physical activity for a minimum of 30 minutes discussed and encouraged, as a part of hypertension management. The importance of attaining a healthy weight is also discussed.  BP/Weight 08/16/2020 08/07/2020 07/12/2020 05/31/2020 05/04/2020 03/30/2020 68/86/4847  Systolic BP 207 99 218 - 288 337 445  Diastolic BP 75 65 80 - 87 66 66  Wt. (Lbs) 235 230 238 241.6 238.12 241.18 -  BMI 36.81 36.02 37.28 37.84 35.16 35.62 -

## 2020-08-16 NOTE — Addendum Note (Signed)
Addended by: Laretta Bolster on: 08/16/2020 11:00 AM   Modules accepted: Orders

## 2020-08-16 NOTE — Addendum Note (Signed)
Addended by: Eual Fines on: 08/16/2020 10:57 AM   Modules accepted: Orders

## 2020-08-16 NOTE — Assessment & Plan Note (Signed)
Needs to reduce fat in diet Hyperlipidemia:Low fat diet discussed and encouraged.   Lipid Panel  Lab Results  Component Value Date   CHOL 173 08/14/2020   HDL 47 08/14/2020   LDLCALC 108 (H) 08/14/2020   TRIG 97 08/14/2020   CHOLHDL 2.8 12/16/2019

## 2020-08-16 NOTE — Assessment & Plan Note (Signed)
  Patient re-educated about  the importance of commitment to a  minimum of 150 minutes of exercise per week as able.  The importance of healthy food choices with portion control discussed, as well as eating regularly and within a 12 hour window most days. The need to choose "clean , green" food 50 to 75% of the time is discussed, as well as to make water the primary drink and set a goal of 64 ounces water daily.    Weight /BMI 08/16/2020 08/07/2020 07/12/2020  WEIGHT 235 lb 230 lb 238 lb  HEIGHT 5\' 7"  5\' 7"  5\' 7"   BMI 36.81 kg/m2 36.02 kg/m2 37.28 kg/m2

## 2020-08-16 NOTE — Assessment & Plan Note (Signed)
Controlled, no change in medication Helen Mahoney is reminded of the importance of commitment to daily physical activity for 30 minutes or more, as able and the need to limit carbohydrate intake to 30 to 60 grams per meal to help with blood sugar control.   The need to take medication as prescribed, test blood sugar as directed, and to call between visits if there is a concern that blood sugar is uncontrolled is also discussed.   Helen Mahoney is reminded of the importance of daily foot exam, annual eye examination, and good blood sugar, blood pressure and cholesterol control.  Diabetic Labs Latest Ref Rng & Units 08/14/2020 08/04/2020 07/07/2020 05/04/2020 03/22/2020  HbA1c 4.8 - 5.6 % 7.4(H) - - 7.4(A) -  Microalbumin mg/dL - - - - -  Micro/Creat Ratio 0 - 29 mg/g creat 19 - - 14 -  Chol 100 - 199 mg/dL 173 - - - -  HDL >39 mg/dL 47 - - - -  Calc LDL 0 - 99 mg/dL 108(H) - - - -  Triglycerides 0 - 149 mg/dL 97 - - - -  Creatinine 0.57 - 1.00 mg/dL 1.00 1.17(H) 1.07(H) - 1.10(H)   BP/Weight 08/16/2020 08/07/2020 07/12/2020 05/31/2020 05/04/2020 03/30/2020 44/31/5400  Systolic BP 867 99 619 - 509 326 712  Diastolic BP 75 65 80 - 87 66 66  Wt. (Lbs) 235 230 238 241.6 238.12 241.18 -  BMI 36.81 36.02 37.28 37.84 35.16 35.62 -   Foot/eye exam completion dates Latest Ref Rng & Units 12/16/2019 03/22/2019  Eye Exam No Retinopathy - No Retinopathy  Foot exam Order - - -  Foot Form Completion - Done -

## 2020-08-16 NOTE — Progress Notes (Signed)
Helen Mahoney     MRN: [id number]      DOB: 1950/11/30   HPI Helen Mahoney is here for follow up and re-evaluation of chronic medical conditions, medication management and review of any available recent lab and radiology data.  Preventive health is updated, specifically  Cancer screening and Immunization.   Questions or concerns regarding consultations or procedures which the PT has had in the interim are  addressed. The PT denies any adverse reactions to current medications since the last visit.  There are no new concerns.  There are no specific complaints   ROS Denies recent fever or chills. Denies sinus pressure, nasal congestion, ear pain or sore throat. Denies chest congestion, productive cough or wheezing. Denies chest pains, palpitations and leg swelling Denies abdominal pain, nausea, vomiting,diarrhea or constipation.   Denies dysuria, frequency, hesitancy or incontinence. Denies joint pain, swelling and limitation in mobility. Denies headaches, seizures, numbness, or tingling. Denies depression, anxiety or insomnia. Denies skin break down or rash.   PE  BP 117/75 (BP Location: Right Arm, Patient Position: Sitting, Cuff Size: Large)   Pulse 86   Temp (!) 97.5 F (36.4 C) (Temporal)   Ht 5\' 7"  (1.702 m)   Wt 235 lb (106.6 kg)   SpO2 95%   BMI 36.81 kg/m   Patient alert and oriented and in no cardiopulmonary distress.  HEENT: No facial asymmetry, EOMI,     Neck supple .  Chest: Clear to auscultation bilaterally.  CVS: S1, S2 no murmurs, no S3.Regular rate.  ABD: Soft non tender.   Ext: No edema  MS: Adequate ROM spine, shoulders, hips and knees.  Skin: Intact, no ulcerations or rash noted.  Psych: Good eye contact, normal affect. Memory intact not anxious or depressed appearing.  CNS: CN 2-12 intact, power,  normal throughout.no focal deficits noted.   Assessment & Plan  Essential hypertension Controlled, no change in medication DASH diet and  commitment to daily physical activity for a minimum of 30 minutes discussed and encouraged, as a part of hypertension management. The importance of attaining a healthy weight is also discussed.  BP/Weight 08/16/2020 08/07/2020 07/12/2020 05/31/2020 05/04/2020 03/30/2020 26/33/3545  Systolic BP 625 99 638 - 937 342 876  Diastolic BP 75 65 80 - 87 66 66  Wt. 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   The need to take medication as prescribed, test blood sugar as directed, and to call between visits if there is a concern that blood sugar is uncontrolled is also discussed.   Helen Mahoney is reminded of the importance of daily foot exam, annual eye examination, and good blood sugar, blood pressure and cholesterol control.  Diabetic Labs Latest Ref Rng & Units 08/14/2020 08/04/2020 07/07/2020 05/04/2020 03/22/2020  HbA1c 4.8 - 5.6 % 7.4(H) - - 7.4(A) -  Microalbumin mg/dL - - - - -  Micro/Creat Ratio 0 - 29 mg/g creat 19 - - 14 -  Chol 100 - 199 mg/dL 173 - - - -  HDL >39 mg/dL 47 - - - -  Calc LDL 0 - 99 mg/dL  108(H) - - - -  Triglycerides 0 - 149 mg/dL 97 - - - -  Creatinine 0.57 - 1.00 mg/dL 1.00 1.17(H) 1.07(H) - 1.10(H)   BP/Weight 08/16/2020 08/07/2020 07/12/2020 05/31/2020 05/04/2020 03/30/2020 65/46/5035  Systolic BP 465 99 681 - 275 170 017  Diastolic BP 75 65 80 - 87 66 66  Wt. (Lbs) 235 230 238 241.6 238.12 241.18 -  BMI 36.81 36.02 37.28 37.84 35.16 35.62 -   Foot/eye exam completion dates Latest Ref Rng & Units 12/16/2019 03/22/2019  Eye Exam No Retinopathy - No Retinopathy  Foot exam Order - - -  Foot Form Completion - Done -         Hyperlipidemia LDL goal <100 Needs to reduce fat in diet Hyperlipidemia:Low fat diet discussed and encouraged.   Lipid Panel  Lab Results  Component Value Date   CHOL 173 08/14/2020   HDL 47 08/14/2020   LDLCALC 108 (H) 08/14/2020   TRIG 97 08/14/2020   CHOLHDL 2.8 12/16/2019       Morbid obesity  Patient re-educated about  the importance of commitment to a  minimum of 150 minutes of exercise per week as able.  The importance of healthy food choices with portion control discussed, as well as eating regularly and within a 12 hour window most days. The need to choose "clean , green" food 50 to 75% of the time is discussed, as well as to make water the primary drink and set a goal of 64 ounces water daily.    Weight /BMI 08/16/2020 08/07/2020 07/12/2020  WEIGHT 235 lb 230 lb 238 lb  HEIGHT 5\' 7"  5\' 7"  5\' 7"   BMI 36.81 kg/m2 36.02 kg/m2 37.28 kg/m2

## 2020-09-08 ENCOUNTER — Other Ambulatory Visit: Payer: Self-pay | Admitting: Family Medicine

## 2020-09-14 ENCOUNTER — Other Ambulatory Visit: Payer: Self-pay | Admitting: Family Medicine

## 2020-10-02 ENCOUNTER — Other Ambulatory Visit (HOSPITAL_COMMUNITY): Payer: Self-pay

## 2020-10-02 DIAGNOSIS — E538 Deficiency of other specified B group vitamins: Secondary | ICD-10-CM

## 2020-10-02 DIAGNOSIS — D508 Other iron deficiency anemias: Secondary | ICD-10-CM

## 2020-10-03 ENCOUNTER — Inpatient Hospital Stay (HOSPITAL_COMMUNITY): Payer: Medicare HMO | Attending: Hematology

## 2020-10-03 ENCOUNTER — Other Ambulatory Visit: Payer: Self-pay

## 2020-10-03 DIAGNOSIS — Z818 Family history of other mental and behavioral disorders: Secondary | ICD-10-CM | POA: Diagnosis not present

## 2020-10-03 DIAGNOSIS — D509 Iron deficiency anemia, unspecified: Secondary | ICD-10-CM | POA: Insufficient documentation

## 2020-10-03 DIAGNOSIS — E669 Obesity, unspecified: Secondary | ICD-10-CM | POA: Insufficient documentation

## 2020-10-03 DIAGNOSIS — E785 Hyperlipidemia, unspecified: Secondary | ICD-10-CM | POA: Diagnosis not present

## 2020-10-03 DIAGNOSIS — Z8249 Family history of ischemic heart disease and other diseases of the circulatory system: Secondary | ICD-10-CM | POA: Insufficient documentation

## 2020-10-03 DIAGNOSIS — Z833 Family history of diabetes mellitus: Secondary | ICD-10-CM | POA: Diagnosis not present

## 2020-10-03 DIAGNOSIS — Z9049 Acquired absence of other specified parts of digestive tract: Secondary | ICD-10-CM | POA: Insufficient documentation

## 2020-10-03 DIAGNOSIS — E538 Deficiency of other specified B group vitamins: Secondary | ICD-10-CM | POA: Diagnosis not present

## 2020-10-03 DIAGNOSIS — Z79899 Other long term (current) drug therapy: Secondary | ICD-10-CM | POA: Diagnosis not present

## 2020-10-03 DIAGNOSIS — D508 Other iron deficiency anemias: Secondary | ICD-10-CM

## 2020-10-03 DIAGNOSIS — Z880 Allergy status to penicillin: Secondary | ICD-10-CM | POA: Diagnosis not present

## 2020-10-03 DIAGNOSIS — I10 Essential (primary) hypertension: Secondary | ICD-10-CM | POA: Insufficient documentation

## 2020-10-03 DIAGNOSIS — Z823 Family history of stroke: Secondary | ICD-10-CM | POA: Diagnosis not present

## 2020-10-03 DIAGNOSIS — Z803 Family history of malignant neoplasm of breast: Secondary | ICD-10-CM | POA: Diagnosis not present

## 2020-10-03 DIAGNOSIS — D563 Thalassemia minor: Secondary | ICD-10-CM | POA: Insufficient documentation

## 2020-10-03 DIAGNOSIS — Z887 Allergy status to serum and vaccine status: Secondary | ICD-10-CM | POA: Diagnosis not present

## 2020-10-03 DIAGNOSIS — E119 Type 2 diabetes mellitus without complications: Secondary | ICD-10-CM | POA: Insufficient documentation

## 2020-10-03 DIAGNOSIS — Z8719 Personal history of other diseases of the digestive system: Secondary | ICD-10-CM | POA: Diagnosis not present

## 2020-10-03 DIAGNOSIS — Z800 Family history of malignant neoplasm of digestive organs: Secondary | ICD-10-CM | POA: Diagnosis not present

## 2020-10-03 DIAGNOSIS — Z794 Long term (current) use of insulin: Secondary | ICD-10-CM | POA: Insufficient documentation

## 2020-10-03 LAB — CBC WITH DIFFERENTIAL/PLATELET
Abs Immature Granulocytes: 0.08 10*3/uL — ABNORMAL HIGH (ref 0.00–0.07)
Basophils Absolute: 0.1 10*3/uL (ref 0.0–0.1)
Basophils Relative: 1 %
Eosinophils Absolute: 0.4 10*3/uL (ref 0.0–0.5)
Eosinophils Relative: 5 %
HCT: 35.2 % — ABNORMAL LOW (ref 36.0–46.0)
Hemoglobin: 10.3 g/dL — ABNORMAL LOW (ref 12.0–15.0)
Immature Granulocytes: 1 %
Lymphocytes Relative: 22 %
Lymphs Abs: 1.8 10*3/uL (ref 0.7–4.0)
MCH: 21.5 pg — ABNORMAL LOW (ref 26.0–34.0)
MCHC: 29.3 g/dL — ABNORMAL LOW (ref 30.0–36.0)
MCV: 73.5 fL — ABNORMAL LOW (ref 80.0–100.0)
Monocytes Absolute: 0.5 10*3/uL (ref 0.1–1.0)
Monocytes Relative: 6 %
Neutro Abs: 5.4 10*3/uL (ref 1.7–7.7)
Neutrophils Relative %: 65 %
Platelets: 346 10*3/uL (ref 150–400)
RBC: 4.79 MIL/uL (ref 3.87–5.11)
RDW: 16.4 % — ABNORMAL HIGH (ref 11.5–15.5)
WBC: 8.2 10*3/uL (ref 4.0–10.5)
nRBC: 0 % (ref 0.0–0.2)

## 2020-10-03 LAB — COMPREHENSIVE METABOLIC PANEL
ALT: 21 U/L (ref 0–44)
AST: 22 U/L (ref 15–41)
Albumin: 3.4 g/dL — ABNORMAL LOW (ref 3.5–5.0)
Alkaline Phosphatase: 98 U/L (ref 38–126)
Anion gap: 8 (ref 5–15)
BUN: 14 mg/dL (ref 8–23)
CO2: 26 mmol/L (ref 22–32)
Calcium: 8.8 mg/dL — ABNORMAL LOW (ref 8.9–10.3)
Chloride: 102 mmol/L (ref 98–111)
Creatinine, Ser: 1.05 mg/dL — ABNORMAL HIGH (ref 0.44–1.00)
GFR, Estimated: 57 mL/min — ABNORMAL LOW (ref >60)
Glucose, Bld: 164 mg/dL — ABNORMAL HIGH (ref 70–99)
Potassium: 4.2 mmol/L (ref 3.5–5.1)
Sodium: 136 mmol/L (ref 135–145)
Total Bilirubin: 0.6 mg/dL (ref 0.3–1.2)
Total Protein: 7 g/dL (ref 6.5–8.1)

## 2020-10-03 LAB — VITAMIN B12: Vitamin B-12: 302 pg/mL (ref 180–914)

## 2020-10-03 LAB — IRON AND TIBC
Iron: 61 ug/dL (ref 28–170)
Saturation Ratios: 19 % (ref 10.4–31.8)
TIBC: 320 ug/dL (ref 250–450)
UIBC: 259 ug/dL

## 2020-10-03 LAB — FOLATE: Folate: 5.4 ng/mL — ABNORMAL LOW (ref >5.9)

## 2020-10-03 LAB — LACTATE DEHYDROGENASE: LDH: 128 U/L (ref 98–192)

## 2020-10-03 LAB — VITAMIN D 25 HYDROXY (VIT D DEFICIENCY, FRACTURES): Vit D, 25-Hydroxy: 48.31 ng/mL (ref 30–100)

## 2020-10-03 LAB — FERRITIN: Ferritin: 113 ng/mL (ref 11–307)

## 2020-10-03 LAB — MAGNESIUM: Magnesium: 1.6 mg/dL — ABNORMAL LOW (ref 1.7–2.4)

## 2020-10-04 ENCOUNTER — Other Ambulatory Visit: Payer: Self-pay | Admitting: Family Medicine

## 2020-10-09 NOTE — Progress Notes (Signed)
[place], [place] [address]   CLINIC:  Medical Oncology/Hematology  PCP:  Fayrene Helper, MD [address], [address] [place] [address] [phone]   REASON FOR VISIT:  Follow-up for microcytic anemia  PRIOR THERAPY: B12 injections; IV Feraheme (last given in April 2017)  CURRENT THERAPY: Oral iron therapy  INTERVAL HISTORY:  [name] 70 y.o. female returns for routine follow-up of her microcytic anemia.  She was last seen by NP [name] on 03/30/2020.   Ms. Zilberman reports that she continues to take her iron tablets as prescribed, and they do not cause any constipation, diarrhea, or abdominal pain.  She denies any recent signs or symptoms of blood loss, such as epistaxis, hemoptysis, hematemesis, hematochezia, melena, or hematuria.  She denies chest pain on exertion, shortness of breath on exertion, presyncopal episodes, or palpitations.   She reports that overall she has been doing well since her last visit.  She has not had any recent surgeries, hospitalizations, infections, or changes in her overall health status.  She has 75% energy and 75% appetite, which she says is normal for her. She endorses that she is maintaining a stable weight.    REVIEW OF SYSTEMS:  Review of Systems  Constitutional:  Negative for appetite change, chills, diaphoresis, fatigue, fever and unexpected weight change.  HENT:   Negative for lump/mass and nosebleeds.   Eyes:  Negative for eye problems.  Respiratory:  Negative for cough, hemoptysis and shortness of breath.   Cardiovascular:  Negative for chest pain, leg swelling and palpitations.  Gastrointestinal:  Negative for abdominal pain, blood in stool, constipation, diarrhea, nausea and vomiting.  Genitourinary:  Negative for hematuria.   Skin: Negative.   Neurological:  Negative for dizziness, headaches and light-headedness.  Hematological:  Does not bruise/bleed easily.     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 Oneida Alar: sessile polyp in sigmoid colon, internal hemorrhoids, hyerplastic polyps   COLONOSCOPY N/A 05/15/2015   Procedure: COLONOSCOPY;  Surgeon: Danie Binder, MD;  [facility];  Service: Endoscopy;  Laterality: N/A;  0830   COLONOSCOPY W/ POLYPECTOMY  NOV 2008 [facility] ANEMIA   POLYP NO RETRIEVED   COLONOSCOPY W/ POLYPECTOMY  2006 DR. SMTH   POLYP?   COLONOSCOPY WITH PROPOFOL N/A 08/07/2020   Procedure: COLONOSCOPY WITH PROPOFOL;  Surgeon: Eloise Harman, DO;  [facility]: [facility];  Service: Endoscopy;  Laterality: N/A;  ASA II / AM procedure   ESOPHAGOGASTRODUODENOSCOPY  05/14/2011   Dr. Oneida Alar: sessile polyps in the cardia, mild gastritis. Chronic duodenitis consistent with peptic duodenitis, chronic active H.pylori gastritis.    ESOPHAGOGASTRODUODENOSCOPY N/A 05/15/2015   Procedure: ESOPHAGOGASTRODUODENOSCOPY (EGD);  Surgeon: Danie Binder, MD;  [facility];  Service: Endoscopy;  Laterality: N/A;   GIVENS CAPSULE STUDY N/A 06/02/2015   Procedure: GIVENS CAPSULE STUDY;  Surgeon: Carlyon Prows  Rexene Edison, MD;  [facility]: [facility];  Service: Endoscopy;  Laterality: N/A;  0700   POLYPECTOMY  08/07/2020   Procedure: POLYPECTOMY;  Surgeon: Eloise Harman, DO;  [facility];  Service: Endoscopy;;   TUBAL LIGATION     UPPER GASTROINTESTINAL ENDOSCOPY  NOV 2008 [facility]  ANEMIA   NL EXAM, urease neg     SOCIAL HISTORY:  Social History   Socioeconomic History   Marital status: Married    Spouse name: Not on file   Number of children: Not on file   Years of education: Not on file   Highest education level: Not on file  Occupational History   Not on file  Tobacco Use   Smoking status: Never   Smokeless tobacco: Never  Vaping Use   Vaping Use: Never used  Substance and Sexual Activity   Alcohol use: No   Drug use: No   Sexual activity: Not Currently  Other Topics Concern   Not on file  Social History Narrative   Not on file   Social Determinants of Health   Financial Resource Strain: Low Risk    Difficulty of Paying Living Expenses: Not hard at all  Food Insecurity: No Food Insecurity   Worried About Charity fundraiser in the Last Year: Never true   Ran Out of Food in the Last Year: Never true  Transportation Needs: No Transportation Needs   Lack of Transportation (Medical): No   Lack of Transportation (Non-Medical): No  Physical Activity: Insufficiently Active   Days of Exercise per Week: 3 days   Minutes of Exercise per Session: 30 min  Stress: No Stress Concern Present   Feeling of Stress : Not at all  Social Connections: Moderately Isolated   Frequency of Communication with Friends and Family: More than three times a week   Frequency of Social Gatherings with Friends and Family: More than three times a week   Attends Religious Services: More than 4 times per year   Active Member of [employer] or Organizations: No   Attends Archivist Meetings: Never   Marital Status: Widowed  Human resources officer Violence: Not At Risk   Fear of Current or Ex-Partner: No   Emotionally Abused: No   Physically Abused: No   Sexually Abused: No    FAMILY HISTORY:  Family History  Problem Relation Age of Onset   Heart disease Mother    Diabetes Mother    Hypertension Mother    Stroke Mother    Breast cancer Mother    Diabetes Father     Heart attack Father    Kidney disease Father    Hypertension Sister    Diabetes Sister    Diabetes Sister    Mental illness Sister    Hypertension Sister    Hepatitis C Sister    Mental illness Sister    Diabetes Sister    Hypertension Sister    Mental illness Sister    Colon cancer Sister 79   Diabetes Brother     CURRENT MEDICATIONS:  Outpatient Encounter Medications as of 10/10/2020  Medication Sig   Alcohol Swabs (B-D SINGLE USE SWABS REGULAR) PADS USE AS DIRECTED THREE TIMES DAILY   aspirin 81 MG tablet Take 81 mg by mouth daily.   Blood Glucose Calibration (TRUE METRIX LEVEL 1) Low SOLN    Calcium Citrate-Vitamin D 200-250 MG-UNIT TABS Take 1 tablet by mouth daily.   Cholecalciferol (VITAMIN D3) 2000 units TABS Take 2,000 Units by  mouth daily.   DROPLET PEN NEEDLES 31G X 8 MM MISC USE 1 PEN ONCE DAILY AS DIRECTED   glipiZIDE (GLUCOTROL) 10 MG tablet TAKE 1 TABLET TWICE DAILY BEFORE A MEAL (Patient taking differently: Take 10 mg by mouth 2 (two) times daily before a meal.)   glucose blood (TRUE METRIX BLOOD GLUCOSE TEST) test strip TEST BLOOD SUGAR THREE TIMES DAILY   LANTUS SOLOSTAR 100 UNIT/ML Solostar Pen INJECT 50 UNITS SUBCUTANEOUSLY ONCE DAILY AT 10PM   lisinopril-hydrochlorothiazide (ZESTORETIC) 20-12.5 MG tablet Take 1 tablet by mouth in the morning AND 0.5 tablets every evening.   metFORMIN (GLUCOPHAGE) 1000 MG tablet TAKE 1 TABLET TWICE DAILY WITH MEALS   Multiple Vitamin (MULTIVITAMINS PO) Take 1 tablet by mouth daily.   pravastatin (PRAVACHOL) 80 MG tablet TAKE 1 TABLET ONE TIME DAILY IN THE EVENING   TRUEplus Lancets 33G MISC CHECK BLOOD SUGAR THREE TIMES DAILY   No facility-administered encounter medications on file as of 10/10/2020.    ALLERGIES:  Allergies  Allergen Reactions   Tetanus Toxoids     Can't walk the next day(happened twice)    Penicillins Rash    Has patient had a PCN reaction causing immediate rash, facial/tongue/throat swelling, SOB or  lightheadedness with hypotension: Yes/No:[id number] Has patient had a PCN reaction causing severe rash involving mucus membranes or skin necrosis: Yes/No:[id number] rash Has patient had a PCN reaction that required hospitalization Yes/No:[id number] Has patient had a PCN reaction occurring within the last 10 years: Yes/No:[id number] If all of the above answers are NO, then may proceed with Cephalos     PHYSICAL EXAM:  ECOG PERFORMANCE STATUS: 0 - Asymptomatic  There were no vitals filed for this visit. There were no vitals filed for this visit. Physical Exam Constitutional:      Appearance: Normal appearance. She is obese.  HENT:     Head: Normocephalic and atraumatic.     Mouth/Throat:     Mouth: Mucous membranes are moist.  Eyes:     Extraocular Movements: Extraocular movements intact.     Pupils: Pupils are equal, round, and reactive to light.  Cardiovascular:     Rate and Rhythm: Normal rate and regular rhythm.     Pulses: Normal pulses.     Heart sounds: Normal heart sounds.  Pulmonary:     Effort: Pulmonary effort is normal.     Breath sounds: Normal breath sounds.  Abdominal:     General: Bowel sounds are normal.     Palpations: Abdomen is soft.     Tenderness: There is no abdominal tenderness.  Musculoskeletal:        General: No swelling.     Right lower leg: No edema.     Left lower leg: No edema.  Lymphadenopathy:     Cervical: No cervical adenopathy.  Skin:    General: Skin is warm and dry.  Neurological:     General: No focal deficit present.     Mental Status: She is alert and oriented to person, place, and time.  Psychiatric:        Mood and Affect: Mood normal.        Behavior: Behavior normal.     LABORATORY DATA:  I have reviewed the labs as listed.  CBC    Component Value Date/Time   WBC 8.2 10/03/2020 1048   RBC 4.79 10/03/2020 1048   HGB 10.3 (L) 10/03/2020 1048   HGB 10.1 (L) 08/14/2020 0914   HCT 35.2 (L) 10/03/2020 1048   HCT  33.8 (L) 08/14/2020 0914   PLT 346 10/03/2020 1048   PLT 382 08/14/2020 0914   MCV 73.5 (L) 10/03/2020 1048   MCV 71 (L) 08/14/2020 0914   MCH 21.5 (L) 10/03/2020 1048   MCHC 29.3 (L) 10/03/2020 1048   RDW 16.4 (H) 10/03/2020 1048   RDW 16.0 (H) 08/14/2020 0914   LYMPHSABS 1.8 10/03/2020 1048   LYMPHSABS 2.4 08/14/2020 0914   MONOABS 0.5 10/03/2020 1048   EOSABS 0.4 10/03/2020 1048   EOSABS 0.4 08/14/2020 0914   BASOSABS 0.1 10/03/2020 1048   BASOSABS 0.1 08/14/2020 0914   CMP Latest Ref Rng & Units 10/03/2020 08/14/2020 08/04/2020  Glucose 70 - 99 mg/dL 164(H) 116(H) 130(H)  BUN 8 - 23 mg/dL 14 18 18   Creatinine 0.44 - 1.00 mg/dL 1.05(H) 1.00 1.17(H)  Sodium 135 - 145 mmol/L 136 139 138  Potassium 3.5 - 5.1 mmol/L 4.2 4.4 4.1  Chloride 98 - 111 mmol/L 102 100 102  CO2 22 - 32 mmol/L 26 21 24   Calcium 8.9 - 10.3 mg/dL 8.8(L) 9.4 9.0  Total Protein 6.5 - 8.1 g/dL 7.0 6.5 -  Total Bilirubin 0.3 - 1.2 mg/dL 0.6 0.3 -  Alkaline Phos 38 - 126 U/L 98 124(H) -  AST 15 - 41 U/L 22 14 -  ALT 0 - 44 U/L 21 14 -    DIAGNOSTIC IMAGING:  I have independently reviewed the relevant imaging and discussed with the patient.  ASSESSMENT: 1.  Microcytic anemia with alpha thalassemia minor - Longstanding microcytic anemia dating back to before 2008, alpha thalassemia genotype study done on 08/17/2015 - Ongoing mild (Hgb > 10.0) microcytic anemia attributable to alpha thalassemia trait - She has previously required IV Feraheme (last given April 2017), currently on daily oral iron supplement - SPEP checked in 2017 was normal.  Hemoccult stool checked in 2012 was negative. - Most recent labs (10/03/2020) show Hgb 10.3/MCV 73.5; creatinine 1.05 (GFR 57), LDH normal, ferritin 113, iron saturation 19% - Her baseline hemoglobin ranges from 9.5-11.0  2.  Vitamin B12 deficiency - Patient has been receiving monthly B12 injections since February 2017 - Labs in November 2021 showed vitamin B12 level > 7500,  therefore injections have been held for the past 6 months - Most recent B12 (10/03/2020) was normalized at 302  3.  Folate deficiency - Labs on 10/03/2020 showed folate decreased at 5.4  4.  Health maintenance: -Patient's last mammogram was done on 02/24/2020 It showed B RADS category 1 negative.  She will have a repeat mammogram in October of 2022 - Patient's last colonoscopy was in 05/15/2015 it showed redundant L colon, diverticulosis, small internal hemorrhoids. -Patient's EGD was in 05/15/2015 which showed gastric polyps, GE junction stricture. - Patient had a capsule study 06/02/2015 which showed occasional erosion in the duodenum.  No masses, ulcers or AVMs seen.  Occasional lymphangectasia   PLAN:  1.  Microcytic anemia with alpha thalassemia minor - Most recent labs negative for iron deficiency - Hgb 10.3 is at patient's baseline in keeping with her alpha thalassemia trait - Continue oral iron supplementation, no indication for IV iron at this time - Repeat labs and RTC in 6 months  2.  Vitamin B12 deficiency - Vitamin B12 injections were held at last appointment due to markedly elevated B12 > 7500 - B12 checked before this appointment was 302 (10/03/2020) - We will start patient on a trial of oral vitamin B12 supplementation - Repeat B12/methylmalonic acid in 6 months  3.  Folate deficiency - Patient will start taking oral folate supplementation - Repeat folate acid in 6 months   PLAN SUMMARY & DISPOSITION: -- Start taking B12 and folate supplement -- Continue iron, multivitamin, and vitamin D -- RTC in 6 months with labs the week before  All questions were answered. The patient knows to call the clinic with any problems, questions or concerns.  Medical decision making: Low  Time spent on visit: I spent 15 minutes counseling the patient face to face. The total time spent in the appointment was 25 minutes and more than 50% was on counseling.   Harriett Rush, PA-C   10/10/20 11:52 AM

## 2020-10-10 ENCOUNTER — Inpatient Hospital Stay (HOSPITAL_BASED_OUTPATIENT_CLINIC_OR_DEPARTMENT_OTHER): Payer: Medicare HMO | Admitting: Physician Assistant

## 2020-10-10 ENCOUNTER — Other Ambulatory Visit: Payer: Self-pay

## 2020-10-10 VITALS — BP 98/67 | HR 89 | Temp 96.6°F | Resp 18 | Wt 234.1 lb

## 2020-10-10 DIAGNOSIS — D509 Iron deficiency anemia, unspecified: Secondary | ICD-10-CM | POA: Diagnosis not present

## 2020-10-10 DIAGNOSIS — D563 Thalassemia minor: Secondary | ICD-10-CM | POA: Diagnosis not present

## 2020-10-10 DIAGNOSIS — I10 Essential (primary) hypertension: Secondary | ICD-10-CM | POA: Diagnosis not present

## 2020-10-10 DIAGNOSIS — E669 Obesity, unspecified: Secondary | ICD-10-CM | POA: Diagnosis not present

## 2020-10-10 DIAGNOSIS — D508 Other iron deficiency anemias: Secondary | ICD-10-CM | POA: Diagnosis not present

## 2020-10-10 DIAGNOSIS — E538 Deficiency of other specified B group vitamins: Secondary | ICD-10-CM | POA: Diagnosis not present

## 2020-10-10 DIAGNOSIS — Z79899 Other long term (current) drug therapy: Secondary | ICD-10-CM | POA: Diagnosis not present

## 2020-10-10 DIAGNOSIS — E785 Hyperlipidemia, unspecified: Secondary | ICD-10-CM | POA: Diagnosis not present

## 2020-10-10 DIAGNOSIS — Z794 Long term (current) use of insulin: Secondary | ICD-10-CM | POA: Diagnosis not present

## 2020-10-10 DIAGNOSIS — E119 Type 2 diabetes mellitus without complications: Secondary | ICD-10-CM | POA: Diagnosis not present

## 2020-10-10 NOTE — Patient Instructions (Signed)
[place] at [facility] Discharge Instructions  You were seen today by Tarri Abernethy PA-C for your anemia.  Your blood levels are stable at your baseline.      LABS: Return in 6 months for repeat labs   MEDICATIONS:  - CONTINUE daily multivitamin - CONTINUE daily iron (ferrous sulfate) - START taking vitamin B12 (cyancobalamin) 1,000 mcg (a.k.a. 1 mg) daily - START taking folic acid supplement (over-the-counter) daily  FOLLOW-UP APPOINTMENT: Return in 6 months for follow up   Thank you for choosing [place] at [facility] to provide your oncology and hematology care.  To afford each patient quality time with our provider, please arrive at least 15 minutes before your scheduled appointment time.   If you have a lab appointment with the [facility] please come in thru the Main Entrance and check in at the main information desk.  You need to re-schedule your appointment should you arrive 10 or more minutes late.  We strive to give you quality time with our providers, and arriving late affects you and other patients whose appointments are after yours.  Also, if you no show three or more times for appointments you may be dismissed from the clinic at the providers discretion.     Again, thank you for choosing [facility].  Our hope is that these requests will decrease the amount of time that you wait before being seen by our physicians.       _____________________________________________________________  Should you have questions after your visit to [facility], please contact our office at [phone] and follow the prompts.  Our office hours are 8:00 a.m. and 4:30 p.m. Monday - Friday.  Please note that voicemails left after 4:00 p.m. may not be returned until the following business day.  We are closed weekends and major holidays.  You do have access to a nurse 24-7, just call the main number to the clinic  [phone] and do not press any options, hold on the line and a nurse will answer the phone.    For prescription refill requests, have your pharmacy contact our office and allow 72 hours.    Due to Covid, you will need to wear a mask upon entering the hospital. If you do not have a mask, a mask will be given to you at the Main Entrance upon arrival. For doctor visits, patients may have 1 support person age 23 or older with them. For treatment visits, patients can not have anyone with them due to social distancing guidelines and our immunocompromised population.

## 2020-10-11 ENCOUNTER — Other Ambulatory Visit: Payer: Self-pay | Admitting: Family Medicine

## 2020-10-12 ENCOUNTER — Other Ambulatory Visit: Payer: Self-pay | Admitting: Family Medicine

## 2020-10-16 ENCOUNTER — Telehealth: Payer: Self-pay

## 2020-10-16 ENCOUNTER — Other Ambulatory Visit: Payer: Self-pay

## 2020-10-16 MED ORDER — TRUEPLUS LANCETS 33G MISC: 0 refills | Status: DC

## 2020-10-16 NOTE — Telephone Encounter (Signed)
Semglee has already been sent. Lancets sent.

## 2020-10-16 NOTE — Telephone Encounter (Signed)
Patient called need refill on lancets Semglee, 100 unit to [facility]

## 2020-11-12 ENCOUNTER — Other Ambulatory Visit: Payer: Self-pay | Admitting: Family Medicine

## 2020-12-13 ENCOUNTER — Other Ambulatory Visit: Payer: Self-pay | Admitting: Nurse Practitioner

## 2020-12-13 DIAGNOSIS — I10 Essential (primary) hypertension: Secondary | ICD-10-CM

## 2020-12-28 ENCOUNTER — Ambulatory Visit (INDEPENDENT_AMBULATORY_CARE_PROVIDER_SITE_OTHER): Payer: Medicare HMO

## 2020-12-28 ENCOUNTER — Encounter: Payer: Medicare HMO | Admitting: Family Medicine

## 2020-12-28 ENCOUNTER — Other Ambulatory Visit: Payer: Self-pay

## 2020-12-28 DIAGNOSIS — Z0000 Encounter for general adult medical examination without abnormal findings: Secondary | ICD-10-CM

## 2020-12-28 NOTE — Progress Notes (Signed)
Subjective:   SION BEEKER is a 70 y.o. female who presents for Medicare Annual (Subsequent) preventive examination.  Review of Systems       Cardiac Risk Factors include: diabetes mellitus;dyslipidemia;hypertension;obesity (BMI >30kg/m2)     Objective:    There were no vitals filed for this visit. There is no height or weight on file to calculate BMI.  Advanced Directives 12/28/2020 10/10/2020 08/07/2020 03/30/2020 03/22/2020 02/23/2020 01/24/2020  Does Patient Have a Medical Advance Directive? No No No No No No No  Would patient like information on creating a medical advance directive? Yes (ED - Information included in AVS) No - Patient declined No - Patient declined No - Patient declined No - Patient declined No - Patient declined No - Patient declined  Pre-existing out of facility DNR order (yellow form or pink MOST form) - - - - - - -    Current Medications (verified) Outpatient Encounter Medications as of 12/28/2020  Medication Sig   Alcohol Swabs (B-D SINGLE USE SWABS REGULAR) PADS USE AS DIRECTED THREE TIMES DAILY   aspirin 81 MG tablet Take 81 mg by mouth daily.   Blood Glucose Calibration (TRUE METRIX LEVEL 1) Low SOLN    Calcium Citrate-Vitamin D 200-250 MG-UNIT TABS Take 1 tablet by mouth daily.   Cholecalciferol (VITAMIN D3) 2000 units TABS Take 2,000 Units by mouth daily.   DROPLET PEN NEEDLES 31G X 8 MM MISC USE 1 PEN ONCE DAILY AS DIRECTED   glipiZIDE (GLUCOTROL) 10 MG tablet TAKE 1 TABLET TWICE DAILY BEFORE MEALS   LANTUS SOLOSTAR 100 UNIT/ML Solostar Pen INJECT 50 UNITS SUBCUTANEOUSLY ONCE DAILY AT 10 PM   lisinopril-hydrochlorothiazide (ZESTORETIC) 20-12.5 MG tablet TAKE 1 TABLET IN THE MORNING AND TAKE 1/2 TABLET EVERY EVENING (DOSE INCREASE)   metFORMIN (GLUCOPHAGE) 1000 MG tablet TAKE 1 TABLET TWICE DAILY WITH MEALS   Multiple Vitamin (MULTIVITAMINS PO) Take 1 tablet by mouth daily.   pravastatin (PRAVACHOL) 80 MG tablet TAKE 1 TABLET ONE TIME DAILY IN THE  EVENING   TRUE METRIX BLOOD GLUCOSE TEST test strip TEST BLOOD SUGAR THREE TIMES DAILY   TRUEplus Lancets 33G MISC CHECK BLOOD SUGAR THREE TIMES DAILY   No facility-administered encounter medications on file as of 12/28/2020.    Allergies (verified) Tetanus toxoids and Penicillins   History: Past Medical History:  Diagnosis Date   Alpha-0- thalassemia trait/carrier 05/18/2015   2013: TCS/EGD 2017: TCS/EGD HYPERPLASTIC GASTRIC POLYPS, LYMPHOCYTIC GASTRITIS    B12 deficiency 06/16/2015   Colon polyps    Diabetes mellitus, type 2 ([place])    Hyperlipidemia    Hypertension    Microcytic anemia 05/18/2015   2013: TCS/EGD 2017: TCS/EGD HYPERPLASTIC GASTRIC POLYPS, LYMPHOCYTIC GASTRITIS    Obesity    Past Surgical History:  Procedure Laterality Date   bilateral tubal ligation  1979   CHOLECYSTECTOMY  2001   ACUTE CHOLECYSTITIS/GALLSTONES   COLONOSCOPY  05/14/2011   Dr. Oneida Alar: sessile polyp in sigmoid colon, internal hemorrhoids, hyerplastic polyps   COLONOSCOPY N/A 05/15/2015   Procedure: COLONOSCOPY;  Surgeon: Danie Binder, MD;  [facility]: [facility];  Service: Endoscopy;  Laterality: N/A;  0830   COLONOSCOPY W/ POLYPECTOMY  NOV 2008 [facility] ANEMIA   POLYP NO RETRIEVED   COLONOSCOPY W/ POLYPECTOMY  2006 DR. SMTH   POLYP?   COLONOSCOPY WITH PROPOFOL N/A 08/07/2020   Procedure: COLONOSCOPY WITH PROPOFOL;  Surgeon: Eloise Harman, DO;  [facility]: [facility];  Service: Endoscopy;  Laterality: N/A;  ASA II /  AM procedure   ESOPHAGOGASTRODUODENOSCOPY  05/14/2011   Dr. Oneida Alar: sessile polyps in the cardia, mild gastritis. Chronic duodenitis consistent with peptic duodenitis, chronic active H.pylori gastritis.    ESOPHAGOGASTRODUODENOSCOPY N/A 05/15/2015   Procedure: ESOPHAGOGASTRODUODENOSCOPY (EGD);  Surgeon: Danie Binder, MD;  [facility]: [facility];  Service: Endoscopy;  Laterality: N/A;   GIVENS CAPSULE STUDY N/A 06/02/2015   Procedure: GIVENS CAPSULE STUDY;  Surgeon: Danie Binder, MD;   [facility]: [facility];  Service: Endoscopy;  Laterality: N/A;  0700   POLYPECTOMY  08/07/2020   Procedure: POLYPECTOMY;  Surgeon: Eloise Harman, DO;  Location: [facility];  Service: Endoscopy;;   TUBAL LIGATION     UPPER GASTROINTESTINAL ENDOSCOPY  NOV 2008 [facility] ANEMIA   NL EXAM, urease neg   Family History  Problem Relation Age of Onset   Heart disease Mother    Diabetes Mother    Hypertension Mother    Stroke Mother    Breast cancer Mother    Diabetes Father    Heart attack Father    Kidney disease Father    Hypertension Sister    Diabetes Sister    Diabetes Sister    Mental illness Sister    Hypertension Sister    Hepatitis C Sister    Mental illness Sister    Diabetes Sister    Hypertension Sister    Mental illness Sister    Colon cancer Sister 65   Diabetes Brother    Social History   Socioeconomic History   Marital status: Married    Spouse name: Not on file   Number of children: Not on file   Years of education: Not on file   Highest education level: Not on file  Occupational History   Not on file  Tobacco Use   Smoking status: Never   Smokeless tobacco: Never  Vaping Use   Vaping Use: Never used  Substance and Sexual Activity   Alcohol use: No   Drug use: No   Sexual activity: Not Currently  Other Topics Concern   Not on file  Social History Narrative   Not on file   Social Determinants of Health   Financial Resource Strain: Low Risk    Difficulty of Paying Living Expenses: Not hard at all  Food Insecurity: No Food Insecurity   Worried About Charity fundraiser in the Last Year: Never true   [place] in the Last Year: Never true  Transportation Needs: No Transportation Needs   Lack of Transportation (Medical): No   Lack of Transportation (Non-Medical): No  Physical Activity: Not on file  Stress: No Stress Concern Present   Feeling of Stress : Not at all  Social Connections: Moderately Isolated   Frequency of Communication with  Friends and Family: More than three times a week   Frequency of Social Gatherings with Friends and Family: More than three times a week   Attends Religious Services: More than 4 times per year   Active Member of [employer] or Organizations: No   Attends Archivist Meetings: Never   Marital Status: Widowed    Tobacco Counseling Counseling given: Not Answered   Clinical Intake:  Pre-visit preparation completed: Yes  Pain : No/denies pain     Nutritional Status: BMI > 30  Obese Diabetes: Yes CBG done?: No Did pt. bring in CBG monitor from home?: No  How often do you need to have someone help you when you read instructions, pamphlets, or  other written materials from your doctor or pharmacy?: 1 - Never What is the last grade level you completed in school?: 12th  Diabetic? Nutrition Risk Assessment:  Has the patient had any N/V/D within the last 2 months?  No  Does the patient have any non-healing wounds?  No  Has the patient had any unintentional weight loss or weight gain?  No   Diabetes:  Is the patient diabetic?  Yes  If diabetic, was a CBG obtained today?  No  Did the patient bring in their glucometer from home?  No  How often do you monitor your CBG's? daily.   Financial Strains and Diabetes Management:  Are you having any financial strains with the device, your supplies or your medication? No .  Does the patient want to be seen by Chronic Care Management for management of their diabetes?  No  Would the patient like to be referred to a Nutritionist or for Diabetic Management?  No   Diabetic Exams:  Diabetic Eye Exam: Completed yes. Overdue for diabetic eye exam. Pt has been advised about the importance in completing this exam. A referral has been placed today. Message sent to referral coordinator for scheduling purposes. Advised pt to expect a call from office referred to regarding appt.  Diabetic Foot Exam: Completed 12/16/19. Pt has been advised about the  importance in completing this exam. Pt is scheduled for diabetic foot exam on at next visit   Interpreter Needed?: No      Activities of Daily Living In your present state of health, do you have any difficulty performing the following activities: 12/28/2020  Hearing? N  Vision? N  Difficulty concentrating or making decisions? N  Walking or climbing stairs? N  Dressing or bathing? N  Doing errands, shopping? N  Preparing Food and eating ? N  Using the Toilet? N  In the past six months, have you accidently leaked urine? N  Do you have problems with loss of bowel control? N  Managing your Medications? N  Managing your Finances? N  Housekeeping or managing your Housekeeping? N  Some recent data might be hidden    Patient Care Team: Fayrene Helper, MD as PCP - General Fields, Marga Melnick, MD (Inactive) as Consulting Physician (Gastroenterology) Whitney Muse, Kelby Fam, MD (Inactive) as Consulting Physician (Hematology and Oncology) Eloise Harman, DO as Consulting Physician (Internal Medicine)  Indicate any recent Medical Services you may have received from other than [facility] providers in the past year (date may be approximate).     Assessment:   This is a routine wellness examination for [place].  Hearing/Vision screen No results found.  Dietary issues and exercise activities discussed: Current Exercise Habits: Home exercise routine, Time (Minutes): 30, Frequency (Times/Week): 3, Weekly Exercise (Minutes/Week): 90, Intensity: Mild   Goals Addressed             This Visit's Progress    Increase physical activity   On track    Start walking for 30 mins/5 days a week      Patient Stated   On track    Would like to continue to be active as she takes care of grandkids at this time        Depression Screen PHQ 2/9 Scores 12/28/2020 12/28/2020 08/16/2020 07/12/2020 05/04/2020 12/28/2019 12/16/2019  PHQ - 2 Score 0 0 0 0 0 0 0  PHQ- 9 Score - - - - - - -    Fall Risk Fall Risk   12/28/2020 08/16/2020  07/12/2020 05/04/2020 12/28/2019  Falls in the past year? 0 0 0 0 0  Number falls in past yr: 0 0 0 0 0  Injury with Fall? 0 0 0 0 0  Risk for fall due to : - No Fall Risks No Fall Risks - No Fall Risks  Follow up - Falls evaluation completed Falls evaluation completed - Falls evaluation completed    FALL RISK PREVENTION PERTAINING TO THE HOME:  Any stairs in or around the home? No  If so, are there any without handrails? No  Home free of loose throw rugs in walkways, pet beds, electrical cords, etc? Yes  Adequate lighting in your home to reduce risk of falls? Yes   ASSISTIVE DEVICES UTILIZED TO PREVENT FALLS:  Life alert? No  Use of a cane, walker or w/c? No  Grab bars in the bathroom? No  Shower chair or bench in shower? No  Elevated toilet seat or a handicapped toilet? Yes   TIMED UP AND GO:  Was the test performed? No .  Length of time to ambulate 10 feet:  sec.     Cognitive Function: MMSE - Mini Mental State Exam 12/28/2019  Not completed: Unable to complete     6CIT Screen 12/28/2020 12/28/2019 12/02/2018 11/25/2017 11/04/2016  What Year? 0 points 0 points 0 points 0 points 0 points  What month? 0 points 0 points 0 points 0 points 0 points  What time? 0 points 0 points 0 points 0 points 0 points  Count back from 20 0 points 0 points 0 points 0 points 0 points  Months in reverse 0 points 0 points 0 points 2 points 0 points  Repeat phrase 0 points 0 points 0 points - 0 points  Total Score 0 0 0 - 0    Immunizations Immunization History  Administered Date(s) Administered   Fluad Quad(high Dose 65+) 01/11/2019   Influenza Split 02/14/2011, 03/18/2012   Influenza Whole 02/03/2007, 01/25/2008, 01/20/2009, 01/04/2010   Influenza, High Dose Seasonal PF 07/01/2018   Influenza,inj,Quad PF,6+ Mos 02/18/2013, 03/14/2014, 01/12/2015, 02/22/2016, 01/27/2017, 12/18/2017, 12/16/2019   Moderna Sars-Covid-2 Vaccination 06/11/2019, 07/09/2019, 02/21/2020    Pneumococcal Conjugate-13 07/13/2014   Pneumococcal Polysaccharide-23 09/14/2009, 09/11/2015   Zoster, Live 10/16/2010    TDAP status: Up to date  Flu Vaccine status: Due, Education has been provided regarding the importance of this vaccine. Advised may receive this vaccine at local pharmacy or Health Dept. Aware to provide a copy of the vaccination record if obtained from local pharmacy or Health Dept. Verbalized acceptance and understanding.  Pneumococcal vaccine status: Up to date  Covid-19 vaccine status: Completed vaccines  Qualifies for Shingles Vaccine? Yes   Zostavax completed No   Shingrix Completed?: No.    Education has been provided regarding the importance of this vaccine. Patient has been advised to call insurance company to determine out of pocket expense if they have not yet received this vaccine. Advised may also receive vaccine at local pharmacy or Health Dept. Verbalized acceptance and understanding.  Screening Tests Health Maintenance  Topic Date Due   Zoster Vaccines- Shingrix (1 of 2) Never done   COVID-19 Vaccine (4 - Booster for Moderna series) 05/23/2020   INFLUENZA VACCINE  11/27/2020   FOOT EXAM  12/15/2020   HEMOGLOBIN A1C  02/13/2021   OPHTHALMOLOGY EXAM  02/27/2021   MAMMOGRAM  02/22/2022   COLONOSCOPY (Pts 45-71yr Insurance coverage will need to be confirmed)  08/07/2025   DEXA SCAN  Completed   Hepatitis C  Screening  Completed   PNA vac Low Risk Adult  Completed   HPV VACCINES  Aged Out   TETANUS/TDAP  Discontinued    Health Maintenance  Health Maintenance Due  Topic Date Due   Zoster Vaccines- Shingrix (1 of 2) Never done   COVID-19 Vaccine (4 - Booster for Moderna series) 05/23/2020   INFLUENZA VACCINE  11/27/2020   FOOT EXAM  12/15/2020    Colorectal cancer screening: Type of screening: Colonoscopy. Completed 08/07/20. Repeat every 5 years  Mammogram status: Ordered scheduled for 02/26/21. Pt provided with contact info and advised to  call to schedule appt.   Bone Density status: Completed 2017. Results reflect: Bone density results: NORMAL. Repeat every 5 years.  Lung Cancer Screening: (Low Dose CT Chest recommended if Age 41-80 years, 30 pack-year currently smoking OR have quit w/in 15years.) does not qualify.   Lung Cancer Screening Referral: n/a  Additional Screening:  Hepatitis C Screening: does not qualify; Completed   Vision Screening: Recommended annual ophthalmology exams for early detection of glaucoma and other disorders of the eye. Is the patient up to date with their annual eye exam?  Yes  Who is the provider or what is the name of the office in which the patient attends annual eye exams? [facility] Oct 25[facility] If pt is not established with a provider, would they like to be referred to a provider to establish care? No .   Dental Screening: Recommended annual dental exams for proper oral hygiene  Community Resource Referral / Chronic Care Management: CRR required this visit?  No   CCM required this visit?  No      Plan:     I have personally reviewed and noted the following in the patient's chart:   Medical and social history Use of alcohol, tobacco or illicit drugs  Current medications and supplements including opioid prescriptions.  Functional ability and status Nutritional status Physical activity Advanced directives List of other physicians Hospitalizations, surgeries, and ER visits in previous 12 months Vitals Screenings to include cognitive, depression, and falls Referrals and appointments  In addition, I have reviewed and discussed with patient certain preventive protocols, quality metrics, and best practice recommendations. A written personalized care plan for preventive services as well as general preventive health recommendations were provided to patient.     Kate Sable, LPN, LPN   [id number]   Nurse Notes: Visit performed by telephone. Patient at home and supervising provider  in the office. Patient consents to telephone visit. Time spent with pt 25 mins.

## 2020-12-28 NOTE — Patient Instructions (Addendum)
Helen Mahoney , Thank you for taking time to come for your Medicare Wellness Visit. I appreciate your ongoing commitment to your health goals. Please review the following plan we discussed and let me know if I can assist you in the future.   Screening recommendations/referrals: Colonoscopy: Up to date- due again in 2027 Mammogram: Scheduled for 02/26/21 Bone Density: Declined Recommended yearly ophthalmology/optometry visit for glaucoma screening and checkup Recommended yearly dental visit for hygiene and checkup  Vaccinations: Influenza vaccine: Will come by office and have done  Pneumococcal vaccine: up to date Tdap vaccine: up to date Shingles vaccine: Declined   Advanced directives: Has been given forms  Conditions/risks identified: none  Next appointment: 02/07/21 9:40pm   Preventive Care 70 Years and Older, Female Preventive care refers to lifestyle choices and visits with your health care provider that can promote health and wellness. What does preventive care include? A yearly physical exam. This is also called an annual well check. Dental exams once or twice a year. Routine eye exams. Ask your health care provider how often you should have your eyes checked. Personal lifestyle choices, including: Daily care of your teeth and gums. Regular physical activity. Eating a healthy diet. Avoiding tobacco and drug use. Limiting alcohol use. Practicing safe sex. Taking low-dose aspirin every day. Taking vitamin and mineral supplements as recommended by your health care provider. What happens during an annual well check? The services and screenings done by your health care provider during your annual well check will depend on your age, overall health, lifestyle risk factors, and family history of disease. Counseling  Your health care provider may ask you questions about your: Alcohol use. Tobacco use. Drug use. Emotional well-being. Home and relationship well-being. Sexual  activity. Eating habits. History of falls. Memory and ability to understand (cognition). Work and work Statistician. Reproductive health. Screening  You may have the following tests or measurements: Height, weight, and BMI. Blood pressure. Lipid and cholesterol levels. These may be checked every 5 years, or more frequently if you are over 89 years old. Skin check. Lung cancer screening. You may have this screening every year starting at age 61 if you have a 30-pack-year history of smoking and currently smoke or have quit within the past 15 years. Fecal occult blood test (FOBT) of the stool. You may have this test every year starting at age 96. Flexible sigmoidoscopy or colonoscopy. You may have a sigmoidoscopy every 5 years or a colonoscopy every 10 years starting at age 5. Hepatitis C blood test. Hepatitis B blood test. Sexually transmitted disease (STD) testing. Diabetes screening. This is done by checking your blood sugar (glucose) after you have not eaten for a while (fasting). You may have this done every 1-3 years. Bone density scan. This is done to screen for osteoporosis. You may have this done starting at age 50. Mammogram. This may be done every 1-2 years. Talk to your health care provider about how often you should have regular mammograms. Talk with your health care provider about your test results, treatment options, and if necessary, the need for more tests. Vaccines  Your health care provider may recommend certain vaccines, such as: Influenza vaccine. This is recommended every year. Tetanus, diphtheria, and acellular pertussis (Tdap, Td) vaccine. You may need a Td booster every 10 years. Zoster vaccine. You may need this after age 66. Pneumococcal 13-valent conjugate (PCV13) vaccine. One dose is recommended after age 21. Pneumococcal polysaccharide (PPSV23) vaccine. One dose is recommended after age 1. Talk  to your health care provider about which screenings and vaccines  you need and how often you need them. This information is not intended to replace advice given to you by your health care provider. Make sure you discuss any questions you have with your health care provider. Document Released: 05/12/2015 Document Revised: 01/03/2016 Document Reviewed: 02/14/2015 Elsevier Interactive Patient Education  2017  Prevention in the Home Falls can cause injuries. They can happen to people of all ages. There are many things you can do to make your home safe and to help prevent falls. What can I do on the outside of my home? Regularly fix the edges of walkways and driveways and fix any cracks. Remove anything that might make you trip as you walk through a door, such as a raised step or threshold. Trim any bushes or trees on the path to your home. Use bright outdoor lighting. Clear any walking paths of anything that might make someone trip, such as rocks or tools. Regularly check to see if handrails are loose or broken. Make sure that both sides of any steps have handrails. Any raised decks and porches should have guardrails on the edges. Have any leaves, snow, or ice cleared regularly. Use sand or salt on walking paths during winter. Clean up any spills in your garage right away. This includes oil or grease spills. What can I do in the bathroom? Use night lights. Install grab bars by the toilet and in the tub and shower. Do not use towel bars as grab bars. Use non-skid mats or decals in the tub or shower. If you need to sit down in the shower, use a plastic, non-slip stool. Keep the floor dry. Clean up any water that spills on the floor as soon as it happens. Remove soap buildup in the tub or shower regularly. Attach bath mats securely with double-sided non-slip rug tape. Do not have throw rugs and other things on the floor that can make you trip. What can I do in the bedroom? Use night lights. Make sure that you have a light by your bed that  is easy to reach. Do not use any sheets or blankets that are too big for your bed. They should not hang down onto the floor. Have a firm chair that has side arms. You can use this for support while you get dressed. Do not have throw rugs and other things on the floor that can make you trip. What can I do in the kitchen? Clean up any spills right away. Avoid walking on wet floors. Keep items that you use a lot in easy-to-reach places. If you need to reach something above you, use a strong step stool that has a grab bar. Keep electrical cords out of the way. Do not use floor polish or wax that makes floors slippery. If you must use wax, use non-skid floor wax. Do not have throw rugs and other things on the floor that can make you trip. What can I do with my stairs? Do not leave any items on the stairs. Make sure that there are handrails on both sides of the stairs and use them. Fix handrails that are broken or loose. Make sure that handrails are as long as the stairways. Check any carpeting to make sure that it is firmly attached to the stairs. Fix any carpet that is loose or worn. Avoid having throw rugs at the top or bottom of the stairs. If you do have throw rugs,  attach them to the floor with carpet tape. Make sure that you have a light switch at the top of the stairs and the bottom of the stairs. If you do not have them, ask someone to add them for you. What else can I do to help prevent falls? Wear shoes that: Do not have high heels. Have rubber bottoms. Are comfortable and fit you well. Are closed at the toe. Do not wear sandals. If you use a stepladder: Make sure that it is fully opened. Do not climb a closed stepladder. Make sure that both sides of the stepladder are locked into place. Ask someone to hold it for you, if possible. Clearly mark and make sure that you can see: Any grab bars or handrails. First and last steps. Where the edge of each step is. Use tools that help you  move around (mobility aids) if they are needed. These include: Canes. Walkers. Scooters. Crutches. Turn on the lights when you go into a dark area. Replace any light bulbs as soon as they burn out. Set up your furniture so you have a clear path. Avoid moving your furniture around. If any of your floors are uneven, fix them. If there are any pets around you, be aware of where they are. Review your medicines with your doctor. Some medicines can make you feel dizzy. This can increase your chance of falling. Ask your doctor what other things that you can do to help prevent falls. This information is not intended to replace advice given to you by your health care provider. Make sure you discuss any questions you have with your health care provider. Document Released: 02/09/2009 Document Revised: 09/21/2015 Document Reviewed: 05/20/2014 Elsevier Interactive Patient Education  2017 [employer].

## 2021-01-03 ENCOUNTER — Ambulatory Visit (INDEPENDENT_AMBULATORY_CARE_PROVIDER_SITE_OTHER): Payer: Medicare HMO

## 2021-01-03 ENCOUNTER — Other Ambulatory Visit: Payer: Self-pay

## 2021-01-03 DIAGNOSIS — Z23 Encounter for immunization: Secondary | ICD-10-CM

## 2021-01-24 ENCOUNTER — Other Ambulatory Visit: Payer: Self-pay | Admitting: Family Medicine

## 2021-02-01 DIAGNOSIS — E785 Hyperlipidemia, unspecified: Secondary | ICD-10-CM | POA: Diagnosis not present

## 2021-02-01 DIAGNOSIS — E1159 Type 2 diabetes mellitus with other circulatory complications: Secondary | ICD-10-CM | POA: Diagnosis not present

## 2021-02-01 DIAGNOSIS — I10 Essential (primary) hypertension: Secondary | ICD-10-CM | POA: Diagnosis not present

## 2021-02-02 LAB — CMP14+EGFR
ALT: 14 IU/L (ref 0–32)
AST: 20 IU/L (ref 0–40)
Albumin/Globulin Ratio: 1.4 (ref 1.2–2.2)
Albumin: 3.8 g/dL (ref 3.8–4.8)
Alkaline Phosphatase: 139 IU/L — ABNORMAL HIGH (ref 44–121)
BUN/Creatinine Ratio: 14 (ref 12–28)
BUN: 17 mg/dL (ref 8–27)
Bilirubin Total: 0.2 mg/dL (ref 0.0–1.2)
CO2: 22 mmol/L (ref 20–29)
Calcium: 9.1 mg/dL (ref 8.7–10.3)
Chloride: 103 mmol/L (ref 96–106)
Creatinine, Ser: 1.19 mg/dL — ABNORMAL HIGH (ref 0.57–1.00)
Globulin, Total: 2.7 g/dL (ref 1.5–4.5)
Glucose: 60 mg/dL — ABNORMAL LOW (ref 70–99)
Potassium: 4.2 mmol/L (ref 3.5–5.2)
Sodium: 139 mmol/L (ref 134–144)
Total Protein: 6.5 g/dL (ref 6.0–8.5)
eGFR: 49 mL/min/{1.73_m2} — ABNORMAL LOW (ref >59)

## 2021-02-02 LAB — HEMOGLOBIN A1C
Est. average glucose Bld gHb Est-mCnc: 163 mg/dL
Hgb A1c MFr Bld: 7.3 % — ABNORMAL HIGH (ref 4.8–5.6)

## 2021-02-02 LAB — LIPID PANEL
Chol/HDL Ratio: 3 ratio (ref 0.0–4.4)
Cholesterol, Total: 158 mg/dL (ref 100–199)
HDL: 52 mg/dL (ref >39)
LDL Chol Calc (NIH): 92 mg/dL (ref 0–99)
Triglycerides: 70 mg/dL (ref 0–149)
VLDL Cholesterol Cal: 14 mg/dL (ref 5–40)

## 2021-02-07 ENCOUNTER — Ambulatory Visit (INDEPENDENT_AMBULATORY_CARE_PROVIDER_SITE_OTHER): Payer: Medicare HMO | Admitting: Family Medicine

## 2021-02-07 ENCOUNTER — Encounter: Payer: Self-pay | Admitting: Family Medicine

## 2021-02-07 ENCOUNTER — Other Ambulatory Visit: Payer: Self-pay

## 2021-02-07 VITALS — BP 158/78 | HR 62 | Resp 19 | Ht 66.0 in | Wt 238.0 lb

## 2021-02-07 DIAGNOSIS — I152 Hypertension secondary to endocrine disorders: Secondary | ICD-10-CM

## 2021-02-07 DIAGNOSIS — Z0000 Encounter for general adult medical examination without abnormal findings: Secondary | ICD-10-CM

## 2021-02-07 DIAGNOSIS — E1159 Type 2 diabetes mellitus with other circulatory complications: Secondary | ICD-10-CM | POA: Diagnosis not present

## 2021-02-07 DIAGNOSIS — I10 Essential (primary) hypertension: Secondary | ICD-10-CM | POA: Diagnosis not present

## 2021-02-07 MED ORDER — EMPAGLIFLOZIN 10 MG PO TABS: 10.0000 mg | ORAL_TABLET | Freq: Every day | ORAL | 1 refills | Status: DC

## 2021-02-07 MED ORDER — LISINOPRIL-HYDROCHLOROTHIAZIDE 20-12.5 MG PO TABS: ORAL_TABLET | ORAL | 3 refills | Status: DC

## 2021-02-07 NOTE — Assessment & Plan Note (Signed)

## 2021-02-07 NOTE — Assessment & Plan Note (Addendum)
Helen Mahoney is reminded of the importance of commitment to daily physical activity for 30 minutes or more, as able and the need to limit carbohydrate intake to 30 to 60 grams per meal to help with blood sugar control.   The need to take medication as prescribed, test blood sugar as directed, and to call between visits if there is a concern that blood sugar is uncontrolled is also discussed.  Jardiance 10 mg daily added  Helen Mahoney is reminded of the importance of daily foot exam, annual eye examination, and good blood sugar, blood pressure and cholesterol control.  Diabetic Labs Latest Ref Rng & Units 02/01/2021 10/03/2020 08/14/2020 08/04/2020 07/07/2020  HbA1c 4.8 - 5.6 % 7.3(H) - 7.4(H) - -  Microalbumin mg/dL - - - - -  Micro/Creat Ratio 0 - 29 mg/g creat - - 19 - -  Chol 100 - 199 mg/dL 158 - 173 - -  HDL >39 mg/dL 52 - 47 - -  Calc LDL 0 - 99 mg/dL 92 - 108(H) - -  Triglycerides 0 - 149 mg/dL 70 - 97 - -  Creatinine 0.57 - 1.00 mg/dL 1.19(H) 1.05(H) 1.00 1.17(H) 1.07(H)   BP/Weight 02/07/2021 10/10/2020 08/16/2020 08/07/2020 07/12/2020 0/09/3014 0/04/930  Systolic BP 355 98 732 99 202 - 542  Diastolic BP 78 67 75 65 80 - 87  Wt. (Lbs) 238 234.1 235 230 238 241.6 238.12  BMI 38.41 36.67 36.81 36.02 37.28 37.84 35.16   Foot/eye exam completion dates Latest Ref Rng & Units 02/07/2021 12/16/2019  Eye Exam No Retinopathy - -  Foot exam Order - - -  Foot Form Completion - Done Done

## 2021-02-07 NOTE — Patient Instructions (Signed)
Follow-up in 6 to 10 weeks reevaluate blood pressure, call if you need me sooner.  Please re evaluate and document vision with correction ( pt has glasses)  New higher dose of lisinopril HCTZ is 2 tablets 1 time daily as your blood pressure is still too high.  New medication for diabetes management is Jardiance 10 mg 1 tablet daily.  You are referred to cardiology for evaluation for heart disease including an echocardiogram due to longstanding hypertension.  Continue to work on reducing sodas and sweets and increasing physical activity to improve both your high blood pressure as well as diabetes.  Please choose appropriate footwear based on shoe foot shape with bunions and corns otherwise your foot exam is normal.  Thanks for choosing [facility], we consider it a privelige to serve you.

## 2021-02-07 NOTE — Progress Notes (Signed)
Helen Mahoney     MRN: [id number]      DOB: [birth year]  HPI: Patient is in for annual physical exam.uncontrolled HTN and diabetes are addressed at the visit. Recent labs,  are reviewed. Immunization is reviewed , and  updated if needed.   PE: BP (!) 158/78   Pulse 62   Resp 19   Ht 5\' 6"  (1.676 m)   Wt 238 lb (108 kg)   SpO2 94%   BMI 38.41 kg/m   Pleasant  female, alert and oriented x 3, in no cardio-pulmonary distress. Afebrile. HEENT No facial trauma or asymetry. Sinuses non tender.  Extra occullar muscles intact.. External ears normal, . Neck: supple, no adenopathy,JVD or thyromegaly.No bruits.  Chest: Clear to ascultation bilaterally.No crackles or wheezes. Non tender to palpation  Cardiovascular system; Heart sounds normal,  S1 and  S2 ,no S3.  No murmur, or thrill. Apical beat not displaced Peripheral pulses normal.  Abdomen: Soft, non tender, no organomegaly or masses. No bruits. Bowel sounds normal. No guarding, tenderness or rebound.      Musculoskeletal exam: Full ROM of spine, hips , shoulders and knees. No deformity ,swelling or crepitus noted. No muscle wasting or atrophy.   Neurologic: Cranial nerves 2 to 12 intact. Power, tone ,sensation and reflexes normal throughout. No disturbance in gait. No tremor.  Skin: Intact, no ulceration, erythema , scaling or rash noted. Pigmentation normal throughout  Psych; Normal mood and affect. Judgement and concentration normal   Assessment & Plan:  Annual physical exam Annual exam as documented. Counseling done  re healthy lifestyle involving commitment to 150 minutes exercise per week, heart healthy diet, and attaining healthy weight.The importance of adequate sleep also discussed. Regular seat belt use and home safety, is also discussed. Changes in health habits are decided on by the patient with goals and time frames  set for achieving them. Immunization and cancer screening needs are  specifically addressed at this visit.   Essential hypertension Refer for echo, and start jardiance Increase medication, uncontrolled ,not at goal DASH diet and commitment to daily physical activity for a minimum of 30 minutes discussed and encouraged, as a part of hypertension management. The importance of attaining a healthy weight is also discussed.  BP/Weight 02/07/2021 10/10/2020 08/16/2020 08/07/2020 07/12/2020 7/0/9628 07/02/6292  Systolic BP 765 98 465 99 035 - 465  Diastolic BP 78 67 75 65 80 - 87  Wt. (Lbs) 238 234.1 235 230 238 241.6 238.12  BMI 38.41 36.67 36.81 36.02 37.28 37.84 35.16       Type 2 diabetes mellitus with vascular disease ([place]) Helen Mahoney is reminded of the importance of commitment to daily physical activity for 30 minutes or more, as able and the need to limit carbohydrate intake to 30 to 60 grams per meal to help with blood sugar control.   The need to take medication as prescribed, test blood sugar as directed, and to call between visits if there is a concern that blood sugar is uncontrolled is also discussed.  Jardiance 10 mg daily added  Helen Mahoney is reminded of the importance of daily foot exam, annual eye examination, and good blood sugar, blood pressure and cholesterol control.  Diabetic Labs Latest Ref Rng & Units 02/01/2021 10/03/2020 08/14/2020 08/04/2020 07/07/2020  HbA1c 4.8 - 5.6 % 7.3(H) - 7.4(H) - -  Microalbumin mg/dL - - - - -  Micro/Creat Ratio 0 - 29 mg/g creat - - 19 - -  Chol 100 -  199 mg/dL 158 - 173 - -  HDL >39 mg/dL 52 - 47 - -  Calc LDL 0 - 99 mg/dL 92 - 108(H) - -  Triglycerides 0 - 149 mg/dL 70 - 97 - -  Creatinine 0.57 - 1.00 mg/dL 1.19(H) 1.05(H) 1.00 1.17(H) 1.07(H)   BP/Weight 02/07/2021 10/10/2020 08/16/2020 08/07/2020 07/12/2020 06/06/32 12/28/7913  Systolic BP 056 98 979 99 480 - 165  Diastolic BP 78 67 75 65 80 - 87  Wt. (Lbs) 238 234.1 235 230 238 241.6 238.12  BMI 38.41 36.67 36.81 36.02 37.28 37.84 35.16   Foot/eye exam  completion dates Latest Ref Rng & Units 02/07/2021 12/16/2019  Eye Exam No Retinopathy - -  Foot exam Order - - -  Foot Form Completion - Done Done        Morbid obesity  Patient re-educated about  the importance of commitment to a  minimum of 150 minutes of exercise per week as able.  The importance of healthy food choices with portion control discussed, as well as eating regularly and within a 12 hour window most days. The need to choose "clean , green" food 50 to 75% of the time is discussed, as well as to make water the primary drink and set a goal of 64 ounces water daily.    Weight /BMI 02/07/2021 10/10/2020 08/16/2020  WEIGHT 238 lb 234 lb 1.6 oz 235 lb  HEIGHT 5\' 6"  - 5\' 7"   BMI 38.41 kg/m2 36.67 kg/m2 36.81 kg/m2

## 2021-02-07 NOTE — Assessment & Plan Note (Signed)
  Patient re-educated about  the importance of commitment to a  minimum of 150 minutes of exercise per week as able.  The importance of healthy food choices with portion control discussed, as well as eating regularly and within a 12 hour window most days. The need to choose "clean , green" food 50 to 75% of the time is discussed, as well as to make water the primary drink and set a goal of 64 ounces water daily.    Weight /BMI 02/07/2021 10/10/2020 08/16/2020  WEIGHT 238 lb 234 lb 1.6 oz 235 lb  HEIGHT 5\' 6"  - 5\' 7"   BMI 38.41 kg/m2 36.67 kg/m2 36.81 kg/m2

## 2021-02-07 NOTE — Assessment & Plan Note (Addendum)
Refer for echo, and start jardiance Increase medication, uncontrolled ,not at goal DASH diet and commitment to daily physical activity for a minimum of 30 minutes discussed and encouraged, as a part of hypertension management. The importance of attaining a healthy weight is also discussed.  BP/Weight 02/07/2021 10/10/2020 08/16/2020 08/07/2020 07/12/2020 01/04/2819 6/0/1561  Systolic BP 537 98 943 99 276 - 147  Diastolic BP 78 67 75 65 80 - 87  Wt. (Lbs) 238 234.1 235 230 238 241.6 238.12  BMI 38.41 36.67 36.81 36.02 37.28 37.84 35.16

## 2021-02-08 ENCOUNTER — Other Ambulatory Visit: Payer: Self-pay

## 2021-02-08 MED ORDER — METFORMIN HCL 1000 MG PO TABS: 1000.0000 mg | ORAL_TABLET | Freq: Two times a day (BID) | ORAL | 0 refills | Status: DC

## 2021-02-20 ENCOUNTER — Telehealth: Payer: Self-pay

## 2021-02-20 ENCOUNTER — Other Ambulatory Visit: Payer: Self-pay | Admitting: Family Medicine

## 2021-02-20 DIAGNOSIS — H5210 Myopia, unspecified eye: Secondary | ICD-10-CM | POA: Diagnosis not present

## 2021-02-20 LAB — HM DIABETES EYE EXAM

## 2021-02-20 NOTE — Telephone Encounter (Signed)
Patient called said she can not afford this medicine Jardiance 10 mg it will cost her $400.00 is there something that can be called in.

## 2021-02-20 NOTE — Telephone Encounter (Signed)
Spoke with pt she was taking 50units and will now start 55units of lantus

## 2021-02-20 NOTE — Telephone Encounter (Signed)
According to her insurance it is covered (can't do a PA) its just a tier 3. All drugs in that class are a 3. Only glipizide, glipizide/metformin and pioglitazone are lower in tier

## 2021-02-21 DIAGNOSIS — H52223 Regular astigmatism, bilateral: Secondary | ICD-10-CM | POA: Diagnosis not present

## 2021-02-21 DIAGNOSIS — H5213 Myopia, bilateral: Secondary | ICD-10-CM | POA: Diagnosis not present

## 2021-02-23 ENCOUNTER — Ambulatory Visit: Payer: Medicare HMO | Admitting: Cardiology

## 2021-02-23 ENCOUNTER — Encounter: Payer: Self-pay | Admitting: Cardiology

## 2021-02-23 ENCOUNTER — Other Ambulatory Visit: Payer: Self-pay

## 2021-02-23 VITALS — BP 120/68 | HR 96 | Ht 66.0 in | Wt 237.0 lb

## 2021-02-23 DIAGNOSIS — E785 Hyperlipidemia, unspecified: Secondary | ICD-10-CM

## 2021-02-23 DIAGNOSIS — I10 Essential (primary) hypertension: Secondary | ICD-10-CM

## 2021-02-23 DIAGNOSIS — Z7189 Other specified counseling: Secondary | ICD-10-CM | POA: Diagnosis not present

## 2021-02-23 NOTE — Addendum Note (Signed)
Addended by: Fransico Him R on: 02/23/2021 10:01 AM   Modules accepted: Orders

## 2021-02-23 NOTE — Progress Notes (Addendum)
Cardiology CONSULT Note    Date:  02/23/2021   ID:  RHYLIN VENTERS, DOB [birth year], MRN [id number]  PCP:  Fayrene Helper, MD  Cardiologist:  Fransico Him, MD   Chief Complaint  Patient presents with   New Patient (Initial Visit)    Evaluation for cardiac risk assessment, HTN and HLD     History of Present Illness:  Helen Mahoney is a 70 y.o. female who is being seen today for the evaluation of cardiac risk factors at the request of Fayrene Helper, MD.  This is a 70yo AAF with a hx of DM2 on Insulin, HTN, HLD and fm hx of CAD with her father having an MI in his 70's.  She is here today and doing well.  She denies any chest pain or pressure, SOB, DOE (except when walking a long distance in the parking lot), PND, orthopnea, LE edema, dizziness, palpitations or syncope. She has been told in the past that she snores but no awakening snoring or gasping for breath.  She has no significant daytime sleepiness. She is compliant with her meds and is tolerating meds with no SE.      Past Medical History:  Diagnosis Date   Alpha-0- thalassemia trait/carrier 05/18/2015   2013: TCS/EGD 2017: TCS/EGD HYPERPLASTIC GASTRIC POLYPS, LYMPHOCYTIC GASTRITIS    B12 deficiency 06/16/2015   Colon polyps    Diabetes mellitus, type 2 ([place])    Hyperlipidemia    Hypertension    Microcytic anemia 05/18/2015   2013: TCS/EGD 2017: TCS/EGD HYPERPLASTIC GASTRIC POLYPS, LYMPHOCYTIC GASTRITIS    Obesity     Past Surgical History:  Procedure Laterality Date   bilateral tubal ligation  1979   CHOLECYSTECTOMY  2001   ACUTE CHOLECYSTITIS/GALLSTONES   COLONOSCOPY  05/14/2011   Dr. Oneida Alar: sessile polyp in sigmoid colon, internal hemorrhoids, hyerplastic polyps   COLONOSCOPY N/A 05/15/2015   Procedure: COLONOSCOPY;  Surgeon: Danie Binder, MD;  [facility];  Service: Endoscopy;  Laterality: N/A;  0830   COLONOSCOPY W/ POLYPECTOMY  NOV 2008 [facility] ANEMIA   POLYP NO RETRIEVED    COLONOSCOPY W/ POLYPECTOMY  2006 DR. SMTH   POLYP?   COLONOSCOPY WITH PROPOFOL N/A 08/07/2020   Procedure: COLONOSCOPY WITH PROPOFOL;  Surgeon: Eloise Harman, DO;  [facility]: [facility];  Service: Endoscopy;  Laterality: N/A;  ASA II / AM procedure   ESOPHAGOGASTRODUODENOSCOPY  05/14/2011   Dr. Oneida Alar: sessile polyps in the cardia, mild gastritis. Chronic duodenitis consistent with peptic duodenitis, chronic active H.pylori gastritis.    ESOPHAGOGASTRODUODENOSCOPY N/A 05/15/2015   Procedure: ESOPHAGOGASTRODUODENOSCOPY (EGD);  Surgeon: Danie Binder, MD;  [facility]: [facility];  Service: Endoscopy;  Laterality: N/A;   GIVENS CAPSULE STUDY N/A 06/02/2015   Procedure: GIVENS CAPSULE STUDY;  Surgeon: Danie Binder, MD;  [facility];  Service: Endoscopy;  Laterality: N/A;  0700   POLYPECTOMY  08/07/2020   Procedure: POLYPECTOMY;  Surgeon: Eloise Harman, DO;  [facility];  Service: Endoscopy;;   TUBAL LIGATION     UPPER GASTROINTESTINAL ENDOSCOPY  NOV 2008 [facility] ANEMIA   NL EXAM, urease neg    Current Medications: Current Meds  Medication Sig   Alcohol Swabs (DROPSAFE ALCOHOL PREP) 70 % PADS USE AS DIRECTED THREE TIMES DAILY   aspirin 81 MG tablet Take 81 mg by mouth daily.   Blood Glucose Calibration (TRUE METRIX LEVEL 1) Low SOLN USE AS DIRECTED AS NEEDED  Calcium Citrate-Vitamin D 200-250 MG-UNIT TABS Take 1 tablet by mouth daily.   Cholecalciferol (VITAMIN D3) 2000 units TABS Take 2,000 Units by mouth daily.   DROPLET PEN NEEDLES 31G X 8 MM MISC USE 1 PEN ONCE DAILY AS DIRECTED   glipiZIDE (GLUCOTROL) 10 MG tablet TAKE 1 TABLET TWICE DAILY BEFORE MEALS   LANTUS SOLOSTAR 100 UNIT/ML Solostar Pen INJECT 50 UNITS SUBCUTANEOUSLY ONCE DAILY AT 10 PM   lisinopril-hydrochlorothiazide (ZESTORETIC) 20-12.5 MG tablet Take two tablets by mouth once daily for blood pressure   metFORMIN (GLUCOPHAGE) 1000 MG tablet Take 1 tablet (1,000 mg total) by mouth 2 (two) times daily  with a meal.   Multiple Vitamin (MULTIVITAMINS PO) Take 1 tablet by mouth daily.   pravastatin (PRAVACHOL) 80 MG tablet TAKE 1 TABLET ONE TIME DAILY IN THE EVENING   TRUE METRIX BLOOD GLUCOSE TEST test strip TEST BLOOD SUGAR THREE TIMES DAILY   TRUEplus Lancets 33G MISC USE TO TEST BLOOD SUGAR THREE TIMES DAILY    Allergies:   Tetanus toxoids and Penicillins   Social History   Socioeconomic History   Marital status: Married    Spouse name: Not on file   Number of children: Not on file   Years of education: Not on file   Highest education level: Not on file  Occupational History   Not on file  Tobacco Use   Smoking status: Never   Smokeless tobacco: Never  Vaping Use   Vaping Use: Never used  Substance and Sexual Activity   Alcohol use: No   Drug use: No   Sexual activity: Not Currently  Other Topics Concern   Not on file  Social History Narrative   Not on file   Social Determinants of Health   Financial Resource Strain: Low Risk    Difficulty of Paying Living Expenses: Not hard at all  Food Insecurity: No Food Insecurity   Worried About Charity fundraiser in the Last Year: Never true   [place] in the Last Year: Never true  Transportation Needs: No Transportation Needs   Lack of Transportation (Medical): No   Lack of Transportation (Non-Medical): No  Physical Activity: Not on file  Stress: No Stress Concern Present   Feeling of Stress : Not at all  Social Connections: Moderately Isolated   Frequency of Communication with Friends and Family: More than three times a week   Frequency of Social Gatherings with Friends and Family: More than three times a week   Attends Religious Services: More than 4 times per year   Active Member of [employer] or Organizations: No   Attends Archivist Meetings: Never   Marital Status: Widowed     Family History:  The patient's family history includes Breast cancer in her mother; Colon cancer (age of onset: 39) in her  sister; Diabetes in her brother, father, mother, sister, sister, and sister; Heart attack in her father; Heart disease in her mother; Hepatitis C in her sister; Hypertension in her mother, sister, sister, and sister; Kidney disease in her father; Mental illness in her sister, sister, and sister; Stroke in her mother.   ROS:   Please see the history of present illness.    ROS All other systems reviewed and are negative.  No flowsheet data found.     PHYSICAL EXAM:   VS:  BP 120/68   Pulse 96   Ht 5\' 6"  (1.676 m)   Wt 237 lb (107.5 kg)   BMI  38.25 kg/m    GEN: Well nourished, well developed, in no acute distress  HEENT: normal  Neck: no JVD, carotid bruits, or masses Cardiac: RRR; no murmurs, rubs, or gallops,no edema.  Intact distal pulses bilaterally.  Respiratory:  clear to auscultation bilaterally, normal work of breathing GI: soft, nontender, nondistended, + BS MS: no deformity or atrophy  Skin: warm and dry, no rash Neuro:  Alert and Oriented x 3, Strength and sensation are intact Psych: euthymic mood, full affect  Wt Readings from Last 3 Encounters:  02/23/21 237 lb (107.5 kg)  02/07/21 238 lb (108 kg)  10/10/20 234 lb 1.6 oz (106.2 kg)      Studies/Labs Reviewed:   EKG:  EKG is  ordered today.  The ekg ordered today demonstrates NSR with no ST changes  Recent Labs: 10/03/2020: Hemoglobin 10.3; Magnesium 1.6; Platelets 346 02/01/2021: ALT 14; BUN 17; Creatinine, Ser 1.19; Potassium 4.2; Sodium 139   Lipid Panel    Component Value Date/Time   CHOL 158 02/01/2021 0803   TRIG 70 02/01/2021 0803   HDL 52 02/01/2021 0803   CHOLHDL 3.0 02/01/2021 0803   CHOLHDL 3.3 03/30/2019 0914   VLDL 29 11/04/2016 1033   LDLCALC 92 02/01/2021 0803   LDLCALC 106 (H) 03/30/2019 0914    Additional studies/ records that were reviewed today include:  OV notes from PCP    ASSESSMENT:    1. Encounter for cardiac risk counseling   2. Essential hypertension   3. Hyperlipidemia  LDL goal <100      PLAN:  In order of problems listed above:  Evaluation for underlying CAD due to CRFs -she is completely asymptomatic from a cardiac standpoint -her CRFs include HTN, HLD, DM2 and fm hx of CAD although at later age in life -recommend ETT to rule out ischemia since EKG is normal and coronary Ca score -Shared Decision Making/Informed Consent The risks [chest pain, shortness of breath, cardiac arrhythmias, dizziness, blood pressure fluctuations, myocardial infarction, stroke/transient ischemic attack, and life-threatening complications (estimated to be 1 in 10,000)], benefits (risk stratification, diagnosing coronary artery disease, treatment guidance) and alternatives of an exercise tolerance test were discussed in detail with Helen Mahoney and she agrees to proceed.   2.  HTN -BP controlled on exam today -check 2D echo -continue prescription drug management with Lisinopril HCT 20-12.5mg  daily with PRN refills  3.  HLD -LDL goal < 100 -I have personally reviewed and interpreted outside labs performed by patient's PCP which showed LDL 92, HDL 52, TAGS 70 and ALT 14 on 02/01/2021 -continue prescription drug management with Pravachol 80mg  daily with PRN refills    Time Spent: 25 minutes total time of encounter, including 20 minutes spent in face-to-face patient care on the date of this encounter. This time includes coordination of care and counseling regarding above mentioned problem list. Remainder of non-face-to-face time involved reviewing chart documents/testing relevant to the patient encounter and documentation in the medical record. I have independently reviewed documentation from referring provider  Medication Adjustments/Labs and Tests Ordered: Current medicines are reviewed at length with the patient today.  Concerns regarding medicines are outlined above.  Medication changes, Labs and Tests ordered today are listed in the Patient Instructions below.  There are no  Patient Instructions on file for this visit.   Signed, Fransico Him, MD  02/23/2021 9:51 AM    [address], [place], [place]  [address] Phone: [phone]; Fax: [phone]

## 2021-02-23 NOTE — Patient Instructions (Signed)
Medication Instructions:  Your physician recommends that you continue on your current medications as directed. Please refer to the Current Medication list given to you today.  *If you need a refill on your cardiac medications before your next appointment, please call your pharmacy*   Testing/Procedures: Your physician has requested that you have an echocardiogram. Echocardiography is a painless test that uses sound waves to create images of your heart. It provides your doctor with information about the size and shape of your heart and how well your heart's chambers and valves are working. This procedure takes approximately one hour. There are no restrictions for this procedure.  Your physician has requested that you have an exercise tolerance test. For further information please visit [email or web address]. Please also follow instruction sheet, as given.  Your physician has requested that you have a calcium score CT scan.   Follow-Up: At [facility], you and your health needs are our priority.  As part of our continuing mission to provide you with exceptional heart care, we have created designated Provider Care Teams.  These Care Teams include your primary Cardiologist (physician) and Advanced Practice Providers (APPs -  Physician Assistants and Nurse Practitioners) who all work together to provide you with the care you need, when you need it.  Follow up with Dr .Turner as needed based on results of testing.    

## 2021-02-23 NOTE — Addendum Note (Signed)
Addended by: Antonieta Iba on: 02/23/2021 10:00 AM   Modules accepted: Orders

## 2021-02-26 ENCOUNTER — Other Ambulatory Visit: Payer: Self-pay

## 2021-02-26 ENCOUNTER — Ambulatory Visit (HOSPITAL_COMMUNITY)
Admission: RE | Admit: 2021-02-26 | Discharge: 2021-02-26 | Disposition: A | Discharge status: Home / Self Care | Payer: Medicare HMO | Source: Ambulatory Visit | Attending: Family Medicine | Admitting: Family Medicine

## 2021-02-26 DIAGNOSIS — Z1231 Encounter for screening mammogram for malignant neoplasm of breast: Secondary | ICD-10-CM | POA: Insufficient documentation

## 2021-02-28 ENCOUNTER — Other Ambulatory Visit: Payer: Self-pay | Admitting: Family Medicine

## 2021-03-01 ENCOUNTER — Other Ambulatory Visit: Payer: Self-pay

## 2021-03-01 ENCOUNTER — Ambulatory Visit (HOSPITAL_COMMUNITY)
Admission: RE | Admit: 2021-03-01 | Discharge: 2021-03-01 | Disposition: A | Discharge status: Home / Self Care | Payer: Medicare HMO | Source: Ambulatory Visit | Attending: Cardiology | Admitting: Cardiology

## 2021-03-01 DIAGNOSIS — I10 Essential (primary) hypertension: Secondary | ICD-10-CM | POA: Diagnosis not present

## 2021-03-01 DIAGNOSIS — E785 Hyperlipidemia, unspecified: Secondary | ICD-10-CM | POA: Insufficient documentation

## 2021-03-01 DIAGNOSIS — Z7189 Other specified counseling: Secondary | ICD-10-CM | POA: Diagnosis not present

## 2021-03-01 LAB — EXERCISE TOLERANCE TEST
Angina Index: 0
Duke Treadmill Score: 7
Estimated workload: 4.6
Exercise duration (min): 7 min
Exercise duration (sec): 23 s
MPHR: 150 {beats}/min
Peak HR: 150 {beats}/min
Percent HR: 100 %
RPE: 13
Rest HR: 92 {beats}/min
ST Depression (mm): 0 mm

## 2021-03-10 IMAGING — MG DIGITAL SCREENING BILAT W/ TOMO W/ CAD
8 series · 8 of 24 positions shown · non-contrast
Comparison: Previous exam(s).

ACR Breast Density Category a: The breast tissue is almost entirely
fatty.

CLINICAL DATA: Screening.

EXAM:
DIGITAL SCREENING BILATERAL MAMMOGRAM WITH TOMO AND CAD

[L CC synth-2D]
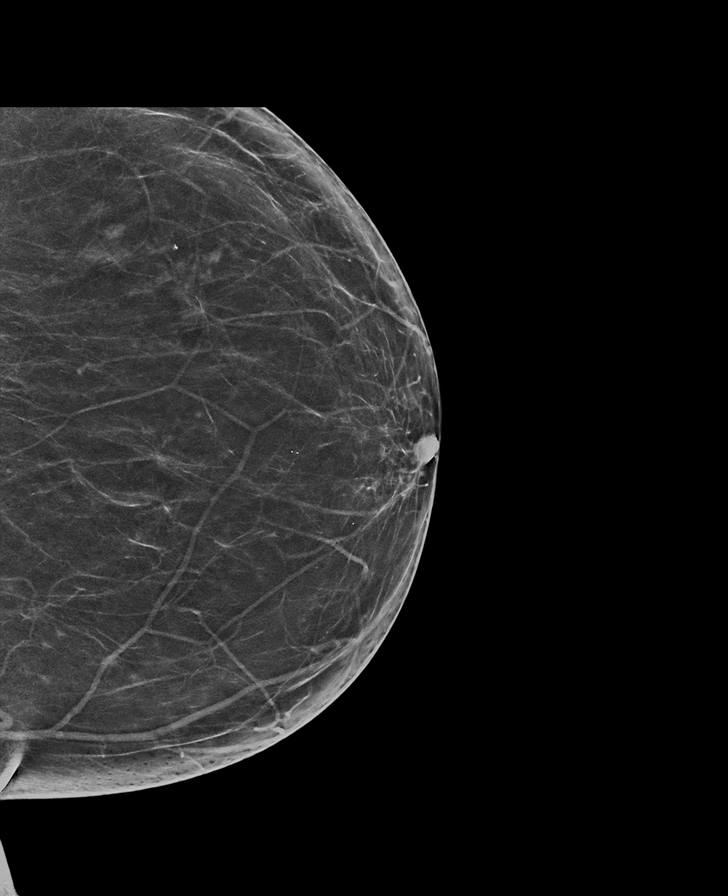

[R MLO synth-2D]
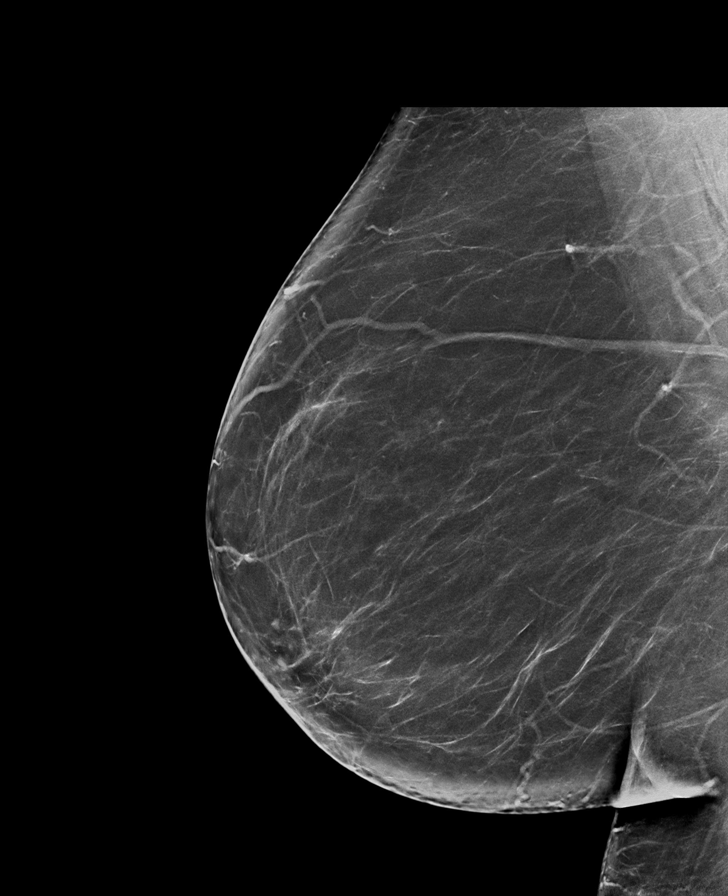

[L MLO synth-2D]
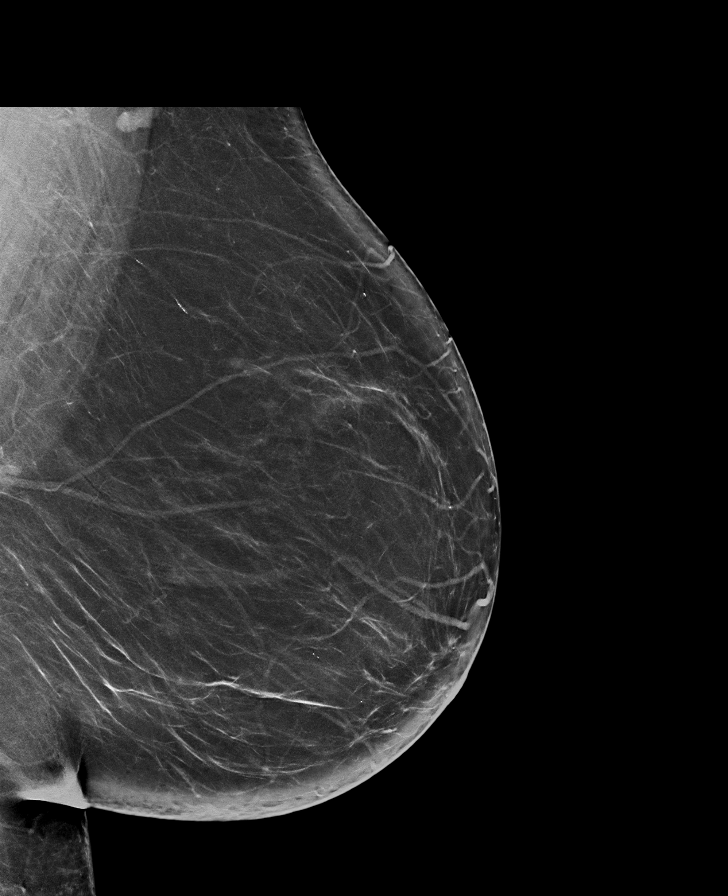

[R CC synth-2D]
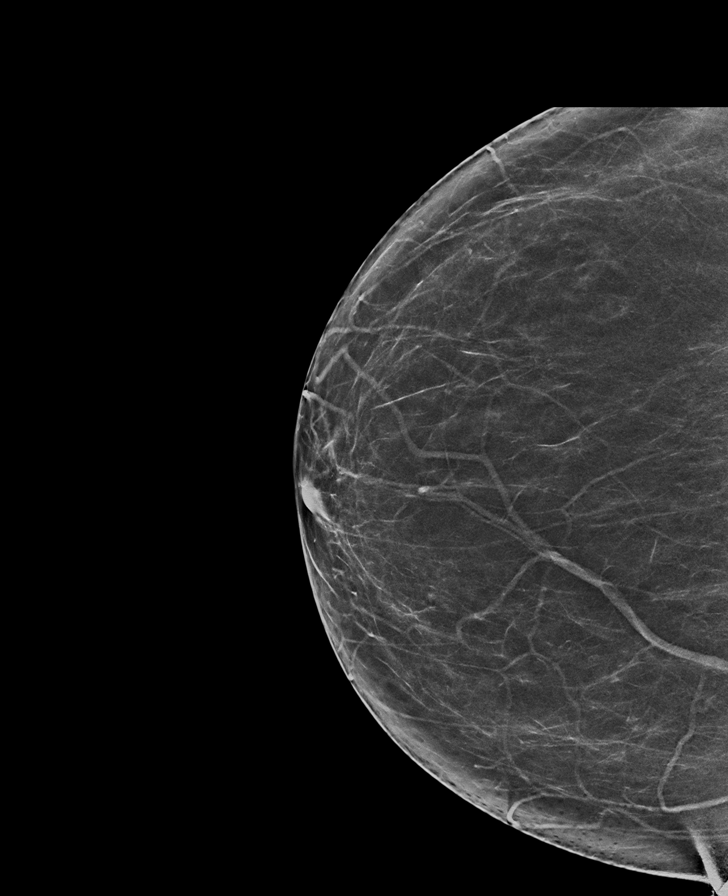

[R CC tomo · tomo slice 37/72.0]
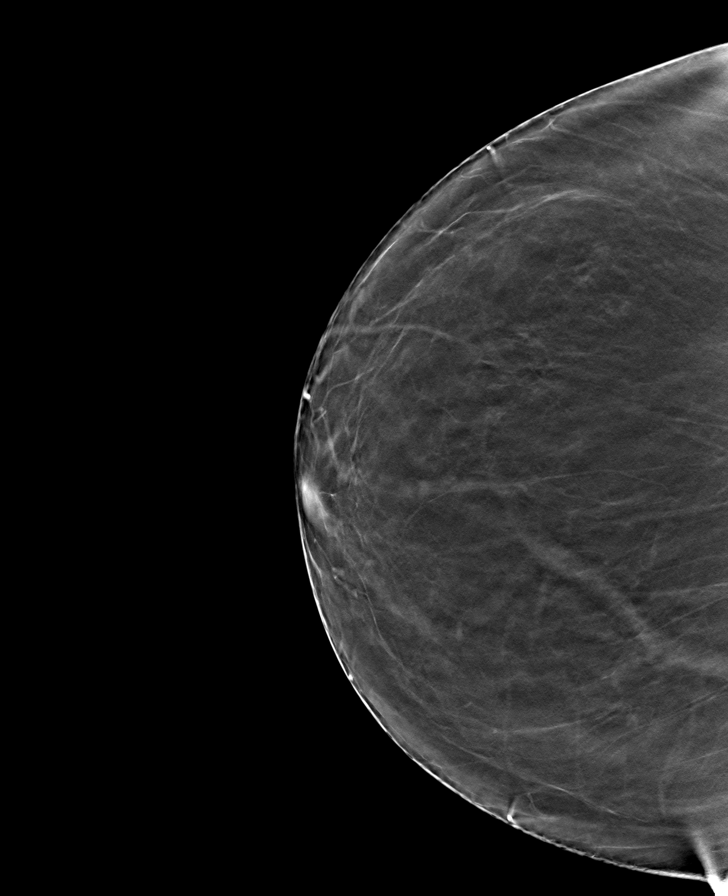

[R MLO tomo · tomo slice 44/87.0]
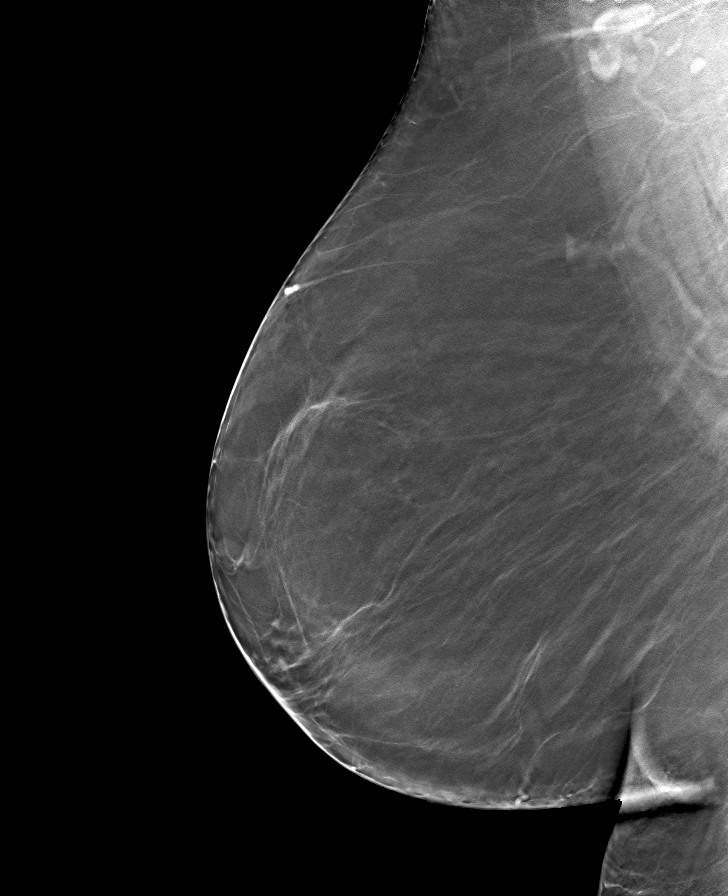

[L MLO tomo · tomo slice 44/87.0]
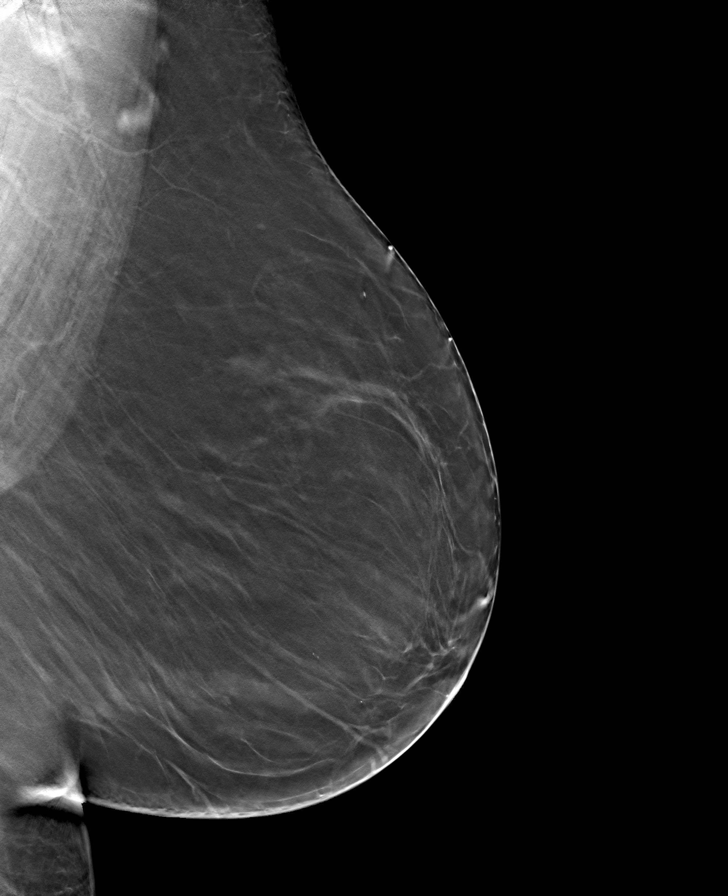

[L CC tomo · tomo slice 37/73.0]
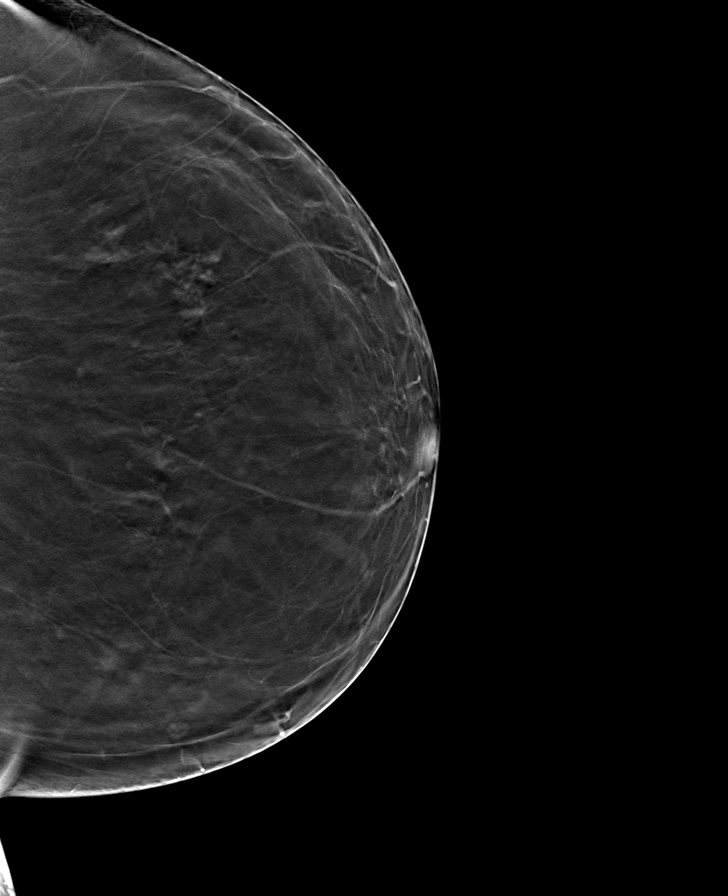

[8 of 24 positions shown; findings below may reference images not displayed]

FINDINGS: There are no findings suspicious for malignancy. Images were
processed with CAD.
IMPRESSION: No mammographic evidence of malignancy. A result letter of this
screening mammogram will be mailed directly to the patient.

RECOMMENDATION:
Screening mammogram in one year. (Code:[id number]-[id number])

BI-RADS CATEGORY  1: Negative.

## 2021-03-13 ENCOUNTER — Other Ambulatory Visit: Payer: Self-pay | Admitting: Family Medicine

## 2021-03-30 ENCOUNTER — Ambulatory Visit (HOSPITAL_COMMUNITY)
Admission: RE | Admit: 2021-03-30 | Discharge: 2021-03-30 | Disposition: A | Discharge status: Home / Self Care | Payer: Self-pay | Source: Ambulatory Visit | Attending: Cardiology | Admitting: Cardiology

## 2021-03-30 ENCOUNTER — Other Ambulatory Visit: Payer: Self-pay

## 2021-03-30 ENCOUNTER — Telehealth: Payer: Self-pay | Admitting: Family Medicine

## 2021-03-30 DIAGNOSIS — Z7189 Other specified counseling: Secondary | ICD-10-CM | POA: Insufficient documentation

## 2021-03-30 NOTE — Telephone Encounter (Signed)
Pt called in in regard to recent CT scan   Pt states that results show a nodule on patients left side   Pt wants to discuss for next steps

## 2021-04-01 ENCOUNTER — Encounter: Payer: Self-pay | Admitting: Cardiology

## 2021-04-03 ENCOUNTER — Telehealth: Payer: Self-pay

## 2021-04-03 DIAGNOSIS — E785 Hyperlipidemia, unspecified: Secondary | ICD-10-CM

## 2021-04-03 NOTE — Telephone Encounter (Addendum)
Helen Margarita, MD  04/01/2021  9:58 AM EST Back to Top    Elevated Coronary Ca score at 51 - refer to lipid clinic as LDL is not at goal      Left message to return call.    Referral placed to Lipid Clinic.

## 2021-04-04 NOTE — Telephone Encounter (Signed)
Spoke with pt and let her know Dr Moshe Cipro would be back on Dec 12th and I would send this to Dr. Posey Pronto to look at to make sure there is nothing we need to do before Dr Moshe Cipro comes back

## 2021-04-04 NOTE — Telephone Encounter (Signed)
Spoke with pt she will discuss this further at appt in January

## 2021-04-05 ENCOUNTER — Other Ambulatory Visit: Payer: Self-pay

## 2021-04-05 ENCOUNTER — Ambulatory Visit (HOSPITAL_COMMUNITY)
Admission: RE | Admit: 2021-04-05 | Discharge: 2021-04-05 | Disposition: A | Discharge status: Home / Self Care | Payer: Medicare HMO | Source: Ambulatory Visit | Attending: Cardiology | Admitting: Cardiology

## 2021-04-05 DIAGNOSIS — I10 Essential (primary) hypertension: Secondary | ICD-10-CM | POA: Diagnosis not present

## 2021-04-05 DIAGNOSIS — E785 Hyperlipidemia, unspecified: Secondary | ICD-10-CM | POA: Insufficient documentation

## 2021-04-05 DIAGNOSIS — Z7189 Other specified counseling: Secondary | ICD-10-CM | POA: Insufficient documentation

## 2021-04-05 LAB — ECHOCARDIOGRAM COMPLETE
Area-P 1/2: 3.42 cm2
S' Lateral: 2.5 cm

## 2021-04-05 NOTE — Progress Notes (Signed)
*  PRELIMINARY RESULTS* Echocardiogram 2D Echocardiogram has been performed.  Samuel Germany 04/05/2021, 10:31 AM

## 2021-04-06 NOTE — Telephone Encounter (Signed)
Patient notified and verbalized understanding. Pt had no questions or concerns at this time. Referral to Lipid clinic entered.

## 2021-05-01 ENCOUNTER — Ambulatory Visit (INDEPENDENT_AMBULATORY_CARE_PROVIDER_SITE_OTHER): Payer: Medicare HMO | Admitting: Family Medicine

## 2021-05-01 ENCOUNTER — Other Ambulatory Visit: Payer: Self-pay

## 2021-05-01 ENCOUNTER — Encounter: Payer: Self-pay | Admitting: Family Medicine

## 2021-05-01 VITALS — BP 131/74 | HR 96 | Ht 66.0 in | Wt 232.1 lb

## 2021-05-01 DIAGNOSIS — I10 Essential (primary) hypertension: Secondary | ICD-10-CM

## 2021-05-01 DIAGNOSIS — E1159 Type 2 diabetes mellitus with other circulatory complications: Secondary | ICD-10-CM

## 2021-05-01 DIAGNOSIS — E785 Hyperlipidemia, unspecified: Secondary | ICD-10-CM | POA: Diagnosis not present

## 2021-05-01 MED ORDER — EZETIMIBE 10 MG PO TABS: 10.0000 mg | ORAL_TABLET | Freq: Every day | ORAL | 3 refills | Status: DC

## 2021-05-01 MED ORDER — ROSUVASTATIN CALCIUM 40 MG PO TABS: 40.0000 mg | ORAL_TABLET | Freq: Every day | ORAL | 3 refills | Status: DC

## 2021-05-01 NOTE — Assessment & Plan Note (Signed)
Controlled, no change in medication  DASH diet and commitment to daily physical activity for a minimum of 30 minutes discussed and encouraged, as a part of hypertension management. The importance of attaining a healthy weight is also discussed.  BP/Weight 05/01/2021 02/23/2021 02/07/2021 10/10/2020 08/16/2020 08/07/2020 11/17/8286  Systolic BP 337 445 146 98 047 99 998  Diastolic BP 74 68 78 67 75 65 80  Wt. (Lbs) 232.08 237 238 234.1 235 230 238  BMI 37.46 38.25 38.41 36.67 36.81 36.02 37.28

## 2021-05-01 NOTE — Assessment & Plan Note (Signed)
Ms. Lattner is reminded of the importance of commitment to daily physical activity for 30 minutes or more, as able and the need to limit carbohydrate intake to 30 to 60 grams per meal to help with blood sugar control.   The need to take medication as prescribed, test blood sugar as directed, and to call between visits if there is a concern that blood sugar is uncontrolled is also discussed.   Ms. Petras is reminded of the importance of daily foot exam, annual eye examination, and good blood sugar, blood pressure and cholesterol control.  Diabetic Labs Latest Ref Rng & Units 02/01/2021 10/03/2020 08/14/2020 08/04/2020 07/07/2020  HbA1c 4.8 - 5.6 % 7.3(H) - 7.4(H) - -  Microalbumin mg/dL - - - - -  Micro/Creat Ratio 0 - 29 mg/g creat - - 19 - -  Chol 100 - 199 mg/dL 158 - 173 - -  HDL >39 mg/dL 52 - 47 - -  Calc LDL 0 - 99 mg/dL 92 - 108(H) - -  Triglycerides 0 - 149 mg/dL 70 - 97 - -  Creatinine 0.57 - 1.00 mg/dL 1.19(H) 1.05(H) 1.00 1.17(H) 1.07(H)   BP/Weight 05/01/2021 02/23/2021 02/07/2021 10/10/2020 08/16/2020 08/07/2020 7/35/6701  Systolic BP 410 301 314 98 388 99 875  Diastolic BP 74 68 78 67 75 65 80  Wt. (Lbs) 232.08 237 238 234.1 235 230 238  BMI 37.46 38.25 38.41 36.67 36.81 36.02 37.28   Foot/eye exam completion dates Latest Ref Rng & Units 02/07/2021 12/16/2019  Eye Exam No Retinopathy - -  Foot exam Order - - -  Foot Form Completion - Done Done      Updated lab needed at/ before next visit. Refer to pharmacist for [employer]

## 2021-05-01 NOTE — Assessment & Plan Note (Signed)
Hyperlipidemia:Low fat diet discussed and encouraged.   Lipid Panel  Lab Results  Component Value Date   CHOL 158 02/01/2021   HDL 52 02/01/2021   LDLCALC 92 02/01/2021   TRIG 70 02/01/2021   CHOLHDL 3.0 02/01/2021  change to crestor and zetia

## 2021-05-01 NOTE — Assessment & Plan Note (Signed)
°  Patient re-educated about  the importance of commitment to a  minimum of 150 minutes of exercise per week as able.  The importance of healthy food choices with portion control discussed, as well as eating regularly and within a 12 hour window most days. The need to choose "clean , green" food 50 to 75% of the time is discussed, as well as to make water the primary drink and set a goal of 64 ounces water daily.    Weight /BMI 05/01/2021 02/23/2021 02/07/2021  WEIGHT 232 lb 1.3 oz 237 lb 238 lb  HEIGHT 5\' 6"  5\' 6"  5\' 6"   BMI 37.46 kg/m2 38.25 kg/m2 38.41 kg/m2

## 2021-05-01 NOTE — Progress Notes (Signed)
Helen Mahoney     MRN: [id number]      DOB: [birth year]   HPI Helen Mahoney is here for follow up and re-evaluation of chronic medical conditions, medication management and review of any available recent lab and radiology data.  Preventive health is updated, specifically  Cancer screening and Immunization.   Questions or concerns regarding consultations or procedures which the PT has had in the interim are  addressed. The PT denies any adverse reactions to current medications since the last visit.  There are no new concerns.  There are no specific complaints   ROS Denies recent fever or chills. Denies sinus pressure, nasal congestion, ear pain or sore throat. Denies chest congestion, productive cough or wheezing. Denies chest pains, palpitations and leg swelling Denies abdominal pain, nausea, vomiting,diarrhea or constipation.   Denies dysuria, frequency, hesitancy or incontinence. Denies joint pain, swelling and limitation in mobility. Denies headaches, seizures, numbness, or tingling. Denies depression, anxiety or insomnia. Denies skin break down or rash.   PE  BP 131/74    Pulse 96    Ht 5\' 6"  (1.676 m)    Wt 232 lb 1.3 oz (105.3 kg)    SpO2 94%    BMI 37.46 kg/m   Patient alert and oriented and in no cardiopulmonary distress.  HEENT: No facial asymmetry, EOMI,     Neck supple .  Chest: Clear to auscultation bilaterally.  CVS: S1, S2 no murmurs, no S3.Regular rate.  ABD: Soft non tender.   Ext: No edema  MS: Adequate ROM spine, shoulders, hips and knees.  Skin: Intact, no ulcerations or rash noted.  Psych: Good eye contact, normal affect. Memory intact not anxious or depressed appearing.  CNS: CN 2-12 intact, power,  normal throughout.no focal deficits noted.   Assessment & Plan  Essential hypertension Controlled, no change in medication  DASH diet and commitment to daily physical activity for a minimum of 30 minutes discussed and encouraged, as a part  of hypertension management. The importance of attaining a healthy weight is also discussed.  BP/Weight 05/01/2021 02/23/2021 02/07/2021 10/10/2020 08/16/2020 08/07/2020 10/18/2977  Systolic BP 892 119 417 98 408 99 144  Diastolic BP 74 68 78 67 75 65 80  Wt. (Lbs) 232.08 237 238 234.1 235 230 238  BMI 37.46 38.25 38.41 36.67 36.81 36.02 37.28           Morbid obesity  Patient re-educated about  the importance of commitment to a  minimum of 150 minutes of exercise per week as able.  The importance of healthy food choices with portion control discussed, as well as eating regularly and within a 12 hour window most days. The need to choose "clean , green" food 50 to 75% of the time is discussed, as well as to make water the primary drink and set a goal of 64 ounces water daily.    Weight /BMI 05/01/2021 02/23/2021 02/07/2021  WEIGHT 232 lb 1.3 oz 237 lb 238 lb  HEIGHT 5\' 6"  5\' 6"  5\' 6"   BMI 37.46 kg/m2 38.25 kg/m2 38.41 kg/m2      Type 2 diabetes mellitus with vascular disease ([place]) Helen Mahoney is reminded of the importance of commitment to daily physical activity for 30 minutes or more, as able and the need to limit carbohydrate intake to 30 to 60 grams per meal to help with blood sugar control.   The need to take medication as prescribed, test blood sugar as directed, and to call between visits if  there is a concern that blood sugar is uncontrolled is also discussed.   Helen Mahoney is reminded of the importance of daily foot exam, annual eye examination, and good blood sugar, blood pressure and cholesterol control.  Diabetic Labs Latest Ref Rng & Units 02/01/2021 10/03/2020 08/14/2020 08/04/2020 07/07/2020  HbA1c 4.8 - 5.6 % 7.3(H) - 7.4(H) - -  Microalbumin mg/dL - - - - -  Micro/Creat Ratio 0 - 29 mg/g creat - - 19 - -  Chol 100 - 199 mg/dL 158 - 173 - -  HDL >39 mg/dL 52 - 47 - -  Calc LDL 0 - 99 mg/dL 92 - 108(H) - -  Triglycerides 0 - 149 mg/dL 70 - 97 - -  Creatinine 0.57 - 1.00  mg/dL 1.19(H) 1.05(H) 1.00 1.17(H) 1.07(H)   BP/Weight 05/01/2021 02/23/2021 02/07/2021 10/10/2020 08/16/2020 08/07/2020 5/63/1497  Systolic BP 026 378 588 98 502 99 774  Diastolic BP 74 68 78 67 75 65 80  Wt. (Lbs) 232.08 237 238 234.1 235 230 238  BMI 37.46 38.25 38.41 36.67 36.81 36.02 37.28   Foot/eye exam completion dates Latest Ref Rng & Units 02/07/2021 12/16/2019  Eye Exam No Retinopathy - -  Foot exam Order - - -  Foot Form Completion - Done Done      Updated lab needed at/ before next visit. Refer to pharmacist for asisstance  Hyperlipidemia LDL goal <70 Hyperlipidemia:Low fat diet discussed and encouraged.   Lipid Panel  Lab Results  Component Value Date   CHOL 158 02/01/2021   HDL 52 02/01/2021   LDLCALC 92 02/01/2021   TRIG 70 02/01/2021   CHOLHDL 3.0 02/01/2021  change to crestor and zetia

## 2021-05-01 NOTE — Patient Instructions (Addendum)
F/u 2nd week in April, call if you need me sooner  Excellent BP, no med change  You are referred to pharmacist for diabetes management  Non fasting hBA1C, cmp and EGFr on Jan 6 ( FRiday)   HBA1C, fasting lipid, cmp and eGFR April 7 or shortly after for next in office visit  Please get shingrix vaccines at your pharmacy, if able   New for cholesterol rosuvastin , crestor 40 mg and zetia , 10 mg, stop pravastatin once you get these  It is important that you exercise regularly at least 30 minutes 5 times a week. If you develop chest pain, have severe difficulty breathing, or feel very tired, stop exercising immediately and seek medical attention  Think about what you will eat, plan ahead. Choose " clean, green, fresh or frozen" over canned, processed or packaged foods which are more sugary, salty and fatty. 70 to 75% of food eaten should be vegetables and fruit. Three meals at set times with snacks allowed between meals, but they must be fruit or vegetables. Aim to eat over a 12 hour period , example 7 am to 7 pm, and STOP after  your last meal of the day. Drink water,generally about 64 ounces per day, no other drink is as healthy. Fruit juice is best enjoyed in a healthy way, by EATING the fruit.  Thanks for choosing [facility], we consider it a privelige to serve you.

## 2021-05-02 ENCOUNTER — Telehealth: Payer: Self-pay | Admitting: *Deleted

## 2021-05-02 NOTE — Chronic Care Management (AMB) (Signed)
Chronic Care Management   Note  05/02/2021 Name: DANNILYNN GALLINA MRN: [id number] DOB: July 27, 1950  KIAYA HALIBURTON is a 71 y.o. year old female who is a primary care patient of Moshe Cipro Norwood Levo, MD. I reached out to [name] by phone today in response to a referral sent by Ms. Makayli Bracken [facility] PCP.  Ms. Hoefling was given information about Chronic Care Management services today including:  CCM service includes personalized support from designated clinical staff supervised by her physician, including individualized plan of care and coordination with other care providers 24/7 contact phone numbers for assistance for urgent and routine care needs. Service will only be billed when office clinical staff spend 20 minutes or more in a month to coordinate care. Only one practitioner may furnish and bill the service in a calendar month. The patient may stop CCM services at any time (effective at the end of the month) by phone call to the office staff. The patient is responsible for co-pay (up to 20% after annual deductible is met) if co-pay is required by the individual health plan.   Patient agreed to services and verbal consent obtained.   Follow up plan: Face to Face appointment with care management team member scheduled for: 05/24/21  [facility]  Direct Dial: [phone]

## 2021-05-03 ENCOUNTER — Inpatient Hospital Stay (HOSPITAL_COMMUNITY): Payer: Medicare HMO | Attending: Hematology

## 2021-05-03 ENCOUNTER — Other Ambulatory Visit: Payer: Self-pay

## 2021-05-03 DIAGNOSIS — Z803 Family history of malignant neoplasm of breast: Secondary | ICD-10-CM | POA: Insufficient documentation

## 2021-05-03 DIAGNOSIS — Z841 Family history of disorders of kidney and ureter: Secondary | ICD-10-CM | POA: Diagnosis not present

## 2021-05-03 DIAGNOSIS — E559 Vitamin D deficiency, unspecified: Secondary | ICD-10-CM | POA: Insufficient documentation

## 2021-05-03 DIAGNOSIS — Z800 Family history of malignant neoplasm of digestive organs: Secondary | ICD-10-CM | POA: Diagnosis not present

## 2021-05-03 DIAGNOSIS — D508 Other iron deficiency anemias: Secondary | ICD-10-CM

## 2021-05-03 DIAGNOSIS — Z9049 Acquired absence of other specified parts of digestive tract: Secondary | ICD-10-CM | POA: Insufficient documentation

## 2021-05-03 DIAGNOSIS — Z823 Family history of stroke: Secondary | ICD-10-CM | POA: Insufficient documentation

## 2021-05-03 DIAGNOSIS — Z79899 Other long term (current) drug therapy: Secondary | ICD-10-CM | POA: Diagnosis not present

## 2021-05-03 DIAGNOSIS — Z887 Allergy status to serum and vaccine status: Secondary | ICD-10-CM | POA: Insufficient documentation

## 2021-05-03 DIAGNOSIS — I10 Essential (primary) hypertension: Secondary | ICD-10-CM | POA: Diagnosis not present

## 2021-05-03 DIAGNOSIS — Z818 Family history of other mental and behavioral disorders: Secondary | ICD-10-CM | POA: Insufficient documentation

## 2021-05-03 DIAGNOSIS — E119 Type 2 diabetes mellitus without complications: Secondary | ICD-10-CM | POA: Diagnosis not present

## 2021-05-03 DIAGNOSIS — I890 Lymphedema, not elsewhere classified: Secondary | ICD-10-CM | POA: Diagnosis not present

## 2021-05-03 DIAGNOSIS — Z833 Family history of diabetes mellitus: Secondary | ICD-10-CM | POA: Diagnosis not present

## 2021-05-03 DIAGNOSIS — Z8379 Family history of other diseases of the digestive system: Secondary | ICD-10-CM | POA: Diagnosis not present

## 2021-05-03 DIAGNOSIS — Z8249 Family history of ischemic heart disease and other diseases of the circulatory system: Secondary | ICD-10-CM | POA: Insufficient documentation

## 2021-05-03 DIAGNOSIS — Z880 Allergy status to penicillin: Secondary | ICD-10-CM | POA: Diagnosis not present

## 2021-05-03 DIAGNOSIS — R5383 Other fatigue: Secondary | ICD-10-CM | POA: Insufficient documentation

## 2021-05-03 DIAGNOSIS — D509 Iron deficiency anemia, unspecified: Secondary | ICD-10-CM | POA: Insufficient documentation

## 2021-05-03 DIAGNOSIS — Z8719 Personal history of other diseases of the digestive system: Secondary | ICD-10-CM | POA: Diagnosis not present

## 2021-05-03 DIAGNOSIS — E785 Hyperlipidemia, unspecified: Secondary | ICD-10-CM | POA: Insufficient documentation

## 2021-05-03 DIAGNOSIS — D563 Thalassemia minor: Secondary | ICD-10-CM | POA: Diagnosis not present

## 2021-05-03 DIAGNOSIS — E538 Deficiency of other specified B group vitamins: Secondary | ICD-10-CM | POA: Diagnosis not present

## 2021-05-03 DIAGNOSIS — E669 Obesity, unspecified: Secondary | ICD-10-CM | POA: Insufficient documentation

## 2021-05-03 LAB — VITAMIN B12: Vitamin B-12: 1387 pg/mL — ABNORMAL HIGH (ref 180–914)

## 2021-05-03 LAB — CBC WITH DIFFERENTIAL/PLATELET
Abs Immature Granulocytes: 0.05 10*3/uL (ref 0.00–0.07)
Basophils Absolute: 0 10*3/uL (ref 0.0–0.1)
Basophils Relative: 1 %
Eosinophils Absolute: 0.3 10*3/uL (ref 0.0–0.5)
Eosinophils Relative: 3 %
HCT: 35.1 % — ABNORMAL LOW (ref 36.0–46.0)
Hemoglobin: 10.7 g/dL — ABNORMAL LOW (ref 12.0–15.0)
Immature Granulocytes: 1 %
Lymphocytes Relative: 23 %
Lymphs Abs: 2 10*3/uL (ref 0.7–4.0)
MCH: 21.9 pg — ABNORMAL LOW (ref 26.0–34.0)
MCHC: 30.5 g/dL (ref 30.0–36.0)
MCV: 71.9 fL — ABNORMAL LOW (ref 80.0–100.0)
Monocytes Absolute: 0.5 10*3/uL (ref 0.1–1.0)
Monocytes Relative: 6 %
Neutro Abs: 5.6 10*3/uL (ref 1.7–7.7)
Neutrophils Relative %: 66 %
Platelets: 349 10*3/uL (ref 150–400)
RBC: 4.88 MIL/uL (ref 3.87–5.11)
RDW: 16 % — ABNORMAL HIGH (ref 11.5–15.5)
WBC: 8.5 10*3/uL (ref 4.0–10.5)
nRBC: 0 % (ref 0.0–0.2)

## 2021-05-03 LAB — IRON AND TIBC
Iron: 54 ug/dL (ref 28–170)
Saturation Ratios: 17 % (ref 10.4–31.8)
TIBC: 327 ug/dL (ref 250–450)
UIBC: 273 ug/dL

## 2021-05-03 LAB — FERRITIN: Ferritin: 93 ng/mL (ref 11–307)

## 2021-05-03 LAB — VITAMIN D 25 HYDROXY (VIT D DEFICIENCY, FRACTURES): Vit D, 25-Hydroxy: 41.4 ng/mL (ref 30–100)

## 2021-05-03 LAB — FOLATE: Folate: 26.8 ng/mL (ref >5.9)

## 2021-05-04 DIAGNOSIS — E1159 Type 2 diabetes mellitus with other circulatory complications: Secondary | ICD-10-CM | POA: Diagnosis not present

## 2021-05-05 LAB — CMP14+EGFR
ALT: 16 IU/L (ref 0–32)
AST: 18 IU/L (ref 0–40)
Albumin/Globulin Ratio: 1.4 (ref 1.2–2.2)
Albumin: 3.9 g/dL (ref 3.8–4.8)
Alkaline Phosphatase: 137 IU/L — ABNORMAL HIGH (ref 44–121)
BUN/Creatinine Ratio: 14 (ref 12–28)
BUN: 18 mg/dL (ref 8–27)
Bilirubin Total: <0.2 mg/dL (ref 0.0–1.2)
CO2: 24 mmol/L (ref 20–29)
Calcium: 9.6 mg/dL (ref 8.7–10.3)
Chloride: 99 mmol/L (ref 96–106)
Creatinine, Ser: 1.25 mg/dL — ABNORMAL HIGH (ref 0.57–1.00)
Globulin, Total: 2.7 g/dL (ref 1.5–4.5)
Glucose: 89 mg/dL (ref 70–99)
Potassium: 4.8 mmol/L (ref 3.5–5.2)
Sodium: 140 mmol/L (ref 134–144)
Total Protein: 6.6 g/dL (ref 6.0–8.5)
eGFR: 46 mL/min/{1.73_m2} — ABNORMAL LOW (ref >59)

## 2021-05-05 LAB — HEMOGLOBIN A1C
Est. average glucose Bld gHb Est-mCnc: 160 mg/dL
Hgb A1c MFr Bld: 7.2 % — ABNORMAL HIGH (ref 4.8–5.6)

## 2021-05-06 ENCOUNTER — Other Ambulatory Visit: Payer: Self-pay | Admitting: Family Medicine

## 2021-05-06 LAB — METHYLMALONIC ACID, SERUM: Methylmalonic Acid, Quantitative: 154 nmol/L (ref 0–378)

## 2021-05-06 NOTE — Progress Notes (Signed)
D/c metformin

## 2021-05-07 ENCOUNTER — Other Ambulatory Visit: Payer: Self-pay

## 2021-05-07 ENCOUNTER — Other Ambulatory Visit: Payer: Self-pay | Admitting: Family Medicine

## 2021-05-07 DIAGNOSIS — I10 Essential (primary) hypertension: Secondary | ICD-10-CM

## 2021-05-07 LAB — COPPER, SERUM: Copper: 134 ug/dL (ref 80–158)

## 2021-05-08 ENCOUNTER — Ambulatory Visit (INDEPENDENT_AMBULATORY_CARE_PROVIDER_SITE_OTHER): Payer: Medicare HMO | Admitting: Pharmacist

## 2021-05-08 ENCOUNTER — Other Ambulatory Visit: Payer: Self-pay

## 2021-05-08 VITALS — BP 133/78 | HR 91

## 2021-05-08 DIAGNOSIS — E785 Hyperlipidemia, unspecified: Secondary | ICD-10-CM

## 2021-05-08 DIAGNOSIS — E1159 Type 2 diabetes mellitus with other circulatory complications: Secondary | ICD-10-CM

## 2021-05-08 DIAGNOSIS — I10 Essential (primary) hypertension: Secondary | ICD-10-CM

## 2021-05-08 NOTE — Chronic Care Management (AMB) (Signed)
Chronic Care Management Pharmacy Note  05/08/2021 Name:  Helen Mahoney MRN:  [id number] DOB:  [birth year]  Summary: Type 2 Diabetes Metformin recently discontinued by PCP Current glucose readings: fasting blood glucose: mostly 130s recently without hypoglycemia per verbal report. Patient did not bring blood glucose log or meter today. Continue current medications as above per primary care provider for now; however, will plan to add Trulicity 0.93 mg subcutaneously once weekly x4 doses then increase to Trulicity 1.5 mg subcutaneously once weekly thereafter. Can continue to titrate for further blood glucose control ro desired weight loss. Patient instructed to discontinue glipizide when starting Trulicity. Patient also instructed to let me know if hypoglycemia occurs since we may need to decrease insulin as well.  Based on patient's current household size and income, she qualifies for patient assistance. Will complete patient assistance applications for Trulicity through [employer]. Application completed, signed, and faxed in office today. Patient identified as a good candidate for a GLP-1 receptor agonist given reduction in cardiovascular disease, slowed chronic kidney disease progression, low risk of hypoglycemia, increased satiety , and weight loss. Patient denies a personal or family history of medullary thyroid carcinoma (MTC) or Multiple Endocrine Neoplasia syndrome type 2 (MEN 2). Patient also denies any history of pancreatitis or biliary disease. Patient asks about continuous glucose monitor. However, does not meet the insurance requirements for coverage at this time.    Hyperlipidemia Uncontrolled. LDL above goal of <70 due to very high risk given 10-year risk >20% per 2020 AACE/ACE guidelines. Triglycerides at goal of <150 per 2020 AACE/ACE guidelines. Current medications: rosuvastatin 40 mg by mouth once daily and ezetimibe 10 mg by mouth once daily Regimen recently changed from  pravastatin 80 mg by mouth daily to rosuvastatin + ezetimibe Taking medications as directed: no, has not yet received rosuvastatin and ezetimibe from mail order pharmacy yet. Still taking pravastatin 80 mg by mouth daily but is aware to switch when she receives new medications Re-check lipid panel in 4-12 weeks  Overweight/Obesity  Baseline weight: 241 lbs; most recent weight: 232 lbs Recommend that the patient emphasize lean proteins, fruits and vegetables, whole grains and increased fiber consumption, adequate hydration Recommend diet modification to induce energy deficit of 500 kcal/day or greater Encouraged regular aerobic exercise with a goal of 30 minutes five times per week (150 minutes per week) Plan to add Trulicity as above  Subjective: Helen Mahoney is an 71 y.o. year old female who is a primary patient of Fayrene Helper, MD.  The CCM team was consulted for assistance with disease management and care coordination needs.    Engaged with patient face to face for initial visit in response to provider referral for pharmacy case management and/or care coordination services.   Consent to Services:  The patient was given the following information about Chronic Care Management services today, agreed to services, and gave verbal consent: 1. CCM service includes personalized support from designated clinical staff supervised by the primary care provider, including individualized plan of care and coordination with other care providers 2. 24/7 contact phone numbers for assistance for urgent and routine care needs. 3. Service will only be billed when office clinical staff spend 20 minutes or more in a month to coordinate care. 4. Only one practitioner may furnish and bill the service in a calendar month. 5.The patient may stop CCM services at any time (effective at the end of the month) by phone call to the office staff. 6. The patient will  be responsible for cost sharing (co-pay) of up to  20% of the service fee (after annual deductible is met). Patient agreed to services and consent obtained.  Patient Care Team: Fayrene Helper, MD as PCP - General Danie Binder, MD (Inactive) as Consulting Physician (Gastroenterology) Patrici Ranks, MD (Inactive) as Consulting Physician (Hematology and Oncology) Eloise Harman, DO as Consulting Physician (Internal Medicine) Beryle Lathe, [facility] (Pharmacist)  Objective:  Lab Results  Component Value Date   CREATININE 1.25 (H) 05/04/2021   CREATININE 1.19 (H) 02/01/2021   CREATININE 1.05 (H) 10/03/2020    Lab Results  Component Value Date   HGBA1C 7.2 (H) 05/04/2021   Last diabetic Eye exam:  Lab Results  Component Value Date/Time   HMDIABEYEEXA No Retinopathy 03/22/2019 12:00 AM    Last diabetic Foot exam:  Lab Results  Component Value Date/Time   HMDIABFOOTEX yes 02/13/2010 12:00 AM        Component Value Date/Time   CHOL 158 02/01/2021 0803   TRIG 70 02/01/2021 0803   HDL 52 02/01/2021 0803   CHOLHDL 3.0 02/01/2021 0803   CHOLHDL 3.3 03/30/2019 0914   VLDL 29 11/04/2016 1033   LDLCALC 92 02/01/2021 0803   LDLCALC 106 (H) 03/30/2019 0914    Hepatic Function Latest Ref Rng & Units 05/04/2021 02/01/2021 10/03/2020  Total Protein 6.0 - 8.5 g/dL 6.6 6.5 7.0  Albumin 3.8 - 4.8 g/dL 3.9 3.8 3.4(L)  AST 0 - 40 IU/L _0 ALT 0 - 32 IU/L _1 Alk Phosphatase 44 - 121 IU/L 137(H) 139(H) 98  Total Bilirubin 0.0 - 1.2 mg/dL <0.2 0.2 0.6  Bilirubin, Direct 0.0 - 0.3 mg/dL - - -    Lab Results  Component Value Date/Time   TSH 1.790 12/16/2019 09:18 AM   TSH 2.57 10/12/2018 09:36 AM    CBC Latest Ref Rng & Units 05/03/2021 10/03/2020 08/14/2020  WBC 4.0 - 10.5 K/uL 8.5 8.2 7.7  Hemoglobin 12.0 - 15.0 g/dL 10.7(L) 10.3(L) 10.1(L)  Hematocrit 36.0 - 46.0 % 35.1(L) 35.2(L) 33.8(L)  Platelets 150 - 400 K/uL 349 346 382    Lab Results  Component Value Date/Time   VD25OH 41.40 05/03/2021 11:16 AM    VD25OH 48.31 10/03/2020 10:48 AM    Clinical ASCVD: No  The 10-year ASCVD risk score (Arnett DK, et al., 2019) is: 23.1%   Values used to calculate the score:     Age: 71 years     Sex: Female     Is Non-Hispanic African American: Yes     Diabetic: Yes     Tobacco smoker: No     Systolic Blood Pressure: 099 mmHg     Is BP treated: Yes     HDL Cholesterol: 52 mg/dL     Total Cholesterol: 158 mg/dL    Social History   Tobacco Use  Smoking Status Never  Smokeless Tobacco Never   BP Readings from Last 3 Encounters:  05/08/21 133/78  05/01/21 131/74  02/23/21 120/68   Pulse Readings from Last 3 Encounters:  05/08/21 91  05/01/21 96  02/23/21 96   Wt Readings from Last 3 Encounters:  05/01/21 232 lb 1.3 oz (105.3 kg)  02/23/21 237 lb (107.5 kg)  02/07/21 238 lb (108 kg)    Assessment: Review of patient past medical history, allergies, medications, health status, including review of consultants reports, laboratory and other test data, was performed as part of comprehensive evaluation and provision of chronic  care management services.   SDOH:  (Social Determinants of Health) assessments and interventions performed:  SDOH Interventions    Flowsheet Row Most Recent Value  SDOH Interventions   SDOH Interventions for the Following Domains Financial Strain  Financial Strain Interventions Other (Comment)  [Medication Assistance Application Submitted]       CCM Care Plan  Allergies  Allergen Reactions   Tetanus Toxoids     Can't walk the next day(happened twice)    Penicillins Rash    Has patient had a PCN reaction causing immediate rash, facial/tongue/throat swelling, SOB or lightheadedness with hypotension: Yes/No:[id number] Has patient had a PCN reaction causing severe rash involving mucus membranes or skin necrosis: Yes/No:[id number] rash Has patient had a PCN reaction that required hospitalization Yes/No:[id number] Has patient had a PCN reaction occurring  within the last 10 years: Yes/No:[id number] If all of the above answers are NO, then may proceed with Cephalos    Medications Reviewed Today     Reviewed by Beryle Lathe, [facility] (Pharmacist) on 05/08/21 at 1000  Med List Status: <None>   Medication Order Taking? Sig Documenting Provider Last Dose Status Informant  Alcohol Swabs (DROPSAFE ALCOHOL PREP) 70 % PADS [id number] No USE AS DIRECTED THREE TIMES DAILY Fayrene Helper, MD Unknown Active   aspirin 81 MG tablet [id number] Yes Take 81 mg by mouth daily. [provider] Taking Active Self  Blood Glucose Calibration (TRUE METRIX LEVEL 1) Low SOLN [id number]  USE AS DIRECTED AS NEEDED Fayrene Helper, MD  Active   Cholecalciferol (VITAMIN D3) 2000 units TABS [id number] Yes Take 2,000 Units by mouth daily. [provider] Taking Active Self  DROPLET PEN NEEDLES 31G X 8 MM MISC [id number]  USE EVERY DAY AS DIRECTED. Fayrene Helper, MD  Active   ezetimibe (ZETIA) 10 MG tablet [id number] No Take 1 tablet (10 mg total) by mouth daily.  Patient not taking: Reported on 05/08/2021   Fayrene Helper, MD Not Taking Active            Med Note Waldo Laine, [place] May 08, 2021 10:00 AM) Has not received from [employer] Yet  ferrous sulfate 325 (65 FE) MG tablet [id number] Yes Take 325 mg by mouth daily with breakfast. [provider] Taking Active Self  folic acid (FOLVITE) 825 MCG tablet [id number] Yes Take 800 mcg by mouth daily. [provider] Taking Active Self  glipiZIDE (GLUCOTROL) 10 MG tablet [id number] Yes TAKE 1 TABLET TWICE DAILY BEFORE MEALS Fayrene Helper, MD Taking Active   LANTUS SOLOSTAR 100 UNIT/ML Solostar Pen [id number] Yes INJECT 50 UNITS SUBCUTANEOUSLY ONCE DAILY AT 10 PM Fayrene Helper, MD Taking Active   lisinopril-hydrochlorothiazide (ZESTORETIC) 20-12.5 MG tablet [id number] Yes Take two tablets by mouth once daily for blood pressure Fayrene Helper, MD Taking Active   rosuvastatin (CRESTOR) 40 MG tablet [id number] No Take 1 tablet (40 mg total) by mouth daily.  Patient not taking: Reported on 05/08/2021   Fayrene Helper, MD Not Taking Active            Med Note Waldo Laine, [place] May 08, 2021 10:00 AM) Has not received from [employer] Yet. Still taking pravastatin 80 mg by mouth daily  TRUE METRIX BLOOD GLUCOSE TEST test strip [id number]  TEST BLOOD SUGAR THREE TIMES DAILY Fayrene Helper, MD  Active   TRUEplus Lancets 33G MISC [id number]  USE TO TEST BLOOD SUGAR THREE  TIMES DAILY Fayrene Helper, MD  Active   vitamin B-12 (CYANOCOBALAMIN) 1000 MCG tablet [id number] Yes Take 1,000 mcg by mouth daily. [provider] Taking Active Self  Zinc 50 MG TABS [id number] Yes Take 1 tablet by mouth daily as needed. [provider] Taking Active Self            Patient Active Problem List   Diagnosis Date Noted   Type 2 diabetes mellitus with vascular disease ([place]) 10/26/2018   Osteoarthritis of right knee 07/04/2015   B12 deficiency 06/16/2015   Alpha-0- thalassemia trait/carrier 05/18/2015   FH: colon cancer 04/25/2015   Vitamin D deficiency 10/18/2014   IDA (iron deficiency anemia) 10/18/2014   Hyperlipidemia LDL goal <70 08/20/2007   Morbid obesity ([place]) 08/20/2007   Essential hypertension 08/20/2007    Immunization History  Administered Date(s) Administered   Fluad Quad(high Dose 65+) 01/11/2019, 01/03/2021   Influenza Split 02/14/2011, 03/18/2012   Influenza Whole 02/03/2007, 01/25/2008, 01/20/2009, 01/04/2010   Influenza, High Dose Seasonal PF 07/01/2018   Influenza,inj,Quad PF,6+ Mos 02/18/2013, 03/14/2014, 01/12/2015, 02/22/2016, 01/27/2017, 12/18/2017, 12/16/2019   Moderna Sars-Covid-2 Vaccination 06/11/2019, 07/09/2019, 02/21/2020, 08/22/2020   Pneumococcal Conjugate-13 07/13/2014   Pneumococcal Polysaccharide-23 09/14/2009, 09/11/2015   Zoster, Live  10/16/2010    Conditions to be addressed/monitored: HTN, HLD, DMII, and obesity  Care Plan : Medication Management  Updates made by Beryle Lathe, [place] since 05/08/2021 12:00 AM     Problem: T2DM, HTN, HLD, Obesity   Priority: High  Onset Date: 05/08/2021     Long-Range Goal: Disease Progression Prevention   Start Date: 05/08/2021  Expected End Date: 08/06/2021  This Visit's Progress: On track  Priority: High  Note:   Current Barriers:  Unable to independently monitor therapeutic efficacy Unable to achieve control of diabetes and hyperlipidemia  Pharmacist Clinical Goal(s):  Through collaboration with PharmD and provider, patient will  Achieve adherence to monitoring guidelines and medication adherence to achieve therapeutic efficacy Achieve control of diabetes and hyperlipidemia as evidenced by improved fasting blood sugar, improved A1c, and improved LDL   Interventions: 1:1 collaboration with Fayrene Helper, MD regarding development and update of comprehensive plan of care as evidenced by provider attestation and co-signature Inter-disciplinary care team collaboration (see longitudinal plan of care) Comprehensive medication review performed; medication list updated in electronic medical record  Type 2 Diabetes - New goal.: Uncontrolled; Most recent A1c above goal of <7% per ADA guidelines Current medications: glipizide IR 10 mg by mouth twice daily with meals and insulin glargine (Lantus) 50 units subcutaneously once daily Metformin recently discontinued by PCP Intolerances: none Taking medications as directed: yes Side effects thought to be attributed to current medication regimen: no Denies hypoglycemic symptoms (sweaty and shaky). Hypoglycemia prevention: not indicated at this time Current meal patterns: not discussed today Current exercise: not discussed today On a statin: yes On aspirin 81 mg daily: yes Last microalbumin/creatinine ratio: 19 (08/14/20);  on an ACEi/ARB: yes Last eye exam: overdue Last foot exam: completed within last year Current glucose readings: fasting blood glucose: mostly 130s recently without hypoglycemia per verbal report. Patient did not bring blood glucose log or meter today. Continue current medications as above per primary care provider for now; however, will plan to add Trulicity 4.91 mg subcutaneously once weekly x4 doses then increase to Trulicity 1.5 mg subcutaneously once weekly thereafter. Can continue to titrate for further blood glucose control ro desired weight loss. Patient instructed to discontinue glipizide when starting Trulicity. Patient also instructed to  let me know if hypoglycemia occurs since we may need to decrease insulin as well.  Based on patient's current household size and income, she qualifies for patient assistance. Will complete patient assistance applications for Trulicity through [employer]. Application completed, signed, and faxed in office today. Instructed to monitor blood sugars once a day at the following times: fasting (at least 8 hours since last food consumption) and whenever patient experiences symptoms of hypo/hyperglycemia  Discussed management of hypoglycemia. If blood sugar <70 at any time, treat with simple sugar such as 1/2 cup juice or regular soda or 3-4 glucose tablets. Recheck blood sugar in 15 minutes and repeat if blood sugar remains <70.  Encouraged ophthalmology/optometry appointment. Recommended yearly. Patient identified as a good candidate for a GLP-1 receptor agonist given reduction in cardiovascular disease, slowed chronic kidney disease progression, low risk of hypoglycemia, increased satiety , and weight loss. Patient denies a personal or family history of medullary thyroid carcinoma (MTC) or Multiple Endocrine Neoplasia syndrome type 2 (MEN 2). Patient also denies any history of pancreatitis or biliary disease. Patient asks about continuous glucose monitor. However,  does not meet the insurance requirements for coverage at this time.    Hypertension - New goal.: Blood pressure under good control. Blood pressure is at goal of <130/80 mmHg per 2017 AHA/ACC guidelines. Patient had not taken blood pressure medications this morning before clinic visit. Current medications: lisinopril-HCTZ 40-71m by mouth once daily (dose increased Oct '22) Intolerances: none Taking medications as directed: yes Side effects thought to be attributed to current medication regimen: no Denies dizziness and lightheadedness. Current home blood pressure: does not check Continue current medications as above Encourage dietary sodium restriction/DASH diet Recommend home blood pressure monitoring to discuss at next visit  Hyperlipidemia - New goal.: Uncontrolled. LDL above goal of <70 due to very high risk given 10-year risk >20% per 2020 AACE/ACE guidelines. Triglycerides at goal of <150 per 2020 AACE/ACE guidelines. Current medications: rosuvastatin 40 mg by mouth once daily and ezetimibe 10 mg by mouth once daily Regimen recently changed from pravastatin 80 mg by mouth daily to rosuvastatin + ezetimibe Intolerances: none Taking medications as directed: no, has not yet received rosuvastatin and ezetimibe from mail order pharmacy yet. Still taking pravastatin 80 mg by mouth daily but is aware to switch when she receives new medications Side effects thought to be attributed to current medication regimen: no Continue current medications as above Encourage dietary reduction of high fat containing foods such as butter, nuts, bacon, egg yolks, etc. Discussed need for and importance of continued work on weight loss Re-check lipid panel in 4-12 weeks  Overweight/Obesity - New goal.: Unable to achieve goal weight loss through lifestyle modification alone Current treatment:  none   Medications previously tried:  none Strategies previously tried: unknown History of bariatric surgery:  none Baseline weight: 241 lbs; most recent weight: 232 lbs Recommend that the patient emphasize lean proteins, fruits and vegetables, whole grains and increased fiber consumption, adequate hydration Recommend diet modification to induce energy deficit of 500 kcal/day or greater Encouraged regular aerobic exercise with a goal of 30 minutes five times per week (150 minutes per week) Plan to add Trulicity as above  Patient Goals/Self-Care Activities Patient will:  Take medications as prescribed Check blood sugar once a day at the following times: fasting (at least 8 hours since last food consumption) and whenever patient experiences symptoms of hypo/hyperglycemia, document, and provide at future appointments Check blood pressure at least once daily, document, and  provide at future appointments Engage in dietary modifications by fewer sweetened foods & beverages  Follow Up Plan: Telephone follow up appointment with care management team member scheduled for: 06/07/21      Medication Assistance: Application for Trulicity  medication assistance program. in process.  Anticipated assistance start date 06/05/21.  See plan of care for additional detail.  Patient's preferred pharmacy is:  [facility] [address] [place] - [place]  [address] [place] [address] Phone: [phone] Fax: [phone]  [facility] - [place], [place] [address] Phone: [phone] Fax: [phone]  Follow Up:  Patient agrees to Care Plan and Follow-up.  Plan: Telephone follow up appointment with care management team member scheduled for:  06/07/21  Kennon Holter, PharmD, [place], [place] Clinical Pharmacist Practitioner [facility] [phone]

## 2021-05-08 NOTE — Patient Instructions (Addendum)
Helen Mahoney,  It was great to talk to you today!  Please call me with any questions or concerns.   Visit Information   Following is a copy of your full care plan:  Care Plan : Medication Management  Updates made by Beryle Lathe, Edgar since 05/08/2021 12:00 AM     Problem: T2DM, HTN, HLD, Obesity   Priority: High  Onset Date: 05/08/2021     Long-Range Goal: Disease Progression Prevention   Start Date: 05/08/2021  Expected End Date: 08/06/2021  This Visit's Progress: On track  Priority: High  Note:   Current Barriers:  Unable to independently monitor therapeutic efficacy Unable to achieve control of diabetes and hyperlipidemia  Pharmacist Clinical Goal(s):  Through collaboration with PharmD and provider, patient will  Achieve adherence to monitoring guidelines and medication adherence to achieve therapeutic efficacy Achieve control of diabetes and hyperlipidemia as evidenced by improved fasting blood sugar, improved A1c, and improved LDL   Interventions: 1:1 collaboration with Helen Helper, MD regarding development and update of comprehensive plan of care as evidenced by provider attestation and co-signature Inter-disciplinary care team collaboration (see longitudinal plan of care) Comprehensive medication review performed; medication list updated in electronic medical record  Type 2 Diabetes - New goal.: Uncontrolled; Most recent A1c above goal of <7% per ADA guidelines Current medications: glipizide IR 10 mg by mouth twice daily with meals and insulin glargine (Lantus) 50 units subcutaneously once daily Metformin recently discontinued by PCP Intolerances: none Taking medications as directed: yes Side effects thought to be attributed to current medication regimen: no Denies hypoglycemic symptoms (sweaty and shaky). Hypoglycemia prevention: not indicated at this time Current meal patterns: not discussed today Current exercise: not discussed today On  a statin: yes On aspirin 81 mg daily: yes Last microalbumin/creatinine ratio: 19 (08/14/20); on an ACEi/ARB: yes Last eye exam: overdue Last foot exam: completed within last year Current glucose readings: fasting blood glucose: mostly 130s recently without hypoglycemia per verbal report. Patient did not bring blood glucose log or meter today. Continue current medications as above per primary care provider for now; however, will plan to add Trulicity 7.59 mg subcutaneously once weekly x4 doses then increase to Trulicity 1.5 mg subcutaneously once weekly thereafter. Can continue to titrate for further blood glucose control ro desired weight loss. Patient instructed to discontinue glipizide when starting Trulicity. Patient also instructed to let me know if hypoglycemia occurs since we may need to decrease insulin as well.  Based on patient's current household size and income, she qualifies for patient assistance. Will complete patient assistance applications for Trulicity through [employer]. Application completed, signed, and faxed in office today. Instructed to monitor blood sugars once a day at the following times: fasting (at least 8 hours since last food consumption) and whenever patient experiences symptoms of hypo/hyperglycemia  Discussed management of hypoglycemia. If blood sugar <70 at any time, treat with simple sugar such as 1/2 cup juice or regular soda or 3-4 glucose tablets. Recheck blood sugar in 15 minutes and repeat if blood sugar remains <70.  Encouraged ophthalmology/optometry appointment. Recommended yearly. Patient identified as a good candidate for a GLP-1 receptor agonist given reduction in cardiovascular disease, slowed chronic kidney disease progression, low risk of hypoglycemia, increased satiety , and weight loss. Patient denies a personal or family history of medullary thyroid carcinoma (MTC) or Multiple Endocrine Neoplasia syndrome type 2 (MEN 2). Patient also denies any history of  pancreatitis or biliary disease. Patient asks about continuous glucose monitor.  However, does not meet the insurance requirements for coverage at this time.    Hypertension - New goal.: Blood pressure under good control. Blood pressure is at goal of <130/80 mmHg per 2017 AHA/ACC guidelines. Patient had not taken blood pressure medications this morning before clinic visit. Current medications: lisinopril-HCTZ 40-60m by mouth once daily (dose increased Oct '22) Intolerances: none Taking medications as directed: yes Side effects thought to be attributed to current medication regimen: no Denies dizziness and lightheadedness. Current home blood pressure: does not check Continue current medications as above Encourage dietary sodium restriction/DASH diet Recommend home blood pressure monitoring to discuss at next visit  Hyperlipidemia - New goal.: Uncontrolled. LDL above goal of <70 due to very high risk given 10-year risk >20% per 2020 AACE/ACE guidelines. Triglycerides at goal of <150 per 2020 AACE/ACE guidelines. Current medications: rosuvastatin 40 mg by mouth once daily and ezetimibe 10 mg by mouth once daily Regimen recently changed from pravastatin 80 mg by mouth daily to rosuvastatin + ezetimibe Intolerances: none Taking medications as directed: no, has not yet received rosuvastatin and ezetimibe from mail order pharmacy yet. Still taking pravastatin 80 mg by mouth daily but is aware to switch when she receives new medications Side effects thought to be attributed to current medication regimen: no Continue current medications as above Encourage dietary reduction of high fat containing foods such as butter, nuts, bacon, egg yolks, etc. Discussed need for and importance of continued work on weight loss Re-check lipid panel in 4-12 weeks  Overweight/Obesity - New goal.: Unable to achieve goal weight loss through lifestyle modification alone Current treatment:  none   Medications  previously tried:  none Strategies previously tried: unknown History of bariatric surgery: none Baseline weight: 241 lbs; most recent weight: 232 lbs Recommend that the patient emphasize lean proteins, fruits and vegetables, whole grains and increased fiber consumption, adequate hydration Recommend diet modification to induce energy deficit of 500 kcal/day or greater Encouraged regular aerobic exercise with a goal of 30 minutes five times per week (150 minutes per week) Plan to add Trulicity as above  Patient Goals/Self-Care Activities Patient will:  Take medications as prescribed Check blood sugar once a day at the following times: fasting (at least 8 hours since last food consumption) and whenever patient experiences symptoms of hypo/hyperglycemia, document, and provide at future appointments Check blood pressure at least once daily, document, and provide at future appointments Engage in dietary modifications by fewer sweetened foods & beverages  Follow Up Plan: Telephone follow up appointment with care management team member scheduled for: 06/07/21      Consent to CCM Services: Ms. WCasswas given information about Chronic Care Management services including:  CCM service includes personalized support from designated clinical staff supervised by her physician, including individualized plan of care and coordination with other care providers 24/7 contact phone numbers for assistance for urgent and routine care needs. Service will only be billed when office clinical staff spend 20 minutes or more in a month to coordinate care. Only one practitioner may furnish and bill the service in a calendar month. The patient may stop CCM services at any time (effective at the end of the month) by phone call to the office staff. The patient will be responsible for cost sharing (co-pay) of up to 20% of the service fee (after annual deductible is met).  Patient agreed to services and verbal consent  obtained.   Plan: Telephone follow up appointment with care management team member scheduled for:  06/07/21  Kennon Holter, PharmD, BCACP, CPP Clinical Pharmacist Practitioner [facility] [phone]  Please call the care guide team at [phone] if you need to cancel or reschedule your appointment.   Print copy of patient instructions, educational materials, and care plan provided in person.

## 2021-05-09 NOTE — Progress Notes (Signed)
[place], [place] [address]   CLINIC:  Medical Oncology/Hematology  PCP:  Fayrene Helper, MD [address] [place] [place] [address] [phone]   REASON FOR VISIT:  Follow-up for alpha thalassemia minor, iron deficiency anemia, and B12 deficiency  PRIOR THERAPY: IV iron (last given April 2017)  CURRENT THERAPY: Oral iron supplementation  INTERVAL HISTORY:  Helen Mahoney 71 y.o. female returns for routine follow-up of her alpha thalassemia minor and iron deficiency anemia.  She was last seen by Tarri Abernethy PA-C on 10/10/2020.  At today's visit, she reports feeling well.  No recent hospitalizations, surgeries, or changes in baseline health status.  She denies any recent bleeding such as epistaxis, hematemesis, hematochezia, or melena.  She reports that her energy is at baseline with some very mild fatigue.  She denies any pica, restless legs, headaches, lightheadedness, or syncope.  No chest pain or dyspnea on exertion.  She continues to take her oral iron, B12, folate, and vitamin D supplements.  She has 70% energy and 100% appetite. She endorses that she is maintaining a stable weight.   REVIEW OF SYSTEMS:  Review of Systems  Constitutional:  Positive for fatigue (mild, at baseline). Negative for appetite change, chills, diaphoresis, fever and unexpected weight change.  HENT:   Negative for lump/mass and nosebleeds.   Eyes:  Negative for eye problems.  Respiratory:  Negative for cough, hemoptysis and shortness of breath.   Cardiovascular:  Negative for chest pain, leg swelling and palpitations.  Gastrointestinal:  Negative for abdominal pain, blood in stool, constipation, diarrhea, nausea and vomiting.  Genitourinary:  Negative for hematuria.   Skin: Negative.   Neurological:  Negative for dizziness, headaches and light-headedness.  Hematological:  Does not bruise/bleed easily.     PAST MEDICAL/SURGICAL HISTORY:   Past Medical History:  Diagnosis Date   [place] coronary artery calcium score less than 100    51 on coronary CT 03/2021   Alpha-0- thalassemia trait/carrier 05/18/2015   2013: TCS/EGD 2017: TCS/EGD HYPERPLASTIC GASTRIC POLYPS, LYMPHOCYTIC GASTRITIS    B12 deficiency 06/16/2015   Colon polyps    Diabetes mellitus, type 2 ([place])    Hyperlipidemia    Hypertension    Microcytic anemia 05/18/2015   2013: TCS/EGD 2017: TCS/EGD HYPERPLASTIC GASTRIC POLYPS, LYMPHOCYTIC GASTRITIS    Obesity    Past Surgical History:  Procedure Laterality Date   bilateral tubal ligation  1979   CHOLECYSTECTOMY  2001   ACUTE CHOLECYSTITIS/GALLSTONES   COLONOSCOPY  05/14/2011   Dr. Oneida Alar: sessile polyp in sigmoid colon, internal hemorrhoids, hyerplastic polyps   COLONOSCOPY N/A 05/15/2015   Procedure: COLONOSCOPY;  Surgeon: Danie Binder, MD;  [facility];  Service: Endoscopy;  Laterality: N/A;  0830   COLONOSCOPY W/ POLYPECTOMY  NOV 2008 [facility] ANEMIA   POLYP NO RETRIEVED   COLONOSCOPY W/ POLYPECTOMY  2006 DR. SMTH   POLYP?   COLONOSCOPY WITH PROPOFOL N/A 08/07/2020   Procedure: COLONOSCOPY WITH PROPOFOL;  Surgeon: Eloise Harman, DO;  [facility]: [facility];  Service: Endoscopy;  Laterality: N/A;  ASA II / AM procedure   ESOPHAGOGASTRODUODENOSCOPY  05/14/2011   Dr. Oneida Alar: sessile polyps in the cardia, mild gastritis. Chronic duodenitis consistent with peptic duodenitis, chronic active H.pylori gastritis.    ESOPHAGOGASTRODUODENOSCOPY N/A 05/15/2015   Procedure: ESOPHAGOGASTRODUODENOSCOPY (EGD);  Surgeon: Danie Binder, MD;  [facility];  Service: Endoscopy;  Laterality: N/A;   GIVENS CAPSULE STUDY N/A 06/02/2015  Procedure: GIVENS CAPSULE STUDY;  Surgeon: Danie Binder, MD;  [facility];  Service: Endoscopy;  Laterality: N/A;  0700   POLYPECTOMY  08/07/2020   Procedure: POLYPECTOMY;  Surgeon: Eloise Harman, DO;  [facility];  Service: Endoscopy;;    TUBAL LIGATION     UPPER GASTROINTESTINAL ENDOSCOPY  NOV 2008 [facility] ANEMIA   NL EXAM, urease neg     SOCIAL HISTORY:  Social History   Socioeconomic History   Marital status: Married    Spouse name: Not on file   Number of children: Not on file   Years of education: Not on file   Highest education level: Not on file  Occupational History   Not on file  Tobacco Use   Smoking status: Never   Smokeless tobacco: Never  Vaping Use   Vaping Use: Never used  Substance and Sexual Activity   Alcohol use: No   Drug use: No   Sexual activity: Not Currently  Other Topics Concern   Not on file  Social History Narrative   Not on file   Social Determinants of Health   Financial Resource Strain: Low Risk    Difficulty of Paying Living Expenses: Not hard at all  Food Insecurity: No Food Insecurity   Worried About Charity fundraiser in the Last Year: Never true   Ran Out of Food in the Last Year: Never true  Transportation Needs: No Transportation Needs   Lack of Transportation (Medical): No   Lack of Transportation (Non-Medical): No  Physical Activity: Not on file  Stress: No Stress Concern Present   Feeling of Stress : Not at all  Social Connections: Moderately Isolated   Frequency of Communication with Friends and Family: More than three times a week   Frequency of Social Gatherings with Friends and Family: More than three times a week   Attends Religious Services: More than 4 times per year   Active Member of [employer] or Organizations: No   Attends Archivist Meetings: Never   Marital Status: Widowed  Human resources officer Violence: Not on file    FAMILY HISTORY:  Family History  Problem Relation Age of Onset   Heart disease Mother    Diabetes Mother    Hypertension Mother    Stroke Mother    Breast cancer Mother    Diabetes Father    Heart attack Father    Kidney disease Father    Hypertension Sister    Diabetes Sister    Diabetes Sister    Mental illness Sister     Hypertension Sister    Hepatitis C Sister    Mental illness Sister    Diabetes Sister    Hypertension Sister    Mental illness Sister    Colon cancer Sister 35   Diabetes Brother     CURRENT MEDICATIONS:  Outpatient Encounter Medications as of 05/10/2021  Medication Sig Note   Alcohol Swabs (DROPSAFE ALCOHOL PREP) 70 % PADS USE AS DIRECTED THREE TIMES DAILY    aspirin 81 MG tablet Take 81 mg by mouth daily.    Blood Glucose Calibration (TRUE METRIX LEVEL 1) Low SOLN USE AS DIRECTED AS NEEDED    Cholecalciferol (VITAMIN D3) 2000 units TABS Take 2,000 Units by mouth daily.    DROPLET PEN NEEDLES 31G X 8 MM MISC USE EVERY DAY AS DIRECTED.    ezetimibe (ZETIA) 10 MG tablet Take 1 tablet (10 mg total) by mouth daily. (Patient not taking: Reported  on 05/08/2021) 05/08/2021: Has not received from [place] Yet   ferrous sulfate 325 (65 FE) MG tablet Take 325 mg by mouth daily with breakfast.    folic acid (FOLVITE) 267 MCG tablet Take 800 mcg by mouth daily.    glipiZIDE (GLUCOTROL) 10 MG tablet TAKE 1 TABLET TWICE DAILY BEFORE MEALS    LANTUS SOLOSTAR 100 UNIT/ML Solostar Pen INJECT 50 UNITS SUBCUTANEOUSLY ONCE DAILY AT 10 PM    lisinopril-hydrochlorothiazide (ZESTORETIC) 20-12.5 MG tablet Take two tablets by mouth once daily for blood pressure    rosuvastatin (CRESTOR) 40 MG tablet Take 1 tablet (40 mg total) by mouth daily. (Patient not taking: Reported on 05/08/2021) 05/08/2021: Has not received from [place] Yet. Still taking pravastatin 80 mg by mouth daily   TRUE METRIX BLOOD GLUCOSE TEST test strip TEST BLOOD SUGAR THREE TIMES DAILY    TRUEplus Lancets 33G MISC USE TO TEST BLOOD SUGAR THREE TIMES DAILY    vitamin B-12 (CYANOCOBALAMIN) 1000 MCG tablet Take 1,000 mcg by mouth daily.    Zinc 50 MG TABS Take 1 tablet by mouth daily as needed.    No facility-administered encounter medications on file as of 05/10/2021.    ALLERGIES:  Allergies   Allergen Reactions   Tetanus Toxoids     Can't walk the next day(happened twice)    Penicillins Rash    Has patient had a PCN reaction causing immediate rash, facial/tongue/throat swelling, SOB or lightheadedness with hypotension: Yes/No:[id number] Has patient had a PCN reaction causing severe rash involving mucus membranes or skin necrosis: Yes/No:[id number] rash Has patient had a PCN reaction that required hospitalization Yes/No:[id number] Has patient had a PCN reaction occurring within the last 10 years: Yes/No:[id number] If all of the above answers are NO, then may proceed with Cephalos     PHYSICAL EXAM:  ECOG PERFORMANCE STATUS: 0 - Asymptomatic  There were no vitals filed for this visit. There were no vitals filed for this visit. Physical Exam Constitutional:      Appearance: Normal appearance. She is obese.  HENT:     Head: Normocephalic and atraumatic.     Mouth/Throat:     Mouth: Mucous membranes are moist.  Eyes:     Extraocular Movements: Extraocular movements intact.     Pupils: Pupils are equal, round, and reactive to light.  Cardiovascular:     Rate and Rhythm: Normal rate and regular rhythm.     Pulses: Normal pulses.     Heart sounds: Normal heart sounds.  Pulmonary:     Effort: Pulmonary effort is normal.     Breath sounds: Normal breath sounds.  Abdominal:     General: Bowel sounds are normal.     Palpations: Abdomen is soft.     Tenderness: There is no abdominal tenderness.  Musculoskeletal:        General: No swelling.     Right lower leg: No edema.     Left lower leg: No edema.  Lymphadenopathy:     Cervical: No cervical adenopathy.  Skin:    General: Skin is warm and dry.  Neurological:     General: No focal deficit present.     Mental Status: She is alert and oriented to person, place, and time.  Psychiatric:        Mood and Affect: Mood normal.        Behavior: Behavior normal.     LABORATORY DATA:  I have reviewed the labs as  listed.  CBC  Component Value Date/Time   WBC 8.5 05/03/2021 1116   RBC 4.88 05/03/2021 1116   HGB 10.7 (L) 05/03/2021 1116   HGB 10.1 (L) 08/14/2020 0914   HCT 35.1 (L) 05/03/2021 1116   HCT 33.8 (L) 08/14/2020 0914   PLT 349 05/03/2021 1116   PLT 382 08/14/2020 0914   MCV 71.9 (L) 05/03/2021 1116   MCV 71 (L) 08/14/2020 0914   MCH 21.9 (L) 05/03/2021 1116   MCHC 30.5 05/03/2021 1116   RDW 16.0 (H) 05/03/2021 1116   RDW 16.0 (H) 08/14/2020 0914   LYMPHSABS 2.0 05/03/2021 1116   LYMPHSABS 2.4 08/14/2020 0914   MONOABS 0.5 05/03/2021 1116   EOSABS 0.3 05/03/2021 1116   EOSABS 0.4 08/14/2020 0914   BASOSABS 0.0 05/03/2021 1116   BASOSABS 0.1 08/14/2020 0914   CMP Latest Ref Rng & Units 05/04/2021 02/01/2021 10/03/2020  Glucose 70 - 99 mg/dL 89 60(L) 164(H)  BUN 8 - 27 mg/dL 18 17 14   Creatinine 0.57 - 1.00 mg/dL 1.25(H) 1.19(H) 1.05(H)  Sodium 134 - 144 mmol/L 140 139 136  Potassium 3.5 - 5.2 mmol/L 4.8 4.2 4.2  Chloride 96 - 106 mmol/L 99 103 102  CO2 20 - 29 mmol/L 24 22 26   Calcium 8.7 - 10.3 mg/dL 9.6 9.1 8.8(L)  Total Protein 6.0 - 8.5 g/dL 6.6 6.5 7.0  Total Bilirubin 0.0 - 1.2 mg/dL <0.2 0.2 0.6  Alkaline Phos 44 - 121 IU/L 137(H) 139(H) 98  AST 0 - 40 IU/L 18 20 22   ALT 0 - 32 IU/L 16 14 21     DIAGNOSTIC IMAGING:  I have independently reviewed the relevant imaging and discussed with the patient.  ASSESSMENT & PLAN: 1.  Microcytic anemia with alpha thalassemia minor - Longstanding microcytic anemia dating back to before 2008, alpha thalassemia genotype study done on 08/17/2015 - Ongoing mild (Hgb > 10.0) microcytic anemia attributable to alpha thalassemia trait - She has previously required IV Feraheme (last given April 2017), currently on daily oral iron supplement  - Most recent colonoscopy (08/07/2020): Nonbleeding internal hemorrhoids, diverticulosis, polyp x1 - EGD (05/15/2015): Gastric polyps, GE junction stricture - Capsule study (06/02/2015): Occasional  erosion in the duodenum.  No masses, ulcers, or AVMs seen.  Occasional lymphangiectasia. - SPEP checked in 2017 was normal.  Hemoccult stool checked in 2012 was negative. - Her baseline hemoglobin ranges from 9.5-11.0 - Most recent labs (05/03/2021): Hgb 10.7/MCV 71.9 (at baseline).  Ferritin 93, iron saturation 17% - No bright red blood per rectum or melena - PLAN: No indication for IV iron at this time.  Continue oral iron supplementation.  Repeat labs and RTC in 6 months.  2.  Vitamin B12 deficiency - Patient had been receiving monthly B12 injections since February 2017 - Labs in November 2021 showed vitamin B12 level > 7500, therefore injections were held - She was placed on oral B12 tablets in June 2022   - Most recent B12 level (05/03/2021) is 1387, normal methylmalonic acid - PLAN: Continue oral B12 supplementation.  Check B12/MMA at follow-up visit in 6 months.  3.  Folate deficiency - Labs on 10/03/2020 showed folate decreased at 5.4 - She was started on folic acid supplementation in June 2022 - Most recent folic acid is improved at 26.8. - PLAN: Continue daily folic acid supplementation.  We will recheck levels at follow-up visit in 6 months.  4.  Vitamin D deficiency - She takes vitamin D 2,000 units daily - Most recent vitamin D (05/03/2021): WNL at  41.40  - PLAN: Continue vitamin D 2000 units daily.  We will recheck levels at follow-up visit in 6 months.   PLAN SUMMARY & DISPOSITION: Labs in 6 months Phone visit in 6 months, after labs  All questions were answered. The patient knows to call the clinic with any problems, questions or concerns.  Medical decision making: Moderate  Time spent on visit: I spent 20 minutes counseling the patient face to face. The total time spent in the appointment was 30 minutes and more than 50% was on counseling.   Harriett Rush, PA-C  05/10/2021 11:41 AM

## 2021-05-10 ENCOUNTER — Inpatient Hospital Stay (HOSPITAL_COMMUNITY): Payer: Medicare HMO | Admitting: Physician Assistant

## 2021-05-10 ENCOUNTER — Other Ambulatory Visit: Payer: Self-pay

## 2021-05-10 VITALS — BP 131/75 | HR 86 | Temp 98.7°F | Resp 18 | Ht 66.0 in | Wt 237.0 lb

## 2021-05-10 DIAGNOSIS — D508 Other iron deficiency anemias: Secondary | ICD-10-CM | POA: Diagnosis not present

## 2021-05-10 DIAGNOSIS — E669 Obesity, unspecified: Secondary | ICD-10-CM | POA: Diagnosis not present

## 2021-05-10 DIAGNOSIS — D563 Thalassemia minor: Secondary | ICD-10-CM | POA: Diagnosis not present

## 2021-05-10 DIAGNOSIS — E538 Deficiency of other specified B group vitamins: Secondary | ICD-10-CM | POA: Diagnosis not present

## 2021-05-10 DIAGNOSIS — E559 Vitamin D deficiency, unspecified: Secondary | ICD-10-CM

## 2021-05-10 DIAGNOSIS — E119 Type 2 diabetes mellitus without complications: Secondary | ICD-10-CM | POA: Diagnosis not present

## 2021-05-10 DIAGNOSIS — E785 Hyperlipidemia, unspecified: Secondary | ICD-10-CM | POA: Diagnosis not present

## 2021-05-10 DIAGNOSIS — D509 Iron deficiency anemia, unspecified: Secondary | ICD-10-CM | POA: Diagnosis not present

## 2021-05-10 DIAGNOSIS — I10 Essential (primary) hypertension: Secondary | ICD-10-CM | POA: Diagnosis not present

## 2021-05-10 DIAGNOSIS — R5383 Other fatigue: Secondary | ICD-10-CM | POA: Diagnosis not present

## 2021-05-10 NOTE — Patient Instructions (Addendum)
[place] at [facility] Discharge Instructions  You were seen today by Tarri Abernethy PA-C for your thalassemia, anemia, iron deficiency, and other vitamin/mineral deficiencies.  All of your labs were at baseline today.  You do not need any IV iron or additional treatment at this time.  LABS: Return in 6 months for repeat labs  MEDICATIONS: - Continue iron pill once daily - Continue B12 (cyanocobalamin) once daily - Continue folic acid supplement once daily - Continue vitamin D supplement once daily  FOLLOW-UP APPOINTMENT: Phone visit in 6 months, after labs   Thank you for choosing [place] at [facility] to provide your oncology and hematology care.  To afford each patient quality time with our provider, please arrive at least 15 minutes before your scheduled appointment time.   If you have a lab appointment with the [place] please come in thru the Main Entrance and check in at the main information desk.  You need to re-schedule your appointment should you arrive 10 or more minutes late.  We strive to give you quality time with our providers, and arriving late affects you and other patients whose appointments are after yours.  Also, if you no show three or more times for appointments you may be dismissed from the clinic at the providers discretion.     Again, thank you for choosing [facility].  Our hope is that these requests will decrease the amount of time that you wait before being seen by our physicians.       _____________________________________________________________  Should you have questions after your visit to [facility], please contact our office at [phone] and follow the prompts.  Our office hours are 8:00 a.m. and 4:30 p.m. Monday - Friday.  Please note that voicemails left after 4:00 p.m. may not be returned until the following business day.  We are closed weekends and major  holidays.  You do have access to a nurse 24-7, just call the main number to the clinic [phone] and do not press any options, hold on the line and a nurse will answer the phone.    For prescription refill requests, have your pharmacy contact our office and allow 72 hours.    Due to Covid, you will need to wear a mask upon entering the hospital. If you do not have a mask, a mask will be given to you at the Main Entrance upon arrival. For doctor visits, patients may have 1 support person age 36 or older with them. For treatment visits, patients can not have anyone with them due to social distancing guidelines and our immunocompromised population.

## 2021-05-18 ENCOUNTER — Telehealth: Payer: Self-pay

## 2021-05-18 ENCOUNTER — Other Ambulatory Visit: Payer: Self-pay | Admitting: Family Medicine

## 2021-05-18 NOTE — Telephone Encounter (Signed)
Patient called the office she is still waiting on her new insulin to bring to her house as discuss with Harrell Gave, and still have not received it yet, its been a week. She is completing out, took last one last night.  Please contact patient at [phone].

## 2021-05-21 NOTE — Telephone Encounter (Signed)
Patient was reminded that we applied for Trulicity patient assistance program (not insulin). Therefore, she will need to continue getting refills of her insulin through her pharmacy as before. She reports she was able to refill it and has supply to continue therapy.   Kennon Holter, PharmD, [place], CPP Clinical Pharmacist Practitioner [facility] [phone]

## 2021-05-24 ENCOUNTER — Ambulatory Visit: Payer: Medicare HMO

## 2021-05-28 ENCOUNTER — Telehealth: Payer: Self-pay | Admitting: Family Medicine

## 2021-05-28 NOTE — Telephone Encounter (Signed)
Pt is calling to question the lipid, and why would she need to see 2 different pharmacist   Please call the pt back --

## 2021-05-29 DIAGNOSIS — E785 Hyperlipidemia, unspecified: Secondary | ICD-10-CM

## 2021-05-29 DIAGNOSIS — E1159 Type 2 diabetes mellitus with other circulatory complications: Secondary | ICD-10-CM | POA: Diagnosis not present

## 2021-05-29 DIAGNOSIS — I10 Essential (primary) hypertension: Secondary | ICD-10-CM

## 2021-05-30 NOTE — Telephone Encounter (Signed)
Per chris here at our office patient will continue to follow with him and the other appointment will be canceled patient is aware

## 2021-06-05 DIAGNOSIS — I10 Essential (primary) hypertension: Secondary | ICD-10-CM | POA: Diagnosis not present

## 2021-06-06 LAB — BMP8+EGFR
BUN/Creatinine Ratio: 14 (ref 12–28)
BUN: 19 mg/dL (ref 8–27)
CO2: 22 mmol/L (ref 20–29)
Calcium: 9.4 mg/dL (ref 8.7–10.3)
Chloride: 102 mmol/L (ref 96–106)
Creatinine, Ser: 1.4 mg/dL — ABNORMAL HIGH (ref 0.57–1.00)
Glucose: 217 mg/dL — ABNORMAL HIGH (ref 70–99)
Potassium: 4.5 mmol/L (ref 3.5–5.2)
Sodium: 141 mmol/L (ref 134–144)
eGFR: 40 mL/min/{1.73_m2} — ABNORMAL LOW (ref >59)

## 2021-06-07 ENCOUNTER — Ambulatory Visit (INDEPENDENT_AMBULATORY_CARE_PROVIDER_SITE_OTHER): Payer: Medicare HMO | Admitting: Pharmacist

## 2021-06-07 DIAGNOSIS — E785 Hyperlipidemia, unspecified: Secondary | ICD-10-CM

## 2021-06-07 DIAGNOSIS — E1159 Type 2 diabetes mellitus with other circulatory complications: Secondary | ICD-10-CM

## 2021-06-07 DIAGNOSIS — I10 Essential (primary) hypertension: Secondary | ICD-10-CM

## 2021-06-07 NOTE — Chronic Care Management (AMB) (Signed)
Chronic Care Management Pharmacy Note  06/07/2021 Name:  Helen Mahoney MRN:  [id number] DOB:  1951-03-14  Summary: Type 2 Diabetes Patient has been approved for Trulicity through [facility] patient assistance program until 04/28/22. Received first shipment yesterday (1 box of 0.75 mg and 3 boxes of 1.5 mg). Patient plans to start taking Trulicity 9.62 mg subcutaneously once weekly every Sunday starting on 06/10/21.  Fasting blood glucose mostly 110-115 over last few days without hypoglycemia per verbal report. Continue insulin glargine (Lantus) 50 units subcutaneously once daily for now. Since we are starting GLP-1 agonist, I anticipate that we will need to decrease basal insulin at some point. Patient instructed to monitor finger stick blood glucose closely and let me know if fasting blood glucose consistently <100 or if hypoglycemia occurs Start Trulicity 9.52 mg subcutaneously once weekly x4 doses then increase to Trulicity 1.5 mg subcutaneously once weekly thereafter. Can continue to titrate for further blood glucose control or desired weight loss Discontinue glipizide when starting Trulicity (planned for 8/41/32).   Hyperlipidemia Uncontrolled. LDL above goal of <70 due to very high risk given 10-year risk >20% per 2020 AACE/ACE guidelines. Current medications: rosuvastatin 40 mg by mouth once daily and ezetimibe 10 mg by mouth once daily Regimen recently changed from pravastatin 80 mg by mouth daily to rosuvastatin + ezetimibe. As a result consultation with cardiovascular clinic lipid pharmacist cancelled. Re-check lipid panel in 4-12 weeks. Will place order at next visit.  Overweight/Obesity  Baseline weight: 440 lbs Start Trulicity 1.02 mg subcutaneously once weekly x4 doses then increase to Trulicity 1.5 mg subcutaneously once weekly thereafter. Can continue to titrate for further blood glucose control or desired weight loss  Subjective: Helen Mahoney is an 71 y.o.  year old female who is a primary patient of Fayrene Helper, MD.  The CCM team was consulted for assistance with disease management and care coordination needs.    Engaged with patient by telephone for follow up visit in response to provider referral for pharmacy case management and/or care coordination services.   Consent to Services:  The patient was given information about Chronic Care Management services, agreed to services, and gave verbal consent prior to initiation of services.  Please see initial visit note for detailed documentation.   Patient Care Team: Fayrene Helper, MD as PCP - General Danie Binder, MD (Inactive) as Consulting Physician (Gastroenterology) Patrici Ranks, MD (Inactive) as Consulting Physician (Hematology and Oncology) Eloise Harman, DO as Consulting Physician (Internal Medicine) Beryle Lathe, [facility] (Pharmacist)  Objective:  Lab Results  Component Value Date   CREATININE 1.40 (H) 06/05/2021   CREATININE 1.25 (H) 05/04/2021   CREATININE 1.19 (H) 02/01/2021    Lab Results  Component Value Date   HGBA1C 7.2 (H) 05/04/2021   Last diabetic Eye exam:  Lab Results  Component Value Date/Time   HMDIABEYEEXA No Retinopathy 03/22/2019 12:00 AM    Last diabetic Foot exam:  Lab Results  Component Value Date/Time   HMDIABFOOTEX yes 02/13/2010 12:00 AM        Component Value Date/Time   CHOL 158 02/01/2021 0803   TRIG 70 02/01/2021 0803   HDL 52 02/01/2021 0803   CHOLHDL 3.0 02/01/2021 0803   CHOLHDL 3.3 03/30/2019 0914   VLDL 29 11/04/2016 1033   LDLCALC 92 02/01/2021 0803   LDLCALC 106 (H) 03/30/2019 0914    Hepatic Function Latest Ref Rng & Units 05/04/2021 02/01/2021 10/03/2020  Total Protein 6.0 - 8.5  g/dL 6.6 6.5 7.0  Albumin 3.8 - 4.8 g/dL 3.9 3.8 3.4(L)  AST 0 - 40 IU/L '18 20 22  ' ALT 0 - 32 IU/L '16 14 21  ' Alk Phosphatase 44 - 121 IU/L 137(H) 139(H) 98  Total Bilirubin 0.0 - 1.2 mg/dL <0.2 0.2 0.6  Bilirubin, Direct  0.0 - 0.3 mg/dL - - -    Lab Results  Component Value Date/Time   TSH 1.790 12/16/2019 09:18 AM   TSH 2.57 10/12/2018 09:36 AM    CBC Latest Ref Rng & Units 05/03/2021 10/03/2020 08/14/2020  WBC 4.0 - 10.5 K/uL 8.5 8.2 7.7  Hemoglobin 12.0 - 15.0 g/dL 10.7(L) 10.3(L) 10.1(L)  Hematocrit 36.0 - 46.0 % 35.1(L) 35.2(L) 33.8(L)  Platelets 150 - 400 K/uL 349 346 382    Lab Results  Component Value Date/Time   VD25OH 41.40 05/03/2021 11:16 AM   VD25OH 48.31 10/03/2020 10:48 AM    Clinical ASCVD: No  The 10-year ASCVD risk score (Arnett DK, et al., 2019) is: 23.5%   Values used to calculate the score:     Age: 74 years     Sex: Female     Is Non-Hispanic African American: Yes     Diabetic: Yes     Tobacco smoker: No     Systolic Blood Pressure: 811 mmHg     Is BP treated: Yes     HDL Cholesterol: 52 mg/dL     Total Cholesterol: 158 mg/dL    Social History   Tobacco Use  Smoking Status Never  Smokeless Tobacco Never   BP Readings from Last 3 Encounters:  05/10/21 131/75  05/08/21 133/78  05/01/21 131/74   Pulse Readings from Last 3 Encounters:  05/10/21 86  05/08/21 91  05/01/21 96   Wt Readings from Last 3 Encounters:  05/10/21 236 lb 15.9 oz (107.5 kg)  05/01/21 232 lb 1.3 oz (105.3 kg)  02/23/21 237 lb (107.5 kg)    Assessment: Review of patient past medical history, allergies, medications, health status, including review of consultants reports, laboratory and other test data, was performed as part of comprehensive evaluation and provision of chronic care management services.   SDOH:  (Social Determinants of Health) assessments and interventions performed:    CCM Care Plan  Allergies  Allergen Reactions   Tetanus Toxoids     Can't walk the next day(happened twice)    Penicillins Rash    Has patient had a PCN reaction causing immediate rash, facial/tongue/throat swelling, SOB or lightheadedness with hypotension: Yes/No:[id number] Has patient had a PCN  reaction causing severe rash involving mucus membranes or skin necrosis: Yes/No:[id number] rash Has patient had a PCN reaction that required hospitalization Yes/No:[id number] Has patient had a PCN reaction occurring within the last 10 years: Yes/No:[id number] If all of the above answers are NO, then may proceed with Cephalos    Medications Reviewed Today     Reviewed by Beryle Lathe, [facility] (Pharmacist) on 06/07/21 at 1510  Med List Status: <None>   Medication Order Taking? Sig Documenting Provider Last Dose Status Informant  Alcohol Swabs (DROPSAFE ALCOHOL PREP) 70 % PADS [id number] Yes USE AS DIRECTED THREE TIMES DAILY Fayrene Helper, MD Taking Active   aspirin 81 MG tablet [id number] Yes Take 81 mg by mouth daily. [provider] Taking Active Self  Blood Glucose Calibration (TRUE METRIX LEVEL 1) Low SOLN [id number] Yes USE AS DIRECTED AS NEEDED Fayrene Helper, MD Taking Active   Cholecalciferol (VITAMIN D3) 2000 units TABS [id number] Yes  Take 2,000 Units by mouth daily. [provider] Taking Active Self  DROPLET PEN NEEDLES 31G X 8 MM MISC [id number] Yes USE EVERY DAY AS DIRECTED. Fayrene Helper, MD Taking Active   Dulaglutide (TRULICITY) 6.30 ZS/0.1UX [place] [id number] No Inject 0.75 mg into the skin once a week. For 4 doses then increase to 1.5 mg subcutaneously weekly  Patient not taking: Reported on 06/07/2021   [provider] Not Taking Active Self           Med Note Jim Like Jun 07, 2021  3:07 PM) Frances Maywood from [place] patient assistance program. Plans to start on 06/10/21.  Dulaglutide (TRULICITY) 1.5 GU/5.4YH SOPN [id number] No Inject 1.5 mg into the skin once a week.  Patient not taking: Reported on 06/07/2021   [provider] Not Taking Active Self           Med Note Jim Like Jun 07, 2021  3:07 PM) Frances Maywood from [place] patient assistance program. Dose increase planned for 07/08/21.   ezetimibe (ZETIA) 10 MG tablet [id number] Yes Take 1 tablet (10 mg total) by mouth daily. Fayrene Helper, MD Taking Active            Med Note Jim Like Jun 07, 2021  3:08 PM)    ferrous sulfate 325 (65 FE) MG tablet [id number] Yes Take 325 mg by mouth daily with breakfast. [provider] Taking Active Self  folic acid (FOLVITE) 854 MCG tablet [id number] Yes Take 800 mcg by mouth daily. [provider] Taking Active Self  LANTUS SOLOSTAR 100 UNIT/ML Solostar Pen [id number] Yes INJECT 50 UNITS SUBCUTANEOUSLY ONCE DAILY AT  10:00 PM Fayrene Helper, MD Taking Active   lisinopril-hydrochlorothiazide (ZESTORETIC) 20-12.5 MG tablet [id number] Yes Take two tablets by mouth once daily for blood pressure Fayrene Helper, MD Taking Active   rosuvastatin (CRESTOR) 40 MG tablet [id number] Yes Take 1 tablet (40 mg total) by mouth daily. Fayrene Helper, MD Taking Active            Med Note Jim Like Jun 07, 2021  3:08 PM)    TRUE METRIX BLOOD GLUCOSE TEST test strip [id number] Yes TEST BLOOD SUGAR THREE TIMES DAILY Fayrene Helper, MD Taking Active   TRUEplus Lancets 33G [place] [id number] Yes USE TO TEST BLOOD SUGAR THREE TIMES DAILY Fayrene Helper, MD Taking Active   vitamin B-12 (CYANOCOBALAMIN) 1000 MCG tablet [id number] Yes Take 1,000 mcg by mouth daily. [provider] Taking Active Self            Patient Active Problem List   Diagnosis Date Noted   Type 2 diabetes mellitus with vascular disease ([place]) 10/26/2018   Osteoarthritis of right knee 07/04/2015   B12 deficiency 06/16/2015   Alpha-0- thalassemia trait/carrier 05/18/2015   FH: colon cancer 04/25/2015   Vitamin D deficiency 10/18/2014   IDA (iron deficiency anemia) 10/18/2014   Hyperlipidemia LDL goal <70 08/20/2007   Morbid obesity ([place]) 08/20/2007   Essential hypertension 08/20/2007    Immunization History  Administered Date(s) Administered    Fluad Quad(high Dose 65+) 01/11/2019, 01/03/2021   Influenza Split 02/14/2011, 03/18/2012   Influenza Whole 02/03/2007, 01/25/2008, 01/20/2009, 01/04/2010   Influenza, High Dose Seasonal PF 07/01/2018   Influenza,inj,Quad PF,6+ Mos 02/18/2013, 03/14/2014, 01/12/2015, 02/22/2016, 01/27/2017, 12/18/2017, 12/16/2019   Moderna Sars-Covid-2 Vaccination 06/11/2019, 07/09/2019, 02/21/2020, 08/22/2020   Pneumococcal Conjugate-13 07/13/2014   Pneumococcal Polysaccharide-23  09/14/2009, 09/11/2015   Zoster, Live 10/16/2010    Conditions to be addressed/monitored: HTN, HLD, DMII, and obesity  Care Plan : Medication Management  Updates made by Beryle Lathe, [place] since 06/07/2021 12:00 AM     Problem: T2DM, HTN, HLD, Obesity   Priority: High  Onset Date: 05/08/2021     Long-Range Goal: Disease Progression Prevention   Start Date: 05/08/2021  Expected End Date: 08/06/2021  Recent Progress: On track  Priority: High  Note:   Current Barriers:  Unable to independently monitor therapeutic efficacy Unable to achieve control of diabetes and hyperlipidemia  Pharmacist Clinical Goal(s):  Through collaboration with PharmD and provider, patient will  Achieve adherence to monitoring guidelines and medication adherence to achieve therapeutic efficacy Achieve control of diabetes and hyperlipidemia as evidenced by improved fasting blood sugar, improved A1c, and improved LDL   Interventions: 1:1 collaboration with Fayrene Helper, MD regarding development and update of comprehensive plan of care as evidenced by provider attestation and co-signature Inter-disciplinary care team collaboration (see longitudinal plan of care) Comprehensive medication review performed; medication list updated in electronic medical record  Type 2 Diabetes - Goal on Track (progressing): YES.: Uncontrolled; Most recent A1c above goal of <7% per ADA guidelines Current medications: glipizide IR 10 mg by mouth twice  daily with meals and insulin glargine (Lantus) 50 units subcutaneously once daily Patient has been approved for Trulicity through [employer] patient assistance program until 04/28/22. Received first shipment yesterday (1 box of 0.75 mg and 3 boxes of 1.5 mg). Patient plans to start taking Trulicity 0.10 mg subcutaneously once weekly every Sunday starting on 06/10/21.  Intolerances: none, but will avoid metformin due to glomerular filtration rate Taking medications as directed: yes Side effects thought to be attributed to current medication regimen: no Denies hypoglycemic symptoms (sweaty and shaky). Hypoglycemia prevention: not indicated at this time Current meal patterns: not discussed today Current exercise: not discussed today On a statin: yes On aspirin 81 mg daily: yes Last microalbumin/creatinine ratio: 19 (08/14/20); on an ACEi/ARB: yes Last eye exam: overdue Last foot exam: completed within last year Current glucose readings: fasting blood glucose: mostly 110-115 over last few days without hypoglycemia per verbal report. Continue insulin glargine (Lantus) 50 units subcutaneously once daily for now. Since we are starting GLP-1 agonist, I anticipate that we will need to decrease basal insulin at some point. Patient instructed to monitor finger stick blood glucose closely and let me know if fasting blood glucose consistently <100 or if hypoglycemia occurs Start Trulicity 2.72 mg subcutaneously once weekly x4 doses then increase to Trulicity 1.5 mg subcutaneously once weekly thereafter. Can continue to titrate for further blood glucose control or desired weight loss Discontinue glipizide when starting Trulicity (planned for 5/36/64).  Instructed to monitor blood sugars once a day at the following times: fasting (at least 8 hours since last food consumption) and whenever patient experiences symptoms of hypo/hyperglycemia  Discussed management of hypoglycemia. If blood sugar <70 at any time, treat  with simple sugar such as 1/2 cup juice or regular soda or 3-4 glucose tablets. Recheck blood sugar in 15 minutes and repeat if blood sugar remains <70.  Encouraged ophthalmology/optometry appointment. Recommended yearly. Patient asks about continuous glucose monitor. However, does not meet the insurance requirements for coverage at this time.    Hypertension - Condition stable. Not addressed this visit.: Blood pressure under good control. Blood pressure is at goal of <130/80 mmHg per 2017 AHA/ACC guidelines. Current medications: lisinopril-HCTZ 40-73m by mouth  once daily (dose increased Oct '22) Intolerances: none Taking medications as directed: yes Side effects thought to be attributed to current medication regimen: no Denies dizziness and lightheadedness. Current home blood pressure: does not check Continue current medications as above Encourage dietary sodium restriction/DASH diet Recommend home blood pressure monitoring to discuss at next visit  Hyperlipidemia - Goal on Track (progressing): YES.: Uncontrolled. LDL above goal of <70 due to very high risk given 10-year risk >20% per 2020 AACE/ACE guidelines. Triglycerides at goal of <150 per 2020 AACE/ACE guidelines. Current medications: rosuvastatin 40 mg by mouth once daily and ezetimibe 10 mg by mouth once daily Regimen recently changed from pravastatin 80 mg by mouth daily to rosuvastatin + ezetimibe. As a result consultation with cardiovascular clinic lipid pharmacist cancelled. Intolerances: none Taking medications as directed: yes Side effects thought to be attributed to current medication regimen: no Continue current medications as above Encourage dietary reduction of high fat containing foods such as butter, nuts, bacon, egg yolks, etc. Discussed need for and importance of continued work on weight loss Re-check lipid panel in 4-12 weeks. Will place order at next visit.  Overweight/Obesity - Goal on Track (progressing):  YES.: Unable to achieve goal weight loss through lifestyle modification alone Current treatment:  none   Medications previously tried:  none Strategies previously tried: unknown History of bariatric surgery: none Baseline weight: 236 lbs; most recent weight: 236 lbs Recommend that the patient emphasize lean proteins, fruits and vegetables, whole grains and increased fiber consumption, adequate hydration Recommend diet modification to induce energy deficit of 500 kcal/day or greater Encouraged regular aerobic exercise with a goal of 30 minutes five times per week (150 minutes per week) Start Trulicity 5.88 mg subcutaneously once weekly x4 doses then increase to Trulicity 1.5 mg subcutaneously once weekly thereafter. Can continue to titrate for further blood glucose control or desired weight loss  Patient Goals/Self-Care Activities Patient will:  Take medications as prescribed Check blood sugar once a day at the following times: fasting (at least 8 hours since last food consumption) and whenever patient experiences symptoms of hypo/hyperglycemia, document, and provide at future appointments Check blood pressure at least once daily, document, and provide at future appointments Engage in dietary modifications by fewer sweetened foods & beverages  Follow Up Plan: Telephone follow up appointment with care management team member scheduled for: 07/12/21      Medication Assistance:  Trulicity obtained through [facility] medication assistance program.  Enrollment ends 04/28/22  Patient's preferred pharmacy is:  [facility] [place], [place] - [address] [place] [address] Phone: [phone] Fax: [phone]  [place], [place] [address] Phone: [phone] Fax: [phone]  Follow Up:  Patient agrees to Care Plan and Follow-up.  Plan: Telephone follow up appointment  with care management team member scheduled for:  07/12/21  Kennon Holter, PharmD, [place], [place] Clinical Pharmacist Practitioner [facility] [phone]

## 2021-06-07 NOTE — Patient Instructions (Signed)
Helen Mahoney,  It was great to talk to you today!  Medication Changes: Start taking Trulicity 8.65 mg subcutaneously once weekly every Sunday for 4 weeks then increase to 1.5 mg subcutaneously once weekly every Sunday Discontinue glipizide when you start taking Trulicity  Other: Let me know if you start taking fasting blood glucose less than 100 or hypoglycemic events (blood glucose less than 70). If this happens, we may need to decrease your Lantus dose. Continue taking your new cholesterol medications (rosuvastatin 40 mg by mouth daily and ezetimibe 10 mg by mouth daily). At our next visit, I will order a repeat cholesterol check.  Please call me with any questions or concerns.  Visit Information  Following are the goals we discussed today:   Goals Addressed             This Visit's Progress    Medication Management       Patient Goals/Self-Care Activities Patient will:  Take medications as prescribed Check blood sugar once a day at the following times: fasting (at least 8 hours since last food consumption) and whenever patient experiences symptoms of hypo/hyperglycemia, document, and provide at future appointments Check blood pressure at least once daily, document, and provide at future appointments Engage in dietary modifications by fewer sweetened foods & beverages          Follow-up plan: Telephone follow up appointment with care management team member scheduled for:  07/12/21  Patient verbalizes understanding of instructions and care plan provided today and agrees to view in [place]. Active MyChart status confirmed with patient.    Please call the care guide team at [phone] if you need to cancel or reschedule your appointment.   Kennon Holter, PharmD, [place], CPP Clinical Pharmacist Practitioner [facility] [phone]

## 2021-06-08 ENCOUNTER — Other Ambulatory Visit: Payer: Self-pay | Admitting: Family Medicine

## 2021-06-08 DIAGNOSIS — N1832 Chronic kidney disease, stage 3b: Secondary | ICD-10-CM

## 2021-06-13 ENCOUNTER — Ambulatory Visit: Payer: Medicare HMO | Admitting: Pharmacist

## 2021-06-13 DIAGNOSIS — E785 Hyperlipidemia, unspecified: Secondary | ICD-10-CM

## 2021-06-13 DIAGNOSIS — E1159 Type 2 diabetes mellitus with other circulatory complications: Secondary | ICD-10-CM

## 2021-06-13 DIAGNOSIS — I10 Essential (primary) hypertension: Secondary | ICD-10-CM

## 2021-06-13 NOTE — Chronic Care Management (AMB) (Signed)
Chronic Care Management Pharmacy Note  06/13/2021 Name:  Helen Mahoney MRN:  [id number] DOB:  10-16-1950  Summary: Type 2 Diabetes Recent changes: patient has taken one dose Trulicity 0.75 mg subcutaneously so far. She also stopped glipizide as instructed.  Current glucose readings: patient called today stating that since she stopped glipizide, her blood glucose has increased to 200s fasting and 200-300s post-prandial. Continue insulin glargine (Lantus) 50 units subcutaneously once daily.  Patient instructed to restart glipizide 10 mg by mouth twice daily due to worsening blood glucose control. Patient instructed to monitor finger stick blood glucose closely and let me know if fasting blood glucose consistently <100 or if hypoglycemia occurs Continue Trulicity 0.75 mg subcutaneously once weekly x4 doses then increase to Trulicity 1.5 mg subcutaneously once weekly thereafter. Can continue to titrate for further blood glucose control or desired weight loss  Subjective: Helen Mahoney is an 71 y.o. year old female who is a primary patient of Kerri Perches, MD.  The CCM team was consulted for assistance with disease management and care coordination needs.    Engaged with patient by telephone for follow up visit in response to provider referral for pharmacy case management and/or care coordination services.   Consent to Services:  The patient was given information about Chronic Care Management services, agreed to services, and gave verbal consent prior to initiation of services.  Please see initial visit note for detailed documentation.   Patient Care Team: Kerri Perches, MD as PCP - Theresia Bough, MD (Inactive) as Consulting Physician (Gastroenterology) Allene Pyo, MD (Inactive) as Consulting Physician (Hematology and Oncology) Lanelle Bal, DO as Consulting Physician (Internal Medicine) Gavin Pound, [facility]  (Pharmacist)  Objective:  Lab Results  Component Value Date   CREATININE 1.40 (H) 06/05/2021   CREATININE 1.25 (H) 05/04/2021   CREATININE 1.19 (H) 02/01/2021    Lab Results  Component Value Date   HGBA1C 7.2 (H) 05/04/2021   Last diabetic Eye exam:  Lab Results  Component Value Date/Time   HMDIABEYEEXA No Retinopathy 03/22/2019 12:00 AM    Last diabetic Foot exam:  Lab Results  Component Value Date/Time   HMDIABFOOTEX yes 02/13/2010 12:00 AM        Component Value Date/Time   CHOL 158 02/01/2021 0803   TRIG 70 02/01/2021 0803   HDL 52 02/01/2021 0803   CHOLHDL 3.0 02/01/2021 0803   CHOLHDL 3.3 03/30/2019 0914   VLDL 29 11/04/2016 1033   LDLCALC 92 02/01/2021 0803   LDLCALC 106 (H) 03/30/2019 0914    Hepatic Function Latest Ref Rng & Units 05/04/2021 02/01/2021 10/03/2020  Total Protein 6.0 - 8.5 g/dL 6.6 6.5 7.0  Albumin 3.8 - 4.8 g/dL 3.9 3.8 8.4(X)  AST 0 - 40 IU/L 18 20 22   ALT 0 - 32 IU/L 16 14 21   Alk Phosphatase 44 - 121 IU/L 137(H) 139(H) 98  Total Bilirubin 0.0 - 1.2 mg/dL <3.2 0.2 0.6  Bilirubin, Direct 0.0 - 0.3 mg/dL - - -    Lab Results  Component Value Date/Time   TSH 1.790 12/16/2019 09:18 AM   TSH 2.57 10/12/2018 09:36 AM    CBC Latest Ref Rng & Units 05/03/2021 10/03/2020 08/14/2020  WBC 4.0 - 10.5 K/uL 8.5 8.2 7.7  Hemoglobin 12.0 - 15.0 g/dL 10.7(L) 10.3(L) 10.1(L)  Hematocrit 36.0 - 46.0 % 35.1(L) 35.2(L) 33.8(L)  Platelets 150 - 400 K/uL 349 346 382    Lab Results  Component Value Date/Time  VD25OH 41.40 05/03/2021 11:16 AM   VD25OH 48.31 10/03/2020 10:48 AM    Clinical ASCVD: No  The 10-year ASCVD risk score (Arnett DK, et al., 2019) is: 23.5%   Values used to calculate the score:     Age: 31 years     Sex: Female     Is Non-Hispanic African American: Yes     Diabetic: Yes     Tobacco smoker: No     Systolic Blood Pressure: 131 mmHg     Is BP treated: Yes     HDL Cholesterol: 52 mg/dL     Total Cholesterol: 158 mg/dL     Social History   Tobacco Use  Smoking Status Never  Smokeless Tobacco Never   BP Readings from Last 3 Encounters:  05/10/21 131/75  05/08/21 133/78  05/01/21 131/74   Pulse Readings from Last 3 Encounters:  05/10/21 86  05/08/21 91  05/01/21 96   Wt Readings from Last 3 Encounters:  05/10/21 236 lb 15.9 oz (107.5 kg)  05/01/21 232 lb 1.3 oz (105.3 kg)  02/23/21 237 lb (107.5 kg)    Assessment: Review of patient past medical history, allergies, medications, health status, including review of consultants reports, laboratory and other test data, was performed as part of comprehensive evaluation and provision of chronic care management services.   SDOH:  (Social Determinants of Health) assessments and interventions performed:    CCM Care Plan  Allergies  Allergen Reactions   Tetanus Toxoids     Can't walk the next day(happened twice)    Penicillins Rash    Has patient had a PCN reaction causing immediate rash, facial/tongue/throat swelling, SOB or lightheadedness with hypotension: Yes/No:[id number] Has patient had a PCN reaction causing severe rash involving mucus membranes or skin necrosis: Yes/No:[id number] rash Has patient had a PCN reaction that required hospitalization Yes/No:[id number] Has patient had a PCN reaction occurring within the last 10 years: Yes/No:[id number] If all of the above answers are NO, then may proceed with Cephalos    Medications Reviewed Today     Reviewed by Gavin Pound, [facility] (Pharmacist) on 06/13/21 at 1018  Med List Status: <None>   Medication Order Taking? Sig Documenting Provider Last Dose Status Informant  Alcohol Swabs (DROPSAFE ALCOHOL PREP) 70 % PADS [id number] Yes USE AS DIRECTED THREE TIMES DAILY Kerri Perches, MD Taking Active   aspirin 81 MG tablet [id number] Yes Take 81 mg by mouth daily. [provider] Taking Active Self  Blood Glucose Calibration (TRUE METRIX LEVEL 1) Low SOLN [id number] Yes USE AS  DIRECTED AS NEEDED Kerri Perches, MD Taking Active   Cholecalciferol (VITAMIN D3) 2000 units TABS [id number] Yes Take 2,000 Units by mouth daily. [provider] Taking Active Self  DROPLET PEN NEEDLES 31G X 8 MM MISC [id number] Yes USE EVERY DAY AS DIRECTED. Kerri Perches, MD Taking Active   Dulaglutide (TRULICITY) 0.75 MG/0.5ML Namon Cirri [id number] Yes Inject 0.75 mg into the skin once a week. For 4 doses then increase to 1.5 mg subcutaneously weekly [provider] Taking Active Self           Med Note Gavin Pound   Wed Jun 13, 2021 10:17 AM)    Dulaglutide (TRULICITY) 1.5 MG/0.5ML SOPN [id number] No Inject 1.5 mg into the skin once a week.  Patient not taking: Reported on 06/07/2021   [provider] Not Taking Active Self           Med Note Berle Mull, Jocelyne Reinertsen J  Thu Jun 07, 2021  3:07 PM) Dolores Lory from [employer] patient assistance program. Dose increase planned for 07/08/21.  ezetimibe (ZETIA) 10 MG tablet [id number] Yes Take 1 tablet (10 mg total) by mouth daily. Kerri Perches, MD Taking Active            Med Note Norma Fredrickson Jun 07, 2021  3:08 PM)    ferrous sulfate 325 (65 FE) MG tablet [id number] Yes Take 325 mg by mouth daily with breakfast. [provider] Taking Active Self  folic acid (FOLVITE) 800 MCG tablet [id number] Yes Take 800 mcg by mouth daily. [provider] Taking Active Self  glipiZIDE (GLUCOTROL) 10 MG tablet [id number] Yes Take 10 mg by mouth 2 (two) times daily before a meal. [provider] Taking Active   LANTUS SOLOSTAR 100 UNIT/ML Solostar Pen [id number] Yes INJECT 50 UNITS SUBCUTANEOUSLY ONCE DAILY AT  10:00 PM Kerri Perches, MD Taking Active   lisinopril-hydrochlorothiazide (ZESTORETIC) 20-12.5 MG tablet [id number] Yes Take two tablets by mouth once daily for blood pressure Kerri Perches, MD Taking Active   rosuvastatin (CRESTOR) 40 MG tablet [id number] Yes Take 1  tablet (40 mg total) by mouth daily. Kerri Perches, MD Taking Active            Med Note Norma Fredrickson Jun 07, 2021  3:08 PM)    TRUE METRIX BLOOD GLUCOSE TEST test strip [id number] Yes TEST BLOOD SUGAR THREE TIMES DAILY Kerri Perches, MD Taking Active   TRUEplus Lancets 33G MISC [id number] Yes USE TO TEST BLOOD SUGAR THREE TIMES DAILY Kerri Perches, MD Taking Active   vitamin B-12 (CYANOCOBALAMIN) 1000 MCG tablet [id number] Yes Take 1,000 mcg by mouth daily. [provider] Taking Active Self            Patient Active Problem List   Diagnosis Date Noted   Type 2 diabetes mellitus with vascular disease (HCC) 10/26/2018   Osteoarthritis of right knee 07/04/2015   B12 deficiency 06/16/2015   Alpha-0- thalassemia trait/carrier 05/18/2015   FH: colon cancer 04/25/2015   Vitamin D deficiency 10/18/2014   IDA (iron deficiency anemia) 10/18/2014   Hyperlipidemia LDL goal <70 08/20/2007   Morbid obesity (HCC) 08/20/2007   Essential hypertension 08/20/2007    Immunization History  Administered Date(s) Administered   Fluad Quad(high Dose 65+) 01/11/2019, 01/03/2021   Influenza Split 02/14/2011, 03/18/2012   Influenza Whole 02/03/2007, 01/25/2008, 01/20/2009, 01/04/2010   Influenza, High Dose Seasonal PF 07/01/2018   Influenza,inj,Quad PF,6+ Mos 02/18/2013, 03/14/2014, 01/12/2015, 02/22/2016, 01/27/2017, 12/18/2017, 12/16/2019   Moderna Sars-Covid-2 Vaccination 06/11/2019, 07/09/2019, 02/21/2020, 08/22/2020   Pneumococcal Conjugate-13 07/13/2014   Pneumococcal Polysaccharide-23 09/14/2009, 09/11/2015   Zoster, Live 10/16/2010    Conditions to be addressed/monitored: HTN, HLD, DMII, and obesity  Care Plan : Medication Management  Updates made by Gavin Pound, RPH since 06/13/2021 12:00 AM     Problem: T2DM, HTN, HLD, Obesity   Priority: High  Onset Date: 05/08/2021     Long-Range Goal: Disease Progression Prevention   Start Date:  05/08/2021  Expected End Date: 08/06/2021  Recent Progress: On track  Priority: High  Note:   Current Barriers:  Unable to independently monitor therapeutic efficacy Unable to achieve control of diabetes and hyperlipidemia  Pharmacist Clinical Goal(s):  Through collaboration with PharmD and provider, patient will  Achieve adherence to monitoring guidelines and medication adherence to achieve therapeutic efficacy Achieve control of diabetes and hyperlipidemia as evidenced by  improved fasting blood sugar, improved A1c, and improved LDL   Interventions: 1:1 collaboration with Kerri Perches, MD regarding development and update of comprehensive plan of care as evidenced by provider attestation and co-signature Inter-disciplinary care team collaboration (see longitudinal plan of care) Comprehensive medication review performed; medication list updated in electronic medical record  Type 2 Diabetes - Goal on Track (progressing): YES.: Uncontrolled; Most recent A1c above goal of <7% per ADA guidelines Current medications: dulaglutide (Trulicity) 0.75 mg subcutaneously weekly and insulin glargine (Lantus) 50 units subcutaneously once daily Patient has been approved for Trulicity through [employer] patient assistance program until 04/28/22.  Recent changes: patient has taken one dose Trulicity 0.75 mg subcutaneously so far. She also stopped glipizide as instructed.  Intolerances: none, but will avoid metformin due to glomerular filtration rate Taking medications as directed: yes Side effects thought to be attributed to current medication regimen: no Denies hypoglycemic symptoms (sweaty and shaky). Hypoglycemia prevention: not indicated at this time Current meal patterns: not discussed today Current exercise: not discussed today On a statin: yes On aspirin 81 mg daily: yes Last microalbumin/creatinine ratio: 19 (08/14/20); on an ACEi/ARB: yes Last eye exam: overdue Last foot exam: completed  within last year Current glucose readings: patient called today stating that since she stopped glipizide, her blood glucose has increased to 200s fasting and 200-300s post-prandial. Continue insulin glargine (Lantus) 50 units subcutaneously once daily.  Patient instructed to restart glipizide 10 mg by mouth twice daily due to worsening blood glucose control. Patient instructed to monitor finger stick blood glucose closely and let me know if fasting blood glucose consistently <100 or if hypoglycemia occurs Continue Trulicity 0.75 mg subcutaneously once weekly x4 doses then increase to Trulicity 1.5 mg subcutaneously once weekly thereafter. Can continue to titrate for further blood glucose control or desired weight loss Instructed to monitor blood sugars once a day at the following times: fasting (at least 8 hours since last food consumption) and whenever patient experiences symptoms of hypo/hyperglycemia  Discussed management of hypoglycemia. If blood sugar <70 at any time, treat with simple sugar such as 1/2 cup juice or regular soda or 3-4 glucose tablets. Recheck blood sugar in 15 minutes and repeat if blood sugar remains <70.  Encouraged ophthalmology/optometry appointment. Recommended yearly. Patient asks about continuous glucose monitor. However, does not meet the insurance requirements for coverage at this time.    Hypertension - Condition stable. Not addressed this visit.: Blood pressure under good control. Blood pressure is at goal of <130/80 mmHg per 2017 AHA/ACC guidelines. Current medications: lisinopril-HCTZ 40-25mg  by mouth once daily (dose increased Oct '22) Intolerances: none Taking medications as directed: yes Side effects thought to be attributed to current medication regimen: no Denies dizziness and lightheadedness. Current home blood pressure: does not check Continue current medications as above Encourage dietary sodium restriction/DASH diet Recommend home blood pressure  monitoring to discuss at next visit  Hyperlipidemia - Condition stable. Not addressed this visit.: Uncontrolled. LDL above goal of <70 due to very high risk given 10-year risk >20% per 2020 AACE/ACE guidelines. Triglycerides at goal of <150 per 2020 AACE/ACE guidelines. Current medications: rosuvastatin 40 mg by mouth once daily and ezetimibe 10 mg by mouth once daily Regimen recently changed from pravastatin 80 mg by mouth daily to rosuvastatin + ezetimibe. As a result consultation with cardiovascular clinic lipid pharmacist cancelled. Intolerances: none Taking medications as directed: yes Side effects thought to be attributed to current medication regimen: no Continue current medications as above Encourage dietary  reduction of high fat containing foods such as butter, nuts, bacon, egg yolks, etc. Discussed need for and importance of continued work on weight loss Re-check lipid panel in 4-12 weeks. Will place order at next visit.  Overweight/Obesity - Goal on Track (progressing): YES.: Unable to achieve goal weight loss through lifestyle modification alone Current treatment: dulaglutide (Trulicity) 0.75 mg subcutaneously weekly  Medications previously tried:  none Strategies previously tried: unknown History of bariatric surgery: none Baseline weight: 236 lbs; most recent weight: 236 lbs Recommend that the patient emphasize lean proteins, fruits and vegetables, whole grains and increased fiber consumption, adequate hydration Recommend diet modification to induce energy deficit of 500 kcal/day or greater Encouraged regular aerobic exercise with a goal of 30 minutes five times per week (150 minutes per week) Continue Trulicity 0.75 mg subcutaneously once weekly x4 doses then increase to Trulicity 1.5 mg subcutaneously once weekly thereafter. Can continue to titrate for further blood glucose control or desired weight loss  Patient Goals/Self-Care Activities Patient will:  Take medications  as prescribed Check blood sugar once a day at the following times: fasting (at least 8 hours since last food consumption) and whenever patient experiences symptoms of hypo/hyperglycemia, document, and provide at future appointments Check blood pressure at least once daily, document, and provide at future appointments Engage in dietary modifications by fewer sweetened foods & beverages  Follow Up Plan: Telephone follow up appointment with care management team member scheduled for: 07/12/21      Medication Assistance:  Trulicity obtained through [facility] medication assistance program.  Enrollment ends 04/28/22  Patient's preferred pharmacy is:  [id number] - [place], [place] - [address] [place] [address] Phone: [phone] Fax: [phone]  [facility] - [place], [place] - [address] [place] [address] Phone: [phone] Fax: [phone]  Follow Up:  Patient agrees to Care Plan and Follow-up.  Plan: Telephone follow up appointment with care management team member scheduled for:  07/12/21  Domenic Moras, PharmD, BCACP, CPP Clinical Pharmacist Practitioner [facility] [phone]

## 2021-06-13 NOTE — Patient Instructions (Signed)
[name],  It was great to talk to you today!  Please restart glipizide 10 mg by mouth twice daily before meals. Watch blood glucose glucose and let me know if continued trend of high blood glucose (>250) or low blood glucose (<100).  Please call me with any questions or concerns.  Visit Information  Following are the goals we discussed today:   Goals Addressed             This Visit's Progress    Medication Management       Patient Goals/Self-Care Activities Patient will:  Take medications as prescribed Check blood sugar once a day at the following times: fasting (at least 8 hours since last food consumption) and whenever patient experiences symptoms of hypo/hyperglycemia, document, and provide at future appointments Check blood pressure at least once daily, document, and provide at future appointments Engage in dietary modifications by fewer sweetened foods & beverages           Follow-up plan: Telephone follow up appointment with care management team member scheduled for:  07/12/21  Patient verbalizes understanding of instructions and care plan provided today and agrees to view in [place]. Active MyChart status confirmed with patient.    Please call the care guide team at [phone] if you need to cancel or reschedule your appointment.   Kennon Holter, PharmD, Para [place], CPP Clinical Pharmacist Practitioner [facility] [phone]

## 2021-06-14 ENCOUNTER — Other Ambulatory Visit: Payer: Self-pay | Admitting: Family Medicine

## 2021-06-14 ENCOUNTER — Telehealth: Payer: Medicare HMO

## 2021-06-17 ENCOUNTER — Other Ambulatory Visit: Payer: Self-pay | Admitting: Family Medicine

## 2021-06-26 DIAGNOSIS — I10 Essential (primary) hypertension: Secondary | ICD-10-CM

## 2021-06-26 DIAGNOSIS — E1159 Type 2 diabetes mellitus with other circulatory complications: Secondary | ICD-10-CM | POA: Diagnosis not present

## 2021-06-26 DIAGNOSIS — E785 Hyperlipidemia, unspecified: Secondary | ICD-10-CM | POA: Diagnosis not present

## 2021-06-27 ENCOUNTER — Encounter: Payer: Self-pay | Admitting: Internal Medicine

## 2021-06-27 ENCOUNTER — Other Ambulatory Visit: Payer: Self-pay

## 2021-06-27 ENCOUNTER — Ambulatory Visit (INDEPENDENT_AMBULATORY_CARE_PROVIDER_SITE_OTHER): Payer: Medicare HMO | Admitting: Internal Medicine

## 2021-06-27 ENCOUNTER — Ambulatory Visit: Payer: Medicare HMO

## 2021-06-27 DIAGNOSIS — Z7189 Other specified counseling: Secondary | ICD-10-CM | POA: Diagnosis not present

## 2021-06-27 DIAGNOSIS — R6889 Other general symptoms and signs: Secondary | ICD-10-CM

## 2021-06-27 DIAGNOSIS — Z20822 Contact with and (suspected) exposure to covid-19: Secondary | ICD-10-CM | POA: Diagnosis not present

## 2021-06-27 NOTE — Patient Instructions (Signed)
Please come to office for COVID testing today around 3 PM. ?

## 2021-06-27 NOTE — Progress Notes (Signed)
?  ? ?Virtual Visit via Telephone Note  ? ?This visit type was conducted due to national recommendations for restrictions regarding the COVID-19 Pandemic (e.g. social distancing) in an effort to limit this patient's exposure and mitigate transmission in our community.  Due to her co-morbid illnesses, this patient is at least at moderate risk for complications without adequate follow up.  This format is felt to be most appropriate for this patient at this time.  The patient did not have access to video technology/had technical difficulties with video requiring transitioning to audio format only (telephone).  All issues noted in this document were discussed and addressed.  No physical exam could be performed with this format.  ? ?Evaluation Performed:  Follow-up visit ? ?Date:  06/27/2021  ? ?ID:  Helen Mahoney, DOB 03-28-1951, MRN [id number] ? ?Patient Location: Home ?Provider Location: Office/Clinic ? ?Participants: Patient ?Location of Patient: Home ?Location of Provider: Telehealth ?Consent was obtain for visit to be over via telehealth. ?I verified that I am speaking with the correct person using two identifiers. ? ?PCP:  Fayrene Helper, MD  ? ?Chief Complaint: Exposure to COVID positive person ? ?History of Present Illness:   ? ?Helen Mahoney is a 71 y.o. female who has a televisit for concern for exposure to COVID-positive person.  Her sister was feeling tired over the weekend and she took her to hospital, where she tested positive for COVID.  Patient herself denies any fever, chills, nasal congestion, cough, dyspnea or wheezing currently.  Denies any change in smell or taste sensation currently. ? ?The patient does not have symptoms concerning for COVID-19 infection (fever, chills, cough, or new shortness of breath).  ? ?Past Medical, Surgical, Social History, Allergies, and Medications have been Reviewed. ? ?Past Medical History:  ?Diagnosis Date  ? Agatston coronary artery calcium score less  than 100   ? 51 on coronary CT 03/2021  ? Alpha-0- thalassemia trait/carrier 05/18/2015  ? 2013: TCS/EGD 2017: TCS/EGD HYPERPLASTIC GASTRIC POLYPS, LYMPHOCYTIC GASTRITIS   ? B12 deficiency 06/16/2015  ? Colon polyps   ? Diabetes mellitus, type 2 ([place])   ? Hyperlipidemia   ? Hypertension   ? Microcytic anemia 05/18/2015  ? 2013: TCS/EGD 2017: TCS/EGD HYPERPLASTIC GASTRIC POLYPS, LYMPHOCYTIC GASTRITIS   ? Obesity   ? ?Past Surgical History:  ?Procedure Laterality Date  ? bilateral tubal ligation  1979  ? CHOLECYSTECTOMY  2001  ? ACUTE CHOLECYSTITIS/GALLSTONES  ? COLONOSCOPY  05/14/2011  ? Dr. Oneida Alar: sessile polyp in sigmoid colon, internal hemorrhoids, hyerplastic polyps  ? COLONOSCOPY N/A 05/15/2015  ? Procedure: COLONOSCOPY;  Surgeon: Danie Binder, MD;  [facility];  Service: Endoscopy;  Laterality: N/A;  0830  ? COLONOSCOPY W/ POLYPECTOMY  NOV 2008 [facility] ANEMIA  ? POLYP NO RETRIEVED  ? COLONOSCOPY W/ POLYPECTOMY  2006 DR. SMTH  ? POLYP?  ? COLONOSCOPY WITH PROPOFOL N/A 08/07/2020  ? Procedure: COLONOSCOPY WITH PROPOFOL;  Surgeon: Eloise Harman, DO;  [facility];  Service: Endoscopy;  Laterality: N/A;  ASA II / AM procedure  ? ESOPHAGOGASTRODUODENOSCOPY  05/14/2011  ? Dr. Oneida Alar: sessile polyps in the cardia, mild gastritis. Chronic duodenitis consistent with peptic duodenitis, chronic active H.pylori gastritis.   ? ESOPHAGOGASTRODUODENOSCOPY N/A 05/15/2015  ? Procedure: ESOPHAGOGASTRODUODENOSCOPY (EGD);  Surgeon: Danie Binder, MD;  [facility];  Service: Endoscopy;  Laterality: N/A;  ? GIVENS CAPSULE STUDY N/A 06/02/2015  ? Procedure: GIVENS CAPSULE STUDY;  Surgeon: Danie Binder, MD;  [facility];  Service: Endoscopy;  Laterality: N/A;  0700  ? POLYPECTOMY  08/07/2020  ? Procedure: POLYPECTOMY;  Surgeon: Eloise Harman, DO;  [facility];  Service: Endoscopy;;  ? TUBAL LIGATION    ? UPPER GASTROINTESTINAL ENDOSCOPY  NOV 2008 [facility] ANEMIA  ? NL EXAM, urease  neg  ?  ? ?Current Meds  ?Medication Sig  ? Alcohol Swabs (DROPSAFE ALCOHOL PREP) 70 % PADS USE AS DIRECTED THREE TIMES DAILY  ? aspirin 81 MG tablet Take 81 mg by mouth daily.  ? Blood Glucose Calibration (TRUE METRIX LEVEL 1) Low SOLN USE AS DIRECTED AS NEEDED  ? Cholecalciferol (VITAMIN D3) 2000 units TABS Take 2,000 Units by mouth daily.  ? DROPLET PEN NEEDLES 31G X 8 MM MISC USE EVERY DAY AS DIRECTED.  ? Dulaglutide (TRULICITY) 2.87 OM/7.6HM SOPN Inject 0.75 mg into the skin once a week. For 4 doses then increase to 1.5 mg subcutaneously weekly  ? [START ON 07/08/2021] Dulaglutide (TRULICITY) 1.5 CN/4.7SJ SOPN Inject 1.5 mg into the skin once a week.  ? ezetimibe (ZETIA) 10 MG tablet Take 1 tablet (10 mg total) by mouth daily.  ? ferrous sulfate 325 (65 FE) MG tablet Take 325 mg by mouth daily with breakfast.  ? folic acid (FOLVITE) 628 MCG tablet Take 800 mcg by mouth daily.  ? glipiZIDE (GLUCOTROL) 10 MG tablet TAKE 1 TABLET TWICE DAILY BEFORE MEALS  ? LANTUS SOLOSTAR 100 UNIT/ML Solostar Pen INJECT 50 UNITS SUBCUTANEOUSLY ONCE DAILY AT 10 PM  ? lisinopril-hydrochlorothiazide (ZESTORETIC) 20-12.5 MG tablet Take two tablets by mouth once daily for blood pressure  ? rosuvastatin (CRESTOR) 40 MG tablet Take 1 tablet (40 mg total) by mouth daily.  ? TRUE METRIX BLOOD GLUCOSE TEST test strip TEST BLOOD SUGAR THREE TIMES DAILY  ? TRUEplus Lancets 33G MISC USE TO TEST BLOOD SUGAR THREE TIMES DAILY  ? vitamin B-12 (CYANOCOBALAMIN) 1000 MCG tablet Take 1,000 mcg by mouth daily.  ?  ? ?Allergies:   Tetanus toxoids and Penicillins  ? ?ROS:   ?Please see the history of present illness.    ? ?All other systems reviewed and are negative. ? ? ?Labs/Other Tests and Data Reviewed:   ? ?Recent Labs: ?10/03/2020: Magnesium 1.6 ?05/03/2021: Hemoglobin 10.7; Platelets 349 ?05/04/2021: ALT 16 ?06/05/2021: BUN 19; Creatinine, Ser 1.40; Potassium 4.5; Sodium 141  ? ?Recent Lipid Panel ?Lab Results  ?Component Value Date/Time  ? CHOL 158  02/01/2021 08:03 AM  ? TRIG 70 02/01/2021 08:03 AM  ? HDL 52 02/01/2021 08:03 AM  ? CHOLHDL 3.0 02/01/2021 08:03 AM  ? CHOLHDL 3.3 03/30/2019 09:14 AM  ? LDLCALC 92 02/01/2021 08:03 AM  ? LDLCALC 106 (H) 03/30/2019 09:14 AM  ? ? ?Wt Readings from Last 3 Encounters:  ?05/10/21 236 lb 15.9 oz (107.5 kg)  ?05/01/21 232 lb 1.3 oz (105.3 kg)  ?02/23/21 237 lb (107.5 kg)  ?  ? ?ASSESSMENT & PLAN:   ? ?Exposure to COVID-19 positive person ?Counseling about COVID-19 infection ?Check COVID RT-PCR ?Advised to contact if she starts having any symptoms such as fever, chills, cough, nasal congestion, dyspnea, wheezing, fatigue, change in taste or smell sensation, nausea or vomiting ?Advised to stay well-hydrated ? ?Time:   ?Today, I have spent 7 minutes reviewing the chart, including problem list, medications, and with the patient with telehealth technology discussing the above problems. ? ? ?Medication Adjustments/Labs and Tests Ordered: ?Current medicines are reviewed at length with the patient today.  Concerns  regarding medicines are outlined above.  ? ?Tests Ordered: ?No orders of the defined types were placed in this encounter. ? ? ?Medication Changes: ?No orders of the defined types were placed in this encounter. ? ? ? ?Note: This dictation was prepared with Dragon dictation along with smaller phrase technology. Similar sounding words can be transcribed inadequately or may not be corrected upon review. Any transcriptional errors that result from this process are unintentional.  ?  ? ? ?Disposition:  Follow up  ?Signed, ?Lindell Spar, MD  ?06/27/2021 10:52 AM    ? ?[facility] ?[facility] ?

## 2021-06-29 ENCOUNTER — Telehealth: Payer: Self-pay

## 2021-06-29 LAB — NOVEL CORONAVIRUS, NAA: SARS-CoV-2, NAA: NOT DETECTED

## 2021-06-29 NOTE — Telephone Encounter (Signed)
Patient asked if it is okay to go around her sister she has covid. Patient asked if Velna Hatchet please give her a call [phone]. ?

## 2021-06-29 NOTE — Telephone Encounter (Signed)
Aware to keep her hands washed and wear a mask when she is around her  ?

## 2021-07-12 ENCOUNTER — Ambulatory Visit (INDEPENDENT_AMBULATORY_CARE_PROVIDER_SITE_OTHER): Payer: Medicare HMO | Admitting: Pharmacist

## 2021-07-12 NOTE — Chronic Care Management (AMB) (Signed)
? ? ?Chronic Care Management ?Pharmacy Note ? ?07/12/2021 ?Name:  Helen Mahoney MRN:  [id number] DOB:  03/30/1951 ? ?Summary: ?Type 2 Diabetes ?Most recent A1c above goal of <7% per ADA guidelines ?Patient has been approved for Trulicity through [facility] patient assistance program until 04/28/22.  ?Recent changes: patient has completed 4 doses of Trulicity 6.07 mg subcutaneously once weekly and has now increased to 1.5 mg subcutaneously once weekly as instructed. She has also restarted glipizide as instructed due to hyperglycemia after discontinuation.   ?Current glucose readings: fasting blood glucose per verbal report has improved to 90s-110s after restarting glipizide without hypoglycemia  ?Continue dulaglutide (Trulicity) 1.5 mg subcutaneously weekly, insulin glargine (Lantus) 50 units subcutaneously once daily, and glipizide IR 10 mg by mouth twice daily before meals ?Recheck A1c at next primary care provider visit ? ?Hyperlipidemia ?LDL above goal of <70 due to very high risk given 10-year risk >20% per 2020 AACE/ACE guidelines ?Recent changes (January 2023): switched from pravastatin to rosuvastatin + ezetimibe ?Continue rosuvastatin 40 mg by mouth once daily and ezetimibe 10 mg by mouth once daily ?Re-check fasting lipid panel a few days before next primary care provider visit. Order placed today.  ? ?Overweight/Obesity ?Baseline weight: 236 lbs; most recent weight: 225 lbs ?Continue Trulicity 1.5 mg subcutaneously once weekly. Can continue to titrate for further blood glucose control or desired weight loss. ? ?Subjective: ?Helen Mahoney is an 71 y.o. year old female who is a primary patient of Moshe Cipro, Norwood Levo, MD.  The CCM team was consulted for assistance with disease management and care coordination needs.   ? ?Engaged with patient by telephone for follow up visit in response to provider referral for pharmacy case management and/or care coordination services.  ? ?Consent to Services:   ?The patient was given information about Chronic Care Management services, agreed to services, and gave verbal consent prior to initiation of services.  Please see initial visit note for detailed documentation.  ? ?Patient Care Team: ?Fayrene Helper, MD as PCP - General ?Danie Binder, MD (Inactive) as Consulting Physician (Gastroenterology) ?Penland, Kelby Fam, MD (Inactive) as Consulting Physician (Hematology and Oncology) ?Eloise Harman, DO as Consulting Physician (Internal Medicine) ?Beryle Lathe, [facility] (Pharmacist) ? ? ?Objective: ? ?Lab Results  ?Component Value Date  ? CREATININE 1.40 (H) 06/05/2021  ? CREATININE 1.25 (H) 05/04/2021  ? CREATININE 1.19 (H) 02/01/2021  ? ? ?Lab Results  ?Component Value Date  ? HGBA1C 7.2 (H) 05/04/2021  ? ?Last diabetic Eye exam:  ?Lab Results  ?Component Value Date/Time  ? HMDIABEYEEXA No Retinopathy 03/22/2019 12:00 AM  ?  ?Last diabetic Foot exam:  ?Lab Results  ?Component Value Date/Time  ? HMDIABFOOTEX yes 02/13/2010 12:00 AM  ?  ? ?   ?Component Value Date/Time  ? CHOL 158 02/01/2021 0803  ? TRIG 70 02/01/2021 0803  ? HDL 52 02/01/2021 0803  ? CHOLHDL 3.0 02/01/2021 0803  ? CHOLHDL 3.3 03/30/2019 0914  ? VLDL 29 11/04/2016 1033  ? Gem 92 02/01/2021 0803  ? [place] 106 (H) 03/30/2019 0914  ? ? ?Hepatic Function Latest Ref Rng & Units 05/04/2021 02/01/2021 10/03/2020  ?Total Protein 6.0 - 8.5 g/dL 6.6 6.5 7.0  ?Albumin 3.8 - 4.8 g/dL 3.9 3.8 3.4(L)  ?AST 0 - 40 IU/L '18 20 22  ' ?ALT 0 - 32 IU/L '16 14 21  ' ?Alk Phosphatase 44 - 121 IU/L 137(H) 139(H) 98  ?Total Bilirubin 0.0 - 1.2 mg/dL <0.2 0.2 0.6  ?Bilirubin,  Direct 0.0 - 0.3 mg/dL - - -  ? ? ?Lab Results  ?Component Value Date/Time  ? TSH 1.790 12/16/2019 09:18 AM  ? TSH 2.57 10/12/2018 09:36 AM  ? ? ?CBC Latest Ref Rng & Units 05/03/2021 10/03/2020 08/14/2020  ?WBC 4.0 - 10.5 K/uL 8.5 8.2 7.7  ?Hemoglobin 12.0 - 15.0 g/dL 10.7(L) 10.3(L) 10.1(L)  ?Hematocrit 36.0 - 46.0 % 35.1(L) 35.2(L) 33.8(L)  ?Platelets  150 - 400 K/uL 349 346 382  ? ? ?Lab Results  ?Component Value Date/Time  ? VD25OH 41.40 05/03/2021 11:16 AM  ? VD25OH 48.31 10/03/2020 10:48 AM  ? ? ?Clinical ASCVD: No  ?The 10-year ASCVD risk score (Arnett DK, et al., 2019) is: 23.5% ?  Values used to calculate the score: ?    Age: 71 years ?    Sex: Female ?    Is Non-Hispanic African American: Yes ?    Diabetic: Yes ?    Tobacco smoker: No ?    Systolic Blood Pressure: 158 mmHg ?    Is BP treated: Yes ?    HDL Cholesterol: 52 mg/dL ?    Total Cholesterol: 158 mg/dL   ? ? ?Social History  ? ?Tobacco Use  ?Smoking Status Never  ?Smokeless Tobacco Never  ? ?BP Readings from Last 3 Encounters:  ?05/10/21 131/75  ?05/08/21 133/78  ?05/01/21 131/74  ? ?Pulse Readings from Last 3 Encounters:  ?05/10/21 86  ?05/08/21 91  ?05/01/21 96  ? ?Wt Readings from Last 3 Encounters:  ?05/10/21 236 lb 15.9 oz (107.5 kg)  ?05/01/21 232 lb 1.3 oz (105.3 kg)  ?02/23/21 237 lb (107.5 kg)  ? ? ?Assessment: Review of patient past medical history, allergies, medications, health status, including review of consultants reports, laboratory and other test data, was performed as part of comprehensive evaluation and provision of chronic care management services.  ? ?SDOH:  (Social Determinants of Health) assessments and interventions performed:  ? ? ?CCM Care Plan ? ?Allergies  ?Allergen Reactions  ? Tetanus Toxoids   ?  Can't walk the next day(happened twice)   ? Penicillins Rash  ?  Has patient had a PCN reaction causing immediate rash, facial/tongue/throat swelling, SOB or lightheadedness with hypotension: Yes/No:[id number] ?Has patient had a PCN reaction causing severe rash involving mucus membranes or skin necrosis: Yes/No:[id number] rash ?Has patient had a PCN reaction that required hospitalization Yes/No:[id number] ?Has patient had a PCN reaction occurring within the last 10 years: Yes/No:[id number] ?If all of the above answers are NO, then may proceed with Cephalos   ? ? ?Medications Reviewed Today   ? ? Reviewed by Beryle Lathe, [facility] (Pharmacist) on 07/12/21 at 1506  Med List Status: <None>  ? ?Medication Order Taking? Sig Documenting Provider Last Dose Status Informant  ?Alcohol Swabs (DROPSAFE ALCOHOL PREP) 70 % PADS [id number] Yes USE AS DIRECTED THREE TIMES DAILY Fayrene Helper, MD Taking Active   ?aspirin 81 MG tablet [id number] Yes Take 81 mg by mouth daily. [provider] Taking Active Self  ?Blood Glucose Calibration (TRUE METRIX LEVEL 1) Low SOLN [id number] Yes USE AS DIRECTED AS NEEDED Fayrene Helper, MD Taking Active   ?Cholecalciferol (VITAMIN D3) 2000 units TABS [id number] Yes Take 2,000 Units by mouth daily. [provider] Taking Active Self  ?DROPLET PEN NEEDLES 31G X 8 MM MISC [id number] Yes USE EVERY DAY AS DIRECTED. Fayrene Helper, MD Taking Active   ?Dulaglutide (TRULICITY) 1.5 XY/3.3XO SOPN [id number] Yes Inject 1.5 mg into the skin once a week.  [provider] Taking Active Self  ?         ?Med Note Jim Like Jun 07, 2021  3:07 PM) Frances Maywood from [place] patient assistance program. Dose increase planned for 07/08/21.  ?ezetimibe (ZETIA) 10 MG tablet [id number] Yes Take 1 tablet (10 mg total) by mouth daily. Fayrene Helper, MD Taking Active   ?         ?Med Note Jim Like Jun 07, 2021  3:08 PM)    ?ferrous sulfate 325 (65 FE) MG tablet [id number] Yes Take 325 mg by mouth daily with breakfast. [provider] Taking Active Self  ?folic acid (FOLVITE) 471 MCG tablet [id number] Yes Take 800 mcg by mouth daily. [provider] Taking Active Self  ?glipiZIDE (GLUCOTROL) 10 MG tablet [id number] Yes TAKE 1 TABLET TWICE DAILY BEFORE MEALS Fayrene Helper, MD Taking Active   ?LANTUS SOLOSTAR 100 UNIT/ML Solostar Pen [id number] Yes INJECT 50 UNITS SUBCUTANEOUSLY ONCE DAILY AT 10 PM Fayrene Helper, MD Taking Active   ?lisinopril-hydrochlorothiazide  (ZESTORETIC) 20-12.5 MG tablet [id number] Yes Take two tablets by mouth once daily for blood pressure Fayrene Helper, MD Taking Active   ?rosuvastatin (CRESTOR) 40 MG tablet [id number] Yes Take 1 tablet (40 mg total) by mout

## 2021-07-12 NOTE — Patient Instructions (Signed)
Helen Mahoney, ? ?It was great to talk to you today! ? ?Please call me with any questions or concerns. ? ?Visit Information ? ?Following are the goals we discussed today:  ? Goals Addressed   ? ?  ?  ?  ?  ? This Visit's Progress  ?  Medication Management     ?  Patient Goals/Self-Care Activities ?Patient will:  ?Take medications as prescribed ?Check blood sugar once a day at the following times: fasting (at least 8 hours since last food consumption) and whenever patient experiences symptoms of hypo/hyperglycemia, document, and provide at future appointments ?Check blood pressure at least once daily, document, and provide at future appointments ?Engage in dietary modifications by fewer sweetened foods & beverages ? ? ? ? ?  ? ?  ?  ? ?Follow-up plan: Next PCP appointment scheduled for: 08/07/21 ? ?Patient verbalizes understanding of instructions and care plan provided today and agrees to view in [place]. Active MyChart status confirmed with patient.   ? ?Please call the care guide team at [phone] if you need to cancel or reschedule your appointment.  ? ?Kennon Holter, PharmD, BCACP, CPP ?Clinical Pharmacist Practitioner ?[place] ?[phone]  ?

## 2021-07-18 ENCOUNTER — Other Ambulatory Visit: Payer: Self-pay | Admitting: Family Medicine

## 2021-07-27 DIAGNOSIS — I10 Essential (primary) hypertension: Secondary | ICD-10-CM | POA: Diagnosis not present

## 2021-07-27 DIAGNOSIS — E785 Hyperlipidemia, unspecified: Secondary | ICD-10-CM | POA: Diagnosis not present

## 2021-07-27 DIAGNOSIS — E1159 Type 2 diabetes mellitus with other circulatory complications: Secondary | ICD-10-CM

## 2021-08-01 DIAGNOSIS — E785 Hyperlipidemia, unspecified: Secondary | ICD-10-CM | POA: Diagnosis not present

## 2021-08-02 LAB — LIPID PANEL
Chol/HDL Ratio: 2.1 ratio (ref 0.0–4.4)
Cholesterol, Total: 101 mg/dL (ref 100–199)
HDL: 47 mg/dL (ref >39)
LDL Chol Calc (NIH): 42 mg/dL (ref 0–99)
Triglycerides: 51 mg/dL (ref 0–149)
VLDL Cholesterol Cal: 12 mg/dL (ref 5–40)

## 2021-08-07 ENCOUNTER — Ambulatory Visit (INDEPENDENT_AMBULATORY_CARE_PROVIDER_SITE_OTHER): Payer: Medicare HMO | Admitting: Family Medicine

## 2021-08-07 ENCOUNTER — Encounter: Payer: Self-pay | Admitting: Family Medicine

## 2021-08-07 VITALS — BP 119/76 | HR 105 | Ht 66.0 in | Wt 224.0 lb

## 2021-08-07 DIAGNOSIS — I10 Essential (primary) hypertension: Secondary | ICD-10-CM | POA: Diagnosis not present

## 2021-08-07 DIAGNOSIS — E1159 Type 2 diabetes mellitus with other circulatory complications: Secondary | ICD-10-CM | POA: Diagnosis not present

## 2021-08-07 DIAGNOSIS — E559 Vitamin D deficiency, unspecified: Secondary | ICD-10-CM | POA: Diagnosis not present

## 2021-08-07 DIAGNOSIS — N1832 Chronic kidney disease, stage 3b: Secondary | ICD-10-CM | POA: Diagnosis not present

## 2021-08-07 DIAGNOSIS — E538 Deficiency of other specified B group vitamins: Secondary | ICD-10-CM

## 2021-08-07 DIAGNOSIS — Z1231 Encounter for screening mammogram for malignant neoplasm of breast: Secondary | ICD-10-CM

## 2021-08-07 DIAGNOSIS — D509 Iron deficiency anemia, unspecified: Secondary | ICD-10-CM | POA: Diagnosis not present

## 2021-08-07 DIAGNOSIS — E785 Hyperlipidemia, unspecified: Secondary | ICD-10-CM | POA: Diagnosis not present

## 2021-08-07 NOTE — Assessment & Plan Note (Signed)
Controlled, no change in medication  

## 2021-08-07 NOTE — Assessment & Plan Note (Signed)
Updated lab needed.  

## 2021-08-07 NOTE — Progress Notes (Signed)
? ?Helen Mahoney     MRN: [id number]      DOB: [birth year] ? ? ?HPI ?Helen Mahoney is here for follow up and re-evaluation of chronic medical conditions, medication management and review of any available recent lab and radiology data.  ?Preventive health is updated, specifically  Cancer screening and Immunization.   ?Questions or concerns regarding consultations or procedures which the PT has had in the interim are  addressed. ?The PT denies any adverse reactions to current medications since the last visit.  ?There are no new concerns.  ?There are no specific complaints  ?Denies polyuria, polydipsia, blurred vision , or hypoglycemic episodes. ? ? ?ROS ?Denies recent fever or chills. ?Denies sinus pressure, nasal congestion, ear pain or sore throat. ?Denies chest congestion, productive cough or wheezing. ?Denies chest pains, palpitations and leg swelling ?Denies abdominal pain, nausea, vomiting,diarrhea or constipation.   ?Denies dysuria, frequency, hesitancy or incontinence. ?Denies joint pain, swelling and limitation in mobility. ?Denies headaches, seizures, numbness, or tingling. ?Denies depression, anxiety or insomnia. ?Denies skin break down or rash. ? ? ?PE ? ?BP 119/76   Pulse (!) 105   Ht '5\' 6"'$  (1.676 m)   Wt 224 lb (101.6 kg)   SpO2 95%   BMI 36.15 kg/m?  ? ?Patient alert and oriented and in no cardiopulmonary distress. ? ?HEENT: No facial asymmetry, EOMI,     Neck supple . ? ?Chest: Clear to auscultation bilaterally. ? ?CVS: S1, S2 no murmurs, no S3.Regular rate. ? ?ABD: Soft non tender.  ? ?Ext: No edema ? ?MS: Adequate ROM spine, shoulders, hips and knees. ? ?Skin: Intact, no ulcerations or rash noted. ? ?Psych: Good eye contact, normal affect. Memory intact not anxious or depressed appearing. ? ?CNS: CN 2-12 intact, power,  normal throughout.no focal deficits noted. ? ? ?Assessment & Plan ? ?Essential hypertension ?Controlled, no change in medication ?DASH diet and commitment to daily physical  activity for a minimum of 30 minutes discussed and encouraged, as a part of hypertension management. ?The importance of attaining a healthy weight is also discussed. ? ? ?  08/07/2021  ?  9:49 AM 05/10/2021  ? 10:54 AM 05/08/2021  ?  9:50 AM 05/01/2021  ?  9:34 AM 02/23/2021  ?  9:29 AM 02/07/2021  ?  9:45 AM 10/10/2020  ? 10:03 AM  ?BP/Weight  ?Systolic BP 196 222 979 892 120 158 98  ?Diastolic BP 76 75 78 74 68 78 67  ?Wt. (Lbs) 224 236.99  232.08 237 238 234.1  ?BMI 36.15 kg/m2 38.25 kg/m2  37.46 kg/m2 38.25 kg/m2 38.41 kg/m2 36.67 kg/m2  ? ? ? ? ? ?Hyperlipidemia LDL goal <70 ?Hyperlipidemia:Low fat diet discussed and encouraged. ? ? ?Lipid Panel  ?Lab Results  ?Component Value Date  ? CHOL 101 08/01/2021  ? HDL 47 08/01/2021  ? [place] 42 08/01/2021  ? TRIG 51 08/01/2021  ? CHOLHDL 2.1 08/01/2021  ? ? ? ?Controlled, no med change ? ?Morbid obesity ? ?Patient re-educated about  the importance of commitment to a  minimum of 150 minutes of exercise per week as able. ? ?The importance of healthy food choices with portion control discussed, as well as eating regularly and within a 12 hour window most days. ?The need to choose "clean , green" food 50 to 75% of the time is discussed, as well as to make water the primary drink and set a goal of 64 ounces water daily. ? ?  ? ?  08/07/2021  ?  9:49 AM 05/10/2021  ? 10:54 AM 05/01/2021  ?  9:34 AM  ?Weight /BMI  ?Weight 224 lb 236 lb 15.9 oz 232 lb 1.3 oz  ?Height '5\' 6"'$  (1.676 m) '5\' 6"'$  (1.676 m) '5\' 6"'$  (1.676 m)  ?BMI 36.15 kg/m2 38.25 kg/m2 37.46 kg/m2  ? ? ?Marked improvement ? ?Type 2 diabetes mellitus with vascular disease ([place]) ?Helen Mahoney is reminded of the importance of commitment to daily physical activity for 30 minutes or more, as able and the need to limit carbohydrate intake to 30 to 60 grams per meal to help with blood sugar control.  ? ?The need to take medication as prescribed, test blood sugar as directed, and to call between visits if there is a concern that blood  sugar is uncontrolled is also discussed.  ? ?Helen Mahoney is reminded of the importance of daily foot exam, annual eye examination, and good blood sugar, blood pressure and cholesterol control. ? ? ?  Latest Ref Rng & Units 08/01/2021  ?  8:49 AM 06/05/2021  ?  8:07 AM 05/04/2021  ?  8:07 AM 02/01/2021  ?  8:03 AM 10/03/2020  ? 10:48 AM  ?Diabetic Labs  ?HbA1c 4.8 - 5.6 %   7.2   7.3     ?Chol 100 - 199 mg/dL 101     158     ?HDL >39 mg/dL 47     52     ?Calc LDL 0 - 99 mg/dL 42     92     ?Triglycerides 0 - 149 mg/dL 51     70     ?Creatinine 0.57 - 1.00 mg/dL  1.40   1.25   1.19   1.05    ? ? ?  08/07/2021  ?  9:49 AM 05/10/2021  ? 10:54 AM 05/08/2021  ?  9:50 AM 05/01/2021  ?  9:34 AM 02/23/2021  ?  9:29 AM 02/07/2021  ?  9:45 AM 10/10/2020  ? 10:03 AM  ?BP/Weight  ?Systolic BP 160 737 106 269 120 158 98  ?Diastolic BP 76 75 78 74 68 78 67  ?Wt. (Lbs) 224 236.99  232.08 237 238 234.1  ?BMI 36.15 kg/m2 38.25 kg/m2  37.46 kg/m2 38.25 kg/m2 38.41 kg/m2 36.67 kg/m2  ? ? ?  02/07/2021  ?  9:40 AM 12/16/2019  ?  8:00 AM  ?Foot/eye exam completion dates  ?Foot Form Completion Done Done  ? ? ? ? ?Updated lab needed  ? ?Vitamin D deficiency ?Controlled, no change in medication ? ? ?B12 deficiency ?Updated lab needed  ? ?IDA (iron deficiency anemia) ?Updated lab needed . ? ? ?

## 2021-08-07 NOTE — Assessment & Plan Note (Signed)
?  Patient re-educated about  the importance of commitment to a  minimum of 150 minutes of exercise per week as able. ? ?The importance of healthy food choices with portion control discussed, as well as eating regularly and within a 12 hour window most days. ?The need to choose "clean , green" food 50 to 75% of the time is discussed, as well as to make water the primary drink and set a goal of 64 ounces water daily. ? ?  ? ?  08/07/2021  ?  9:49 AM 05/10/2021  ? 10:54 AM 05/01/2021  ?  9:34 AM  ?Weight /BMI  ?Weight 224 lb 236 lb 15.9 oz 232 lb 1.3 oz  ?Height '5\' 6"'$  (1.676 m) '5\' 6"'$  (1.676 m) '5\' 6"'$  (1.676 m)  ?BMI 36.15 kg/m2 38.25 kg/m2 37.46 kg/m2  ? ? ?Marked improvement ?

## 2021-08-07 NOTE — Assessment & Plan Note (Signed)
Controlled, no change in medication ?DASH diet and commitment to daily physical activity for a minimum of 30 minutes discussed and encouraged, as a part of hypertension management. ?The importance of attaining a healthy weight is also discussed. ? ? ?  08/07/2021  ?  9:49 AM 05/10/2021  ? 10:54 AM 05/08/2021  ?  9:50 AM 05/01/2021  ?  9:34 AM 02/23/2021  ?  9:29 AM 02/07/2021  ?  9:45 AM 10/10/2020  ? 10:03 AM  ?BP/Weight  ?Systolic BP 594 707 615 183 120 158 98  ?Diastolic BP 76 75 78 74 68 78 67  ?Wt. (Lbs) 224 236.99  232.08 237 238 234.1  ?BMI 36.15 kg/m2 38.25 kg/m2  37.46 kg/m2 38.25 kg/m2 38.41 kg/m2 36.67 kg/m2  ? ? ? ? ?

## 2021-08-07 NOTE — Patient Instructions (Addendum)
Annual exam due 10/13 or after, flu vaccine atr visit ? ? ?Please schedule mammogram for 11/01 or after at checkout ? ?Labs today, CBC, B12, iron, cmp and eGFr, HBA1C and TSH ? ?Microalb 4/20 or after ? ?I do recommend the shingrix vaccines , please get them at your pharmacy ? ?It is important that you exercise regularly at least 30 minutes 5 times a week. If you develop chest pain, have severe difficulty breathing, or feel very tired, stop exercising immediately and seek medical attention  ? ?Think about what you will eat, plan ahead. ?Choose " clean, green, fresh or frozen" over canned, processed or packaged foods which are more sugary, salty and fatty. ?70 to 75% of food eaten should be vegetables and fruit. ?Three meals at set times with snacks allowed between meals, but they must be fruit or vegetables. ?Aim to eat over a 12 hour period , example 7 am to 7 pm, and STOP after  your last meal of the day. ?Drink water,generally about 64 ounces per day, no other drink is as healthy. Fruit juice is best enjoyed in a healthy way, by EATING the fruit. ? ? ?Thanks for choosing [facility], we consider it a privelige to serve you. ? ?

## 2021-08-07 NOTE — Assessment & Plan Note (Signed)
Helen Mahoney is reminded of the importance of commitment to daily physical activity for 30 minutes or more, as able and the need to limit carbohydrate intake to 30 to 60 grams per meal to help with blood sugar control.  ? ?The need to take medication as prescribed, test blood sugar as directed, and to call between visits if there is a concern that blood sugar is uncontrolled is also discussed.  ? ?Helen Mahoney is reminded of the importance of daily foot exam, annual eye examination, and good blood sugar, blood pressure and cholesterol control. ? ? ?  Latest Ref Rng & Units 08/01/2021  ?  8:49 AM 06/05/2021  ?  8:07 AM 05/04/2021  ?  8:07 AM 02/01/2021  ?  8:03 AM 10/03/2020  ? 10:48 AM  ?Diabetic Labs  ?HbA1c 4.8 - 5.6 %   7.2   7.3     ?Chol 100 - 199 mg/dL 101     158     ?HDL >39 mg/dL 47     52     ?Calc LDL 0 - 99 mg/dL 42     92     ?Triglycerides 0 - 149 mg/dL 51     70     ?Creatinine 0.57 - 1.00 mg/dL  1.40   1.25   1.19   1.05    ? ? ?  08/07/2021  ?  9:49 AM 05/10/2021  ? 10:54 AM 05/08/2021  ?  9:50 AM 05/01/2021  ?  9:34 AM 02/23/2021  ?  9:29 AM 02/07/2021  ?  9:45 AM 10/10/2020  ? 10:03 AM  ?BP/Weight  ?Systolic BP 650 354 656 812 120 158 98  ?Diastolic BP 76 75 78 74 68 78 67  ?Wt. (Lbs) 224 236.99  232.08 237 238 234.1  ?BMI 36.15 kg/m2 38.25 kg/m2  37.46 kg/m2 38.25 kg/m2 38.41 kg/m2 36.67 kg/m2  ? ? ?  02/07/2021  ?  9:40 AM 12/16/2019  ?  8:00 AM  ?Foot/eye exam completion dates  ?Foot Form Completion Done Done  ? ? ? ? ?Updated lab needed  ?

## 2021-08-07 NOTE — Assessment & Plan Note (Signed)
Hyperlipidemia:Low fat diet discussed and encouraged. ? ? ?Lipid Panel  ?Lab Results  ?Component Value Date  ? CHOL 101 08/01/2021  ? HDL 47 08/01/2021  ? Claude 42 08/01/2021  ? TRIG 51 08/01/2021  ? CHOLHDL 2.1 08/01/2021  ? ? ? ?Controlled, no med change ?

## 2021-08-08 LAB — CMP14+EGFR
ALT: 25 IU/L (ref 0–32)
AST: 25 IU/L (ref 0–40)
Albumin/Globulin Ratio: 1.3 (ref 1.2–2.2)
Albumin: 4 g/dL (ref 3.7–4.7)
Alkaline Phosphatase: 142 IU/L — ABNORMAL HIGH (ref 44–121)
BUN/Creatinine Ratio: 13 (ref 12–28)
BUN: 18 mg/dL (ref 8–27)
Bilirubin Total: 0.4 mg/dL (ref 0.0–1.2)
CO2: 25 mmol/L (ref 20–29)
Calcium: 9.3 mg/dL (ref 8.7–10.3)
Chloride: 99 mmol/L (ref 96–106)
Creatinine, Ser: 1.43 mg/dL — ABNORMAL HIGH (ref 0.57–1.00)
Globulin, Total: 3.1 g/dL (ref 1.5–4.5)
Glucose: 181 mg/dL — ABNORMAL HIGH (ref 70–99)
Potassium: 4.7 mmol/L (ref 3.5–5.2)
Sodium: 139 mmol/L (ref 134–144)
Total Protein: 7.1 g/dL (ref 6.0–8.5)
eGFR: 39 mL/min/{1.73_m2} — ABNORMAL LOW (ref >59)

## 2021-08-08 LAB — CBC
Hematocrit: 36.4 % (ref 34.0–46.6)
Hemoglobin: 11 g/dL — ABNORMAL LOW (ref 11.1–15.9)
MCH: 21.7 pg — ABNORMAL LOW (ref 26.6–33.0)
MCHC: 30.2 g/dL — ABNORMAL LOW (ref 31.5–35.7)
MCV: 72 fL — ABNORMAL LOW (ref 79–97)
Platelets: 318 10*3/uL (ref 150–450)
RBC: 5.08 x10E6/uL (ref 3.77–5.28)
RDW: 15.9 % — ABNORMAL HIGH (ref 11.7–15.4)
WBC: 9.4 10*3/uL (ref 3.4–10.8)

## 2021-08-08 LAB — IRON: Iron: 51 ug/dL (ref 27–139)

## 2021-08-08 LAB — TSH: TSH: 2.63 u[IU]/mL (ref 0.450–4.500)

## 2021-08-08 LAB — VITAMIN B12: Vitamin B-12: 1672 pg/mL — ABNORMAL HIGH (ref 232–1245)

## 2021-08-08 LAB — HEMOGLOBIN A1C
Est. average glucose Bld gHb Est-mCnc: 240 mg/dL
Hgb A1c MFr Bld: 10 % — ABNORMAL HIGH (ref 4.8–5.6)

## 2021-08-09 ENCOUNTER — Other Ambulatory Visit: Payer: Self-pay | Admitting: Family Medicine

## 2021-08-21 ENCOUNTER — Other Ambulatory Visit: Payer: Self-pay | Admitting: Family Medicine

## 2021-09-19 ENCOUNTER — Other Ambulatory Visit: Payer: Self-pay | Admitting: Family Medicine

## 2021-10-20 ENCOUNTER — Other Ambulatory Visit: Payer: Self-pay | Admitting: Family Medicine

## 2021-11-02 ENCOUNTER — Inpatient Hospital Stay (HOSPITAL_COMMUNITY): Payer: Medicare HMO | Attending: Hematology

## 2021-11-02 DIAGNOSIS — E559 Vitamin D deficiency, unspecified: Secondary | ICD-10-CM | POA: Diagnosis not present

## 2021-11-02 DIAGNOSIS — E538 Deficiency of other specified B group vitamins: Secondary | ICD-10-CM | POA: Insufficient documentation

## 2021-11-02 DIAGNOSIS — Z79899 Other long term (current) drug therapy: Secondary | ICD-10-CM | POA: Diagnosis not present

## 2021-11-02 DIAGNOSIS — D509 Iron deficiency anemia, unspecified: Secondary | ICD-10-CM | POA: Diagnosis not present

## 2021-11-02 DIAGNOSIS — D563 Thalassemia minor: Secondary | ICD-10-CM

## 2021-11-02 DIAGNOSIS — D508 Other iron deficiency anemias: Secondary | ICD-10-CM

## 2021-11-02 LAB — CBC WITH DIFFERENTIAL/PLATELET
Abs Immature Granulocytes: 0.04 10*3/uL (ref 0.00–0.07)
Basophils Absolute: 0 10*3/uL (ref 0.0–0.1)
Basophils Relative: 1 %
Eosinophils Absolute: 0.4 10*3/uL (ref 0.0–0.5)
Eosinophils Relative: 5 %
HCT: 35.4 % — ABNORMAL LOW (ref 36.0–46.0)
Hemoglobin: 10.7 g/dL — ABNORMAL LOW (ref 12.0–15.0)
Immature Granulocytes: 1 %
Lymphocytes Relative: 24 %
Lymphs Abs: 1.7 10*3/uL (ref 0.7–4.0)
MCH: 21.6 pg — ABNORMAL LOW (ref 26.0–34.0)
MCHC: 30.2 g/dL (ref 30.0–36.0)
MCV: 71.4 fL — ABNORMAL LOW (ref 80.0–100.0)
Monocytes Absolute: 0.4 10*3/uL (ref 0.1–1.0)
Monocytes Relative: 6 %
Neutro Abs: 4.7 10*3/uL (ref 1.7–7.7)
Neutrophils Relative %: 63 %
Platelets: 281 10*3/uL (ref 150–400)
RBC: 4.96 MIL/uL (ref 3.87–5.11)
RDW: 16.1 % — ABNORMAL HIGH (ref 11.5–15.5)
WBC: 7.3 10*3/uL (ref 4.0–10.5)
nRBC: 0 % (ref 0.0–0.2)

## 2021-11-02 LAB — IRON AND TIBC
Iron: 59 ug/dL (ref 28–170)
Saturation Ratios: 19 % (ref 10.4–31.8)
TIBC: 318 ug/dL (ref 250–450)
UIBC: 259 ug/dL

## 2021-11-02 LAB — FERRITIN: Ferritin: 119 ng/mL (ref 11–307)

## 2021-11-02 LAB — VITAMIN B12: Vitamin B-12: 560 pg/mL (ref 180–914)

## 2021-11-02 LAB — FOLATE: Folate: >22 ng/mL (ref >5.9)

## 2021-11-02 LAB — VITAMIN D 25 HYDROXY (VIT D DEFICIENCY, FRACTURES): Vit D, 25-Hydroxy: 34.35 ng/mL (ref 30–100)

## 2021-11-05 LAB — METHYLMALONIC ACID, SERUM: Methylmalonic Acid, Quantitative: 159 nmol/L (ref 0–378)

## 2021-11-09 ENCOUNTER — Inpatient Hospital Stay (HOSPITAL_BASED_OUTPATIENT_CLINIC_OR_DEPARTMENT_OTHER): Payer: Medicare HMO | Admitting: Physician Assistant

## 2021-11-09 DIAGNOSIS — D563 Thalassemia minor: Secondary | ICD-10-CM | POA: Diagnosis not present

## 2021-11-09 DIAGNOSIS — D508 Other iron deficiency anemias: Secondary | ICD-10-CM | POA: Diagnosis not present

## 2021-11-09 DIAGNOSIS — E538 Deficiency of other specified B group vitamins: Secondary | ICD-10-CM

## 2021-11-09 DIAGNOSIS — E559 Vitamin D deficiency, unspecified: Secondary | ICD-10-CM

## 2021-11-09 NOTE — Progress Notes (Signed)
Virtual Visit via Telephone Note [facility]  I connected with Helen Mahoney  on 11/09/2021 at 2:00 PM by telephone and verified that I am speaking with the correct person using two identifiers.  Location: Patient: Home Provider: [facility]   I discussed the limitations, risks, security and privacy concerns of performing an evaluation and management service by telephone and the availability of in person appointments. I also discussed with the patient that there may be a patient responsible charge related to this service. The patient expressed understanding and agreed to proceed.  REASON FOR VISIT:  Follow-up for alpha thalassemia minor, iron deficiency anemia, and B12 deficiency   PRIOR THERAPY: IV iron (last given April 2017)   CURRENT THERAPY: Oral iron supplementation  INTERVAL HISTORY: Helen Mahoney is contacted today for follow-up of her alpha thalassemia minor, iron deficiency, B12 and folate deficiency.  She was last seen by Tarri Abernethy PA-C on 05/10/2021. At today's visit, she reports feeling like her usual self and denies any recent changes in her baseline health status.  She denies any recent bleeding such as epistaxis, hematemesis, hematochezia, or melena.  She reports that her energy is at baseline with chronic fatigue.  She denies any pica, restless legs, headaches, lightheadedness, or syncope.  No chest pain or dyspnea on exertion.  She continues to take her oral iron, B12, folate, and vitamin D supplements.  She has little to no energy and 100% appetite. She endorses that she is maintaining a stable weight.    OBSERVATIONS/OBJECTIVE: Review of Systems  Constitutional:  Positive for malaise/fatigue. Negative for chills, diaphoresis, fever and weight loss.  Respiratory:  Negative for cough and shortness of breath.   Cardiovascular:  Negative for chest pain and palpitations.  Gastrointestinal:  Negative for abdominal pain,  blood in stool, melena, nausea and vomiting.  Neurological:  Negative for dizziness and headaches.     PHYSICAL EXAM (per limitations of virtual telephone visit): The patient is alert and oriented x 3, exhibiting adequate mentation, good mood, and ability to speak in full sentences and execute sound judgement.   ASSESSMENT & PLAN: 1.  Microcytic anemia with alpha thalassemia minor - Longstanding microcytic anemia dating back to before 2008, alpha thalassemia genotype study done on 08/17/2015 - Ongoing mild (Hgb > 10.0) microcytic anemia attributable to alpha thalassemia trait - She has previously required IV Feraheme (last given April 2017), currently on daily oral iron supplement  - Most recent colonoscopy (08/07/2020): Nonbleeding internal hemorrhoids, diverticulosis, polyp x1 - EGD (05/15/2015): Gastric polyps, GE junction stricture - Capsule study (06/02/2015): Occasional erosion in the duodenum.  No masses, ulcers, or AVMs seen.  Occasional lymphangiectasia. - SPEP checked in 2017 was normal.  Hemoccult stool checked in 2012 was negative. - Her baseline hemoglobin ranges from 9.5-11.0 - Most recent labs (11/02/2021): Hgb at baseline 10.7/MCV 71.4, ferritin 119, iron saturation 19% - No bright red blood per rectum or melena  - Chronic fatigue is at baseline - PLAN: No indication for IV iron at this time.  Continue oral iron supplementation.  Repeat labs and RTC in 6 months.   2.  Vitamin B12 deficiency - Patient had been receiving monthly B12 injections since February 2017 - Labs in November 2021 showed vitamin B12 level > 7500, therefore injections were held - She was placed on oral B12 tablets in June 2022   - Most recent B12 level (11/02/2021) is 560, normal methylmalonic acid - PLAN: Continue oral B12  supplementation.  Check B12/MMA at follow-up visit in 6 months.   3.  Folate deficiency - Labs on 10/03/2020 showed folate decreased at 5.4 - She was started on folic acid supplementation in  June 2022 - Most recent folic acid is improved at >22. - PLAN: Continue daily folic acid supplementation.  We will recheck levels at follow-up visit in 6 months.   4.  Vitamin D deficiency - She takes vitamin D 2,000 units daily - Most recent vitamin D (11/02/2021) is normal at 34.35 - PLAN: Continue vitamin D 2000 units daily.  We will recheck levels at follow-up visit in 6 months.    FOLLOW UP INSTRUCTIONS: Labs in 6 months (CBC, ferritin, iron/TIBC, B12, methylmalonic, folate, vitamin D) Phone visit after labs    I discussed the assessment and treatment plan with the patient. The patient was provided an opportunity to ask questions and all were answered. The patient agreed with the plan and demonstrated an understanding of the instructions.   The patient was advised to call back or seek an in-person evaluation if the symptoms worsen or if the condition fails to improve as anticipated.  I provided 13 minutes of non-face-to-face time during this encounter.   Harriett Rush, PA-C 11/09/2021 2:31 PM

## 2021-11-18 ENCOUNTER — Other Ambulatory Visit: Payer: Self-pay | Admitting: Family Medicine

## 2021-12-12 ENCOUNTER — Other Ambulatory Visit: Payer: Self-pay | Admitting: Family Medicine

## 2021-12-23 ENCOUNTER — Other Ambulatory Visit: Payer: Self-pay | Admitting: Family Medicine

## 2022-02-12 ENCOUNTER — Ambulatory Visit (INDEPENDENT_AMBULATORY_CARE_PROVIDER_SITE_OTHER): Payer: Medicare HMO | Admitting: Family Medicine

## 2022-02-12 ENCOUNTER — Encounter: Payer: Self-pay | Admitting: Family Medicine

## 2022-02-12 VITALS — BP 130/72 | HR 94 | Ht 66.0 in | Wt 237.0 lb

## 2022-02-12 DIAGNOSIS — M25561 Pain in right knee: Secondary | ICD-10-CM

## 2022-02-12 DIAGNOSIS — E785 Hyperlipidemia, unspecified: Secondary | ICD-10-CM | POA: Diagnosis not present

## 2022-02-12 DIAGNOSIS — G8929 Other chronic pain: Secondary | ICD-10-CM

## 2022-02-12 DIAGNOSIS — Z0001 Encounter for general adult medical examination with abnormal findings: Secondary | ICD-10-CM

## 2022-02-12 DIAGNOSIS — I10 Essential (primary) hypertension: Secondary | ICD-10-CM | POA: Diagnosis not present

## 2022-02-12 DIAGNOSIS — E1159 Type 2 diabetes mellitus with other circulatory complications: Secondary | ICD-10-CM

## 2022-02-12 DIAGNOSIS — Z23 Encounter for immunization: Secondary | ICD-10-CM

## 2022-02-12 NOTE — Assessment & Plan Note (Signed)
Ortho referral, worsening x 4 months with instability

## 2022-02-12 NOTE — Patient Instructions (Addendum)
F/U in 13 weeks call if you need me sooner  Labs today, microalb, hBA1C, lipid, cmp and eGFr, fasting, we will call tomorrow re any needed med adjustments esp for your diabetes  Nurse please obrain the 1 other shingrix vaccines feom [facility] scale st and document  Foot exam today  Please cut back on sweets/ sugar , you have gained 13 pounds in the past 12 months, and blood sugar needs better control    Flu vaccine today It is important that you exercise regularly at least 30 minutes 5 times a week. If you develop chest pain, have severe difficulty breathing, or feel very tired, stop exercising immediately and seek medical attention   Think about what you will eat, plan ahead. Choose " clean, green, fresh or frozen" over canned, processed or packaged foods which are more sugary, salty and fatty. 70 to 75% of food eaten should be vegetables and fruit. Three meals at set times with snacks allowed between meals, but they must be fruit or vegetables. Aim to eat over a 12 hour period , example 7 am to 7 pm, and STOP after  your last meal of the day. Drink water,generally about 64 ounces per day, no other drink is as healthy. Fruit juice is best enjoyed in a healthy way, by EATING the fruit.   Thanks for choosing [facility], we consider it a privelige to serve you.

## 2022-02-12 NOTE — Progress Notes (Signed)
     Helen Mahoney     MRN: [id number]      DOB: [birth year]  HPI: Patient is in for annual physical exam. C/o right knee pain and swelling, has benefited in the past from intra articular injection Immunization is updated   PE: BP 130/72   Pulse 94   Ht '5\' 6"'$  (1.676 m)   Wt 237 lb (107.5 kg)   SpO2 95%   BMI 38.25 kg/m   Pleasant  female, alert and oriented x 3, in no cardio-pulmonary distress. Afebrile. HEENT No facial trauma or asymetry. Sinuses non tender.  Extra occullar muscles intact.. External ears normal, . Neck: supple, no adenopathy,JVD or thyromegaly.No bruits.  Chest: Clear to ascultation bilaterally.No crackles or wheezes. Non tender to palpation    Cardiovascular system; Heart sounds normal,  S1 and  S2 ,no S3.  No murmur, or thrill. Apical beat not displaced Peripheral pulses normal.  Abdomen: Soft, non tender, no organomegaly or masses. No bruits. Bowel sounds normal. No guarding, tenderness or rebound.      Musculoskeletal exam: Full ROM of spine, hips , shoulders and decreased in right  knee.  deformity ,swelling and  crepitus noted.in right knee No muscle wasting or atrophy.   Neurologic: Cranial nerves 2 to 12 intact. Power, tone ,sensation and reflexes normal throughout. No disturbance in gait. No tremor.  Skin: Intact, no ulceration, erythema , scaling or rash noted. Pigmentation normal throughout  Psych; Normal mood and affect. Judgement and concentration normal   Assessment & Plan:  Annual visit for general adult medical examination with abnormal findings Annual exam as documented. Counseling done  re healthy lifestyle involving commitment to 150 minutes exercise per week, heart healthy diet, and attaining healthy weight.The importance of adequate sleep also discussed. Regular seat belt use and home safety, is also discussed. Changes in health habits are decided on by the patient with goals and time frames  set for  achieving them. Immunization and cancer screening needs are specifically addressed at this visit.'   Chronic pain of right knee Ortho referral, worsening x 4 months with instability

## 2022-02-12 NOTE — Assessment & Plan Note (Signed)

## 2022-02-14 LAB — HEMOGLOBIN A1C
Est. average glucose Bld gHb Est-mCnc: 246 mg/dL
Hgb A1c MFr Bld: 10.2 % — ABNORMAL HIGH (ref 4.8–5.6)

## 2022-02-14 LAB — CMP14+EGFR
ALT: 27 IU/L (ref 0–32)
AST: 26 IU/L (ref 0–40)
Albumin/Globulin Ratio: 1.3 (ref 1.2–2.2)
Albumin: 4 g/dL (ref 3.8–4.8)
Alkaline Phosphatase: 147 IU/L — ABNORMAL HIGH (ref 44–121)
BUN/Creatinine Ratio: 17 (ref 12–28)
BUN: 27 mg/dL (ref 8–27)
Bilirubin Total: 0.4 mg/dL (ref 0.0–1.2)
CO2: 23 mmol/L (ref 20–29)
Calcium: 9.1 mg/dL (ref 8.7–10.3)
Chloride: 98 mmol/L (ref 96–106)
Creatinine, Ser: 1.55 mg/dL — ABNORMAL HIGH (ref 0.57–1.00)
Globulin, Total: 3.1 g/dL (ref 1.5–4.5)
Glucose: 122 mg/dL — ABNORMAL HIGH (ref 70–99)
Potassium: 4.1 mmol/L (ref 3.5–5.2)
Sodium: 138 mmol/L (ref 134–144)
Total Protein: 7.1 g/dL (ref 6.0–8.5)
eGFR: 36 mL/min/{1.73_m2} — ABNORMAL LOW (ref >59)

## 2022-02-14 LAB — MICROALBUMIN / CREATININE URINE RATIO
Creatinine, Urine: 229.7 mg/dL
Microalb/Creat Ratio: 6 mg/g creat (ref 0–29)
Microalbumin, Urine: 12.9 ug/mL

## 2022-02-14 LAB — LIPID PANEL
Chol/HDL Ratio: 2.3 ratio (ref 0.0–4.4)
Cholesterol, Total: 120 mg/dL (ref 100–199)
HDL: 53 mg/dL (ref >39)
LDL Chol Calc (NIH): 50 mg/dL (ref 0–99)
Triglycerides: 87 mg/dL (ref 0–149)
VLDL Cholesterol Cal: 17 mg/dL (ref 5–40)

## 2022-02-18 ENCOUNTER — Ambulatory Visit (INDEPENDENT_AMBULATORY_CARE_PROVIDER_SITE_OTHER): Payer: Medicare HMO | Admitting: Internal Medicine

## 2022-02-18 ENCOUNTER — Other Ambulatory Visit: Payer: Self-pay

## 2022-02-18 ENCOUNTER — Encounter: Payer: Self-pay | Admitting: Internal Medicine

## 2022-02-18 DIAGNOSIS — Z0000 Encounter for general adult medical examination without abnormal findings: Secondary | ICD-10-CM

## 2022-02-18 DIAGNOSIS — E1159 Type 2 diabetes mellitus with other circulatory complications: Secondary | ICD-10-CM

## 2022-02-18 MED ORDER — GLUCOSE BLOOD VI STRP: ORAL_STRIP | 12 refills | Status: AC

## 2022-02-18 MED ORDER — BLOOD GLUCOSE METER KIT: PACK | 0 refills | Status: AC

## 2022-02-18 MED ORDER — LANCETS 30G MISC: 5 refills | Status: AC

## 2022-02-18 NOTE — Progress Notes (Signed)
Subjective:  This is a telephone encounter between Helen Mahoney and Helen Mahoney on 02/18/2022 for AWV. The visit was conducted with the patient located at home and Helen Mahoney at [facility]. The patient's identity was confirmed using their DOB and current address. The patient has consented to being evaluated through a telephone encounter and understands the associated risks (an examination cannot be done and the patient may need to come in for an appointment) / benefits (allows the patient to remain at home, decreasing exposure to coronavirus).      Helen Mahoney is a 71 y.o. female who presents for Medicare Annual (Subsequent) preventive examination.  Review of Systems    Review of Systems  All other systems reviewed and are negative.    Objective:    There were no vitals filed for this visit. There is no height or weight on file to calculate BMI.     11/09/2021   10:00 AM 05/10/2021   11:03 AM 12/28/2020    2:30 PM 10/10/2020   10:10 AM 08/07/2020    6:39 AM 03/30/2020    9:27 AM 03/22/2020    9:24 AM  Advanced Directives  Does Patient Have a Medical Advance Directive? No No No No No No No  Would patient like information on creating a medical advance directive? No - Patient declined No - Patient declined Yes (ED - Information included in AVS) No - Patient declined No - Patient declined No - Patient declined No - Patient declined    Current Medications (verified) Outpatient Encounter Medications as of 02/18/2022  Medication Sig   Alcohol Swabs (DROPSAFE ALCOHOL PREP) 70 % PADS USE AS DIRECTED THREE TIMES DAILY   aspirin 81 MG tablet Take 81 mg by mouth daily.   Blood Glucose Calibration (TRUE METRIX LEVEL 1) Low SOLN USE AS DIRECTED AS NEEDED   Cholecalciferol (VITAMIN D3) 2000 units TABS Take 2,000 Units by mouth daily.   DROPLET PEN NEEDLES 31G X 8 MM MISC USE EVERY DAY AS DIRECTED.   Dulaglutide (TRULICITY) 1.5 TK/2.4OX SOPN Inject 1.5 mg into the skin once a week.    ezetimibe (ZETIA) 10 MG tablet Take 1 tablet (10 mg total) by mouth daily.   ferrous sulfate 325 (65 FE) MG tablet Take 325 mg by mouth daily with breakfast.   folic acid (FOLVITE) 735 MCG tablet Take 800 mcg by mouth daily.   glipiZIDE (GLUCOTROL) 10 MG tablet TAKE 1 TABLET TWICE DAILY BEFORE MEALS   LANTUS SOLOSTAR 100 UNIT/ML Solostar Pen INJECT 50 UNITS SUBCUTANEOUSLY ONCE DAILY AT  10:00  PM   lisinopril-hydrochlorothiazide (ZESTORETIC) 20-12.5 MG tablet TAKE 2 TABLETS ONE TIME DAILY FOR BLOOD PRESSURE (DOSE INCREASE)   rosuvastatin (CRESTOR) 40 MG tablet Take 1 tablet (40 mg total) by mouth daily.   TRUE METRIX BLOOD GLUCOSE TEST test strip TEST BLOOD SUGAR THREE TIMES DAILY   TRUEplus Lancets 33G MISC USE TO TEST BLOOD SUGAR THREE TIMES DAILY   No facility-administered encounter medications on file as of 02/18/2022.    Allergies (verified) Tetanus toxoids and Penicillins   History: Past Medical History:  Diagnosis Date   Agatston coronary artery calcium score less than 100    51 on coronary CT 03/2021   Alpha-0- thalassemia trait/carrier 05/18/2015   2013: TCS/EGD 2017: TCS/EGD HYPERPLASTIC GASTRIC POLYPS, LYMPHOCYTIC GASTRITIS    B12 deficiency 06/16/2015   Colon polyps    Diabetes mellitus, type 2 ([place])    Hyperlipidemia    Hypertension    Microcytic  anemia 05/18/2015   2013: TCS/EGD 2017: TCS/EGD HYPERPLASTIC GASTRIC POLYPS, LYMPHOCYTIC GASTRITIS    Obesity    Past Surgical History:  Procedure Laterality Date   bilateral tubal ligation  1979   CHOLECYSTECTOMY  2001   ACUTE CHOLECYSTITIS/GALLSTONES   COLONOSCOPY  05/14/2011   Dr. Oneida Alar: sessile polyp in sigmoid colon, internal hemorrhoids, hyerplastic polyps   COLONOSCOPY N/A 05/15/2015   Procedure: COLONOSCOPY;  Surgeon: Danie Binder, MD;  [facility];  Service: Endoscopy;  Laterality: N/A;  0830   COLONOSCOPY W/ POLYPECTOMY  NOV 2008 [facility] ANEMIA   POLYP NO RETRIEVED   COLONOSCOPY W/ POLYPECTOMY   2006 DR. SMTH   POLYP?   COLONOSCOPY WITH PROPOFOL N/A 08/07/2020   Procedure: COLONOSCOPY WITH PROPOFOL;  Surgeon: Eloise Harman, DO;  [facility];  Service: Endoscopy;  Laterality: N/A;  ASA II / AM procedure   ESOPHAGOGASTRODUODENOSCOPY  05/14/2011   Dr. Oneida Alar: sessile polyps in the cardia, mild gastritis. Chronic duodenitis consistent with peptic duodenitis, chronic active H.pylori gastritis.    ESOPHAGOGASTRODUODENOSCOPY N/A 05/15/2015   Procedure: ESOPHAGOGASTRODUODENOSCOPY (EGD);  Surgeon: Danie Binder, MD;  [facility];  Service: Endoscopy;  Laterality: N/A;   GIVENS CAPSULE STUDY N/A 06/02/2015   Procedure: GIVENS CAPSULE STUDY;  Surgeon: Danie Binder, MD;  [facility];  Service: Endoscopy;  Laterality: N/A;  0700   POLYPECTOMY  08/07/2020   Procedure: POLYPECTOMY;  Surgeon: Eloise Harman, DO;  [facility];  Service: Endoscopy;;   TUBAL LIGATION     UPPER GASTROINTESTINAL ENDOSCOPY  NOV 2008 [facility] ANEMIA   NL EXAM, urease neg   Family History  Problem Relation Age of Onset   Heart disease Mother    Diabetes Mother    Hypertension Mother    Stroke Mother    Breast cancer Mother    Diabetes Father    Heart attack Father    Kidney disease Father    Hypertension Sister    Diabetes Sister    Diabetes Sister    Mental illness Sister    Hypertension Sister    Hepatitis C Sister    Mental illness Sister    Diabetes Sister    Hypertension Sister    Mental illness Sister    Colon cancer Sister 5   Diabetes Brother    Social History   Socioeconomic History   Marital status: Married    Spouse name: Not on file   Number of children: Not on file   Years of education: Not on file   Highest education level: Not on file  Occupational History   Not on file  Tobacco Use   Smoking status: Never   Smokeless tobacco: Never  Vaping Use   Vaping Use: Never used  Substance and Sexual Activity   Alcohol use: No   Drug use: No    Sexual activity: Not Currently  Other Topics Concern   Not on file  Social History Narrative   Not on file   Social Determinants of Health   Financial Resource Strain: Low Risk  (12/28/2020)   Overall Financial Resource Strain (CARDIA)    Difficulty of Paying Living Expenses: Not hard at all  Food Insecurity: No Food Insecurity (12/28/2020)   Hunger Vital Sign    Worried About Running Out of Food in the Last Year: Never true    [place] in the Last Year: Never true  Transportation Needs: No Transportation Needs (12/28/2020)  PRAPARE - Hydrologist (Medical): No    Lack of Transportation (Non-Medical): No  Physical Activity: Insufficiently Active (12/28/2019)   Exercise Vital Sign    Days of Exercise per Week: 3 days    Minutes of Exercise per Session: 30 min  Stress: No Stress Concern Present (12/28/2020)   [place]    Feeling of Stress : Not at all  Social Connections: Moderately Isolated (12/28/2020)   Social Connection and Isolation Panel [NHANES]    Frequency of Communication with Friends and Family: More than three times a week    Frequency of Social Gatherings with Friends and Family: More than three times a week    Attends Religious Services: More than 4 times per year    Active Member of [employer] or Organizations: No    Attends Archivist Meetings: Never    Marital Status: Widowed    Tobacco Counseling Counseling given: Not Answered   Clinical Intake:  Pre-visit preparation completed: Yes  Pain : No/denies pain     Nutritional Status: BMI > 30  Obese Diabetes: Yes CBG done?: No Did pt. bring in CBG monitor from home?: No  How often do you need to have someone help you when you read instructions, pamphlets, or other written materials from your doctor or pharmacy?: 1 - Never What is the last grade level you completed in school?: 12th  grade  Diabetic?Yes    Activities of Daily Living    02/18/2022    1:51 PM  In your present state of health, do you have any difficulty performing the following activities:  Hearing? 0  Vision? 0  Difficulty concentrating or making decisions? 0  Walking or climbing stairs? 0  Dressing or bathing? 0  Doing errands, shopping? 0    Patient Care Team: Fayrene Helper, MD as PCP - General Fields, Marga Melnick, MD (Inactive) as Consulting Physician (Gastroenterology) Whitney Muse, Kelby Fam, MD (Inactive) as Consulting Physician (Hematology and Oncology) Eloise Harman, DO as Consulting Physician (Internal Medicine) Beryle Lathe, [facility] (Inactive) (Pharmacist)  Indicate any recent Medical Services you may have received from other than [facility] providers in the past year (date may be approximate).     Assessment:   This is a routine wellness examination for [place].  Hearing/Vision screen No results found.  Dietary issues and exercise activities discussed:     Goals Addressed   None    Depression Screen    02/18/2022    1:50 PM 02/12/2022   10:15 AM 08/07/2021    9:50 AM 06/27/2021   10:15 AM 05/01/2021    9:35 AM 02/07/2021    9:46 AM 12/28/2020    2:31 PM  PHQ 2/9 Scores  PHQ - 2 Score 0 0 0 0 0 0 0  PHQ- 9 Score      0     Fall Risk    02/18/2022    1:50 PM 02/12/2022   10:15 AM 08/07/2021    9:50 AM 06/27/2021   10:15 AM 05/01/2021    9:35 AM  Fall Risk   Falls in the past year? 0 0 0 0 0  Number falls in past yr: 0 0 0 0 0  Injury with Fall? 0 0 0 0 0  Risk for fall due to :  No Fall Risks No Fall Risks No Fall Risks No Fall Risks  Follow up  Falls evaluation completed Falls evaluation completed Falls  evaluation completed Falls evaluation completed    FALL RISK PREVENTION PERTAINING TO THE HOME:  Any stairs in or around the home? Yes  If so, are there any without handrails? Yes  Home free of loose throw rugs in walkways, pet beds, electrical cords,  etc? Yes  Adequate lighting in your home to reduce risk of falls? Yes   ASSISTIVE DEVICES UTILIZED TO PREVENT FALLS:  Life alert? No  Use of a cane, walker or w/c? No  Grab bars in the bathroom? No  Shower chair or bench in shower? No  Elevated toilet seat or a handicapped toilet? Yes   Cognitive Function:    12/28/2019    1:16 PM  MMSE - Mini Mental State Exam  Not completed: Unable to complete        02/18/2022    1:52 PM 12/28/2020    2:32 PM 12/28/2019    1:16 PM 12/02/2018    8:39 AM 11/25/2017    9:04 AM  6CIT Screen  What Year? 0 points 0 points 0 points 0 points 0 points  What month? 0 points 0 points 0 points 0 points 0 points  What time? 0 points 0 points 0 points 0 points 0 points  Count back from 20  0 points 0 points 0 points 0 points  Months in reverse  0 points 0 points 0 points 2 points  Repeat phrase  0 points 0 points 0 points   Total Score  0 points 0 points 0 points     Immunizations Immunization History  Administered Date(s) Administered   Fluad Quad(high Dose 65+) 01/11/2019, 01/03/2021, 02/12/2022   Influenza Split 02/14/2011, 03/18/2012   Influenza Whole 02/03/2007, 01/25/2008, 01/20/2009, 01/04/2010   Influenza, High Dose Seasonal PF 07/01/2018   Influenza,inj,Quad PF,6+ Mos 02/18/2013, 03/14/2014, 01/12/2015, 02/22/2016, 01/27/2017, 12/18/2017, 12/16/2019   Moderna Sars-Covid-2 Vaccination 06/11/2019, 07/09/2019, 02/21/2020, 08/22/2020   Pneumococcal Conjugate-13 07/13/2014   Pneumococcal Polysaccharide-23 09/14/2009, 09/11/2015   Zoster Recombinat (Shingrix) 12/24/2021   Zoster, Live 10/16/2010    TDAP status: Allergy  Flu Vaccine status: Up to date  Pneumococcal vaccine status: Up to date  Covid-19 vaccine status: Completed vaccines  Qualifies for Shingles Vaccine? Yes   Zostavax completed No   Shingrix Completed?: Yes Needs to bring vaccine card.   Screening Tests Health Maintenance  Topic Date Due   Zoster Vaccines- Shingrix (2  of 2) 02/18/2022   COVID-19 Vaccine (5 - Moderna risk series) 02/20/2023 (Originally 10/17/2020)   OPHTHALMOLOGY EXAM  02/20/2022   HEMOGLOBIN A1C  08/14/2022   Diabetic kidney evaluation - GFR measurement  02/13/2023   Diabetic kidney evaluation - Urine ACR  02/13/2023   FOOT EXAM  02/13/2023   MAMMOGRAM  02/27/2023   COLONOSCOPY (Pts 45-29yr Insurance coverage will need to be confirmed)  08/07/2025   Pneumonia Vaccine 71 Years old  Completed   INFLUENZA VACCINE  Completed   DEXA SCAN  Completed   Hepatitis C Screening  Completed   HPV VACCINES  Aged Out   TETANUS/TDAP  Discontinued    Health Maintenance  Health Maintenance Due  Topic Date Due   Zoster Vaccines- Shingrix (2 of 2) 02/18/2022    Colorectal cancer screening: Type of screening: Colonoscopy. Completed 08/08/2015. Repeat every 5 years  Mammogram status: Ordered 03/01/2022. Pt provided with contact info and advised to call to schedule appt.   Bone Density status: Completed 02/14/2016. Results reflect: Bone density results: NORMAL.  Lung Cancer Screening: (Low Dose CT Chest recommended if Age 71-80  years, 30 pack-year currently smoking OR have quit w/in 15years.) does not qualify.    Additional Screening:  Hepatitis C Screening: does qualify; Completed 03/31/2015  Vision Screening: Recommended annual ophthalmology exams for early detection of glaucoma and other disorders of the eye. Is the patient up to date with their annual eye exam?  Yes  Who is the provider or what is the name of the office in which the patient attends annual eye exams? [facility] If pt is not established with a provider, would they like to be referred to a provider to establish care? No .   Dental Screening: Recommended annual dental exams for proper oral hygiene  Community Resource Referral / Chronic Care Management: CRR required this visit?  Yes   CCM required this visit?  Yes      Plan:     I have personally reviewed and noted the  following in the patient's chart:   Medical and social history Use of alcohol, tobacco or illicit drugs  Current medications and supplements including opioid prescriptions. Patient is not currently taking opioid prescriptions. Functional ability and status Nutritional status Physical activity Advanced directives List of other physicians Hospitalizations, surgeries, and ER visits in previous 12 months Vitals Screenings to include cognitive, depression, and falls Referrals and appointments  In addition, I have reviewed and discussed with patient certain preventive protocols, quality metrics, and best practice recommendations. A written personalized care plan for preventive services as well as general preventive health recommendations were provided to patient.     Helen Dy, MD   02/18/2022

## 2022-02-18 NOTE — Patient Instructions (Addendum)
  Helen Mahoney , Thank you for taking time to come for your Medicare Wellness Visit. I appreciate your ongoing commitment to your health goals. Please review the following plan we discussed and let me know if I can assist you in the future.   These are the goals we discussed: Continue to cut back on soft drinks and working with Dr.Simpson to get your diabetes under control.   This is a list of the screening recommended for you and due dates:  Health Maintenance  Topic Date Due   Zoster (Shingles) Vaccine (2 of 2) 02/18/2022   COVID-19 Vaccine (5 - Moderna risk series) 02/20/2023*   Eye exam for diabetics  02/20/2022   Hemoglobin A1C  08/14/2022   Yearly kidney function blood test for diabetes  02/13/2023   Yearly kidney health urinalysis for diabetes  02/13/2023   Complete foot exam   02/13/2023   Mammogram  02/27/2023   Colon Cancer Screening  08/07/2025   Pneumonia Vaccine  Completed   Flu Shot  Completed   DEXA scan (bone density measurement)  Completed   Hepatitis C Screening: USPSTF Recommendation to screen - Ages 79-79 yo.  Completed   HPV Vaccine  Aged Out   Tetanus Vaccine  Discontinued  *Topic was postponed. The date shown is not the original due date.

## 2022-02-19 ENCOUNTER — Other Ambulatory Visit: Payer: Self-pay

## 2022-02-19 ENCOUNTER — Telehealth: Payer: Self-pay | Admitting: Family Medicine

## 2022-02-19 MED ORDER — TRUE METRIX LEVEL 1 LOW VI SOLN: 1 refills | Status: AC

## 2022-02-19 NOTE — Telephone Encounter (Signed)
Patient called in regard to glucose meter.  Wrong prescription was sent In   Patient needs TRUE METRIX BLOOD GLUCOSE Meter   Sent to [facility]

## 2022-02-19 NOTE — Telephone Encounter (Signed)
True metrix blood glucose meter sent to [facility]

## 2022-02-21 DIAGNOSIS — E119 Type 2 diabetes mellitus without complications: Secondary | ICD-10-CM | POA: Diagnosis not present

## 2022-02-21 LAB — HM DIABETES EYE EXAM

## 2022-02-25 ENCOUNTER — Other Ambulatory Visit: Payer: Self-pay | Admitting: Family Medicine

## 2022-03-01 ENCOUNTER — Ambulatory Visit (HOSPITAL_COMMUNITY)
Admission: RE | Admit: 2022-03-01 | Discharge: 2022-03-01 | Disposition: A | Discharge status: Home / Self Care | Payer: Medicare HMO | Source: Ambulatory Visit | Attending: Family Medicine | Admitting: Family Medicine

## 2022-03-01 DIAGNOSIS — Z1231 Encounter for screening mammogram for malignant neoplasm of breast: Secondary | ICD-10-CM | POA: Diagnosis not present

## 2022-03-06 ENCOUNTER — Ambulatory Visit (INDEPENDENT_AMBULATORY_CARE_PROVIDER_SITE_OTHER): Payer: Medicare HMO | Admitting: Orthopedic Surgery

## 2022-03-06 ENCOUNTER — Encounter: Payer: Self-pay | Admitting: Orthopedic Surgery

## 2022-03-06 ENCOUNTER — Ambulatory Visit (INDEPENDENT_AMBULATORY_CARE_PROVIDER_SITE_OTHER): Payer: Medicare HMO

## 2022-03-06 VITALS — BP 102/57 | HR 78 | Ht 66.0 in | Wt 239.0 lb

## 2022-03-06 DIAGNOSIS — M25561 Pain in right knee: Secondary | ICD-10-CM | POA: Diagnosis not present

## 2022-03-06 DIAGNOSIS — G8929 Other chronic pain: Secondary | ICD-10-CM

## 2022-03-06 DIAGNOSIS — M1711 Unilateral primary osteoarthritis, right knee: Secondary | ICD-10-CM

## 2022-03-06 MED ORDER — METHYLPREDNISOLONE ACETATE 40 MG/ML IJ SUSP
40.0000 mg | Freq: Once | INTRAMUSCULAR | Status: AC
  Administered 2022-03-06: 40 mg via INTRA_ARTICULAR

## 2022-03-06 NOTE — Patient Instructions (Signed)

## 2022-03-06 NOTE — Addendum Note (Signed)
Addended byCandice Camp on: 03/06/2022 10:08 AM   Modules accepted: Orders

## 2022-03-06 NOTE — Progress Notes (Signed)
Chief Complaint  Patient presents with   Knee Pain    Right off and on for about a year     HPI: The patient was seen in 2017 had an injection and did well until the last year.  She is 71 years old she complains of lateral knee pain but denies any trauma  Past Medical History:  Diagnosis Date   Agatston coronary artery calcium score less than 100    51 on coronary CT 03/2021   Alpha-0- thalassemia trait/carrier 05/18/2015   2013: TCS/EGD 2017: TCS/EGD HYPERPLASTIC GASTRIC POLYPS, LYMPHOCYTIC GASTRITIS    B12 deficiency 06/16/2015   Colon polyps    Diabetes mellitus, type 2 ([place])    Hyperlipidemia    Hypertension    Microcytic anemia 05/18/2015   2013: TCS/EGD 2017: TCS/EGD HYPERPLASTIC GASTRIC POLYPS, LYMPHOCYTIC GASTRITIS    Obesity     BP (!) 102/57   Pulse 78   Ht '5\' 6"'$  (1.676 m)   Wt 239 lb (108.4 kg)   BMI 38.58 kg/m    General appearance: Well-developed well-nourished no gross deformities  Cardiovascular normal pulse and perfusion normal color without edema  Neurologically no sensation loss or deficits or pathologic reflexes  Psychological: Awake alert and oriented x3 mood and affect normal  Skin no lacerations or ulcerations no nodularity no palpable masses, no erythema or nodularity  Musculoskeletal: Tenderness over the right lateral joint line with some crepitance on range of motion there is no instability muscle tone is normal she has good extension power  Imaging internal imaging shows grade 4 arthritis with valgus alignment to the right knee mild subluxation without tilt of the patella  A/P  Osteoarthritis right knee Encounter Diagnoses  Name Primary?   Chronic pain of right knee Yes   Primary osteoarthritis of right knee      Procedure note right knee injection   verbal consent was obtained to inject right knee joint  Timeout was completed to confirm the site of injection  The medications used were depomedrol 40 mg and 1% lidocaine 3  cc Anesthesia was provided by ethyl chloride and the skin was prepped with alcohol.  After cleaning the skin with alcohol a 20-gauge needle was used to inject the right knee joint. There were no complications. A sterile bandage was applied.

## 2022-03-07 ENCOUNTER — Telehealth: Payer: Self-pay | Admitting: Family Medicine

## 2022-03-07 NOTE — Telephone Encounter (Signed)
[facility] patient assistance program application  Copied Noted sleeved

## 2022-03-11 IMAGING — MG DIGITAL SCREENING BILAT W/ TOMO W/ CAD
8 series · 8 of 24 positions shown · non-contrast
Comparison: Previous exam(s).

CLINICAL DATA: Screening.

EXAM:
DIGITAL SCREENING BILATERAL MAMMOGRAM WITH TOMO AND CAD

[L CC synth-2D]
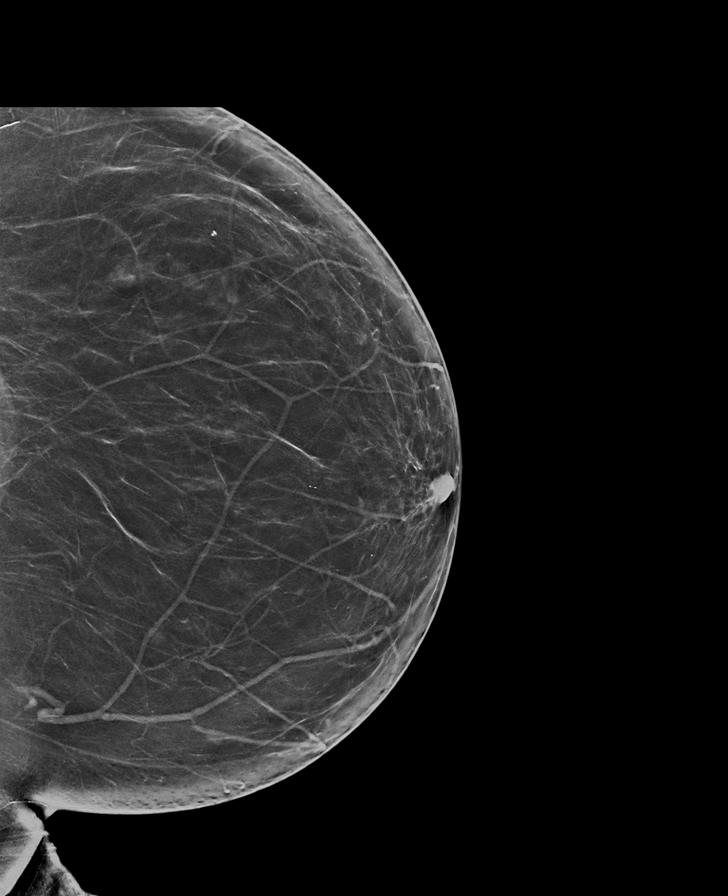

[R MLO synth-2D]
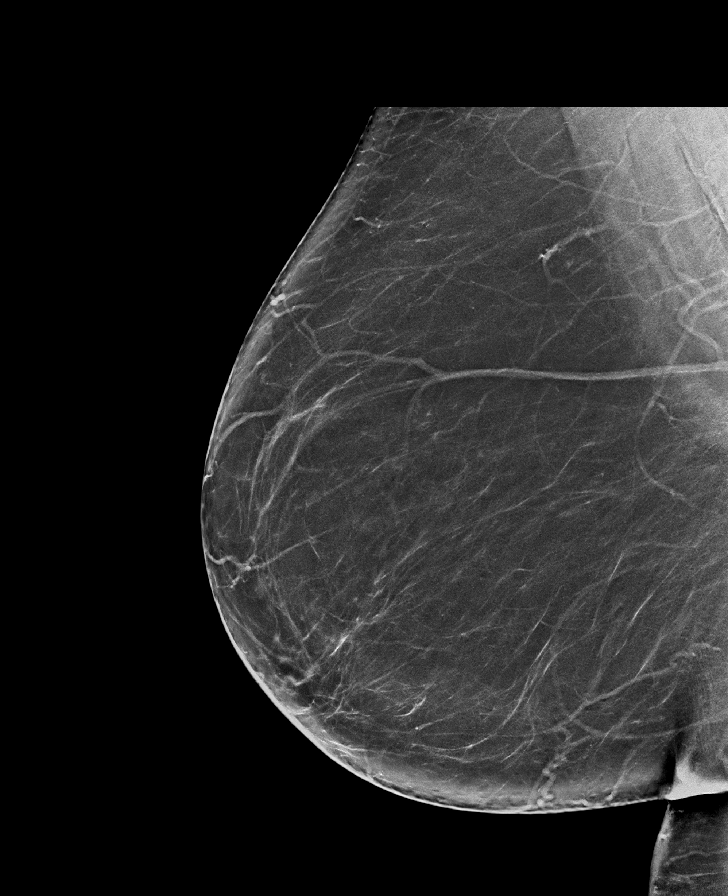

[L MLO synth-2D]
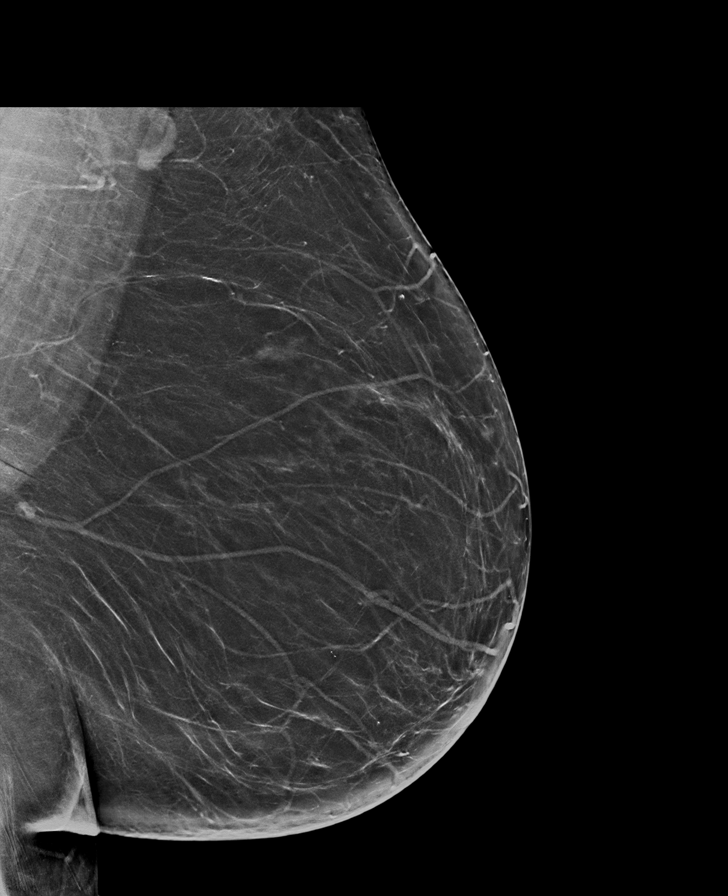

[R CC synth-2D]
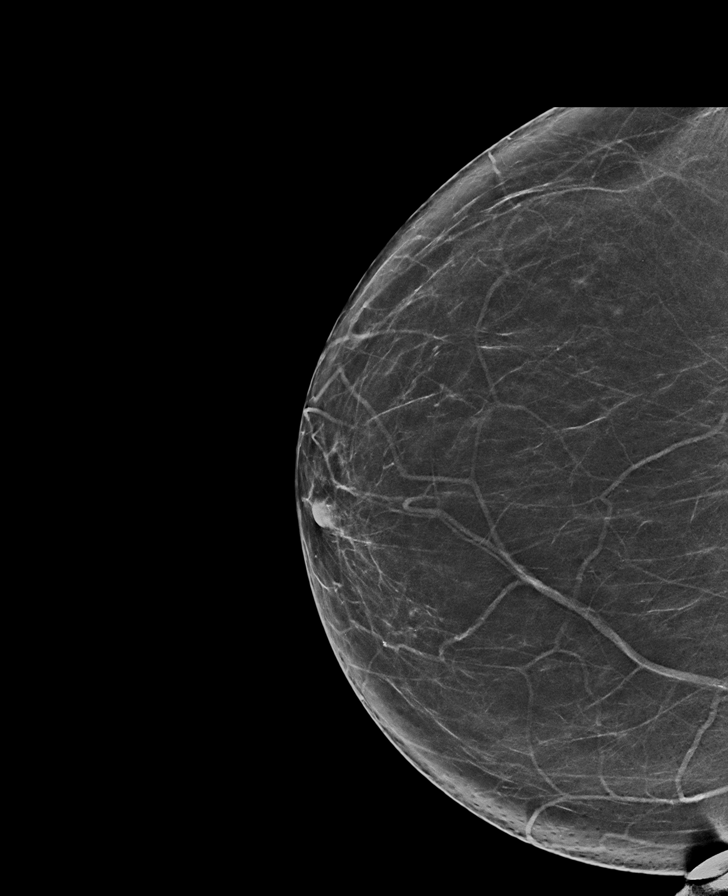

[R MLO tomo · tomo slice 43/84.0]
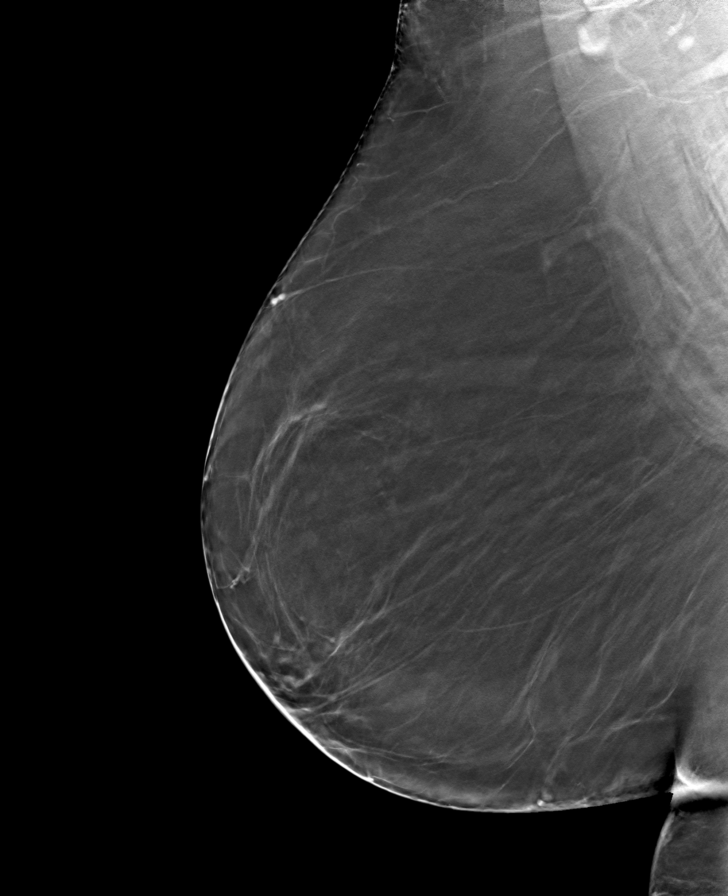

[L MLO tomo · tomo slice 40/79.0]
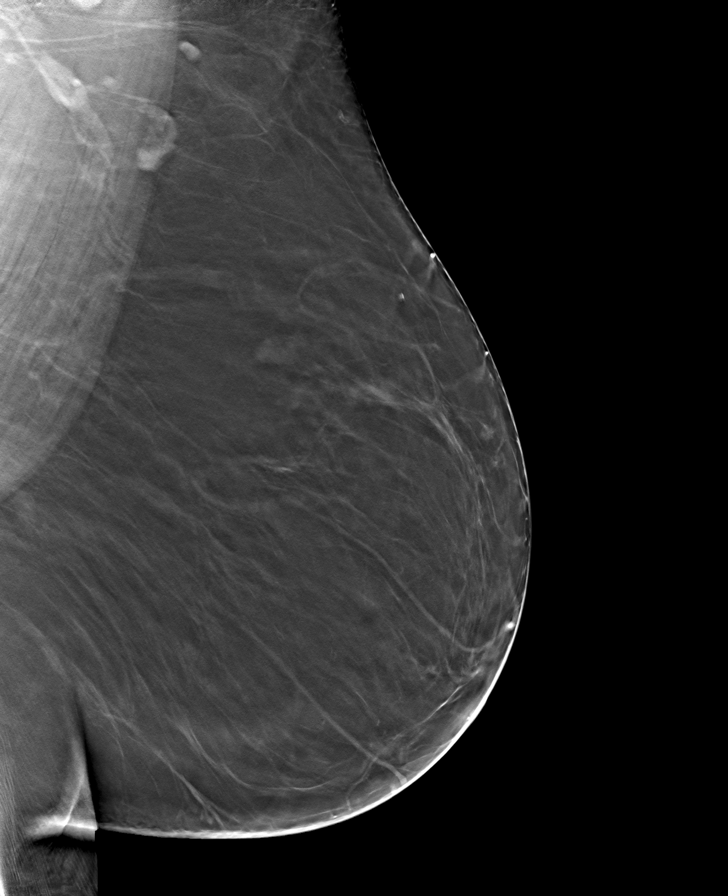

[R CC tomo · tomo slice 37/73.0]
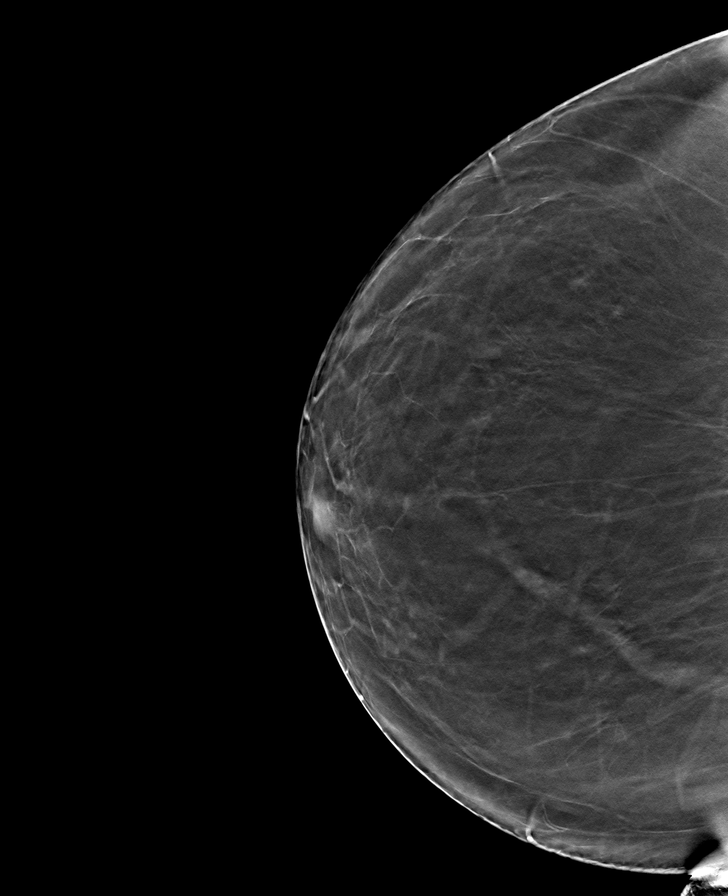

[L CC tomo · tomo slice 39/78.0]
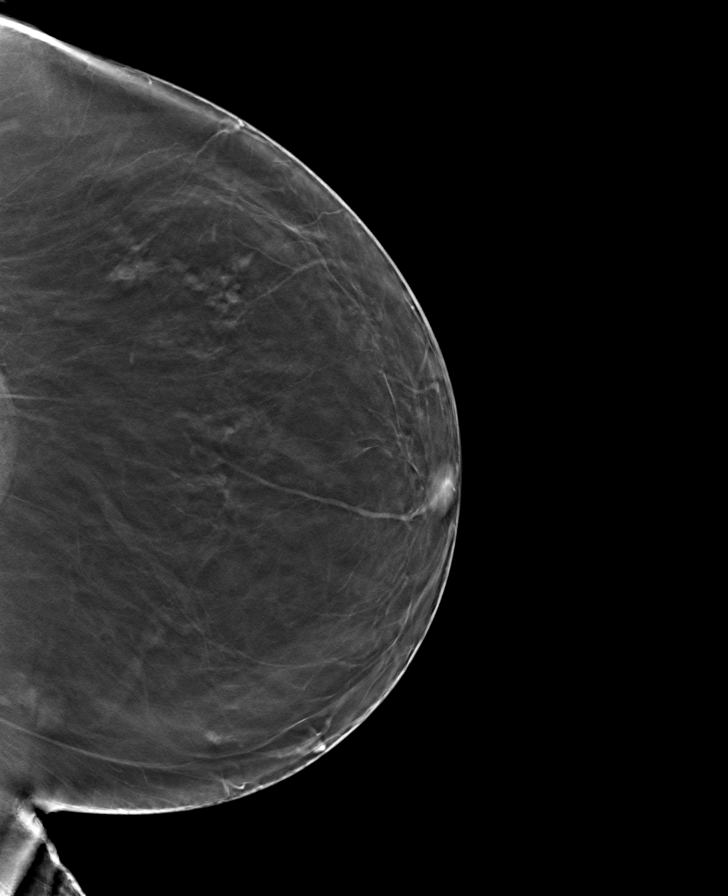

[8 of 24 positions shown; findings below may reference images not displayed]

ACR Breast Density Category b: There are scattered areas of
fibroglandular density.
FINDINGS: There are no findings suspicious for malignancy. Images were
processed with CAD.
IMPRESSION: No mammographic evidence of malignancy. A result letter of this
screening mammogram will be mailed directly to the patient.

RECOMMENDATION:
Screening mammogram in one year. (Code:[id number])

BI-RADS CATEGORY  1: Negative.

## 2022-04-08 ENCOUNTER — Other Ambulatory Visit (HOSPITAL_COMMUNITY): Payer: Self-pay | Admitting: Nephrology

## 2022-04-08 DIAGNOSIS — I5032 Chronic diastolic (congestive) heart failure: Secondary | ICD-10-CM

## 2022-04-08 DIAGNOSIS — I129 Hypertensive chronic kidney disease with stage 1 through stage 4 chronic kidney disease, or unspecified chronic kidney disease: Secondary | ICD-10-CM | POA: Diagnosis not present

## 2022-04-08 DIAGNOSIS — R82998 Other abnormal findings in urine: Secondary | ICD-10-CM

## 2022-04-08 DIAGNOSIS — D638 Anemia in other chronic diseases classified elsewhere: Secondary | ICD-10-CM

## 2022-04-08 DIAGNOSIS — E1122 Type 2 diabetes mellitus with diabetic chronic kidney disease: Secondary | ICD-10-CM

## 2022-04-08 DIAGNOSIS — Z6839 Body mass index (BMI) 39.0-39.9, adult: Secondary | ICD-10-CM | POA: Diagnosis not present

## 2022-04-08 DIAGNOSIS — N189 Chronic kidney disease, unspecified: Secondary | ICD-10-CM | POA: Diagnosis not present

## 2022-04-15 DIAGNOSIS — Z6839 Body mass index (BMI) 39.0-39.9, adult: Secondary | ICD-10-CM | POA: Diagnosis not present

## 2022-04-15 DIAGNOSIS — N189 Chronic kidney disease, unspecified: Secondary | ICD-10-CM | POA: Diagnosis not present

## 2022-04-15 DIAGNOSIS — E1122 Type 2 diabetes mellitus with diabetic chronic kidney disease: Secondary | ICD-10-CM | POA: Diagnosis not present

## 2022-04-15 DIAGNOSIS — I5032 Chronic diastolic (congestive) heart failure: Secondary | ICD-10-CM | POA: Diagnosis not present

## 2022-04-15 DIAGNOSIS — R82998 Other abnormal findings in urine: Secondary | ICD-10-CM | POA: Diagnosis not present

## 2022-04-15 DIAGNOSIS — I129 Hypertensive chronic kidney disease with stage 1 through stage 4 chronic kidney disease, or unspecified chronic kidney disease: Secondary | ICD-10-CM | POA: Diagnosis not present

## 2022-04-15 DIAGNOSIS — D638 Anemia in other chronic diseases classified elsewhere: Secondary | ICD-10-CM | POA: Diagnosis not present

## 2022-04-18 ENCOUNTER — Ambulatory Visit (HOSPITAL_COMMUNITY)
Admission: RE | Admit: 2022-04-18 | Discharge: 2022-04-18 | Disposition: A | Discharge status: Home / Self Care | Payer: Medicare HMO | Source: Ambulatory Visit | Attending: Nephrology | Admitting: Nephrology

## 2022-04-18 DIAGNOSIS — N189 Chronic kidney disease, unspecified: Secondary | ICD-10-CM | POA: Diagnosis not present

## 2022-04-18 DIAGNOSIS — D638 Anemia in other chronic diseases classified elsewhere: Secondary | ICD-10-CM | POA: Insufficient documentation

## 2022-04-18 DIAGNOSIS — I5032 Chronic diastolic (congestive) heart failure: Secondary | ICD-10-CM | POA: Insufficient documentation

## 2022-04-18 DIAGNOSIS — N281 Cyst of kidney, acquired: Secondary | ICD-10-CM | POA: Diagnosis not present

## 2022-04-18 DIAGNOSIS — R339 Retention of urine, unspecified: Secondary | ICD-10-CM | POA: Diagnosis not present

## 2022-04-18 DIAGNOSIS — E1122 Type 2 diabetes mellitus with diabetic chronic kidney disease: Secondary | ICD-10-CM | POA: Diagnosis not present

## 2022-04-18 DIAGNOSIS — R82998 Other abnormal findings in urine: Secondary | ICD-10-CM | POA: Insufficient documentation

## 2022-04-18 DIAGNOSIS — N181 Chronic kidney disease, stage 1: Secondary | ICD-10-CM | POA: Insufficient documentation

## 2022-04-18 DIAGNOSIS — I129 Hypertensive chronic kidney disease with stage 1 through stage 4 chronic kidney disease, or unspecified chronic kidney disease: Secondary | ICD-10-CM | POA: Insufficient documentation

## 2022-05-10 ENCOUNTER — Inpatient Hospital Stay: Payer: Medicare HMO | Attending: Hematology

## 2022-05-10 DIAGNOSIS — Z803 Family history of malignant neoplasm of breast: Secondary | ICD-10-CM | POA: Insufficient documentation

## 2022-05-10 DIAGNOSIS — E785 Hyperlipidemia, unspecified: Secondary | ICD-10-CM | POA: Diagnosis not present

## 2022-05-10 DIAGNOSIS — R5383 Other fatigue: Secondary | ICD-10-CM | POA: Insufficient documentation

## 2022-05-10 DIAGNOSIS — Z887 Allergy status to serum and vaccine status: Secondary | ICD-10-CM | POA: Diagnosis not present

## 2022-05-10 DIAGNOSIS — Z800 Family history of malignant neoplasm of digestive organs: Secondary | ICD-10-CM | POA: Insufficient documentation

## 2022-05-10 DIAGNOSIS — I890 Lymphedema, not elsewhere classified: Secondary | ICD-10-CM | POA: Diagnosis not present

## 2022-05-10 DIAGNOSIS — D508 Other iron deficiency anemias: Secondary | ICD-10-CM

## 2022-05-10 DIAGNOSIS — E559 Vitamin D deficiency, unspecified: Secondary | ICD-10-CM | POA: Insufficient documentation

## 2022-05-10 DIAGNOSIS — Z8719 Personal history of other diseases of the digestive system: Secondary | ICD-10-CM | POA: Insufficient documentation

## 2022-05-10 DIAGNOSIS — Z811 Family history of alcohol abuse and dependence: Secondary | ICD-10-CM | POA: Insufficient documentation

## 2022-05-10 DIAGNOSIS — D472 Monoclonal gammopathy: Secondary | ICD-10-CM | POA: Diagnosis not present

## 2022-05-10 DIAGNOSIS — D563 Thalassemia minor: Secondary | ICD-10-CM | POA: Diagnosis not present

## 2022-05-10 DIAGNOSIS — E1122 Type 2 diabetes mellitus with diabetic chronic kidney disease: Secondary | ICD-10-CM | POA: Diagnosis not present

## 2022-05-10 DIAGNOSIS — N1832 Chronic kidney disease, stage 3b: Secondary | ICD-10-CM | POA: Diagnosis not present

## 2022-05-10 DIAGNOSIS — D509 Iron deficiency anemia, unspecified: Secondary | ICD-10-CM | POA: Insufficient documentation

## 2022-05-10 DIAGNOSIS — Z8379 Family history of other diseases of the digestive system: Secondary | ICD-10-CM | POA: Diagnosis not present

## 2022-05-10 DIAGNOSIS — Z823 Family history of stroke: Secondary | ICD-10-CM | POA: Insufficient documentation

## 2022-05-10 DIAGNOSIS — Z880 Allergy status to penicillin: Secondary | ICD-10-CM | POA: Diagnosis not present

## 2022-05-10 DIAGNOSIS — E538 Deficiency of other specified B group vitamins: Secondary | ICD-10-CM | POA: Diagnosis not present

## 2022-05-10 DIAGNOSIS — Z833 Family history of diabetes mellitus: Secondary | ICD-10-CM | POA: Diagnosis not present

## 2022-05-10 DIAGNOSIS — Z9049 Acquired absence of other specified parts of digestive tract: Secondary | ICD-10-CM | POA: Insufficient documentation

## 2022-05-10 DIAGNOSIS — I129 Hypertensive chronic kidney disease with stage 1 through stage 4 chronic kidney disease, or unspecified chronic kidney disease: Secondary | ICD-10-CM | POA: Diagnosis not present

## 2022-05-10 DIAGNOSIS — Z8249 Family history of ischemic heart disease and other diseases of the circulatory system: Secondary | ICD-10-CM | POA: Insufficient documentation

## 2022-05-10 DIAGNOSIS — Z79899 Other long term (current) drug therapy: Secondary | ICD-10-CM | POA: Diagnosis not present

## 2022-05-10 DIAGNOSIS — E669 Obesity, unspecified: Secondary | ICD-10-CM | POA: Diagnosis not present

## 2022-05-10 LAB — CBC WITH DIFFERENTIAL/PLATELET
Abs Immature Granulocytes: 0.03 10*3/uL (ref 0.00–0.07)
Basophils Absolute: 0 10*3/uL (ref 0.0–0.1)
Basophils Relative: 0 %
Eosinophils Absolute: 0.4 10*3/uL (ref 0.0–0.5)
Eosinophils Relative: 6 %
HCT: 34.7 % — ABNORMAL LOW (ref 36.0–46.0)
Hemoglobin: 10.4 g/dL — ABNORMAL LOW (ref 12.0–15.0)
Immature Granulocytes: 1 %
Lymphocytes Relative: 20 %
Lymphs Abs: 1.2 10*3/uL (ref 0.7–4.0)
MCH: 21.6 pg — ABNORMAL LOW (ref 26.0–34.0)
MCHC: 30 g/dL (ref 30.0–36.0)
MCV: 72 fL — ABNORMAL LOW (ref 80.0–100.0)
Monocytes Absolute: 0.5 10*3/uL (ref 0.1–1.0)
Monocytes Relative: 9 %
Neutro Abs: 3.7 10*3/uL (ref 1.7–7.7)
Neutrophils Relative %: 64 %
Platelets: 263 10*3/uL (ref 150–400)
RBC: 4.82 MIL/uL (ref 3.87–5.11)
RDW: 17 % — ABNORMAL HIGH (ref 11.5–15.5)
WBC: 5.8 10*3/uL (ref 4.0–10.5)
nRBC: 0 % (ref 0.0–0.2)

## 2022-05-10 LAB — IRON AND TIBC
Iron: 46 ug/dL (ref 28–170)
Saturation Ratios: 15 % (ref 10.4–31.8)
TIBC: 307 ug/dL (ref 250–450)
UIBC: 261 ug/dL

## 2022-05-10 LAB — FERRITIN: Ferritin: 276 ng/mL (ref 11–307)

## 2022-05-10 LAB — VITAMIN B12: Vitamin B-12: 675 pg/mL (ref 180–914)

## 2022-05-10 LAB — FOLATE: Folate: 32.3 ng/mL (ref >5.9)

## 2022-05-10 LAB — VITAMIN D 25 HYDROXY (VIT D DEFICIENCY, FRACTURES): Vit D, 25-Hydroxy: 41.15 ng/mL (ref 30–100)

## 2022-05-13 DIAGNOSIS — I5032 Chronic diastolic (congestive) heart failure: Secondary | ICD-10-CM | POA: Diagnosis not present

## 2022-05-13 DIAGNOSIS — R778 Other specified abnormalities of plasma proteins: Secondary | ICD-10-CM | POA: Diagnosis not present

## 2022-05-13 DIAGNOSIS — D472 Monoclonal gammopathy: Secondary | ICD-10-CM | POA: Diagnosis not present

## 2022-05-13 DIAGNOSIS — N189 Chronic kidney disease, unspecified: Secondary | ICD-10-CM | POA: Diagnosis not present

## 2022-05-13 DIAGNOSIS — Z6839 Body mass index (BMI) 39.0-39.9, adult: Secondary | ICD-10-CM | POA: Diagnosis not present

## 2022-05-13 DIAGNOSIS — Z832 Family history of diseases of the blood and blood-forming organs and certain disorders involving the immune mechanism: Secondary | ICD-10-CM | POA: Diagnosis not present

## 2022-05-13 DIAGNOSIS — R768 Other specified abnormal immunological findings in serum: Secondary | ICD-10-CM | POA: Diagnosis not present

## 2022-05-13 DIAGNOSIS — E211 Secondary hyperparathyroidism, not elsewhere classified: Secondary | ICD-10-CM | POA: Diagnosis not present

## 2022-05-13 DIAGNOSIS — D638 Anemia in other chronic diseases classified elsewhere: Secondary | ICD-10-CM | POA: Diagnosis not present

## 2022-05-14 LAB — METHYLMALONIC ACID, SERUM: Methylmalonic Acid, Quantitative: 184 nmol/L (ref 0–378)

## 2022-05-17 ENCOUNTER — Telehealth: Payer: Medicare HMO | Admitting: Nurse Practitioner

## 2022-05-17 ENCOUNTER — Ambulatory Visit (INDEPENDENT_AMBULATORY_CARE_PROVIDER_SITE_OTHER): Payer: Medicare HMO | Admitting: Family Medicine

## 2022-05-17 ENCOUNTER — Encounter: Payer: Self-pay | Admitting: Family Medicine

## 2022-05-17 VITALS — BP 110/72 | Ht 66.0 in | Wt 242.0 lb

## 2022-05-17 DIAGNOSIS — D508 Other iron deficiency anemias: Secondary | ICD-10-CM | POA: Diagnosis not present

## 2022-05-17 DIAGNOSIS — I10 Essential (primary) hypertension: Secondary | ICD-10-CM

## 2022-05-17 DIAGNOSIS — E785 Hyperlipidemia, unspecified: Secondary | ICD-10-CM | POA: Diagnosis not present

## 2022-05-17 DIAGNOSIS — E1159 Type 2 diabetes mellitus with other circulatory complications: Secondary | ICD-10-CM

## 2022-05-17 MED ORDER — DEXCOM G7 SENSOR MISC: 12 refills | Status: DC

## 2022-05-17 MED ORDER — DEXCOM G7 RECEIVER DEVI: 12 refills | Status: AC

## 2022-05-17 NOTE — Patient Instructions (Addendum)
F/U in 6 to 8 weeks with bloood sugar  log, call if you need me before  Increase Trulicity to 3 mg weekly so take two 1.5 mg doses every week  Document and check blood sugar at least twice daily Goal for fasting blood sugar ranges from 80 to 120 and 2 hours after any meal or at bedtime should be between 130 to 170.    You are referred to diabetic educator , Kieth Brightly, very  important that you go  Please keep in touch with Nurse so that you DO get the testing supplies that you need  It is important that you exercise regularly at least 30 minutes 5 times a week. If you develop chest pain, have severe difficulty breathing, or feel very tired, stop exercising immediately and seek medical attention  Thanks for choosing [facility], we consider it a privelige to serve you.    Follow-up

## 2022-05-19 ENCOUNTER — Encounter: Payer: Self-pay | Admitting: Family Medicine

## 2022-05-19 NOTE — Assessment & Plan Note (Addendum)
Deteriorate with weight gain Patient re-educated about  the importance of commitment to a  minimum of 150 minutes of exercise per week as able.  The importance of healthy food choices with portion control discussed, as well as eating regularly and within a 12 hour window most days. The need to choose "clean , green" food 50 to 75% of the time is discussed, as well as to make water the primary drink and set a goal of 64 ounces water daily.       05/17/2022   11:18 AM 03/06/2022    9:29 AM 02/12/2022   10:15 AM  Weight /BMI  Weight 242 lb 239 lb 237 lb  Height '5\' 6"'$  (1.676 m) '5\' 6"'$  (1.676 m) '5\' 6"'$  (1.676 m)  BMI 39.06 kg/m2 38.58 kg/m2 38.25 kg/m2

## 2022-05-19 NOTE — Assessment & Plan Note (Signed)
Controlled, no change in medication DASH diet and commitment to daily physical activity for a minimum of 30 minutes discussed and encouraged, as a part of hypertension management. The importance of attaining a healthy weight is also discussed.     05/17/2022   11:54 AM 05/17/2022   11:18 AM 03/06/2022    9:29 AM 02/12/2022   11:05 AM 02/12/2022   10:15 AM 08/07/2021    9:49 AM 05/10/2021   10:54 AM  BP/Weight  Systolic BP 841 660 630 160 109 323 557  Diastolic BP 72 74 57 72 68 76 75  Wt. (Lbs)  242 239  237 224 236.99  BMI  39.06 kg/m2 38.58 kg/m2  38.25 kg/m2 36.15 kg/m2 38.25 kg/m2

## 2022-05-19 NOTE — Progress Notes (Signed)
DAVY FAUGHT     MRN: [id number]      DOB: [birth year]   HPI Ms. Paske is here for follow up and re-evaluation of chronic medical conditions, medication management and review of any available recent lab and radiology data.  Preventive health is updated, specifically  Cancer screening and Immunization.   The PT denies any adverse reactions to current medications since the last visit.  Not diligent with diet and still does not exercise  ROS Denies recent fever or chills. Denies sinus pressure, nasal congestion, ear pain or sore throat. Denies chest congestion, productive cough or wheezing. Denies chest pains, palpitations and leg swelling Denies abdominal pain, nausea, vomiting,diarrhea or constipation.   Denies dysuria, frequency, hesitancy or incontinence.  Denies headaches, seizures, numbness, or tingling. Denies depression, anxiety or insomnia. Denies skin break down or rash.   PE  BP 110/72   Ht '5\' 6"'$  (1.676 m)   Wt 242 lb (109.8 kg)   SpO2 96%   BMI 39.06 kg/m   Patient alert and oriented and in no cardiopulmonary distress.  HEENT: No facial asymmetry, EOMI,     Neck supple .  Chest: Clear to auscultation bilaterally.  CVS: S1, S2 no murmurs, no S3.Regular rate.  ABD: Soft non tender.   Ext: No edema  MS: Adequate ROM spine, shoulders, hips and reduced in knee.  Skin: Intact, no ulcerations or rash noted.  Psych: Good eye contact, normal affect. Memory intact not anxious or depressed appearing.  CNS: CN 2-12 intact, power,  normal throughout.no focal deficits noted.   Assessment & Plan  Essential hypertension Controlled, no change in medication DASH diet and commitment to daily physical activity for a minimum of 30 minutes discussed and encouraged, as a part of hypertension management. The importance of attaining a healthy weight is also discussed.     05/17/2022   11:54 AM 05/17/2022   11:18 AM 03/06/2022    9:29 AM 02/12/2022   11:05 AM  02/12/2022   10:15 AM 08/07/2021    9:49 AM 05/10/2021   10:54 AM  BP/Weight  Systolic BP 485 462 703 500 938 182 993  Diastolic BP 72 74 57 72 68 76 75  Wt. (Lbs)  242 239  237 224 236.99  BMI  39.06 kg/m2 38.58 kg/m2  38.25 kg/m2 36.15 kg/m2 38.25 kg/m2       Type 2 diabetes mellitus with vascular disease ([place]) Ms. Victorino is reminded of the importance of commitment to daily physical activity for 30 minutes or more, as able and the need to limit carbohydrate intake to 30 to 60 grams per meal to help with blood sugar control.   The need to take medication as prescribed, test blood sugar as directed, and to call between visits if there is a concern that blood sugar is uncontrolled is also discussed.  Patient is currently diabetic educator and dose of trulicity Is increased   Ms. Newcomer is reminded of the importance of daily foot exam, annual eye examination, and good blood sugar, blood pressure and cholesterol control.     Latest Ref Rng & Units 02/12/2022   11:30 AM 08/07/2021   10:41 AM 08/01/2021    8:49 AM 06/05/2021    8:07 AM 05/04/2021    8:07 AM  Diabetic Labs  HbA1c 4.8 - 5.6 % 10.2  10.0    7.2   Micro/Creat Ratio 0 - 29 mg/g creat 6       Chol 100 - 199 mg/dL 120  101     HDL >39 mg/dL 53   47     Calc LDL 0 - 99 mg/dL 50   42     Triglycerides 0 - 149 mg/dL 87   51     Creatinine 0.57 - 1.00 mg/dL 1.55  1.43   1.40  1.25       05/17/2022   11:54 AM 05/17/2022   11:18 AM 03/06/2022    9:29 AM 02/12/2022   11:05 AM 02/12/2022   10:15 AM 08/07/2021    9:49 AM 05/10/2021   10:54 AM  BP/Weight  Systolic BP 309 407 680 881 103 159 458  Diastolic BP 72 74 57 72 68 76 75  Wt. (Lbs)  242 239  237 224 236.99  BMI  39.06 kg/m2 38.58 kg/m2  38.25 kg/m2 36.15 kg/m2 38.25 kg/m2      Latest Ref Rng & Units 02/21/2022   12:00 AM 02/12/2022   10:00 AM  Foot/eye exam completion dates  Eye Exam No Retinopathy No Retinopathy       Foot Form Completion   Done     This result  is from an external source.      Uncontrolled refer nutrition ed, inc dose of trulicity  Morbid obesity Deteriorate with weight gain Patient re-educated about  the importance of commitment to a  minimum of 150 minutes of exercise per week as able.  The importance of healthy food choices with portion control discussed, as well as eating regularly and within a 12 hour window most days. The need to choose "clean , green" food 50 to 75% of the time is discussed, as well as to make water the primary drink and set a goal of 64 ounces water daily.       05/17/2022   11:18 AM 03/06/2022    9:29 AM 02/12/2022   10:15 AM  Weight /BMI  Weight 242 lb 239 lb 237 lb  Height '5\' 6"'$  (1.676 m) '5\' 6"'$  (1.676 m) '5\' 6"'$  (1.676 m)  BMI 39.06 kg/m2 38.58 kg/m2 38.25 kg/m2      Hyperlipidemia LDL goal <70 Hyperlipidemia:Low fat diet discussed and encouraged.   Lipid Panel  Lab Results  Component Value Date   CHOL 120 02/12/2022   HDL 53 02/12/2022   LDLCALC 50 02/12/2022   TRIG 87 02/12/2022   CHOLHDL 2.3 02/12/2022     Controlled, no change in medication   IDA (iron deficiency anemia) Followed by. [facility]

## 2022-05-19 NOTE — Assessment & Plan Note (Signed)
Followed by. [facility]

## 2022-05-19 NOTE — Assessment & Plan Note (Addendum)
Helen Mahoney is reminded of the importance of commitment to daily physical activity for 30 minutes or more, as able and the need to limit carbohydrate intake to 30 to 60 grams per meal to help with blood sugar control.   The need to take medication as prescribed, test blood sugar as directed, and to call between visits if there is a concern that blood sugar is uncontrolled is also discussed.  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     Latest Ref Rng & Units 02/12/2022   11:30 AM 08/07/2021   10:41 AM 08/01/2021    8:49 AM 06/05/2021    8:07 AM 05/04/2021    8:07 AM  Diabetic Labs  HbA1c 4.8 - 5.6 % 10.2  10.0    7.2   Micro/Creat Ratio 0 - 29 mg/g creat 6       Chol 100 - 199 mg/dL 120   101     HDL >39 mg/dL 53   47     Calc LDL 0 - 99 mg/dL 50   42     Triglycerides 0 - 149 mg/dL 87   51     Creatinine 0.57 - 1.00 mg/dL 1.55  1.43   1.40  1.25       05/17/2022   11:54 AM 05/17/2022   11:18 AM 03/06/2022    9:29 AM 02/12/2022   11:05 AM 02/12/2022   10:15 AM 08/07/2021    9:49 AM 05/10/2021   10:54 AM  BP/Weight  Systolic BP 947 096 283 662 947 654 650  Diastolic BP 72 74 57 72 68 76 75  Wt. (Lbs)  242 239  237 224 236.99  BMI  39.06 kg/m2 38.58 kg/m2  38.25 kg/m2 36.15 kg/m2 38.25 kg/m2      Latest Ref Rng & Units 02/21/2022   12:00 AM 02/12/2022   10:00 AM  Foot/eye exam completion dates  Eye Exam No Retinopathy No Retinopathy       Foot Form Completion   Done     This result is from an external source.      Uncontrolled refer nutrition ed, inc dose of trulicity

## 2022-05-19 NOTE — Assessment & Plan Note (Signed)
Hyperlipidemia:Low fat diet discussed and encouraged.   Lipid Panel  Lab Results  Component Value Date   CHOL 120 02/12/2022   HDL 53 02/12/2022   LDLCALC 50 02/12/2022   TRIG 87 02/12/2022   CHOLHDL 2.3 02/12/2022     Controlled, no change in medication

## 2022-05-23 ENCOUNTER — Encounter: Payer: Self-pay | Admitting: Physician Assistant

## 2022-05-23 ENCOUNTER — Inpatient Hospital Stay: Payer: Medicare HMO

## 2022-05-23 ENCOUNTER — Inpatient Hospital Stay (HOSPITAL_BASED_OUTPATIENT_CLINIC_OR_DEPARTMENT_OTHER): Payer: Medicare HMO | Admitting: Physician Assistant

## 2022-05-23 VITALS — BP 118/67 | HR 88 | Temp 99.3°F | Resp 18 | Wt 233.0 lb

## 2022-05-23 DIAGNOSIS — E538 Deficiency of other specified B group vitamins: Secondary | ICD-10-CM

## 2022-05-23 DIAGNOSIS — E559 Vitamin D deficiency, unspecified: Secondary | ICD-10-CM | POA: Diagnosis not present

## 2022-05-23 DIAGNOSIS — R778 Other specified abnormalities of plasma proteins: Secondary | ICD-10-CM | POA: Diagnosis not present

## 2022-05-23 DIAGNOSIS — D509 Iron deficiency anemia, unspecified: Secondary | ICD-10-CM | POA: Diagnosis not present

## 2022-05-23 DIAGNOSIS — D508 Other iron deficiency anemias: Secondary | ICD-10-CM | POA: Diagnosis not present

## 2022-05-23 DIAGNOSIS — D472 Monoclonal gammopathy: Secondary | ICD-10-CM | POA: Diagnosis not present

## 2022-05-23 DIAGNOSIS — E669 Obesity, unspecified: Secondary | ICD-10-CM | POA: Diagnosis not present

## 2022-05-23 DIAGNOSIS — R5383 Other fatigue: Secondary | ICD-10-CM | POA: Diagnosis not present

## 2022-05-23 DIAGNOSIS — I129 Hypertensive chronic kidney disease with stage 1 through stage 4 chronic kidney disease, or unspecified chronic kidney disease: Secondary | ICD-10-CM | POA: Diagnosis not present

## 2022-05-23 DIAGNOSIS — D563 Thalassemia minor: Secondary | ICD-10-CM

## 2022-05-23 DIAGNOSIS — N1832 Chronic kidney disease, stage 3b: Secondary | ICD-10-CM | POA: Diagnosis not present

## 2022-05-23 LAB — LACTATE DEHYDROGENASE: LDH: 167 U/L (ref 98–192)

## 2022-05-23 NOTE — Progress Notes (Signed)
Helen Mahoney, [place] [address]   CLINIC:  Medical Oncology/Hematology  PCP:  Fayrene Helper, MD [address] [place] [address] [phone]   REASON FOR VISIT:  Follow-up for alpha thalassemia minor, iron deficiency anemia, and B12 deficiency + new MGUS   PRIOR THERAPY: IV iron (last given April 2017)   CURRENT THERAPY: Oral iron supplementation  INTERVAL HISTORY:   Helen Mahoney 72 y.o. female returns for routine follow-up of alpha thalassemia minor, iron deficiency, and B12 deficiency, as well as to discuss new findings of possible MGUS by nephrologist.  She was last evaluated by Tarri Abernethy PA-C on 11/09/2021.  At today's visit, she reports feeling like her usual self and denies any recent changes in her baseline health status.  She denies any recent bleeding such as epistaxis, hematemesis, hematochezia, or melena. She reports that her energy is at baseline with chronic fatigue.  She denies any pica, restless legs, headaches, lightheadedness, or syncope.  No chest pain or dyspnea on exertion.  She continues to take her oral iron, B12, folate, and vitamin D supplements.   She continues to follow with Dr. Theador Hawthorne (nephrology) for chronic kidney disease.  At most recent nephrology visit on 05/13/2022, Dr. 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  She denies any new onset bone pain, fractures, or B symptoms.  She denies any peripheral neuropathy or neurologic changes.  She has 50% energy and 100% appetite. She endorses that she is maintaining a stable weight.  ASSESSMENT & PLAN:  1.   Microcytic anemia with alpha thalassemia minor - Longstanding microcytic anemia dating back to before 2008, alpha thalassemia genotype study done on 08/17/2015 - Ongoing mild (Hgb > 10.0) microcytic anemia attributable to alpha  thalassemia trait - She has previously required IV Feraheme (last given April 2017), currently on daily oral iron supplement  - Most recent colonoscopy (08/07/2020): Nonbleeding internal hemorrhoids, diverticulosis, polyp x1 - EGD (05/15/2015): Gastric polyps, GE junction stricture - Capsule study (06/02/2015): Occasional erosion in the duodenum.  No masses, ulcers, or AVMs seen.  Occasional lymphangiectasia. - SPEP checked in 2017 was normal.  Hemoccult stool checked in 2012 was negative. - Her baseline hemoglobin ranges from 9.5-11.0 - Most recent labs (05/10/2022): Hgb at baseline 10.4/MCV 72.0, ferritin 276, iron saturation 15% - No bright red blood per rectum or melena - Chronic fatigue is at baseline - PLAN: No indication for IV iron at this time. - Recommend DECREASING iron supplement to EVERY OTHER DAY - Repeat labs and RTC in 6 months.  2.  Abnormal SPEP concerning for possible MGUS - She continues to follow with Dr. Theador Hawthorne (nephrology) for chronic kidney disease.  At most recent nephrology visit on 05/13/2022, Dr. Theador Hawthorne noted that patient had mildly elevated free light chain ratio. - Labs from Dr. Theador Hawthorne (04/15/2022): Elevated kappa light chain 66.5, elevated lambda 30.1.  Ratio mildly elevated at 2.21, difficult to interpret in the setting of CKD. SPEP showed poorly defined band of restricted protein mobility in gammaglobulin region, but no quantifiable M spike. 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 Urine immunofixation showed polyclonal light chains, negative for monoclonal immunoglobulin or free light chain - No new onset bone pain or neurologic changes.  No B symptoms. - We discussed spectrum of plasma cell dyscrasia ranging from MGUS to multiple myeloma, discussed with patient that  further testing is needed to confirm where she is in relation to that. - PLAN: We will check labs today to include SPEP, serum immunofixation, light chains, LDH, and beta-2  microglobulin.  Will see her for office visit in the next few weeks to discuss results in detail. - If monoclonal protein is confirmed, we will check skeletal survey.   3.  Vitamin B12 deficiency - Patient had been receiving monthly B12 injections since February 2017 - Labs in November 2021 showed vitamin B12 level > 7500, therefore injections were held - She was placed on oral B12 tablets in June 2022   - Most recent B12 level (05/10/2022) is 675, normal methylmalonic acid - PLAN: Continue oral B12 supplementation.  Check B12/MMA at follow-up visit in 6 months.   4.  Folate deficiency - Labs on 10/03/2020 showed folate decreased at 5.4 - She was started on folic acid supplementation in June 2022 - Most recent folic acid is normal at 32.3 - PLAN: Continue daily folic acid supplementation.  We will recheck levels at follow-up visit in 6 months.   5.  Vitamin D deficiency - She takes vitamin D 2,000 units daily - Most recent vitamin D (05/10/2022) is normal at 41.15 - PLAN: Continue vitamin D 2000 units daily.  We will recheck levels at follow-up visit in 6 months.    PLAN SUMMARY: >> Labs TODAY (SPEP, serum immunofixation, light chains, LDH, beta-2 microglobulin)  >> OFFICE visit in 3-4 weeks to discuss results >> Labs in 6 months (CBC/D, CMP, ferritin, iron/TIBC, B12, MMA, vitamin D,SPEP, serum immunofixation, light chains, LDH, beta-2 microglobulin) >> OFFICE visit in 6 months (1 week after labs)     REVIEW OF SYSTEMS:   Review of Systems  Constitutional:  Positive for fatigue. Negative for appetite change, chills, diaphoresis, fever and unexpected weight change.  HENT:   Negative for lump/mass and nosebleeds.   Eyes:  Negative for eye problems.  Respiratory:  Negative for cough, hemoptysis and shortness of breath.   Cardiovascular:  Negative for chest pain, leg swelling and palpitations.  Gastrointestinal:  Negative for abdominal pain, blood in stool, constipation, diarrhea, nausea  and vomiting.  Genitourinary:  Negative for hematuria.   Skin: Negative.   Neurological:  Negative for dizziness, headaches and light-headedness.  Hematological:  Does not bruise/bleed easily.     PHYSICAL EXAM:  ECOG PERFORMANCE STATUS: 1 - Symptomatic but completely ambulatory  There were no vitals filed for this visit. There were no vitals filed for this visit. Physical Exam Constitutional:      Appearance: Normal appearance. She is obese.  HENT:     Head: Normocephalic and atraumatic.     Mouth/Throat:     Mouth: Mucous membranes are moist.  Eyes:     Extraocular Movements: Extraocular movements intact.     Pupils: Pupils are equal, round, and reactive to light.  Cardiovascular:     Rate and Rhythm: Normal rate and regular rhythm.     Pulses: Normal pulses.     Heart sounds: Normal heart sounds.  Pulmonary:     Effort: Pulmonary effort is normal.     Breath sounds: Normal breath sounds.  Abdominal:     General: Bowel sounds are normal.     Palpations: Abdomen is soft.     Tenderness: There is no abdominal tenderness.  Musculoskeletal:        General: No swelling.     Right lower leg: No edema.     Left lower leg: No  edema.  Lymphadenopathy:     Cervical: No cervical adenopathy.  Skin:    General: Skin is warm and dry.  Neurological:     General: No focal deficit present.     Mental Status: She is alert and oriented to person, place, and time.  Psychiatric:        Mood and Affect: Mood normal.        Behavior: Behavior normal.     PAST MEDICAL/SURGICAL HISTORY:  Past Medical History:  Diagnosis Date   [place]atston coronary artery calcium score less than 100    51 on coronary CT 03/2021   Alpha-0- thalassemia trait/carrier 05/18/2015   2013: TCS/EGD 2017: TCS/EGD HYPERPLASTIC GASTRIC POLYPS, LYMPHOCYTIC GASTRITIS    B12 deficiency 06/16/2015   Colon polyps    Diabetes mellitus, type 2 ([place])    Hyperlipidemia    Hypertension    Microcytic anemia 05/18/2015    2013: TCS/EGD 2017: TCS/EGD HYPERPLASTIC GASTRIC POLYPS, LYMPHOCYTIC GASTRITIS    Obesity    Past Surgical History:  Procedure Laterality Date   bilateral tubal ligation  1979   CHOLECYSTECTOMY  2001   ACUTE CHOLECYSTITIS/GALLSTONES   COLONOSCOPY  05/14/2011   Dr. Oneida Alar: sessile polyp in sigmoid colon, internal hemorrhoids, hyerplastic polyps   COLONOSCOPY N/A 05/15/2015   Procedure: COLONOSCOPY;  Surgeon: Danie Binder, MD;  [facility]: [facility];  Service: Endoscopy;  Laterality: N/A;  0830   COLONOSCOPY W/ POLYPECTOMY  NOV 2008 [facility] ANEMIA   POLYP NO RETRIEVED   COLONOSCOPY W/ POLYPECTOMY  2006 DR. SMTH   POLYP?   COLONOSCOPY WITH PROPOFOL N/A 08/07/2020   Procedure: COLONOSCOPY WITH PROPOFOL;  Surgeon: Eloise Harman, DO;  Location: [facility];  Service: Endoscopy;  Laterality: N/A;  ASA II / AM procedure   ESOPHAGOGASTRODUODENOSCOPY  05/14/2011   Dr. Oneida Alar: sessile polyps in the cardia, mild gastritis. Chronic duodenitis consistent with peptic duodenitis, chronic active H.pylori gastritis.    ESOPHAGOGASTRODUODENOSCOPY N/A 05/15/2015   Procedure: ESOPHAGOGASTRODUODENOSCOPY (EGD);  Surgeon: Danie Binder, MD;  [facility]: [facility];  Service: Endoscopy;  Laterality: N/A;   GIVENS CAPSULE STUDY N/A 06/02/2015   Procedure: GIVENS CAPSULE STUDY;  Surgeon: Danie Binder, MD;  [facility]: [facility];  Service: Endoscopy;  Laterality: N/A;  0700   POLYPECTOMY  08/07/2020   Procedure: POLYPECTOMY;  Surgeon: Eloise Harman, DO;  Location: [facility];  Service: Endoscopy;;   TUBAL LIGATION     UPPER GASTROINTESTINAL ENDOSCOPY  NOV 2008 [facility] ANEMIA   NL EXAM, urease neg    SOCIAL HISTORY:  Social History   Socioeconomic History   Marital status: Married    Spouse name: Not on file   Number of children: Not on file   Years of education: Not on file   Highest education level: Not on file  Occupational History   Not on file  Tobacco Use   Smoking status: Never    Smokeless tobacco: Never  Vaping Use   Vaping Use: Never used  Substance and Sexual Activity   Alcohol use: No   Drug use: No   Sexual activity: Not Currently  Other Topics Concern   Not on file  Social History Narrative   Not on file   Social Determinants of Health   Financial Resource Strain: Low Risk  (12/28/2020)   Overall Financial Resource Strain (CARDIA)    Difficulty of Paying Living Expenses: Not hard at all  Food Insecurity: No Food Insecurity (12/28/2020)   Hunger Vital  Sign    Worried About Charity fundraiser in the Last Year: Never true    [place] in the Last Year: Never true  Transportation Needs: No Transportation Needs (12/28/2020)   PRAPARE - Hydrologist (Medical): No    Lack of Transportation (Non-Medical): No  Physical Activity: Insufficiently Active (12/28/2019)   Exercise Vital Sign    Days of Exercise per Week: 3 days    Minutes of Exercise per Session: 30 min  Stress: No Stress Concern Present (12/28/2020)   [place]    Feeling of Stress : Not at all  Social Connections: Moderately Isolated (12/28/2020)   Social Connection and Isolation Panel [NHANES]    Frequency of Communication with Friends and Family: More than three times a week    Frequency of Social Gatherings with Friends and Family: More than three times a week    Attends Religious Services: More than 4 times per year    Active Member of [employer] or Organizations: No    Attends Archivist Meetings: Never    Marital Status: Widowed  Intimate Partner Violence: Not At Risk (12/28/2019)   Humiliation, Afraid, Rape, and Kick questionnaire    Fear of Current or Ex-Partner: No    Emotionally Abused: No    Physically Abused: No    Sexually Abused: No    FAMILY HISTORY:  Family History  Problem Relation Age of Onset   Heart disease Mother    Diabetes Mother    Hypertension Mother    Stroke  Mother    Breast cancer Mother    Diabetes Father    Heart attack Father    Kidney disease Father    Hypertension Sister    Diabetes Sister    Diabetes Sister    Mental illness Sister    Hypertension Sister    Hepatitis C Sister    Mental illness Sister    Diabetes Sister    Hypertension Sister    Mental illness Sister    Colon cancer Sister 47   Diabetes Brother     CURRENT MEDICATIONS:  Outpatient Encounter Medications as of 05/23/2022  Medication Sig   Alcohol Swabs (DROPSAFE ALCOHOL PREP) 70 % PADS USE AS DIRECTED THREE TIMES DAILY   aspirin 81 MG tablet Take 81 mg by mouth daily.   Blood Glucose Calibration (TRUE METRIX LEVEL 1) Low SOLN USE AS DIRECTED AS NEEDED   blood glucose meter kit and supplies Dispense based on patient and insurance preference. Once daily testing DX E11.9   Cholecalciferol (VITAMIN D3) 2000 units TABS Take 2,000 Units by mouth daily.   Continuous Blood Gluc Receiver (DEXCOM G7 RECEIVER) DEVI Use to monitor blood sugar daily.   Continuous Blood Gluc Sensor (DEXCOM G7 SENSOR) MISC Use to monitor blood sugar daily.   DROPLET PEN NEEDLES 31G X 8 MM MISC USE EVERY DAY AS DIRECTED.   Dulaglutide (TRULICITY) 3 DJ/2.4QA SOPN Inject 3 mg as directed once a week.   ezetimibe (ZETIA) 10 MG tablet TAKE 1 TABLET (10 MG TOTAL) BY MOUTH DAILY.   ferrous sulfate 325 (65 FE) MG tablet Take 325 mg by mouth daily with breakfast.   folic acid (FOLVITE) 834 MCG tablet Take 800 mcg by mouth daily.   glipiZIDE (GLUCOTROL) 10 MG tablet TAKE 1 TABLET TWICE DAILY BEFORE MEALS   glucose blood test strip Use as instructed   Lancets 30G MISC Once daily testing  dx e11.9   LANTUS SOLOSTAR 100 UNIT/ML Solostar Pen INJECT 50 UNITS SUBCUTANEOUSLY ONCE DAILY AT  10:00  PM   lisinopril-hydrochlorothiazide (ZESTORETIC) 20-12.5 MG tablet TAKE 2 TABLETS ONE TIME DAILY FOR BLOOD PRESSURE (DOSE INCREASE)   rosuvastatin (CRESTOR) 40 MG tablet TAKE 1 TABLET (40 MG TOTAL) BY MOUTH DAILY.    TRUE METRIX BLOOD GLUCOSE TEST test strip TEST BLOOD SUGAR THREE TIMES DAILY   TRUEplus Lancets 33G MISC USE TO TEST BLOOD SUGAR THREE TIMES DAILY   No facility-administered encounter medications on file as of 05/23/2022.    ALLERGIES:  Allergies  Allergen Reactions   Tetanus Toxoids     Can't walk the next day(happened twice)    Penicillins Rash    Has patient had a PCN reaction causing immediate rash, facial/tongue/throat swelling, SOB or lightheadedness with hypotension: Yes/No:[id number] Has patient had a PCN reaction causing severe rash involving mucus membranes or skin necrosis: Yes/No:[id number] rash Has patient had a PCN reaction that required hospitalization Yes/No:[id number] Has patient had a PCN reaction occurring within the last 10 years: Yes/No:[id number] If all of the above answers are NO, then may proceed with Cephalos    LABORATORY DATA:  I have reviewed the labs as listed.  CBC    Component Value Date/Time   WBC 5.8 05/10/2022 1103   RBC 4.82 05/10/2022 1103   HGB 10.4 (L) 05/10/2022 1103   HGB 11.0 (L) 08/07/2021 1041   HCT 34.7 (L) 05/10/2022 1103   HCT 36.4 08/07/2021 1041   PLT 263 05/10/2022 1103   PLT 318 08/07/2021 1041   MCV 72.0 (L) 05/10/2022 1103   MCV 72 (L) 08/07/2021 1041   MCH 21.6 (L) 05/10/2022 1103   MCHC 30.0 05/10/2022 1103   RDW 17.0 (H) 05/10/2022 1103   RDW 15.9 (H) 08/07/2021 1041   LYMPHSABS 1.2 05/10/2022 1103   LYMPHSABS 2.4 08/14/2020 0914   MONOABS 0.5 05/10/2022 1103   EOSABS 0.4 05/10/2022 1103   EOSABS 0.4 08/14/2020 0914   BASOSABS 0.0 05/10/2022 1103   BASOSABS 0.1 08/14/2020 0914      Latest Ref Rng & Units 02/12/2022   11:30 AM 08/07/2021   10:41 AM 06/05/2021    8:07 AM  CMP  Glucose 70 - 99 mg/dL 122  181  217   BUN 8 - 27 mg/dL '27  18  19   '$ Creatinine 0.57 - 1.00 mg/dL 1.55  1.43  1.40   Sodium 134 - 144 mmol/L 138  139  141   Potassium 3.5 - 5.2 mmol/L 4.1  4.7  4.5   Chloride 96 - 106 mmol/L 98  99   102   CO2 20 - 29 mmol/L '23  25  22   '$ Calcium 8.7 - 10.3 mg/dL 9.1  9.3  9.4   Total Protein 6.0 - 8.5 g/dL 7.1  7.1    Total Bilirubin 0.0 - 1.2 mg/dL 0.4  0.4    Alkaline Phos 44 - 121 IU/L 147  142    AST 0 - 40 IU/L 26  25    ALT 0 - 32 IU/L 27  25      DIAGNOSTIC IMAGING:  I have independently reviewed the relevant imaging and discussed with the patient.   WRAP UP:  All questions were answered. The patient knows to call the clinic with any problems, questions or concerns.  Medical decision making: Moderate  Time spent on visit: I spent 20 minutes counseling the patient face to face. The total time spent in  the appointment was 30 minutes and more than 50% was on counseling.  Harriett Rush, PA-C  05/23/22 3:06 PM

## 2022-05-23 NOTE — Patient Instructions (Signed)
[place] at [place] **   You were seen today by Tarri Abernethy PA-C for your follow-up visit.    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  You do not need any IV iron or additional treatment of your anemia at this time.  VITAMIN & MINERAL DEFICIENCIES Your vitamin D, iron, vitamin B12, and folate levels all look great! Continue to take your vitamin and mineral supplements as previously recommended, EXCEPT for your iron pill, which you should now take every other day instead of every day.  ABNORMAL IMMUNOGLOBULIN PROTEIN Your kidney doctor noticed some very mild elevation in abnormal immunoglobulin protein.  This can be seen commonly in patients with chronic kidney disease, but we will check additional labs today to make sure you do not have any sign of other blood or immunoglobulin protein disorders.  We will see you for office visit within the next few weeks to discuss these results.  FOLLOW-UP APPOINTMENT: Office visit in 3 to 4 weeks  ** Thank you for trusting me with your healthcare!  I strive to provide all of my patients with quality care at each visit.  If you receive a survey for this visit, I would be so grateful to you for taking the time to provide feedback.  Thank you in advance!  ~ Rashed Edler                   Dr. Derek Jack   &   Tarri Abernethy, PA-C   - - - - - - - - - - - - - - - - - -    Thank you for choosing [place] at [facility] to provide your oncology and hematology care.  To afford each patient quality time with our provider, please arrive at least 15 minutes before your scheduled appointment time.   If you have a lab appointment with the [facility] please come in thru the Main Entrance and check in at the main information desk.  You need to re-schedule your appointment should you arrive 10 or more minutes late.   We strive to give you quality time with our providers, and arriving late affects you and other patients whose appointments are after yours.  Also, if you no show three or more times for appointments you may be dismissed from the clinic at the providers discretion.     Again, thank you for choosing [facility].  Our hope is that these requests will decrease the amount of time that you wait before being seen by our physicians.       _____________________________________________________________  Should you have questions after your visit to [facility], please contact our office at [phone] and follow the prompts.  Our office hours are 8:00 a.m. and 4:30 p.m. Monday - Friday.  Please note that voicemails left after 4:00 p.m. may not be returned until the following business day.  We are closed weekends and major holidays.  You do have access to a nurse 24-7, just call the main number to the clinic [phone] and do not press any options, hold on the line and a nurse will answer the phone.    For prescription refill requests, have your pharmacy contact our office and allow 72 hours.

## 2022-05-25 LAB — BETA 2 MICROGLOBULIN, SERUM: Beta-2 Microglobulin: 3.9 mg/L — ABNORMAL HIGH (ref 0.6–2.4)

## 2022-05-27 ENCOUNTER — Other Ambulatory Visit: Payer: Self-pay | Admitting: Family Medicine

## 2022-05-27 LAB — KAPPA/LAMBDA LIGHT CHAINS
Kappa free light chain: 87.4 mg/L — ABNORMAL HIGH (ref 3.3–19.4)
Kappa, lambda light chain ratio: 2.44 — ABNORMAL HIGH (ref 0.26–1.65)
Lambda free light chains: 35.8 mg/L — ABNORMAL HIGH (ref 5.7–26.3)

## 2022-05-29 LAB — PROTEIN ELECTROPHORESIS, SERUM
A/G Ratio: 1.1 (ref 0.7–1.7)
Albumin ELP: 3.4 g/dL (ref 2.9–4.4)
Alpha-1-Globulin: 0.2 g/dL (ref 0.0–0.4)
Alpha-2-Globulin: 0.7 g/dL (ref 0.4–1.0)
Beta Globulin: 1.2 g/dL (ref 0.7–1.3)
Gamma Globulin: 1 g/dL (ref 0.4–1.8)
Globulin, Total: 3.2 g/dL (ref 2.2–3.9)
Total Protein ELP: 6.6 g/dL (ref 6.0–8.5)

## 2022-05-30 LAB — IMMUNOFIXATION ELECTROPHORESIS
IgA: 455 mg/dL — ABNORMAL HIGH (ref 64–422)
IgG (Immunoglobin G), Serum: 1185 mg/dL (ref 586–1602)
IgM (Immunoglobulin M), Srm: 62 mg/dL (ref 26–217)
Total Protein ELP: 6.6 g/dL (ref 6.0–8.5)

## 2022-06-05 ENCOUNTER — Other Ambulatory Visit: Payer: Self-pay | Admitting: Family Medicine

## 2022-06-18 NOTE — Progress Notes (Unsigned)
[address] [place], [place] [address]  VIRTUAL VISIT via [place] at [place] connected with [name]  on 06/19/2022 at 9:15 AM by video and verified that I am speaking with the correct person using two identifiers.  Location: Patient: [place] Provider: Office at [facility]   I discussed the limitations, risks, security and privacy concerns of performing an evaluation and management service by virtual/video visit and the availability of in person appointments. I also discussed with the patient that there may be a patient responsible charge related to this service. The patient expressed understanding and agreed to proceed.  REASON FOR VISIT:  Follow-up for MGUS, alpha thalassemia minor, iron deficiency anemia, B12 deficiency  PRIOR THERAPY: IV iron (last given April 2017)  CURRENT THERAPY: Oral iron supplementation  INTERVAL HISTORY:   Ms. Teegarden 72 y.o. female returns to discuss workup of her abnormal free light chains.  She was seen for visit by Tarri Abernethy PA-C on 05/23/2022.  At today's visit, she reports feeling fairly well.  She denies any changes in her baseline health status or symptoms since her visit with Tarri Abernethy PA-C last month.  She denies any new bone pain or recent fractures. She denies any B symptoms such as fever, chills, night sweats, unintentional weight loss.  No new neurologic symptoms such as tinnitus, new-onset hearing loss, blurred vision, headache, or dizziness.  Denies any numbness or tingling in hands or feet.  No new masses or lymphadenopathy per her report.    She has 50% energy and 100% appetite. She endorses that she is maintaining a stable weight.   ASSESSMENT & PLAN:  ##  Abnormal SPEP concerning for possible MGUS - She continues to follow with Dr. Theador Hawthorne (nephrology) for chronic kidney disease.  At most recent nephrology visit on 05/13/2022, Dr. Theador Hawthorne  noted that patient had mildly elevated free light chain ratio. - Labs from Dr. Theador Hawthorne (04/15/2022): Elevated kappa light chain 66.5, elevated lambda 30.1.  Ratio mildly elevated at 2.21, difficult to interpret in the setting of CKD. SPEP showed poorly defined band of restricted protein mobility in gammaglobulin region, but no quantifiable M spike. 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 Urine immunofixation showed polyclonal light chains, negative for monoclonal immunoglobulin or free light chain - Hematology workup (05/23/2022): Immunofixation shows polyclonal increase in immunoglobulins SPEP negative Mildly elevated beta-2 microglobulin 3.9 in the setting of CKD.  LDH normal. Elevated kappa free light chain 87.4, elevated lambda free light chain 35.8, mildly elevated kappa lambda ratio 2.44 (difficult to interpret in the setting of CKD) - No new onset bone pain or neurologic changes.  No B symptoms. - We discussed spectrum of plasma cell dyscrasia ranging from MGUS to multiple myeloma, discussed with patient that further testing is needed to confirm where she is in relation to that. - PLAN: Polyclonal increase in immunoglobulins can be seen in variety of chronic pro inflammatory states.  No evidence of monoclonal protein or plasma cell dyscrasia at this time. - Will plan on repeating MGUS/myeloma panel in 1 year to rule out evolving plasma cell dyscrasia.  ----------------- OTHER ISSUES ADDRESSED AT LAST VISIT (05/23/2022) ----------------- Microcytic anemia with alpha thalassemia minor - Longstanding microcytic anemia dating back to before 2008, alpha thalassemia genotype study done on 08/17/2015 - Ongoing mild (Hgb > 10.0) microcytic anemia attributable to alpha thalassemia trait - She has previously required IV Feraheme (last given April 2017),  currently on daily oral iron supplement  - Most recent colonoscopy (08/07/2020): Nonbleeding internal hemorrhoids,  diverticulosis, polyp x1 - EGD (05/15/2015): Gastric polyps, GE junction stricture - Capsule study (06/02/2015): Occasional erosion in the duodenum.  No masses, ulcers, or AVMs seen.  Occasional lymphangiectasia. - SPEP checked in 2017 was normal.  Hemoccult stool checked in 2012 was negative. - Her baseline hemoglobin ranges from 9.5-11.0 - Most recent labs (05/10/2022): Hgb at baseline 10.4/MCV 72.0, ferritin 276, iron saturation 15% - No bright red blood per rectum or melena - Chronic fatigue is at baseline - She also has CKD stage IIIb - PLAN: No indication for IV iron or ESA at this time.   - Recommend DECREASING iron supplement to EVERY OTHER DAY - Repeat labs and RTC in 6 months.  Vitamin B12 deficiency - Patient had been receiving monthly B12 injections since February 2017 - Labs in November 2021 showed vitamin B12 level > 7500, therefore injections were held - She was placed on oral B12 tablets in June 2022   - Most recent B12 level (05/10/2022) is 675, normal methylmalonic acid - PLAN: Continue oral B12 supplementation.  Check B12/MMA at follow-up visit in 6 months.   Folate deficiency - Labs on 10/03/2020 showed folate decreased at 5.4 - She was started on folic acid supplementation in June 2022 - Most recent folic acid is normal at 32.3 - PLAN: Continue daily folic acid supplementation.  We will recheck levels at follow-up visit in 6 months.   Vitamin D deficiency - She takes vitamin D 2,000 units daily - Most recent vitamin D (05/10/2022) is normal at 41.15 - PLAN: Continue vitamin D 2000 units daily.  We will recheck levels at follow-up visit in 6 months.     PLAN SUMMARY: >> Labs in 6 months (CBC/D, CMP, ferritin, iron/TIBC, B12, MMA, vitamin D) >> OFFICE visit in 6 months (1 week after labs)      REVIEW OF SYSTEMS:   Review of Systems  Constitutional:  Positive for fatigue. Negative for appetite change, chills, diaphoresis, fever and unexpected weight change.  HENT:    Negative for lump/mass and nosebleeds.   Eyes:  Negative for eye problems.  Respiratory:  Negative for cough, hemoptysis and shortness of breath.   Cardiovascular:  Negative for chest pain, leg swelling and palpitations.  Gastrointestinal:  Negative for abdominal pain, blood in stool, constipation, diarrhea, nausea and vomiting.  Genitourinary:  Negative for hematuria.   Skin: Negative.   Neurological:  Negative for dizziness, headaches and light-headedness.  Hematological:  Does not bruise/bleed easily.     PHYSICAL EXAM: (Per limitations of video visit):   The patient is alert and oriented x 3, exhibiting adequate mentation, good mood, and ability to speak in full sentences and execute sound judgement.  NOTE: Since patient was present in clinic (provider off-site), patient's vital signs and weight were obtained by nursing staff at the time of this visit.    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 Oneida Alar: sessile polyp in sigmoid colon, internal hemorrhoids, hyerplastic polyps   COLONOSCOPY N/A 05/15/2015   Procedure: COLONOSCOPY;  Surgeon: Danie Binder, MD;  [facility];  Service: Endoscopy;  Laterality: N/A;  0830   COLONOSCOPY W/ POLYPECTOMY  NOV 2008 MJ ANEMIA   POLYP NO RETRIEVED   COLONOSCOPY W/ POLYPECTOMY  2006 DR. SMTH   POLYP?   COLONOSCOPY WITH PROPOFOL N/A 08/07/2020   Procedure: COLONOSCOPY WITH PROPOFOL;  Surgeon: Eloise Harman, DO;  [facility]: [facility];  Service: Endoscopy;  Laterality: N/A;  ASA II / AM procedure   ESOPHAGOGASTRODUODENOSCOPY  05/14/2011   Dr. Oneida Alar: sessile polyps in the cardia, mild gastritis. Chronic duodenitis consistent with peptic duodenitis, chronic active H.pylori gastritis.    ESOPHAGOGASTRODUODENOSCOPY N/A 05/15/2015   Procedure: ESOPHAGOGASTRODUODENOSCOPY (EGD);  Surgeon: Danie Binder, MD;  [facility];  Service: Endoscopy;  Laterality: N/A;   GIVENS CAPSULE STUDY N/A 06/02/2015   Procedure: GIVENS CAPSULE STUDY;  Surgeon: Danie Binder, MD;  [facility];  Service: Endoscopy;  Laterality: N/A;  0700   POLYPECTOMY  08/07/2020   Procedure: POLYPECTOMY;  Surgeon: Eloise Harman, DO;  [facility];  Service: Endoscopy;;   TUBAL LIGATION     UPPER GASTROINTESTINAL ENDOSCOPY  NOV 2008 MJ ANEMIA   NL EXAM, urease neg    SOCIAL HISTORY:  Social History   Socioeconomic History   Marital status: Married    Spouse name: Not on file   Number of children: Not on file   Years of education: Not on file   Highest education level: Not on file  Occupational History   Not on file  Tobacco Use   Smoking status: Never   Smokeless tobacco: Never  Vaping Use   Vaping Use: Never used  Substance and Sexual Activity   Alcohol use: No   Drug use: No   Sexual activity: Not Currently  Other Topics Concern   Not on file  Social History Narrative   Not on file   Social Determinants of Health   Financial Resource Strain: Low Risk  (12/28/2020)   Overall Financial Resource Strain (CARDIA)    Difficulty of Paying Living Expenses: Not hard at all   Food Insecurity: No Food Insecurity (12/28/2020)   Hunger Vital Sign    Worried About Running Out of Food in the Last Year: Never true    [place] in the Last Year: Never true  Transportation Needs: No Transportation Needs (12/28/2020)   PRAPARE - Hydrologist (Medical): No    Lack of Transportation (Non-Medical): No  Physical Activity: Insufficiently Active (12/28/2019)   Exercise Vital Sign    Days of Exercise per Week: 3 days    Minutes of Exercise per Session: 30 min  Stress: No Stress Concern Present (12/28/2020)   [place]    Feeling of Stress : Not at all  Social Connections: Moderately Isolated (12/28/2020)   Social Connection and Isolation Panel [NHANES]    Frequency of Communication with Friends and Family: More than three times a week    Frequency of Social Gatherings with Friends and Family: More than three times a week    Attends Religious Services: More than 4 times per year    Active Member of [employer] or Organizations: No    Attends Archivist Meetings: Never    Marital Status: Widowed  Intimate Partner Violence: Not At Risk (12/28/2019)   Humiliation, Afraid, Rape, and Kick questionnaire    Fear of Current or Ex-Partner: No    Emotionally Abused: No    Physically Abused: No    Sexually Abused: No    FAMILY HISTORY:  Family History  Problem Relation Age of Onset   Heart disease Mother    Diabetes Mother    Hypertension Mother    Stroke Mother    Breast cancer Mother    Diabetes Father    Heart attack Father    Kidney disease Father    Hypertension Sister    Diabetes Sister    Diabetes Sister    Mental illness Sister    Hypertension Sister    Hepatitis C Sister    Mental illness Sister    Diabetes Sister    Hypertension Sister    Mental illness Sister    Colon cancer Sister 43   Diabetes Brother     CURRENT MEDICATIONS:  Outpatient Encounter  Medications as of 06/19/2022  Medication Sig   Alcohol Swabs (DROPSAFE ALCOHOL PREP) 70 % PADS USE AS DIRECTED THREE TIMES DAILY   aspirin 81 MG tablet Take 81 mg by mouth daily.   Blood Glucose Calibration (TRUE METRIX LEVEL 1) Low SOLN USE AS DIRECTED AS NEEDED   blood glucose meter kit and supplies Dispense based on patient and insurance preference. Once daily testing DX E11.9   Cholecalciferol (VITAMIN D3) 2000 units TABS Take 2,000 Units by mouth daily.   Continuous Blood Gluc Receiver (DEXCOM G7 RECEIVER) DEVI Use to monitor blood sugar daily.   Continuous Blood Gluc Sensor (DEXCOM G7 SENSOR) MISC Use to monitor blood sugar daily.   DROPLET PEN NEEDLES 31G X 8 MM MISC USE EVERY DAY AS DIRECTED.   Dulaglutide (TRULICITY) 3 0000000 SOPN Inject 3 mg as directed once a week.   ezetimibe (ZETIA) 10 MG tablet TAKE 1 TABLET (10 MG TOTAL) BY MOUTH DAILY.   ferrous sulfate 325 (65 FE) MG tablet Take 325 mg by mouth daily with breakfast.   folic acid (FOLVITE) Q000111Q MCG tablet Take 800 mcg by mouth daily.   glipiZIDE (GLUCOTROL) 10 MG tablet TAKE 1 TABLET TWICE DAILY BEFORE MEALS   glucose blood test strip Use as instructed   Lancets 30G MISC Once daily testing dx e11.9   LANTUS SOLOSTAR 100 UNIT/ML Solostar Pen INJECT 50 UNITS SUBCUTANEOUSLY ONCE DAILY AT  10:00  PM   lisinopril-hydrochlorothiazide (ZESTORETIC) 20-12.5 MG tablet TAKE 2 TABLETS ONE TIME DAILY FOR BLOOD PRESSURE (DOSE INCREASE)   rosuvastatin (CRESTOR) 40 MG tablet TAKE 1 TABLET (40 MG TOTAL) BY MOUTH DAILY.   TRUE METRIX BLOOD GLUCOSE TEST test strip TEST BLOOD SUGAR THREE TIMES DAILY   TRUEplus Lancets 33G MISC USE TO TEST BLOOD SUGAR THREE TIMES DAILY   No facility-administered encounter medications on file as of 06/19/2022.    ALLERGIES:  Allergies  Allergen Reactions   Tetanus Toxoids     Can't walk the next day(happened  twice)    Penicillins Rash    Has patient had a PCN reaction causing immediate rash,  facial/tongue/throat swelling, SOB or lightheadedness with hypotension: Yes/No:[id number] Has patient had a PCN reaction causing severe rash involving mucus membranes or skin necrosis: Yes/No:[id number] rash Has patient had a PCN reaction that required hospitalization Yes/No:[id number] Has patient had a PCN reaction occurring within the last 10 years: Yes/No:[id number] If all of the above answers are NO, then may proceed with [place]    LABORATORY DATA:  I have reviewed the labs as listed.  CBC    Component Value Date/Time   WBC 5.8 05/10/2022 1103   RBC 4.82 05/10/2022 1103   HGB 10.4 (L) 05/10/2022 1103   HGB 11.0 (L) 08/07/2021 1041   HCT 34.7 (L) 05/10/2022 1103   HCT 36.4 08/07/2021 1041   PLT 263 05/10/2022 1103   PLT 318 08/07/2021 1041   MCV 72.0 (L) 05/10/2022 1103   MCV 72 (L) 08/07/2021 1041   MCH 21.6 (L) 05/10/2022 1103   MCHC 30.0 05/10/2022 1103   RDW 17.0 (H) 05/10/2022 1103   RDW 15.9 (H) 08/07/2021 1041   LYMPHSABS 1.2 05/10/2022 1103   LYMPHSABS 2.4 08/14/2020 0914   MONOABS 0.5 05/10/2022 1103   EOSABS 0.4 05/10/2022 1103   EOSABS 0.4 08/14/2020 0914   BASOSABS 0.0 05/10/2022 1103   BASOSABS 0.1 08/14/2020 0914      Latest Ref Rng & Units 02/12/2022   11:30 AM 08/07/2021   10:41 AM 06/05/2021    8:07 AM  CMP  Glucose 70 - 99 mg/dL 122  181  217   BUN 8 - 27 mg/dL 27  18  19   $ Creatinine 0.57 - 1.00 mg/dL 1.55  1.43  1.40   Sodium 134 - 144 mmol/L 138  139  141   Potassium 3.5 - 5.2 mmol/L 4.1  4.7  4.5   Chloride 96 - 106 mmol/L 98  99  102   CO2 20 - 29 mmol/L 23  25  22   $ Calcium 8.7 - 10.3 mg/dL 9.1  9.3  9.4   Total Protein 6.0 - 8.5 g/dL 7.1  7.1    Total Bilirubin 0.0 - 1.2 mg/dL 0.4  0.4    Alkaline Phos 44 - 121 IU/L 147  142    AST 0 - 40 IU/L 26  25    ALT 0 - 32 IU/L 27  25      DIAGNOSTIC IMAGING:  I have independently reviewed the relevant imaging and discussed with the patient.   WRAP UP:  I discussed the assessment and  treatment plan with the patient. The patient was provided an opportunity to ask questions and all were answered. The patient agreed with the plan and demonstrated an understanding of the instructions.   The patient was advised to call or seek an in-person evaluation if the symptoms worsen or if the condition fails to improve as anticipated.  Medical decision making: Low  Time spent on visit: I spent 22 minutes in preparation for visit and in counseling patient via virtual video visit.  Harriett Rush, PA-C 06/19/2022 9:43 AM

## 2022-06-19 ENCOUNTER — Inpatient Hospital Stay: Payer: Medicare HMO | Attending: Hematology | Admitting: Physician Assistant

## 2022-06-19 ENCOUNTER — Encounter: Payer: Self-pay | Admitting: Nutrition

## 2022-06-19 ENCOUNTER — Encounter: Payer: Self-pay | Admitting: Physician Assistant

## 2022-06-19 ENCOUNTER — Other Ambulatory Visit: Payer: Self-pay

## 2022-06-19 ENCOUNTER — Encounter: Payer: Medicare HMO | Attending: Family Medicine | Admitting: Nutrition

## 2022-06-19 VITALS — BP 98/58 | HR 89 | Temp 97.6°F | Resp 16 | Wt 234.0 lb

## 2022-06-19 VITALS — Ht 66.0 in | Wt 238.0 lb

## 2022-06-19 DIAGNOSIS — D509 Iron deficiency anemia, unspecified: Secondary | ICD-10-CM

## 2022-06-19 DIAGNOSIS — D563 Thalassemia minor: Secondary | ICD-10-CM

## 2022-06-19 DIAGNOSIS — E559 Vitamin D deficiency, unspecified: Secondary | ICD-10-CM

## 2022-06-19 DIAGNOSIS — R778 Other specified abnormalities of plasma proteins: Secondary | ICD-10-CM

## 2022-06-19 DIAGNOSIS — I10 Essential (primary) hypertension: Secondary | ICD-10-CM | POA: Diagnosis not present

## 2022-06-19 DIAGNOSIS — D508 Other iron deficiency anemias: Secondary | ICD-10-CM

## 2022-06-19 DIAGNOSIS — E785 Hyperlipidemia, unspecified: Secondary | ICD-10-CM | POA: Diagnosis not present

## 2022-06-19 DIAGNOSIS — E1159 Type 2 diabetes mellitus with other circulatory complications: Secondary | ICD-10-CM | POA: Diagnosis not present

## 2022-06-19 DIAGNOSIS — E538 Deficiency of other specified B group vitamins: Secondary | ICD-10-CM

## 2022-06-19 NOTE — Patient Instructions (Signed)
[place] at [place] **   You were seen today by Tarri Abernethy PA-C for your follow-up visit.    ABNORMAL PROTEIN: Your kidney doctor had noticed some trace amounts of abnormal protein in your lab work and asked [place] to check additional tests.  We did NOT find any major abnormal protein in your labs, only some increased protein levels associated with inflammation and chronic kidney disease.  We will continue to monitor these protein labs in about a year.  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  You do not need any IV iron or additional treatment of your anemia at this time.  VITAMIN & MINERAL DEFICIENCIES Your vitamin D, iron, vitamin B12, and folate levels all look great! Continue to take your vitamin and mineral supplements as previously recommended, EXCEPT for your iron pill, which you should now take every other day instead of every day.  FOLLOW-UP APPOINTMENT: Labs and office visit in 6 months  ** Thank you for trusting me with your healthcare!  I strive to provide all of my patients with quality care at each visit.  If you receive a survey for this visit, I would be so grateful to you for taking the time to provide feedback.  Thank you in advance!  ~ Saralyn Willison                   Dr. Derek Jack   &   Tarri Abernethy, PA-C   - - - - - - - - - - - - - - - - - -    Thank you for choosing [place] at [facility] to provide your oncology and hematology care.  To afford each patient quality time with our provider, please arrive at least 15 minutes before your scheduled appointment time.   If you have a lab appointment with the [place] please come in thru the Main Entrance and check in at the main information desk.  You need to re-schedule your appointment should you arrive 10 or more minutes late.  We strive to give you quality time  with our providers, and arriving late affects you and other patients whose appointments are after yours.  Also, if you no show three or more times for appointments you may be dismissed from the clinic at the providers discretion.     Again, thank you for choosing [facility].  Our hope is that these requests will decrease the amount of time that you wait before being seen by our physicians.       _____________________________________________________________  Should you have questions after your visit to [facility], please contact our office at [phone] and follow the prompts.  Our office hours are 8:00 a.m. and 4:30 p.m. Monday - Friday.  Please note that voicemails left after 4:00 p.m. may not be returned until the following business day.  We are closed weekends and major holidays.  You do have access to a nurse 24-7, just call the main number to the clinic [phone] and do not press any options, hold on the line and a nurse will answer the phone.    For prescription refill requests, have your pharmacy contact our office and allow 72 hours.

## 2022-06-19 NOTE — Progress Notes (Unsigned)
Medical Nutrition Therapy  Appointment Start time:  1300  Appointment End time:  1400  Primary concerns today: I=Obesity and DM  Referral diagnosis: E11.8, D66.9 Preferred learning style: No preference.  Learning readiness: Change in progress    NUTRITION ASSESSMENT  Has cut out cakes, sweets. PCP Dr. Moshe [facility]  ***   Clinical Medical Hx: *** Medications: *** Labs: *** Notable Signs/Symptoms: ***  Lifestyle & Dietary Hx ***  Estimated daily fluid intake: *** oz Supplements: *** Sleep: *** Stress / self-care: *** Current average weekly physical activity: ***  24-Hr Dietary Recall First Meal: Sausage, egg and cheese biscuit, Water Snack:  Second Meal:Skipped. Snack:  Third Meal:  BBQ Ribs, mushrooms, Zero Coke Snack: nabs Beverages: Diet sodas, water,  Estimated Energy Needs Calories: *** Carbohydrate: ***g Protein: ***g Fat: ***g   NUTRITION DIAGNOSIS  {CHL AMB NUTRITIONAL DIAGNOSIS:[id number]}   NUTRITION INTERVENTION  Nutrition education (E-1) on the following topics:  ***  Handouts Provided Include  ***  Learning Style & Readiness for Change Teaching method utilized: Visual & Auditory  Demonstrated degree of understanding via: Teach Back  Barriers to learning/adherence to lifestyle change: ***  Goals Established by Pt ***   MONITORING & EVALUATION Dietary intake, weekly physical activity, and *** in ***.  Next Steps  Patient is to ***.

## 2022-06-19 NOTE — Patient Instructions (Signed)
Goals  Use egg whites only Cut out diet soda. Drink more water Increase fruits and vegetables Avoid fast foods. Walk in the house 15 minutes a day   B) Egg whites  and whole wheat toast and piece of fruit, water L) PInto beans and salad, fruit, water D) Bake chicken, [place] green, water   Goals for blood sugars  80-130 in am 100-150 at bedtime.  B) 6-8 L) 12-2 D) 5-7.

## 2022-06-27 ENCOUNTER — Encounter: Payer: Self-pay | Admitting: Radiology

## 2022-07-05 ENCOUNTER — Other Ambulatory Visit: Payer: Self-pay | Admitting: Family Medicine

## 2022-07-10 ENCOUNTER — Other Ambulatory Visit: Payer: Self-pay | Admitting: Family Medicine

## 2022-07-11 ENCOUNTER — Ambulatory Visit (INDEPENDENT_AMBULATORY_CARE_PROVIDER_SITE_OTHER): Payer: Medicare HMO | Admitting: Family Medicine

## 2022-07-11 ENCOUNTER — Encounter: Payer: Self-pay | Admitting: Family Medicine

## 2022-07-11 VITALS — BP 113/71 | HR 91 | Ht 66.0 in | Wt 235.1 lb

## 2022-07-11 DIAGNOSIS — E1159 Type 2 diabetes mellitus with other circulatory complications: Secondary | ICD-10-CM

## 2022-07-11 DIAGNOSIS — E785 Hyperlipidemia, unspecified: Secondary | ICD-10-CM | POA: Diagnosis not present

## 2022-07-11 DIAGNOSIS — I10 Essential (primary) hypertension: Secondary | ICD-10-CM

## 2022-07-11 NOTE — Patient Instructions (Addendum)
F/U in 6 to 8  weeks with blood sugar log, test 3 time daily call if you need me sooner   Starting today take glipizide 10 mg ONCE daily, only in the morning since youi report low sugars  HBA1C, chem 7 and EGFr today  Please get shingrix #2 as soon as possible   It is important that you exercise regularly at least 30 minutes 5 times a week. If you develop chest pain, have severe difficulty breathing, or feel very tired, stop exercising immediately and seek medical attention   Think about what you will eat, plan ahead. Choose " clean, green, fresh or frozen" over canned, processed or packaged foods which are more sugary, salty and fatty. 70 to 75% of food eaten should be vegetables and fruit. Three meals at set times with snacks allowed between meals, but they must be fruit or vegetables. Aim to eat over a 12 hour period , example 7 am to 7 pm, and STOP after  your last meal of the day. Drink water,generally about 64 ounces per day, no other drink is as healthy. Fruit juice is best enjoyed in a healthy way, by EATING the fruit. Thanks for choosing [facility], we consider it a privelige to serve you.

## 2022-07-12 LAB — BMP8+EGFR
BUN/Creatinine Ratio: 24 (ref 12–28)
BUN: 36 mg/dL — ABNORMAL HIGH (ref 8–27)
CO2: 20 mmol/L (ref 20–29)
Calcium: 9.3 mg/dL (ref 8.7–10.3)
Chloride: 99 mmol/L (ref 96–106)
Creatinine, Ser: 1.48 mg/dL — ABNORMAL HIGH (ref 0.57–1.00)
Glucose: 177 mg/dL — ABNORMAL HIGH (ref 70–99)
Potassium: 4.9 mmol/L (ref 3.5–5.2)
Sodium: 137 mmol/L (ref 134–144)
eGFR: 37 mL/min/{1.73_m2} — ABNORMAL LOW (ref >59)

## 2022-07-12 LAB — HEMOGLOBIN A1C
Est. average glucose Bld gHb Est-mCnc: 169 mg/dL
Hgb A1c MFr Bld: 7.5 % — ABNORMAL HIGH (ref 4.8–5.6)

## 2022-07-14 ENCOUNTER — Encounter: Payer: Self-pay | Admitting: Family Medicine

## 2022-07-14 NOTE — Assessment & Plan Note (Signed)
Hyperlipidemia:Low fat diet discussed and encouraged.   Lipid Panel  Lab Results  Component Value Date   CHOL 120 02/12/2022   HDL 53 02/12/2022   LDLCALC 50 02/12/2022   TRIG 87 02/12/2022   CHOLHDL 2.3 02/12/2022     Controlled, no change in medication  

## 2022-07-14 NOTE — Progress Notes (Signed)
Helen Mahoney     MRN: [id number]      DOB: Nov 18, 1950   HPI [name] is here for follow up and re-evaluation of chronic medical conditions, medication management and review of any available recent lab and radiology data.  Preventive health is updated, specifically  Cancer screening and Immunization.   Questions or concerns regarding consultations or procedures which the PT has had in the interim are  addressed. The PT denies any adverse reactions to current medications since the last visit.  There are no new concerns.  C/o low blood sugar readings in the early morning, since changing food choice and taking higher dose trulicity  Still no exercise and no plan to start ROS Denies recent fever or chills. Denies sinus pressure, nasal congestion, ear pain or sore throat. Denies chest congestion, productive cough or wheezing. Denies chest pains, palpitations and leg swelling Denies abdominal pain, nausea, vomiting,diarrhea or constipation.   Denies dysuria, frequency, hesitancy or incontinence. Denies joint pain, swelling and limitation in mobility. Denies headaches, seizures, numbness, or tingling. Denies depression, anxiety or insomnia. Denies skin break down or rash.   PE  BP 113/71 (BP Location: Right Arm, Patient Position: Sitting, Cuff Size: Large)   Pulse 91   Ht 5\' 6"  (1.676 m)   Wt 235 lb 1.3 oz (106.6 kg)   SpO2 95%   BMI 37.94 kg/m   Patient alert and oriented and in no cardiopulmonary distress.  HEENT: No facial asymmetry, EOMI,     Neck supple .  Chest: Clear to auscultation bilaterally.  CVS: S1, S2 no murmurs, no S3.Regular rate.  ABD: Soft non tender.   Ext: No edema  MS: Adequate ROM spine, shoulders, hips and knees.  Skin: Intact, no ulcerations or rash noted.  Psych: Good eye contact, normal affect. Memory intact not anxious or depressed appearing.  CNS: CN 2-12 intact, power,  normal throughout.no focal deficits noted.   Assessment &  Plan  Essential hypertension Controlled, no change in medication DASH diet and commitment to daily physical activity for a minimum of 30 minutes discussed and encouraged, as a part of hypertension management. The importance of attaining a healthy weight is also discussed.     07/11/2022   10:21 AM 06/19/2022    1:02 PM 06/19/2022    8:20 AM 05/23/2022    2:35 PM 05/17/2022   11:54 AM 05/17/2022   11:18 AM 03/06/2022    9:29 AM  BP/Weight  Systolic BP 123456  98 123456 [id number]  Diastolic BP 71  58 67 72 74 57  Wt. (Lbs) 235.08 238 234 233  242 239  BMI 37.94 kg/m2 38.41 kg/m2 37.77 kg/m2 37.61 kg/m2  39.06 kg/m2 38.58 kg/m2       Type 2 diabetes mellitus with vascular disease (HCC) Improved, reduce glipizide dose and re eval in 6 weks with log Helen Mahoney is reminded of the importance of commitment to daily physical activity for 30 minutes or more, as able and the need to limit carbohydrate intake to 30 to 60 grams per meal to help with blood sugar control.   The need to take medication as prescribed, test blood sugar as directed, and to call between visits if there is a concern that blood sugar is uncontrolled is also discussed.   Helen Mahoney is reminded of the importance of daily foot exam, annual eye examination, and good blood sugar, blood pressure and cholesterol control.     Latest Ref Rng & Units 07/11/2022  11:02 AM 02/12/2022   11:30 AM 08/07/2021   10:41 AM 08/01/2021    8:49 AM 06/05/2021    8:07 AM  Diabetic Labs  HbA1c 4.8 - 5.6 % 7.5  10.2  10.0     Micro/Creat Ratio 0 - 29 mg/g creat  6      Chol 100 - 199 mg/dL  120   101    HDL >39 mg/dL  53   47    Calc LDL 0 - 99 mg/dL  50   42    Triglycerides 0 - 149 mg/dL  87   51    Creatinine 0.57 - 1.00 mg/dL 1.48  1.55  1.43   1.40       07/11/2022   10:21 AM 06/19/2022    1:02 PM 06/19/2022    8:20 AM 05/23/2022    2:35 PM 05/17/2022   11:54 AM 05/17/2022   11:18 AM 03/06/2022    9:29 AM  BP/Weight  Systolic BP 123456  98  [id number]  Diastolic BP 71  58 67 72 74 57  Wt. (Lbs) 235.08 238 234 233  242 239  BMI 37.94 kg/m2 38.41 kg/m2 37.77 kg/m2 37.61 kg/m2  39.06 kg/m2 38.58 kg/m2      Latest Ref Rng & Units 02/21/2022   12:00 AM 02/12/2022   10:00 AM  Foot/eye exam completion dates  Eye Exam No Retinopathy No Retinopathy       Foot Form Completion   Done     This result is from an external source.        Morbid obesity  Patient re-educated about  the importance of commitment to a  minimum of 150 minutes of exercise per week as able.  The importance of healthy food choices with portion control discussed, as well as eating regularly and within a 12 hour window most days. The need to choose "clean , green" food 50 to 75% of the time is discussed, as well as to make water the primary drink and set a goal of 64 ounces water daily.       07/11/2022   10:21 AM 06/19/2022    1:02 PM 06/19/2022    8:20 AM  Weight /BMI  Weight 235 lb 1.3 oz 238 lb 234 lb  Height 5\' 6"  (1.676 m) 5\' 6"  (1.676 m)   BMI 37.94 kg/m2 38.41 kg/m2 37.77 kg/m2    Unchnaged  Hyperlipidemia LDL goal <70 Hyperlipidemia:Low fat diet discussed and encouraged.   Lipid Panel  Lab Results  Component Value Date   CHOL 120 02/12/2022   HDL 53 02/12/2022   LDLCALC 50 02/12/2022   TRIG 87 02/12/2022   CHOLHDL 2.3 02/12/2022     Controlled, no change in medication

## 2022-07-14 NOTE — Assessment & Plan Note (Signed)
  Patient re-educated about  the importance of commitment to a  minimum of 150 minutes of exercise per week as able.  The importance of healthy food choices with portion control discussed, as well as eating regularly and within a 12 hour window most days. The need to choose "clean , green" food 50 to 75% of the time is discussed, as well as to make water the primary drink and set a goal of 64 ounces water daily.       07/11/2022   10:21 AM 06/19/2022    1:02 PM 06/19/2022    8:20 AM  Weight /BMI  Weight 235 lb 1.3 oz 238 lb 234 lb  Height 5\' 6"  (1.676 m) 5\' 6"  (1.676 m)   BMI 37.94 kg/m2 38.41 kg/m2 37.77 kg/m2    Unchnaged

## 2022-07-14 NOTE — Assessment & Plan Note (Signed)
Improved, reduce glipizide dose and re eval in 6 weks with log Helen Mahoney is reminded of the importance of commitment to daily physical activity for 30 minutes or more, as able and the need to limit carbohydrate intake to 30 to 60 grams per meal to help with blood sugar control.   The need to take medication as prescribed, test blood sugar as directed, and to call between visits if there is a concern that blood sugar is uncontrolled is also discussed.   Helen Mahoney is reminded of the importance of daily foot exam, annual eye examination, and good blood sugar, blood pressure and cholesterol control.     Latest Ref Rng & Units 07/11/2022   11:02 AM 02/12/2022   11:30 AM 08/07/2021   10:41 AM 08/01/2021    8:49 AM 06/05/2021    8:07 AM  Diabetic Labs  HbA1c 4.8 - 5.6 % 7.5  10.2  10.0     Micro/Creat Ratio 0 - 29 mg/g creat  6      Chol 100 - 199 mg/dL  120   101    HDL >39 mg/dL  53   47    Calc LDL 0 - 99 mg/dL  50   42    Triglycerides 0 - 149 mg/dL  87   51    Creatinine 0.57 - 1.00 mg/dL 1.48  1.55  1.43   1.40       07/11/2022   10:21 AM 06/19/2022    1:02 PM 06/19/2022    8:20 AM 05/23/2022    2:35 PM 05/17/2022   11:54 AM 05/17/2022   11:18 AM 03/06/2022    9:29 AM  BP/Weight  Systolic BP 123456  98 123456 [id number]  Diastolic BP 71  58 67 72 74 57  Wt. (Lbs) 235.08 238 234 233  242 239  BMI 37.94 kg/m2 38.41 kg/m2 37.77 kg/m2 37.61 kg/m2  39.06 kg/m2 38.58 kg/m2      Latest Ref Rng & Units 02/21/2022   12:00 AM 02/12/2022   10:00 AM  Foot/eye exam completion dates  Eye Exam No Retinopathy No Retinopathy       Foot Form Completion   Done     This result is from an external source.

## 2022-07-14 NOTE — Assessment & Plan Note (Signed)
Controlled, no change in medication DASH diet and commitment to daily physical activity for a minimum of 30 minutes discussed and encouraged, as a part of hypertension management. The importance of attaining a healthy weight is also discussed.     07/11/2022   10:21 AM 06/19/2022    1:02 PM 06/19/2022    8:20 AM 05/23/2022    2:35 PM 05/17/2022   11:54 AM 05/17/2022   11:18 AM 03/06/2022    9:29 AM  BP/Weight  Systolic BP 123456  98 123456 [id number]  Diastolic BP 71  58 67 72 74 57  Wt. (Lbs) 235.08 238 234 233  242 239  BMI 37.94 kg/m2 38.41 kg/m2 37.77 kg/m2 37.61 kg/m2  39.06 kg/m2 38.58 kg/m2

## 2022-07-23 ENCOUNTER — Encounter: Payer: Medicare HMO | Attending: Family Medicine | Admitting: Nutrition

## 2022-07-23 ENCOUNTER — Encounter: Payer: Self-pay | Admitting: Nutrition

## 2022-07-23 DIAGNOSIS — E1159 Type 2 diabetes mellitus with other circulatory complications: Secondary | ICD-10-CM | POA: Diagnosis not present

## 2022-07-23 DIAGNOSIS — E559 Vitamin D deficiency, unspecified: Secondary | ICD-10-CM | POA: Diagnosis not present

## 2022-07-23 DIAGNOSIS — I10 Essential (primary) hypertension: Secondary | ICD-10-CM

## 2022-07-23 DIAGNOSIS — E538 Deficiency of other specified B group vitamins: Secondary | ICD-10-CM | POA: Diagnosis not present

## 2022-07-23 DIAGNOSIS — E785 Hyperlipidemia, unspecified: Secondary | ICD-10-CM | POA: Diagnosis not present

## 2022-07-23 NOTE — Patient Instructions (Addendum)
Goals Keep up the great job!! Walk 15 minutes -3-4 times per week Notify MD if BS are less than 70 , 2-3 times per week during the day or in early am in the middle of the night. Eat eggs, 1/2 c oatmeal and 1 piece of fruit for breakfast Prevent low blood sugars Reduce night time insulin to 28 units if BS continue to drop n the middle of the night. Ask Dr Moshe Cipro to stop your am Glipizide since BS drop in early afternoon.

## 2022-07-23 NOTE — Progress Notes (Signed)
Medical Nutrition Therapy  Appointment Start time:  [id number]  Appointment End time:  1400  Primary concerns today: I=Obesity and DM  Referral diagnosis: E11.8, D66.9 Preferred learning style: No preference.  Learning readiness: Change in progress    NUTRITION ASSESSMENT  72 yr old bfemale here for uncontrolled DM and desired weight loss. PCP Dr. Moshe Cipro.   She has been using her Dexcom and has learned a lot about what she eats and it's impact on her blood sugars. Has changed a lot of her eating habits. A1C improved from 10.2% down to 7.5%.  Dexcom shows 79% BS are in [employer] range; 3% are low and 18% are high.  Had been on 50 units of Lantus and had to reduce it to 30 units since she kept having low blood sugars. Her BS have drastcstically improved since she cut out the sodas. Stopped Glipizide at night but still taking 1 in am. Still having some mid morning or early afternoon low blood sugars at times. May need to consider stopping the Glipizide in the am also.  She has been having to correct for low blood sugars a lot by eating snacks which is limiting her weight loss.  She is eating more fruits, vegetables and baked and broiled foods.Cut out sweets and a lot of junk food. Sees the Nephrologist in the next week or so.  Feels much better. Has a lot more energy. Last A1C was 7.5% the beginning of this month.  Lost 3 lbs.  She is willing to work on eating more whole plant based foods of fruits and vegetables and whole grains to help with her chronic health conditions.  Anthropometrics  Wt Readings from Last 3 Encounters:  07/11/22 235 lb 1.3 oz (106.6 kg)  06/19/22 238 lb (108 kg)  06/19/22 234 lb (106.1 kg)   Ht Readings from Last 3 Encounters:  07/11/22 5\' 6"  (1.676 m)  06/19/22 5\' 6"  (1.676 m)  05/17/22 5\' 6"  (1.676 m)   There is no height or weight on file to calculate BMI. @BMIFA @ Facility age limit for growth %iles is 20 years. Facility age limit for growth %iles is 20  years.    Clinical Medical Hx: See chart Medications: Lantus 50 units at night, Glipizide Labs:  Lab Results  Component Value Date   HGBA1C 7.5 (H) 07/11/2022       Latest Ref Rng & Units 07/11/2022   11:02 AM 02/12/2022   11:30 AM 08/07/2021   10:41 AM  CMP  Glucose 70 - 99 mg/dL 177  122  181   BUN 8 - 27 mg/dL 36  27  18   Creatinine 0.57 - 1.00 mg/dL 1.48  1.55  1.43   Sodium 134 - 144 mmol/L 137  138  139   Potassium 3.5 - 5.2 mmol/L 4.9  4.1  4.7   Chloride 96 - 106 mmol/L 99  98  99   CO2 20 - 29 mmol/L 20  23  25    Calcium 8.7 - 10.3 mg/dL 9.3  9.1  9.3   Total Protein 6.0 - 8.5 g/dL  7.1  7.1   Total Bilirubin 0.0 - 1.2 mg/dL  0.4  0.4   Alkaline Phos 44 - 121 IU/L  147  142   AST 0 - 40 IU/L  26  25   ALT 0 - 32 IU/L  27  25    Lipid Panel     Component Value Date/Time   CHOL 120 02/12/2022 1130  TRIG 87 02/12/2022 1130   HDL 53 02/12/2022 1130   CHOLHDL 2.3 02/12/2022 1130   CHOLHDL 3.3 03/30/2019 0914   VLDL 29 11/04/2016 1033   LDLCALC 50 02/12/2022 1130   LDLCALC 106 (H) 03/30/2019 0914   LABVLDL 17 02/12/2022 1130    Notable Signs/Symptoms: Tired, craves sweets  Lifestyle & Dietary Hx Lives by herself. Cooks at home and eats out.  Estimated daily fluid intake: 30 oz Supplements:  Sleep: 6-7  Stress / self-care:  Current average weekly physical activity: ADL  24-Hr Dietary Recall First Meal: egg omelet and 3 pieces of fruit, Snack:  Second Meal:leftovers usually  BBQ without a bun, salad, water Snack:  Third Meal:  Chicken, greens, salad, water, fruit Snack: nabs Beverages: water  Estimated Energy Needs Calories: 1400 Carbohydrate: 158g Protein: 105g Fat: 39g   NUTRITION DIAGNOSIS  NB-1.1 Food and nutrition-related knowledge deficit As related to Diabetes Type 2.  As evidenced by A1C 10.2%.   NUTRITION INTERVENTION  Nutrition education (E-1) on the following topics:  Nutrition and Diabetes education provided on My Plate, CHO  counting, meal planning, portion sizes, timing of meals, avoiding snacks between meals unless having a low blood sugar, target ranges for A1C and blood sugars, signs/symptoms and treatment of hyper/hypoglycemia, monitoring blood sugars, taking medications as prescribed, benefits of exercising 30 minutes per day and prevention of complications of DM.  Lifestyle Medicine  - Whole Food, Plant Predominant Nutrition is highly recommended: Eat Plenty of vegetables, Mushrooms, fruits, Legumes, Whole Grains, Nuts, seeds in lieu of processed meats, processed snacks/pastries red meat, poultry, eggs.    -It is better to avoid simple carbohydrates including: Cakes, Sweet Desserts, Ice Cream, Soda (diet and regular), Sweet Tea, Candies, Chips, Cookies, Store Bought Juices, Alcohol in Excess of  1-2 drinks a day, Lemonade,  Artificial Sweeteners, Doughnuts, Coffee Creamers, "Sugar-free" Products, etc, etc.  This is not a complete list.....  Exercise: If you are able: 30 -60 minutes a day ,4 days a week, or 150 minutes a week.  The longer the better.  Combine stretch, strength, and aerobic activities.  If you were told in the past that you have high risk for cardiovascular diseases, you may seek evaluation by your heart doctor prior to initiating moderate to intense exercise programs.    Handouts Provided Include  Lifestyle Medicine Dexcom insturctions  Learning Style & Readiness for Change Teaching method utilized: Visual & Auditory  Demonstrated degree of understanding via: Teach Back  Barriers to learning/adherence to lifestyle change: None  Goals Established by Pt  Keep up the great job!! Walk 15 minutes -3-4 times per week Notify MD if BS are less than 70 , 2-3 times per week during the day or in early am in the middle of the night. Eat eggs, 1/2 c oatmeal and 1 piece of fruit for breakfast Prevent low blood sugars Reduce night time insulin to 28 units if BS continue to drop n the middle of the  night. Ask Dr Moshe Cipro to stop your am Glipizide since BS drop in early afternoon.   MONITORING & EVALUATION Dietary intake, weekly physical activity, and weight and BS in 3 month. Recommend to consider stopping GLipizide dose in am and consider reducing night time insulin to 28 units due to low blood sugars in am and middle of the night at times.  Next Steps  Patient is to work on eating more plant based foods and cut out chips and sodas.Marland Kitchen

## 2022-07-29 DIAGNOSIS — E1122 Type 2 diabetes mellitus with diabetic chronic kidney disease: Secondary | ICD-10-CM | POA: Diagnosis not present

## 2022-07-29 DIAGNOSIS — I5032 Chronic diastolic (congestive) heart failure: Secondary | ICD-10-CM | POA: Diagnosis not present

## 2022-07-29 DIAGNOSIS — D472 Monoclonal gammopathy: Secondary | ICD-10-CM | POA: Diagnosis not present

## 2022-07-29 DIAGNOSIS — D638 Anemia in other chronic diseases classified elsewhere: Secondary | ICD-10-CM | POA: Diagnosis not present

## 2022-07-29 DIAGNOSIS — E211 Secondary hyperparathyroidism, not elsewhere classified: Secondary | ICD-10-CM | POA: Diagnosis not present

## 2022-07-29 DIAGNOSIS — R768 Other specified abnormal immunological findings in serum: Secondary | ICD-10-CM | POA: Diagnosis not present

## 2022-07-29 DIAGNOSIS — R778 Other specified abnormalities of plasma proteins: Secondary | ICD-10-CM | POA: Diagnosis not present

## 2022-07-29 DIAGNOSIS — Z832 Family history of diseases of the blood and blood-forming organs and certain disorders involving the immune mechanism: Secondary | ICD-10-CM | POA: Diagnosis not present

## 2022-07-29 DIAGNOSIS — N189 Chronic kidney disease, unspecified: Secondary | ICD-10-CM | POA: Diagnosis not present

## 2022-08-13 DIAGNOSIS — D472 Monoclonal gammopathy: Secondary | ICD-10-CM | POA: Diagnosis not present

## 2022-08-13 DIAGNOSIS — D638 Anemia in other chronic diseases classified elsewhere: Secondary | ICD-10-CM | POA: Diagnosis not present

## 2022-08-13 DIAGNOSIS — E211 Secondary hyperparathyroidism, not elsewhere classified: Secondary | ICD-10-CM | POA: Diagnosis not present

## 2022-08-13 DIAGNOSIS — E1122 Type 2 diabetes mellitus with diabetic chronic kidney disease: Secondary | ICD-10-CM | POA: Diagnosis not present

## 2022-08-13 DIAGNOSIS — I5032 Chronic diastolic (congestive) heart failure: Secondary | ICD-10-CM | POA: Diagnosis not present

## 2022-08-13 DIAGNOSIS — N189 Chronic kidney disease, unspecified: Secondary | ICD-10-CM | POA: Diagnosis not present

## 2022-08-21 ENCOUNTER — Other Ambulatory Visit: Payer: Self-pay | Admitting: Family Medicine

## 2022-08-22 ENCOUNTER — Ambulatory Visit (INDEPENDENT_AMBULATORY_CARE_PROVIDER_SITE_OTHER): Payer: Medicare HMO | Admitting: Family Medicine

## 2022-08-22 ENCOUNTER — Encounter: Payer: Self-pay | Admitting: Family Medicine

## 2022-08-22 VITALS — BP 104/68 | HR 100 | Ht 66.0 in | Wt 228.1 lb

## 2022-08-22 DIAGNOSIS — E785 Hyperlipidemia, unspecified: Secondary | ICD-10-CM

## 2022-08-22 DIAGNOSIS — E1159 Type 2 diabetes mellitus with other circulatory complications: Secondary | ICD-10-CM

## 2022-08-22 DIAGNOSIS — I10 Essential (primary) hypertension: Secondary | ICD-10-CM

## 2022-08-22 NOTE — Patient Instructions (Addendum)
F/U June 18 or after, call  if you need me sooner  Reduce insulin to 25 units daily and continue to follow the healthy diet that you are currently doing  Please start an exercise program as you ar now thinking that you will  Fasting lipid, cmp and EGFR and HBA1C June 14 or shortly after  Thanks for choosing [facility], we consider it a privelige to serve you.

## 2022-08-25 ENCOUNTER — Encounter: Payer: Self-pay | Admitting: Family Medicine

## 2022-08-25 NOTE — Progress Notes (Signed)
Helen Mahoney     MRN: [id number]      DOB: January 06, 1951  Chief Complaint  Patient presents with   Follow-up    Follow up    HPI Helen Mahoney is here for follow up and re-evaluation of chronic medical conditions, medication management and review of any available recent lab and radiology data.  Preventive health is updated, specifically  Cancer screening and Immunization.   Has been to the diabetic educator, changed diet and is using CBG monitor wit marked improvement in blood sugar, even contemplating starting exercise! Denies polyuria, polydipsia, blurred vision , or hypoglycemic episodes.At times, she has to eat extrs to keep her blod sugar up  The PT denies any adverse reactions to current medications since the last visit.  There are no new concerns.  There are no specific complaints   ROS Denies recent fever or chills. Denies sinus pressure, nasal congestion, ear pain or sore throat. Denies chest congestion, productive cough or wheezing. Denies chest pains, palpitations and leg swelling Denies abdominal pain, nausea, vomiting,diarrhea or constipation.   Denies dysuria, frequency, hesitancy or incontinence. Denies joint pain, swelling and limitation in mobility. Denies headaches, seizures, numbness, or tingling. Denies depression, anxiety or insomnia. Denies skin break down or rash.   PE  BP 104/68 (BP Location: Right Arm, Patient Position: Sitting, Cuff Size: Large)   Pulse 100   Ht 5\' 6"  (1.676 m)   Wt 228 lb 1.9 oz (103.5 kg)   SpO2 94%   BMI 36.82 kg/m   Patient alert and oriented and in no cardiopulmonary distress.  HEENT: No facial asymmetry, EOMI,     Neck supple .  Chest: Clear to auscultation bilaterally.  CVS: S1, S2 no murmurs, no S3.Regular rate.  ABD: Soft non tender.   Ext: No edema  MS: Adequate ROM spine, shoulders, hips and knees.  Skin: Intact, no ulcerations or rash noted.  Psych: Good eye contact, normal affect. Memory intact not  anxious or depressed appearing.  CNS: CN 2-12 intact, power,  normal throughout.no focal deficits noted.   Assessment & Plan  Type 2 diabetes mellitus with vascular disease (HCC) Improving with lifestyle modification and diabetic ed Reduce lantusto 25 units Updated lab needed at/ before next visit.   Essential hypertension Controlled, no change in medication DASH diet and commitment to daily physical activity for a minimum of 30 minutes discussed and encouraged, as a part of hypertension management. The importance of attaining a healthy weight is also discussed.     08/22/2022   10:55 AM 07/11/2022   10:21 AM 06/19/2022    1:02 PM 06/19/2022    8:20 AM 05/23/2022    2:35 PM 05/17/2022   11:54 AM 05/17/2022   11:18 AM  BP/Weight  Systolic BP 104 113  98 118 110 127  Diastolic BP 68 71  58 67 72 74  Wt. (Lbs) 228.12 235.08 238 234 233  242  BMI 36.82 kg/m2 37.94 kg/m2 38.41 kg/m2 37.77 kg/m2 37.61 kg/m2  39.06 kg/m2       Hyperlipidemia LDL goal <70 Hyperlipidemia:Low fat diet discussed and encouraged.   Lipid Panel  Lab Results  Component Value Date   CHOL 120 02/12/2022   HDL 53 02/12/2022   LDLCALC 50 02/12/2022   TRIG 87 02/12/2022   CHOLHDL 2.3 02/12/2022     Controlled, no change in medication Updated lab needed at/ before next visit.   Morbid obesity  Patient re-educated about  the importance of commitment to a  minimum of 150 minutes of exercise per week as able.  The importance of healthy food choices with portion control discussed, as well as eating regularly and within a 12 hour window most days. The need to choose "clean , green" food 50 to 75% of the time is discussed, as well as to make water the primary drink and set a goal of 64 ounces water daily.       08/22/2022   10:55 AM 07/11/2022   10:21 AM 06/19/2022    1:02 PM  Weight /BMI  Weight 228 lb 1.9 oz 235 lb 1.3 oz 238 lb  Height 5\' 6"  (1.676 m) 5\' 6"  (1.676 m) 5\' 6"  (1.676 m)  BMI 36.82  kg/m2 37.94 kg/m2 38.41 kg/m2    imroved

## 2022-08-25 NOTE — Assessment & Plan Note (Signed)
Hyperlipidemia:Low fat diet discussed and encouraged.   Lipid Panel  Lab Results  Component Value Date   CHOL 120 02/12/2022   HDL 53 02/12/2022   LDLCALC 50 02/12/2022   TRIG 87 02/12/2022   CHOLHDL 2.3 02/12/2022     Controlled, no change in medication Updated lab needed at/ before next visit.

## 2022-08-25 NOTE — Assessment & Plan Note (Signed)
  Patient re-educated about  the importance of commitment to a  minimum of 150 minutes of exercise per week as able.  The importance of healthy food choices with portion control discussed, as well as eating regularly and within a 12 hour window most days. The need to choose "clean , green" food 50 to 75% of the time is discussed, as well as to make water the primary drink and set a goal of 64 ounces water daily.       08/22/2022   10:55 AM 07/11/2022   10:21 AM 06/19/2022    1:02 PM  Weight /BMI  Weight 228 lb 1.9 oz 235 lb 1.3 oz 238 lb  Height 5\' 6"  (1.676 m) 5\' 6"  (1.676 m) 5\' 6"  (1.676 m)  BMI 36.82 kg/m2 37.94 kg/m2 38.41 kg/m2    imroved

## 2022-08-25 NOTE — Assessment & Plan Note (Signed)
Improving with lifestyle modification and diabetic ed Reduce lantusto 25 units Updated lab needed at/ before next visit.

## 2022-08-25 NOTE — Assessment & Plan Note (Signed)
Controlled, no change in medication DASH diet and commitment to daily physical activity for a minimum of 30 minutes discussed and encouraged, as a part of hypertension management. The importance of attaining a healthy weight is also discussed.     08/22/2022   10:55 AM 07/11/2022   10:21 AM 06/19/2022    1:02 PM 06/19/2022    8:20 AM 05/23/2022    2:35 PM 05/17/2022   11:54 AM 05/17/2022   11:18 AM  BP/Weight  Systolic BP 104 113  98 118 110 127  Diastolic BP 68 71  58 67 72 74  Wt. (Lbs) 228.12 235.08 238 234 233  242  BMI 36.82 kg/m2 37.94 kg/m2 38.41 kg/m2 37.77 kg/m2 37.61 kg/m2  39.06 kg/m2

## 2022-10-08 ENCOUNTER — Other Ambulatory Visit: Payer: Self-pay | Admitting: Family Medicine

## 2022-10-14 ENCOUNTER — Other Ambulatory Visit: Payer: Self-pay | Admitting: Family Medicine

## 2022-10-15 DIAGNOSIS — E1159 Type 2 diabetes mellitus with other circulatory complications: Secondary | ICD-10-CM | POA: Diagnosis not present

## 2022-10-15 DIAGNOSIS — I10 Essential (primary) hypertension: Secondary | ICD-10-CM | POA: Diagnosis not present

## 2022-10-15 DIAGNOSIS — E785 Hyperlipidemia, unspecified: Secondary | ICD-10-CM | POA: Diagnosis not present

## 2022-10-16 LAB — CMP14+EGFR
ALT: 27 IU/L (ref 0–32)
AST: 27 IU/L (ref 0–40)
Albumin: 3.7 g/dL — ABNORMAL LOW (ref 3.8–4.8)
Alkaline Phosphatase: 145 IU/L — ABNORMAL HIGH (ref 44–121)
BUN/Creatinine Ratio: 21 (ref 12–28)
BUN: 27 mg/dL (ref 8–27)
Bilirubin Total: 0.3 mg/dL (ref 0.0–1.2)
CO2: 22 mmol/L (ref 20–29)
Calcium: 9.4 mg/dL (ref 8.7–10.3)
Chloride: 104 mmol/L (ref 96–106)
Creatinine, Ser: 1.27 mg/dL — ABNORMAL HIGH (ref 0.57–1.00)
Globulin, Total: 2.9 g/dL (ref 1.5–4.5)
Glucose: 177 mg/dL — ABNORMAL HIGH (ref 70–99)
Potassium: 4.6 mmol/L (ref 3.5–5.2)
Sodium: 140 mmol/L (ref 134–144)
Total Protein: 6.6 g/dL (ref 6.0–8.5)
eGFR: 45 mL/min/{1.73_m2} — ABNORMAL LOW (ref >59)

## 2022-10-16 LAB — HEMOGLOBIN A1C
Est. average glucose Bld gHb Est-mCnc: 194 mg/dL
Hgb A1c MFr Bld: 8.4 % — ABNORMAL HIGH (ref 4.8–5.6)

## 2022-10-16 LAB — LIPID PANEL
Chol/HDL Ratio: 2.3 ratio (ref 0.0–4.4)
Cholesterol, Total: 114 mg/dL (ref 100–199)
HDL: 50 mg/dL (ref >39)
LDL Chol Calc (NIH): 48 mg/dL (ref 0–99)
Triglycerides: 77 mg/dL (ref 0–149)
VLDL Cholesterol Cal: 16 mg/dL (ref 5–40)

## 2022-10-22 ENCOUNTER — Encounter: Payer: Self-pay | Admitting: Family Medicine

## 2022-10-22 ENCOUNTER — Ambulatory Visit (INDEPENDENT_AMBULATORY_CARE_PROVIDER_SITE_OTHER): Payer: Medicare HMO | Admitting: Family Medicine

## 2022-10-22 VITALS — BP 121/77 | HR 78 | Ht 66.0 in | Wt 227.0 lb

## 2022-10-22 DIAGNOSIS — I10 Essential (primary) hypertension: Secondary | ICD-10-CM | POA: Diagnosis not present

## 2022-10-22 DIAGNOSIS — E1159 Type 2 diabetes mellitus with other circulatory complications: Secondary | ICD-10-CM

## 2022-10-22 DIAGNOSIS — Z1231 Encounter for screening mammogram for malignant neoplasm of breast: Secondary | ICD-10-CM

## 2022-10-22 DIAGNOSIS — E785 Hyperlipidemia, unspecified: Secondary | ICD-10-CM | POA: Diagnosis not present

## 2022-10-22 MED ORDER — TRULICITY 4.5 MG/0.5ML ~~LOC~~ SOAJ: 4.5000 mg | SUBCUTANEOUS | 1 refills | Status: DC

## 2022-10-22 NOTE — Assessment & Plan Note (Signed)
  Patient re-educated about  the importance of commitment to a  minimum of 150 minutes of exercise per week as able.  The importance of healthy food choices with portion control discussed, as well as eating regularly and within a 12 hour window most days. The need to choose "clean , green" food 50 to 75% of the time is discussed, as well as to make water the primary drink and set a goal of 64 ounces water daily.       10/22/2022   11:05 AM 08/22/2022   10:55 AM 07/11/2022   10:21 AM  Weight /BMI  Weight 227 lb 228 lb 1.9 oz 235 lb 1.3 oz  Height 5\' 6"  (1.676 m) 5\' 6"  (1.676 m) 5\' 6"  (1.676 m)  BMI 36.64 kg/m2 36.82 kg/m2 37.94 kg/m2

## 2022-10-22 NOTE — Progress Notes (Signed)
Helen Mahoney     MRN: [id number]      DOB: September 29, 1950  Chief Complaint  Patient presents with   Follow-up    Follow up     HPI [name] is here for follow up and re-evaluation of chronic medical conditions, medication management and review of any available recent lab and radiology data.  Preventive health is updated, specifically  Cancer screening and Immunization.   Questions or concerns regarding consultations or procedures which the PT has had in the interim are  addressed.Good report from Nephrology and will need a new Dr The PT denies any adverse reactions to current medications since the last visit.  [name] reports that she has started drinking sodas again as well as eating candy and at times has noted her blood sugar to be as high as 250 in the morning, she states she actually changed this habit when she saw her hemoglobin A1c report.  She does report getting a lot of help from the nutritionist and is willing to work more consistently and harder at getting her blood sugar controlled.  She also reports at that time she has sugar lows in the early morning maybe 2 times a week but as she states her eating habits have not been consistent, she plans to changes.  ROS Denies recent fever or chills. Denies sinus pressure, nasal congestion, ear pain or sore throat. Denies chest congestion, productive cough or wheezing. Denies chest pains, palpitations and leg swelling Denies abdominal pain, nausea, vomiting,diarrhea or constipation.   Denies dysuria, frequency, hesitancy or incontinence. Denies joint pain, swelling and limitation in mobility. Denies headaches, seizures, numbness, or tingling. Denies depression, anxiety or insomnia. Denies skin break down or rash.   PE  BP 121/77 (BP Location: Right Arm, Patient Position: Sitting, Cuff Size: Large)   Pulse 78   Ht 5\' 6"  (1.676 m)   Wt 227 lb (103 kg)   SpO2 95%   BMI 36.64 kg/m   Patient alert and oriented and in  no cardiopulmonary distress.  HEENT: No facial asymmetry, EOMI,     Neck supple .  Chest: Clear to auscultation bilaterally.  CVS: S1, S2 no murmurs, no S3.Regular rate.  ABD: Soft non tender.   Ext: No edema  MS: Adequate ROM spine, shoulders, hips and knees.  Skin: Intact, no ulcerations or rash noted.  Psych: Good eye contact, normal affect. Memory intact not anxious or depressed appearing.  CNS: CN 2-12 intact, power,  normal throughout.no focal deficits noted.   Assessment & Plan  Type 2 diabetes mellitus with vascular disease (HCC) Deteriorated, increase trulicity dose and be more consistent with diet Rept HBA1C in 12 weeks [name] is reminded of the importance of commitment to daily physical activity for 30 minutes or more, as able and the need to limit carbohydrate intake to 30 to 60 grams per meal to help with blood sugar control.   The need to take medication as prescribed, test blood sugar as directed, and to call between visits if there is a concern that blood sugar is uncontrolled is also discussed.   [name] is reminded of the importance of daily foot exam, annual eye examination, and good blood sugar, blood pressure and cholesterol control.     Latest Ref Rng & Units 10/15/2022    9:08 AM 07/11/2022   11:02 AM 02/12/2022   11:30 AM 08/07/2021   10:41 AM 08/01/2021    8:49 AM  Diabetic Labs  HbA1c 4.8 -  5.6 % 8.4  7.5  10.2  10.0    Micro/Creat Ratio 0 - 29 mg/g creat   6     Chol 100 - 199 mg/dL 161   096   045   HDL >40 mg/dL 50   53   47   Calc LDL 0 - 99 mg/dL 48   50   42   Triglycerides 0 - 149 mg/dL 77   87   51   Creatinine 0.57 - 1.00 mg/dL 9.81  1.91  4.78  2.95        10/22/2022   11:05 AM 08/22/2022   10:55 AM 07/11/2022   10:21 AM 06/19/2022    1:02 PM 06/19/2022    8:20 AM 05/23/2022    2:35 PM 05/17/2022   11:54 AM  BP/Weight  Systolic BP 121 104 113  98 118 110  Diastolic BP 77 68 71  58 67 72  Wt. (Lbs) 227 228.12 235.08 238 234  233   BMI 36.64 kg/m2 36.82 kg/m2 37.94 kg/m2 38.41 kg/m2 37.77 kg/m2 37.61 kg/m2       Latest Ref Rng & Units 02/21/2022   12:00 AM 02/12/2022   10:00 AM  Foot/eye exam completion dates  Eye Exam No Retinopathy No Retinopathy       Foot Form Completion   Done     This result is from an external source.        Morbid obesity  Patient re-educated about  the importance of commitment to a  minimum of 150 minutes of exercise per week as able.  The importance of healthy food choices with portion control discussed, as well as eating regularly and within a 12 hour window most days. The need to choose "clean , green" food 50 to 75% of the time is discussed, as well as to make water the primary drink and set a goal of 64 ounces water daily.       10/22/2022   11:05 AM 08/22/2022   10:55 AM 07/11/2022   10:21 AM  Weight /BMI  Weight 227 lb 228 lb 1.9 oz 235 lb 1.3 oz  Height 5\' 6"  (1.676 m) 5\' 6"  (1.676 m) 5\' 6"  (1.676 m)  BMI 36.64 kg/m2 36.82 kg/m2 37.94 kg/m2      Essential hypertension Controlled, no change in medication DASH diet and commitment to daily physical activity for a minimum of 30 minutes discussed and encouraged, as a part of hypertension management. The importance of attaining a healthy weight is also discussed.     10/22/2022   11:05 AM 08/22/2022   10:55 AM 07/11/2022   10:21 AM 06/19/2022    1:02 PM 06/19/2022    8:20 AM 05/23/2022    2:35 PM 05/17/2022   11:54 AM  BP/Weight  Systolic BP 121 104 113  98 118 110  Diastolic BP 77 68 71  58 67 72  Wt. (Lbs) 227 228.12 235.08 238 234 233   BMI 36.64 kg/m2 36.82 kg/m2 37.94 kg/m2 38.41 kg/m2 37.77 kg/m2 37.61 kg/m2        Hyperlipidemia LDL goal <70 Hyperlipidemia:Low fat diet discussed and encouraged.   Lipid Panel  Lab Results  Component Value Date   CHOL 114 10/15/2022   HDL 50 10/15/2022   LDLCALC 48 10/15/2022   TRIG 77 10/15/2022   CHOLHDL 2.3 10/15/2022     Controlled, no change in  medication

## 2022-10-22 NOTE — Patient Instructions (Signed)
Annual exam October 18 or after, call if you need me sooner.  Nonfasting HbA1c Chem-7 and EGFR to be drawn on September 25.    Increase Trulicity to 4.5 mg weekly and change eating habits as we discussed.  Please schedule mammogram at checkout.  Blood pressure and cholesterol are excellent, you need to work on your blood sugar, you CAN do it!  It is important that you exercise regularly at least 30 minutes 5 times a week. If you develop chest pain, have severe difficulty breathing, or feel very tired, stop exercising immediately and seek medical attention  Thanks for choosing [facility], we consider it a privelige to serve you.

## 2022-10-22 NOTE — Assessment & Plan Note (Signed)
Deteriorated, increase trulicity dose and be more consistent with diet Rept HBA1C in 12 weeks Helen Mahoney is reminded of the importance of commitment to daily physical activity for 30 minutes or more, as able and the need to limit carbohydrate intake to 30 to 60 grams per meal to help with blood sugar control.   The need to take medication as prescribed, test blood sugar as directed, and to call between visits if there is a concern that blood sugar is uncontrolled is also discussed.   Helen Mahoney is reminded of the importance of daily foot exam, annual eye examination, and good blood sugar, blood pressure and cholesterol control.     Latest Ref Rng & Units 10/15/2022    9:08 AM 07/11/2022   11:02 AM 02/12/2022   11:30 AM 08/07/2021   10:41 AM 08/01/2021    8:49 AM  Diabetic Labs  HbA1c 4.8 - 5.6 % 8.4  7.5  10.2  10.0    Micro/Creat Ratio 0 - 29 mg/g creat   6     Chol 100 - 199 mg/dL 161   096   045   HDL >40 mg/dL 50   53   47   Calc LDL 0 - 99 mg/dL 48   50   42   Triglycerides 0 - 149 mg/dL 77   87   51   Creatinine 0.57 - 1.00 mg/dL 9.81  1.91  4.78  2.95        10/22/2022   11:05 AM 08/22/2022   10:55 AM 07/11/2022   10:21 AM 06/19/2022    1:02 PM 06/19/2022    8:20 AM 05/23/2022    2:35 PM 05/17/2022   11:54 AM  BP/Weight  Systolic BP 121 104 113  98 118 110  Diastolic BP 77 68 71  58 67 72  Wt. (Lbs) 227 228.12 235.08 238 234 233   BMI 36.64 kg/m2 36.82 kg/m2 37.94 kg/m2 38.41 kg/m2 37.77 kg/m2 37.61 kg/m2       Latest Ref Rng & Units 02/21/2022   12:00 AM 02/12/2022   10:00 AM  Foot/eye exam completion dates  Eye Exam No Retinopathy No Retinopathy       Foot Form Completion   Done     This result is from an external source.

## 2022-10-22 NOTE — Assessment & Plan Note (Signed)
Hyperlipidemia:Low fat diet discussed and encouraged.   Lipid Panel  Lab Results  Component Value Date   CHOL 114 10/15/2022   HDL 50 10/15/2022   LDLCALC 48 10/15/2022   TRIG 77 10/15/2022   CHOLHDL 2.3 10/15/2022     Controlled, no change in medication

## 2022-10-22 NOTE — Assessment & Plan Note (Signed)
Controlled, no change in medication DASH diet and commitment to daily physical activity for a minimum of 30 minutes discussed and encouraged, as a part of hypertension management. The importance of attaining a healthy weight is also discussed.     10/22/2022   11:05 AM 08/22/2022   10:55 AM 07/11/2022   10:21 AM 06/19/2022    1:02 PM 06/19/2022    8:20 AM 05/23/2022    2:35 PM 05/17/2022   11:54 AM  BP/Weight  Systolic BP 121 104 113  98 118 110  Diastolic BP 77 68 71  58 67 72  Wt. (Lbs) 227 228.12 235.08 238 234 233   BMI 36.64 kg/m2 36.82 kg/m2 37.94 kg/m2 38.41 kg/m2 37.77 kg/m2 37.61 kg/m2

## 2022-11-01 DIAGNOSIS — D631 Anemia in chronic kidney disease: Secondary | ICD-10-CM | POA: Diagnosis not present

## 2022-11-01 DIAGNOSIS — R809 Proteinuria, unspecified: Secondary | ICD-10-CM | POA: Diagnosis not present

## 2022-11-01 DIAGNOSIS — N183 Chronic kidney disease, stage 3 unspecified: Secondary | ICD-10-CM | POA: Diagnosis not present

## 2022-11-07 DIAGNOSIS — N2581 Secondary hyperparathyroidism of renal origin: Secondary | ICD-10-CM | POA: Diagnosis not present

## 2022-11-07 DIAGNOSIS — D638 Anemia in other chronic diseases classified elsewhere: Secondary | ICD-10-CM | POA: Diagnosis not present

## 2022-11-07 DIAGNOSIS — N1832 Chronic kidney disease, stage 3b: Secondary | ICD-10-CM | POA: Diagnosis not present

## 2022-11-07 DIAGNOSIS — E1122 Type 2 diabetes mellitus with diabetic chronic kidney disease: Secondary | ICD-10-CM | POA: Diagnosis not present

## 2022-11-19 ENCOUNTER — Telehealth: Payer: Self-pay | Admitting: Family Medicine

## 2022-11-19 ENCOUNTER — Encounter: Payer: Medicare HMO | Attending: Family Medicine | Admitting: Nutrition

## 2022-11-19 ENCOUNTER — Encounter: Payer: Self-pay | Admitting: Nutrition

## 2022-11-19 VITALS — Ht 66.0 in | Wt 221.0 lb

## 2022-11-19 DIAGNOSIS — E538 Deficiency of other specified B group vitamins: Secondary | ICD-10-CM | POA: Insufficient documentation

## 2022-11-19 DIAGNOSIS — E559 Vitamin D deficiency, unspecified: Secondary | ICD-10-CM | POA: Insufficient documentation

## 2022-11-19 DIAGNOSIS — E1159 Type 2 diabetes mellitus with other circulatory complications: Secondary | ICD-10-CM | POA: Insufficient documentation

## 2022-11-19 DIAGNOSIS — I10 Essential (primary) hypertension: Secondary | ICD-10-CM | POA: Insufficient documentation

## 2022-11-19 NOTE — Telephone Encounter (Signed)
Pt came by office, states that she has been recently prescribed Marcelline Deist and is has caused a yeast infection. Wants to see if provider will call in something to help.  Wants a call back

## 2022-11-19 NOTE — Patient Instructions (Addendum)
Goals Go by PCP to discuss bladder infection from Fargixa, Call Kidney Dr to let them about the bladder issue. Stop Fargixa for now. Talk to Dr. Lodema Hong office about cost of Januvia and possible medication assistance for it. Focus on eating three meals and more whole plant based foods with meals. Cut out junk food, sweets and processed foods. Prevent low blood sugars Get wt down 10 lbs in 3 months.-210 lbs.

## 2022-11-19 NOTE — Progress Notes (Signed)
Medical Nutrition Therapy  Appointment Start time:  1100  Appointment End time:  41660  Primary concerns today: Obesity and DM Type 2, CKD Referral diagnosis: E11.8, E66.9, N18.3 Preferred learning style: No preference.  Learning readiness: Change in progress    NUTRITION ASSESSMENT  72 yr old bfemale here for uncontrolled DM and desired weight loss. PCP Dr. Lodema Hong.  Kidney Md Dr. Wolfgang Phoenix, Lost 7 lbs since last visit. Saw kidney md and was put on Farxiga in June. Was taking 10 mg EOD.  Now has bladder infection. Will go by PCP and get prescription for it.  Will call kidney specialist to discuss bladder infection since being on the Fargixa. Got off track. A1C up to 8.4% .Had to reduce her Lantus from 50 units a day to 30 units a day due to having so many low blood sugar epidodes. She thinks her BS went up due to having to eat a lot of sweets and foods between meals to keep her BS from always dropping. Has a Dexcom and 90% of BS have been in range in the last 2 weeks. 1% below range. Was prescribed Januvia but it is going to cost $100 and can't afford it. Requested her to talk to PCP PHarmacist to see if there is medication assistance for it.   She is back to eating better now. Eating more foods from garden and cut out junk food and sweets.  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  A1C up to 8.4% from 7.4%. Mostly related to over eating to prevent BS from dropping. Has reduced Lantus to 30 and reduced Glipizide also. She has been using her Dexcom and has learned a lot about what she eats and it's impact on her blood sugars. Has changed a lot of her eating habits.   Anthropometrics  Wt Readings from Last 3 Encounters:  10/22/22 227 lb (103 kg)  08/22/22 228 lb 1.9 oz (103.5 kg)  07/11/22 235 lb 1.3 oz (106.6 kg)   Ht Readings from Last 3 Encounters:  10/22/22 5\' 6"  (1.676 m)  08/22/22 5\' 6"  (1.676 m)  07/11/22 5\' 6"  (1.676 m)   There is  no height or weight on file to calculate BMI. @BMIFA @ Facility age limit for growth %iles is 20 years. Facility age limit for growth %iles is 20 years.    Clinical Medical Hx: See chart Medications: Lantus 50 units at night, Glipizide Labs:  Lab Results  Component Value Date   HGBA1C 8.4 (H) 10/15/2022       Latest Ref Rng & Units 10/15/2022    9:08 AM 07/11/2022   11:02 AM 02/12/2022   11:30 AM  CMP  Glucose 70 - 99 mg/dL 630  160  109   BUN 8 - 27 mg/dL 27  36  27   Creatinine 0.57 - 1.00 mg/dL 3.23  5.57  3.22   Sodium 134 - 144 mmol/L 140  137  138   Potassium 3.5 - 5.2 mmol/L 4.6  4.9  4.1   Chloride 96 - 106 mmol/L 104  99  98   CO2 20 - 29 mmol/L 22  20  23    Calcium 8.7 - 10.3 mg/dL 9.4  9.3  9.1   Total Protein 6.0 - 8.5 g/dL 6.6   7.1   Total Bilirubin 0.0 - 1.2 mg/dL 0.3   0.4   Alkaline Phos 44 - 121 IU/L 145   147   AST 0 -  40 IU/L 27   26   ALT 0 - 32 IU/L 27   27    Lipid Panel     Component Value Date/Time   CHOL 114 10/15/2022 0908   TRIG 77 10/15/2022 0908   HDL 50 10/15/2022 0908   CHOLHDL 2.3 10/15/2022 0908   CHOLHDL 3.3 03/30/2019 0914   VLDL 29 11/04/2016 1033   LDLCALC 48 10/15/2022 0908   LDLCALC 106 (H) 03/30/2019 0914   LABVLDL 16 10/15/2022 0908    Notable Signs/Symptoms: Tired, craves sweets  Lifestyle & Dietary Hx Lives by herself. Cooks at home and eats out.  Estimated daily fluid intake: 30 oz Supplements:  Sleep: 6-7  Stress / self-care:  Current average weekly physical activity: ADL  24-Hr Dietary Recall First Meal: egg mcmuffin,water  Snack:  Second Baked chicken and boccoli and cheese, water Snack:  Third Meal:  crab meat, water, cheerries,water Snack: nabs Beverages: water  Estimated Energy Needs Calories: 1400 Carbohydrate: 158g Protein: 105g Fat: 39g   NUTRITION DIAGNOSIS  NB-1.1 Food and nutrition-related knowledge deficit As related to Diabetes Type 2.  As evidenced by A1C 10.2%.   NUTRITION  INTERVENTION  Nutrition education (E-1) on the following topics:  Nutrition and Diabetes education provided on My Plate, CHO counting, meal planning, portion sizes, timing of meals, avoiding snacks between meals unless having a low blood sugar, target ranges for A1C and blood sugars, signs/symptoms and treatment of hyper/hypoglycemia, monitoring blood sugars, taking medications as prescribed, benefits of exercising 30 minutes per day and prevention of complications of DM.  Lifestyle Medicine  - Whole Food, Plant Predominant Nutrition is highly recommended: Eat Plenty of vegetables, Mushrooms, fruits, Legumes, Whole Grains, Nuts, seeds in lieu of processed meats, processed snacks/pastries red meat, poultry, eggs.    -It is better to avoid simple carbohydrates including: Cakes, Sweet Desserts, Ice Cream, Soda (diet and regular), Sweet Tea, Candies, Chips, Cookies, Store Bought Juices, Alcohol in Excess of  1-2 drinks a day, Lemonade,  Artificial Sweeteners, Doughnuts, Coffee Creamers, "Sugar-free" Products, etc, etc.  This is not a complete list.....  Exercise: If you are able: 30 -60 minutes a day ,4 days a week, or 150 minutes a week.  The longer the better.  Combine stretch, strength, and aerobic activities.  If you were told in the past that you have high risk for cardiovascular diseases, you may seek evaluation by your heart doctor prior to initiating moderate to intense exercise programs.    Handouts Provided Include  Lifestyle Medicine Dexcom insturctions  Learning Style & Readiness for Change Teaching method utilized: Visual & Auditory  Demonstrated degree of understanding via: Teach Back  Barriers to learning/adherence to lifestyle change: None  Goals Established by Pt Goals Go by PCP to discuss bladder infection from Fargixa, Call Kidney Dr to let them about the bladder issue. Stop Fargixa for now. Talk to Dr. Lodema Hong office about cost of Januvia and possible medication assistance  for it. Focus on eating three meals and more whole plant based foods with meals. Cut out junk food, sweets and processed foods. Prevent low blood sugars Get wt down 10 lbs in 3 months.-210 lbs.  MONITORING & EVALUATION Dietary intake, weekly physical activity, and weight and BS in 3 month. Recommend to consider stopping GLipizide dose in am and consider reducing night time insulin to 28 units due to low blood sugars in am and middle of the night at times.  Next Steps  Patient is to work on eating more plant  based foods and cut out chips and sodas.Marland Kitchen

## 2022-11-19 NOTE — Telephone Encounter (Signed)
I SPOKE WITH PT , STATES MUCH BETTER , I ADVISED Otc MIONISTAT IF NEEDED  PLS ORDER NON FASTING Hba1c, CHEM 7 AND EgfR AND tsh TO BE  DRAWN 5 TO 7 DAYS BEFORE HER oCT APPT, AND MAIL TO HER, THANKS

## 2022-11-20 ENCOUNTER — Other Ambulatory Visit: Payer: Self-pay

## 2022-11-20 DIAGNOSIS — I10 Essential (primary) hypertension: Secondary | ICD-10-CM

## 2022-11-20 DIAGNOSIS — E1159 Type 2 diabetes mellitus with other circulatory complications: Secondary | ICD-10-CM

## 2022-11-20 NOTE — Telephone Encounter (Signed)
Labs printed and mailed to patient

## 2022-11-23 ENCOUNTER — Other Ambulatory Visit: Payer: Self-pay | Admitting: Family Medicine

## 2022-12-01 ENCOUNTER — Other Ambulatory Visit: Payer: Self-pay | Admitting: Family Medicine

## 2022-12-06 ENCOUNTER — Encounter: Payer: Self-pay | Admitting: Family Medicine

## 2022-12-06 ENCOUNTER — Inpatient Hospital Stay: Payer: Medicare HMO | Attending: Hematology

## 2022-12-06 DIAGNOSIS — Z79899 Other long term (current) drug therapy: Secondary | ICD-10-CM | POA: Insufficient documentation

## 2022-12-06 DIAGNOSIS — Z818 Family history of other mental and behavioral disorders: Secondary | ICD-10-CM | POA: Insufficient documentation

## 2022-12-06 DIAGNOSIS — E119 Type 2 diabetes mellitus without complications: Secondary | ICD-10-CM | POA: Insufficient documentation

## 2022-12-06 DIAGNOSIS — Z803 Family history of malignant neoplasm of breast: Secondary | ICD-10-CM | POA: Diagnosis not present

## 2022-12-06 DIAGNOSIS — E785 Hyperlipidemia, unspecified: Secondary | ICD-10-CM | POA: Insufficient documentation

## 2022-12-06 DIAGNOSIS — Z800 Family history of malignant neoplasm of digestive organs: Secondary | ICD-10-CM | POA: Diagnosis not present

## 2022-12-06 DIAGNOSIS — R768 Other specified abnormal immunological findings in serum: Secondary | ICD-10-CM | POA: Insufficient documentation

## 2022-12-06 DIAGNOSIS — Z9049 Acquired absence of other specified parts of digestive tract: Secondary | ICD-10-CM | POA: Insufficient documentation

## 2022-12-06 DIAGNOSIS — Z8719 Personal history of other diseases of the digestive system: Secondary | ICD-10-CM | POA: Insufficient documentation

## 2022-12-06 DIAGNOSIS — Z823 Family history of stroke: Secondary | ICD-10-CM | POA: Insufficient documentation

## 2022-12-06 DIAGNOSIS — D472 Monoclonal gammopathy: Secondary | ICD-10-CM | POA: Diagnosis not present

## 2022-12-06 DIAGNOSIS — E538 Deficiency of other specified B group vitamins: Secondary | ICD-10-CM | POA: Insufficient documentation

## 2022-12-06 DIAGNOSIS — Z841 Family history of disorders of kidney and ureter: Secondary | ICD-10-CM | POA: Insufficient documentation

## 2022-12-06 DIAGNOSIS — Z833 Family history of diabetes mellitus: Secondary | ICD-10-CM | POA: Diagnosis not present

## 2022-12-06 DIAGNOSIS — D563 Thalassemia minor: Secondary | ICD-10-CM | POA: Insufficient documentation

## 2022-12-06 DIAGNOSIS — D508 Other iron deficiency anemias: Secondary | ICD-10-CM

## 2022-12-06 DIAGNOSIS — Z887 Allergy status to serum and vaccine status: Secondary | ICD-10-CM | POA: Diagnosis not present

## 2022-12-06 DIAGNOSIS — Z8249 Family history of ischemic heart disease and other diseases of the circulatory system: Secondary | ICD-10-CM | POA: Insufficient documentation

## 2022-12-06 DIAGNOSIS — E669 Obesity, unspecified: Secondary | ICD-10-CM | POA: Insufficient documentation

## 2022-12-06 DIAGNOSIS — E559 Vitamin D deficiency, unspecified: Secondary | ICD-10-CM | POA: Insufficient documentation

## 2022-12-06 DIAGNOSIS — D509 Iron deficiency anemia, unspecified: Secondary | ICD-10-CM | POA: Insufficient documentation

## 2022-12-06 DIAGNOSIS — Z880 Allergy status to penicillin: Secondary | ICD-10-CM | POA: Diagnosis not present

## 2022-12-06 LAB — COMPREHENSIVE METABOLIC PANEL
ALT: 36 U/L (ref 0–44)
AST: 28 U/L (ref 15–41)
Albumin: 3.3 g/dL — ABNORMAL LOW (ref 3.5–5.0)
Alkaline Phosphatase: 110 U/L (ref 38–126)
Anion gap: 8 (ref 5–15)
BUN: 32 mg/dL — ABNORMAL HIGH (ref 8–23)
CO2: 24 mmol/L (ref 22–32)
Calcium: 8.6 mg/dL — ABNORMAL LOW (ref 8.9–10.3)
Chloride: 103 mmol/L (ref 98–111)
Creatinine, Ser: 1.42 mg/dL — ABNORMAL HIGH (ref 0.44–1.00)
GFR, Estimated: 39 mL/min — ABNORMAL LOW (ref >60)
Glucose, Bld: 71 mg/dL (ref 70–99)
Potassium: 4.1 mmol/L (ref 3.5–5.1)
Sodium: 135 mmol/L (ref 135–145)
Total Bilirubin: 0.4 mg/dL (ref 0.3–1.2)
Total Protein: 7.1 g/dL (ref 6.5–8.1)

## 2022-12-06 LAB — CBC WITH DIFFERENTIAL/PLATELET
Abs Immature Granulocytes: 0.04 10*3/uL (ref 0.00–0.07)
Basophils Absolute: 0.1 10*3/uL (ref 0.0–0.1)
Basophils Relative: 1 %
Eosinophils Absolute: 0.5 10*3/uL (ref 0.0–0.5)
Eosinophils Relative: 5 %
HCT: 34 % — ABNORMAL LOW (ref 36.0–46.0)
Hemoglobin: 10.3 g/dL — ABNORMAL LOW (ref 12.0–15.0)
Immature Granulocytes: 0 %
Lymphocytes Relative: 27 %
Lymphs Abs: 2.4 10*3/uL (ref 0.7–4.0)
MCH: 21.5 pg — ABNORMAL LOW (ref 26.0–34.0)
MCHC: 30.3 g/dL (ref 30.0–36.0)
MCV: 71.1 fL — ABNORMAL LOW (ref 80.0–100.0)
Monocytes Absolute: 0.6 10*3/uL (ref 0.1–1.0)
Monocytes Relative: 7 %
Neutro Abs: 5.4 10*3/uL (ref 1.7–7.7)
Neutrophils Relative %: 60 %
Platelets: 302 10*3/uL (ref 150–400)
RBC: 4.78 MIL/uL (ref 3.87–5.11)
RDW: 16.2 % — ABNORMAL HIGH (ref 11.5–15.5)
WBC: 9 10*3/uL (ref 4.0–10.5)
nRBC: 0 % (ref 0.0–0.2)

## 2022-12-06 LAB — IRON AND TIBC
Iron: 55 ug/dL (ref 28–170)
Saturation Ratios: 18 % (ref 10.4–31.8)
TIBC: 300 ug/dL (ref 250–450)
UIBC: 245 ug/dL

## 2022-12-06 LAB — FERRITIN: Ferritin: 156 ng/mL (ref 11–307)

## 2022-12-06 LAB — VITAMIN D 25 HYDROXY (VIT D DEFICIENCY, FRACTURES): Vit D, 25-Hydroxy: 53.55 ng/mL (ref 30–100)

## 2022-12-06 LAB — VITAMIN B12: Vitamin B-12: 359 pg/mL (ref 180–914)

## 2022-12-13 ENCOUNTER — Inpatient Hospital Stay: Payer: Medicare HMO | Admitting: Oncology

## 2022-12-13 VITALS — BP 95/65 | HR 81 | Temp 98.1°F | Resp 16 | Ht 66.0 in | Wt 224.4 lb

## 2022-12-13 DIAGNOSIS — D472 Monoclonal gammopathy: Secondary | ICD-10-CM | POA: Diagnosis not present

## 2022-12-13 DIAGNOSIS — E538 Deficiency of other specified B group vitamins: Secondary | ICD-10-CM | POA: Diagnosis not present

## 2022-12-13 DIAGNOSIS — E785 Hyperlipidemia, unspecified: Secondary | ICD-10-CM | POA: Diagnosis not present

## 2022-12-13 DIAGNOSIS — D563 Thalassemia minor: Secondary | ICD-10-CM

## 2022-12-13 DIAGNOSIS — D509 Iron deficiency anemia, unspecified: Secondary | ICD-10-CM | POA: Diagnosis not present

## 2022-12-13 DIAGNOSIS — E119 Type 2 diabetes mellitus without complications: Secondary | ICD-10-CM | POA: Diagnosis not present

## 2022-12-13 DIAGNOSIS — E559 Vitamin D deficiency, unspecified: Secondary | ICD-10-CM | POA: Diagnosis not present

## 2022-12-13 DIAGNOSIS — E669 Obesity, unspecified: Secondary | ICD-10-CM | POA: Diagnosis not present

## 2022-12-13 DIAGNOSIS — R768 Other specified abnormal immunological findings in serum: Secondary | ICD-10-CM | POA: Diagnosis not present

## 2022-12-13 NOTE — Progress Notes (Signed)
[facility] [address], [place] [address]   CLINIC:  Medical Oncology/Hematology  PCP:  Kerri Perches, MD [address] [place] [address] [phone]   REASON FOR VISIT:  Follow-up for alpha thalassemia minor, iron deficiency anemia, and B12 deficiency + new MGUS   PRIOR THERAPY: IV iron (last given April 2017)   CURRENT THERAPY: Oral iron supplementation  INTERVAL HISTORY:   Ms. Fuchs 72 y.o. female returns for routine follow-up of alpha thalassemia minor, iron deficiency, B12 deficiency and MGUS.  She was last evaluated by Rojelio Brenner PA-C on 06/19/22.  In the interim, she has done well.  Denies any recent hospitalizations, surgeries or changes in baseline health.  Today, she reports doing well denies any new concerns. She denies any recent bleeding such as epistaxis, hematemesis, hematochezia, or melena. She reports that her energy is at baseline with chronic fatigue.  She denies any pica, restless legs, headaches, lightheadedness, or syncope.  No chest pain or dyspnea on exertion.   She continues to take oral iron, B12 and vitamin D.  She was instructed to stop taking folic acid.  She continues to follow with Dr. Wolfgang Phoenix in nephrology.  Appetite is 100% energy levels are 70%.  She denies any pain.  ASSESSMENT & PLAN:  1.   Microcytic anemia with alpha thalassemia minor - Longstanding microcytic anemia dating back to before 2008, alpha thalassemia genotype study done on 08/17/2015 - Ongoing mild (Hgb > 10.0) microcytic anemia attributable to alpha thalassemia trait - She has previously required IV Feraheme (last given April 2017), currently on daily oral iron supplement  - Most recent colonoscopy (08/07/2020): Nonbleeding internal hemorrhoids, diverticulosis, polyp x1 - EGD (05/15/2015): Gastric polyps, GE junction stricture - Capsule study (06/02/2015): Occasional erosion in the duodenum.  No masses, ulcers, or AVMs seen.   Occasional lymphangiectasia. - SPEP checked in 2017 was normal.  Hemoccult stool checked in 2012 was negative. - Her baseline hemoglobin ranges from 9.5-11.0 - Most recent labs from 12/06/2022 show hemoglobin of 10.3, MCV 71.0, B12 359, iron saturation 18% and ferritin 156. - No bright red blood per rectum or melena - Chronic fatigue is at baseline. - PLAN:  -No indication for IV iron at this time. - Continue iron and B12 supplements. -Continue to hold folic acid.  Will recheck folate levels at next visit. - Repeat labs and RTC in 6 months.  2.  Abnormal SPEP concerning for possible MGUS - She continues to follow with Dr. Wolfgang Phoenix (nephrology) for chronic kidney disease.  At most recent nephrology visit on 05/13/2022, Dr. Wolfgang Phoenix noted that patient had mildly elevated free light chain ratio. - Labs from Dr. Wolfgang Phoenix (04/15/2022): Elevated kappa light chain 66.5, elevated lambda 30.1.  Ratio mildly elevated at 2.21, difficult to interpret in the setting of CKD. SPEP showed poorly defined band of restricted protein mobility in gammaglobulin region, but no quantifiable M spike. 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 Urine immunofixation showed polyclonal light chains, negative for monoclonal immunoglobulin or free light chain - Hematology workup (05/23/2022): Immunofixation shows polyclonal increase in immunoglobulins SPEP negative Mildly elevated beta-2 microglobulin 3.9 in the setting of CKD.  LDH normal. Elevated kappa free light chain 87.4, elevated lambda free light chain 35.8, mildly elevated kappa lambda ratio 2.44 (difficult to interpret in the setting of CKD) - No new onset bone pain or neurologic changes.  No B symptoms. -Polyclonal increase in immunoglobulins can be seen in variety of  chronic inflammatory states.  No evidence of monoclonal protein or plasma cell dyscrasia at this time. - Will plan on repeating MGUS/myeloma panel in 1 year to rule out evolving  plasma cell dyscrasia. - PLAN:  -Repeat MGUS/myeloma labs annually next due in Jan 2025.    3.  Vitamin B12 deficiency - Patient had been receiving monthly B12 injections since February 2017 - Labs in November 2021 showed vitamin B12 level > 7500, therefore injections were held - She was placed on oral B12 tablets in June 2022   - Most recent B12 level from 12/16/2022 was 359 and MMA 298. - PLAN:  -Continue oral B12 supplementation.  Check B12/MMA at follow-up visit in 6 months.   4.  Folate deficiency - Labs on 10/03/2020 showed folate decreased at 5.4 - She was started on folic acid supplementation in June 2022 - Most recent folic acid is normal at 32.3 -She was instructed to stop folic acid a few months ago. - PLAN:  -Continue to hold folic acid for now. -Will recheck labs at next visit.  5.  Vitamin D deficiency - She takes vitamin D 2,000 units daily - Most most recent vitamin D level from 12/06/2022 was 53.55. - PLAN:  -Continue vitamin D 2000 units daily.   -We will recheck levels every 6 months.   PLAN SUMMARY: >> RTC in 6 months for follow-up with lab work (cbc, cmp, vit d, b12, mma, folate, spep, IFE, kappa lamda light chain, iron, ferritin) and see a provider.  Labs a few days before.     REVIEW OF SYSTEMS:   Review of Systems  All other systems reviewed and are negative.    PHYSICAL EXAM:  ECOG PERFORMANCE STATUS: 1 - Symptomatic but completely ambulatory  Vitals:   12/13/22 0927  BP: 95/65  Pulse: 81  Resp: 16  Temp: 98.1 F (36.7 C)  SpO2: 99%   Filed Weights   12/13/22 0927  Weight: 224 lb 6.9 oz (101.8 kg)   Physical Exam Constitutional:      Appearance: Normal appearance.  HENT:     Head: Normocephalic and atraumatic.  Eyes:     Pupils: Pupils are equal, round, and reactive to light.  Cardiovascular:     Rate and Rhythm: Normal rate and regular rhythm.     Heart sounds: Normal heart sounds. No murmur heard. Pulmonary:     Effort: Pulmonary  effort is normal.     Breath sounds: Normal breath sounds. No wheezing.  Abdominal:     General: Bowel sounds are normal. There is no distension.     Palpations: Abdomen is soft.     Tenderness: There is no abdominal tenderness.  Musculoskeletal:        General: Normal range of motion.     Cervical back: Normal range of motion.  Skin:    General: Skin is warm and dry.     Findings: No rash.  Neurological:     Mental Status: She is alert and oriented to person, place, and time.  Psychiatric:        Judgment: Judgment normal.     PAST MEDICAL/SURGICAL HISTORY:  Past Medical History:  Diagnosis Date   Agatston coronary artery calcium score less than 100    51 on coronary CT 03/2021   Alpha-0- thalassemia trait/carrier 05/18/2015   2013: TCS/EGD 2017: TCS/EGD HYPERPLASTIC GASTRIC POLYPS, LYMPHOCYTIC GASTRITIS    B12 deficiency 06/16/2015   Colon polyps    Diabetes mellitus, type 2 (HCC)  Hyperlipidemia    Hypertension    Microcytic anemia 05/18/2015   2013: TCS/EGD 2017: TCS/EGD HYPERPLASTIC GASTRIC POLYPS, LYMPHOCYTIC GASTRITIS    Obesity    Past Surgical History:  Procedure Laterality Date   bilateral tubal ligation  1979   CHOLECYSTECTOMY  2001   ACUTE CHOLECYSTITIS/GALLSTONES   COLONOSCOPY  05/14/2011   Dr. Darrick Penna: sessile polyp in sigmoid colon, internal hemorrhoids, hyerplastic polyps   COLONOSCOPY N/A 05/15/2015   Procedure: COLONOSCOPY;  Surgeon: West Bali, MD;  [facility]: [facility];  Service: Endoscopy;  Laterality: N/A;  0830   COLONOSCOPY W/ POLYPECTOMY  NOV 2008 [facility] ANEMIA   POLYP NO RETRIEVED   COLONOSCOPY W/ POLYPECTOMY  2006 DR. SMTH   POLYP?   COLONOSCOPY WITH PROPOFOL N/A 08/07/2020   Procedure: COLONOSCOPY WITH PROPOFOL;  Surgeon: Lanelle Bal, DO;  Location: [facility];  Service: Endoscopy;  Laterality: N/A;  ASA II / AM procedure   ESOPHAGOGASTRODUODENOSCOPY  05/14/2011   Dr. Darrick Penna: sessile polyps in the cardia, mild gastritis. Chronic  duodenitis consistent with peptic duodenitis, chronic active H.pylori gastritis.    ESOPHAGOGASTRODUODENOSCOPY N/A 05/15/2015   Procedure: ESOPHAGOGASTRODUODENOSCOPY (EGD);  Surgeon: West Bali, MD;  [facility]: [facility];  Service: Endoscopy;  Laterality: N/A;   GIVENS CAPSULE STUDY N/A 06/02/2015   Procedure: GIVENS CAPSULE STUDY;  Surgeon: West Bali, MD;  [facility]: [facility];  Service: Endoscopy;  Laterality: N/A;  0700   POLYPECTOMY  08/07/2020   Procedure: POLYPECTOMY;  Surgeon: Lanelle Bal, DO;  [facility]: [facility];  Service: Endoscopy;;   TUBAL LIGATION     UPPER GASTROINTESTINAL ENDOSCOPY  NOV 2008 [facility] ANEMIA   NL EXAM, urease neg    SOCIAL HISTORY:  Social History   Socioeconomic History   Marital status: Married    Spouse name: Not on file   Number of children: Not on file   Years of education: Not on file   Highest education level: Not on file  Occupational History   Not on file  Tobacco Use   Smoking status: Never   Smokeless tobacco: Never  Vaping Use   Vaping status: Never Used  Substance and Sexual Activity   Alcohol use: No   Drug use: No   Sexual activity: Not Currently  Other Topics Concern   Not on file  Social History Narrative   Not on file   Social Determinants of Health   Financial Resource Strain: Low Risk  (12/28/2020)   Overall Financial Resource Strain (CARDIA)    Difficulty of Paying Living Expenses: Not hard at all  Food Insecurity: No Food Insecurity (12/28/2020)   Hunger Vital Sign    Worried About Running Out of Food in the Last Year: Never true    Ran Out of Food in the Last Year: Never true  Transportation Needs: No Transportation Needs (12/28/2020)   PRAPARE - Administrator, Civil Service (Medical): No    Lack of Transportation (Non-Medical): No  Physical Activity: Insufficiently Active (12/28/2019)   Exercise Vital Sign    Days of Exercise per Week: 3 days    Minutes of Exercise per Session: 30 min   Stress: No Stress Concern Present (12/28/2020)   [employer] of Occupational Health - Occupational Stress Questionnaire    Feeling of Stress : Not at all  Social Connections: Moderately Isolated (12/28/2020)   Social Connection and Isolation Panel [NHANES]    Frequency of Communication with Friends and Family: More than three  times a week    Frequency of Social Gatherings with Friends and Family: More than three times a week    Attends Religious Services: More than 4 times per year    Active Member of [employer] or Organizations: No    Attends Banker Meetings: Never    Marital Status: Widowed  Intimate Partner Violence: Not At Risk (12/28/2019)   Humiliation, Afraid, Rape, and Kick questionnaire    Fear of Current or Ex-Partner: No    Emotionally Abused: No    Physically Abused: No    Sexually Abused: No    FAMILY HISTORY:  Family History  Problem Relation Age of Onset   Heart disease Mother    Diabetes Mother    Hypertension Mother    Stroke Mother    Breast cancer Mother    Diabetes Father    Heart attack Father    Kidney disease Father    Hypertension Sister    Diabetes Sister    Diabetes Sister    Mental illness Sister    Hypertension Sister    Hepatitis C Sister    Mental illness Sister    Diabetes Sister    Hypertension Sister    Mental illness Sister    Colon cancer Sister 62   Diabetes Brother     CURRENT MEDICATIONS:  Outpatient Encounter Medications as of 12/13/2022  Medication Sig   Alcohol Swabs (DROPSAFE ALCOHOL PREP) 70 % PADS USE AS DIRECTED THREE TIMES DAILY   aspirin 81 MG tablet Take 81 mg by mouth daily.   Blood Glucose Calibration (TRUE METRIX LEVEL 1) Low SOLN USE AS DIRECTED AS NEEDED   blood glucose meter kit and supplies Dispense based on patient and insurance preference. Once daily testing DX E11.9   Cholecalciferol (VITAMIN D3) 2000 units TABS Take 2,000 Units by mouth daily.   Continuous Blood Gluc Receiver (DEXCOM G7 RECEIVER)  DEVI Use to monitor blood sugar daily.   Continuous Glucose Sensor (DEXCOM G7 SENSOR) MISC USE TO CHECK GLUCOSE DAILY   DROPLET PEN NEEDLES 31G X 8 MM MISC USE EVERY DAY AS DIRECTED.   Dulaglutide (TRULICITY) 4.5 MG/0.5ML SOPN Inject 4.5 mg as directed once a week.   ezetimibe (ZETIA) 10 MG tablet TAKE 1 TABLET (10 MG TOTAL) BY MOUTH DAILY.   ferrous sulfate 325 (65 FE) MG tablet Take 325 mg by mouth daily with breakfast.   folic acid (FOLVITE) 800 MCG tablet Take 800 mcg by mouth daily.   glipiZIDE (GLUCOTROL) 10 MG tablet Take 1 tablet (10 mg total) by mouth daily before breakfast.   glucose blood test strip Use as instructed   Lancets 30G MISC Once daily testing dx e11.9   LANTUS SOLOSTAR 100 UNIT/ML Solostar Pen INJECT 50 UNITS SUBCUTANEOUSLY ONCE DAILY AT  10  PM   lisinopril-hydrochlorothiazide (ZESTORETIC) 20-12.5 MG tablet TAKE 2 TABLETS ONE TIME DAILY FOR BLOOD PRESSURE (DOSE INCREASE)   rosuvastatin (CRESTOR) 40 MG tablet TAKE 1 TABLET (40 MG TOTAL) BY MOUTH DAILY.   TRUE METRIX BLOOD GLUCOSE TEST test strip TEST BLOOD SUGAR THREE TIMES DAILY   TRUEplus Lancets 33G MISC USE TO TEST BLOOD SUGAR THREE TIMES DAILY   No facility-administered encounter medications on file as of 12/13/2022.    ALLERGIES:  Allergies  Allergen Reactions   Tetanus Toxoids     Can't walk the next day(happened twice)    Penicillins Rash    Has patient had a PCN reaction causing immediate rash, facial/tongue/throat swelling, SOB or lightheadedness with  hypotension: Yes/No:[id number] Has patient had a PCN reaction causing severe rash involving mucus membranes or skin necrosis: Yes/No:[id number] rash Has patient had a PCN reaction that required hospitalization Yes/No:[id number] Has patient had a PCN reaction occurring within the last 10 years: Yes/No:[id number] If all of the above answers are NO, then may proceed with Cephalos    LABORATORY DATA:  I have reviewed the labs as listed.  CBC     Component Value Date/Time   WBC 9.0 12/06/2022 1051   RBC 4.78 12/06/2022 1051   HGB 10.3 (L) 12/06/2022 1051   HGB 11.0 (L) 08/07/2021 1041   HCT 34.0 (L) 12/06/2022 1051   HCT 36.4 08/07/2021 1041   PLT 302 12/06/2022 1051   PLT 318 08/07/2021 1041   MCV 71.1 (L) 12/06/2022 1051   MCV 72 (L) 08/07/2021 1041   MCH 21.5 (L) 12/06/2022 1051   MCHC 30.3 12/06/2022 1051   RDW 16.2 (H) 12/06/2022 1051   RDW 15.9 (H) 08/07/2021 1041   LYMPHSABS 2.4 12/06/2022 1051   LYMPHSABS 2.4 08/14/2020 0914   MONOABS 0.6 12/06/2022 1051   EOSABS 0.5 12/06/2022 1051   EOSABS 0.4 08/14/2020 0914   BASOSABS 0.1 12/06/2022 1051   BASOSABS 0.1 08/14/2020 0914      Latest Ref Rng & Units 12/06/2022   10:51 AM 10/15/2022    9:08 AM 07/11/2022   11:02 AM  CMP  Glucose 70 - 99 mg/dL 71  469  629   BUN 8 - 23 mg/dL 32  27  36   Creatinine 0.44 - 1.00 mg/dL 5.28  4.13  2.44   Sodium 135 - 145 mmol/L 135  140  137   Potassium 3.5 - 5.1 mmol/L 4.1  4.6  4.9   Chloride 98 - 111 mmol/L 103  104  99   CO2 22 - 32 mmol/L 24  22  20    Calcium 8.9 - 10.3 mg/dL 8.6  9.4  9.3   Total Protein 6.5 - 8.1 g/dL 7.1  6.6    Total Bilirubin 0.3 - 1.2 mg/dL 0.4  0.3    Alkaline Phos 38 - 126 U/L 110  145    AST 15 - 41 U/L 28  27    ALT 0 - 44 U/L 36  27      DIAGNOSTIC IMAGING:  I have independently reviewed the relevant imaging and discussed with the patient.   WRAP UP:  All questions were answered. The patient knows to call the clinic with any problems, questions or concerns.  Medical decision making: Moderate  Time spent on visit:I spent 20 minutes dedicated to the care of this patient (face-to-face and non-face-to-face) on the date of the encounter to include what is described in the assessment and plan.  Mauro Kaufmann, NP  12/13/22 1:01 PM

## 2022-12-18 DIAGNOSIS — N189 Chronic kidney disease, unspecified: Secondary | ICD-10-CM | POA: Diagnosis not present

## 2022-12-18 DIAGNOSIS — I5032 Chronic diastolic (congestive) heart failure: Secondary | ICD-10-CM | POA: Diagnosis not present

## 2022-12-18 DIAGNOSIS — N1832 Chronic kidney disease, stage 3b: Secondary | ICD-10-CM | POA: Diagnosis not present

## 2022-12-18 DIAGNOSIS — E1122 Type 2 diabetes mellitus with diabetic chronic kidney disease: Secondary | ICD-10-CM | POA: Diagnosis not present

## 2022-12-18 DIAGNOSIS — Z7689 Persons encountering health services in other specified circumstances: Secondary | ICD-10-CM | POA: Diagnosis not present

## 2022-12-18 DIAGNOSIS — D638 Anemia in other chronic diseases classified elsewhere: Secondary | ICD-10-CM | POA: Diagnosis not present

## 2022-12-18 DIAGNOSIS — N2581 Secondary hyperparathyroidism of renal origin: Secondary | ICD-10-CM | POA: Diagnosis not present

## 2022-12-18 DIAGNOSIS — D649 Anemia, unspecified: Secondary | ICD-10-CM | POA: Diagnosis not present

## 2022-12-22 ENCOUNTER — Other Ambulatory Visit: Payer: Self-pay | Admitting: Family Medicine

## 2023-01-01 ENCOUNTER — Other Ambulatory Visit: Payer: Self-pay | Admitting: Family Medicine

## 2023-01-01 DIAGNOSIS — N2581 Secondary hyperparathyroidism of renal origin: Secondary | ICD-10-CM | POA: Diagnosis not present

## 2023-01-01 DIAGNOSIS — E1122 Type 2 diabetes mellitus with diabetic chronic kidney disease: Secondary | ICD-10-CM | POA: Diagnosis not present

## 2023-01-01 DIAGNOSIS — D638 Anemia in other chronic diseases classified elsewhere: Secondary | ICD-10-CM | POA: Diagnosis not present

## 2023-01-01 DIAGNOSIS — N1832 Chronic kidney disease, stage 3b: Secondary | ICD-10-CM | POA: Diagnosis not present

## 2023-01-11 ENCOUNTER — Other Ambulatory Visit: Payer: Self-pay | Admitting: Family Medicine

## 2023-01-25 ENCOUNTER — Other Ambulatory Visit: Payer: Self-pay | Admitting: Family Medicine

## 2023-02-11 DIAGNOSIS — E1159 Type 2 diabetes mellitus with other circulatory complications: Secondary | ICD-10-CM | POA: Diagnosis not present

## 2023-02-11 DIAGNOSIS — I10 Essential (primary) hypertension: Secondary | ICD-10-CM | POA: Diagnosis not present

## 2023-02-12 LAB — BMP8+EGFR
BUN/Creatinine Ratio: 14 (ref 12–28)
BUN: 21 mg/dL (ref 8–27)
CO2: 23 mmol/L (ref 20–29)
Calcium: 9 mg/dL (ref 8.7–10.3)
Chloride: 99 mmol/L (ref 96–106)
Creatinine, Ser: 1.52 mg/dL — ABNORMAL HIGH (ref 0.57–1.00)
Glucose: 158 mg/dL — ABNORMAL HIGH (ref 70–99)
Potassium: 4.2 mmol/L (ref 3.5–5.2)
Sodium: 136 mmol/L (ref 134–144)
eGFR: 36 mL/min/{1.73_m2} — ABNORMAL LOW (ref >59)

## 2023-02-12 LAB — HEMOGLOBIN A1C
Est. average glucose Bld gHb Est-mCnc: 192 mg/dL
Hgb A1c MFr Bld: 8.3 % — ABNORMAL HIGH (ref 4.8–5.6)

## 2023-02-12 LAB — TSH: TSH: 1.96 u[IU]/mL (ref 0.450–4.500)

## 2023-02-18 ENCOUNTER — Ambulatory Visit: Payer: Medicare HMO | Admitting: Family Medicine

## 2023-02-18 ENCOUNTER — Encounter: Payer: Self-pay | Admitting: Family Medicine

## 2023-02-18 ENCOUNTER — Encounter: Payer: Medicare HMO | Attending: Family Medicine | Admitting: Nutrition

## 2023-02-18 VITALS — BP 104/69 | HR 105 | Ht 66.0 in | Wt 218.1 lb

## 2023-02-18 VITALS — Ht 63.0 in | Wt 218.0 lb

## 2023-02-18 DIAGNOSIS — Z713 Dietary counseling and surveillance: Secondary | ICD-10-CM | POA: Insufficient documentation

## 2023-02-18 DIAGNOSIS — E538 Deficiency of other specified B group vitamins: Secondary | ICD-10-CM | POA: Diagnosis not present

## 2023-02-18 DIAGNOSIS — Z23 Encounter for immunization: Secondary | ICD-10-CM | POA: Diagnosis not present

## 2023-02-18 DIAGNOSIS — E1159 Type 2 diabetes mellitus with other circulatory complications: Secondary | ICD-10-CM

## 2023-02-18 DIAGNOSIS — E559 Vitamin D deficiency, unspecified: Secondary | ICD-10-CM

## 2023-02-18 DIAGNOSIS — E785 Hyperlipidemia, unspecified: Secondary | ICD-10-CM | POA: Diagnosis not present

## 2023-02-18 DIAGNOSIS — E1122 Type 2 diabetes mellitus with diabetic chronic kidney disease: Secondary | ICD-10-CM | POA: Insufficient documentation

## 2023-02-18 DIAGNOSIS — I129 Hypertensive chronic kidney disease with stage 1 through stage 4 chronic kidney disease, or unspecified chronic kidney disease: Secondary | ICD-10-CM | POA: Diagnosis not present

## 2023-02-18 DIAGNOSIS — N189 Chronic kidney disease, unspecified: Secondary | ICD-10-CM | POA: Diagnosis not present

## 2023-02-18 DIAGNOSIS — Z0001 Encounter for general adult medical examination with abnormal findings: Secondary | ICD-10-CM

## 2023-02-18 DIAGNOSIS — I10 Essential (primary) hypertension: Secondary | ICD-10-CM

## 2023-02-18 MED ORDER — TIRZEPATIDE 7.5 MG/0.5ML ~~LOC~~ SOAJ: 7.5000 mg | SUBCUTANEOUS | 0 refills | Status: DC

## 2023-02-18 MED ORDER — TIRZEPATIDE 5 MG/0.5ML ~~LOC~~ SOAJ: 5.0000 mg | SUBCUTANEOUS | 0 refills | Status: DC

## 2023-02-18 NOTE — Patient Instructions (Signed)
F/U in 7 weeks re evaluate blood sugar, call if  you need me soon er  Flu vaccine today   New in place of trulicity is once weekly mounjaro for your diabetes, let us know if unable to afford please  Follow eating plan as discussed with educator, glad that you see the benefit  Start this Sunday with the 5mg  once weekly dose  Then in 4 weeks you will go up to the 7.5 mg weekly dose  Good foot exam  It is important that you exercise regularly at least 30 minutes 5 times a week. If you develop chest pain, have severe difficulty breathing, or feel very tired, stop exercising immediately and seek medical attention   Think about what you will eat, plan ahead. Choose " clean, green, fresh or frozen" over canned, processed or packaged foods which are more sugary, salty and fatty. 70 to 75% of food eaten should be vegetables and fruit. Three meals at set times with snacks allowed between meals, but they must be fruit or vegetables. Aim to eat over a 12 hour period , example 7 am to 7 pm, and STOP after  your last meal of the day.  Thanks for choosing [facility], we consider it a privelige to serve you.  Drink water,generally about 64 ounces per day, no other drink is as healthy. Fruit juice is best enjoyed in a healthy way, by EATING the fruit.

## 2023-02-18 NOTE — Patient Instructions (Signed)
Goals Cut out soda Drink 64 oz of water per day

## 2023-02-18 NOTE — Assessment & Plan Note (Signed)
Annual exam as documented. . Immunization and cancer screening needs are specifically addressed at this visit.  

## 2023-02-18 NOTE — Assessment & Plan Note (Signed)
Helen Mahoney is reminded of the importance of commitment to daily physical activity for 30 minutes or more, as able and the need to limit carbohydrate intake to 30 to 60 grams per meal to help with blood sugar control.   The need to take medication as prescribed, test blood sugar as directed, and to call between visits if there is a concern that blood sugar is uncontrolled is also discussed.   Helen Mahoney is reminded of the importance of daily foot exam, annual eye examination, and good blood sugar, blood pressure and cholesterol control. Slight improvement but not at goal Change to [facility] and stop trulicity, re eval in 6 to 8 weeeeks     Latest Ref Rng & Units 02/11/2023    8:13 AM 12/06/2022   10:51 AM 10/15/2022    9:08 AM 07/11/2022   11:02 AM 02/12/2022   11:30 AM  Diabetic Labs  HbA1c 4.8 - 5.6 % 8.3   8.4  7.5  10.2   Micro/Creat Ratio 0 - 29 mg/g creat     6   Chol 100 - 199 mg/dL   161   096   HDL >04 mg/dL   50   53   Calc LDL 0 - 99 mg/dL   48   50   Triglycerides 0 - 149 mg/dL   77   87   Creatinine 0.57 - 1.00 mg/dL 5.40  9.81  1.91  4.78  1.55       02/18/2023    1:18 PM 02/18/2023   10:14 AM 12/13/2022    9:27 AM 11/19/2022   11:03 AM 10/22/2022   11:05 AM 08/22/2022   10:55 AM 07/11/2022   10:21 AM  BP/Weight  Systolic BP  104 95  121 104 113  Diastolic BP  69 65  77 68 71  Wt. (Lbs) 218 218.12 224.43 221 227 228.12 235.08  BMI 38.62 kg/m2 35.21 kg/m2 36.22 kg/m2 35.67 kg/m2 36.64 kg/m2 36.82 kg/m2 37.94 kg/m2      Latest Ref Rng & Units 02/18/2023   10:20 AM 02/21/2022   12:00 AM  Foot/eye exam completion dates  Eye Exam No Retinopathy  No Retinopathy      Foot Form Completion  Done      This result is from an external source.

## 2023-02-18 NOTE — Assessment & Plan Note (Signed)
After obtaining informed consent, the vaccine is  administered , with no adverse effect noted at the time of administration.  

## 2023-02-18 NOTE — Progress Notes (Signed)
Medical Nutrition Therapy  Appointment Start time:  1300 Appointment End time:  1330  Primary concerns today: Obesity and DM Type 2, CKD Referral diagnosis: E11.8, E66.9, N18.3 Preferred learning style: No preference.  Learning readiness: Change in progress    NUTRITION ASSESSMENT   Lose 6 lbs. Saw Dr. Lodema Hong She was taken off Trulicity and put on [facility]. Still caring for great grandkids A1C 8.3%   Lab Results  Component Value Date   HGBA1C 8.3 (H) 02/11/2023    Anthropometrics  Wt Readings from Last 3 Encounters:  02/18/23 218 lb 1.9 oz (98.9 kg)  12/13/22 224 lb 6.9 oz (101.8 kg)  11/19/22 221 lb (100.2 kg)   Ht Readings from Last 3 Encounters:  02/18/23 5\' 6"  (1.676 m)  12/13/22 5\' 6"  (1.676 m)  11/19/22 5\' 6"  (1.676 m)   There is no height or weight on file to calculate BMI. @BMIFA @ Facility age limit for growth %iles is 20 years. Facility age limit for growth %iles is 20 years.    Clinical Medical Hx: See chart Medications: Lantus 50 units at night, Glipizide Labs:  Lab Results  Component Value Date   HGBA1C 8.3 (H) 02/11/2023       Latest Ref Rng & Units 02/11/2023    8:13 AM 12/06/2022   10:51 AM 10/15/2022    9:08 AM  CMP  Glucose 70 - 99 mg/dL 629  71  528   BUN 8 - 27 mg/dL 21  32  27   Creatinine 0.57 - 1.00 mg/dL 4.13  2.44  0.10   Sodium 134 - 144 mmol/L 136  135  140   Potassium 3.5 - 5.2 mmol/L 4.2  4.1  4.6   Chloride 96 - 106 mmol/L 99  103  104   CO2 20 - 29 mmol/L 23  24  22    Calcium 8.7 - 10.3 mg/dL 9.0  8.6  9.4   Total Protein 6.5 - 8.1 g/dL  7.1  6.6   Total Bilirubin 0.3 - 1.2 mg/dL  0.4  0.3   Alkaline Phos 38 - 126 U/L  110  145   AST 15 - 41 U/L  28  27   ALT 0 - 44 U/L  36  27    Lipid Panel     Component Value Date/Time   CHOL 114 10/15/2022 0908   TRIG 77 10/15/2022 0908   HDL 50 10/15/2022 0908   CHOLHDL 2.3 10/15/2022 0908   CHOLHDL 3.3 03/30/2019 0914   VLDL 29 11/04/2016 1033   LDLCALC 48 10/15/2022 0908    LDLCALC 106 (H) 03/30/2019 0914   LABVLDL 16 10/15/2022 0908    Notable Signs/Symptoms: Tired, craves sweets  Lifestyle & Dietary Hx Lives by herself. Cooks at home and eats out.  Estimated daily fluid intake: 30 oz Supplements:  Sleep: 6-7  Stress / self-care:  Current average weekly physical activity: ADL  24-Hr Dietary Recall First Meal: egg mcmuffin,water  Snack:  Second Baked chicken and boccoli and cheese, water Snack:  Third Meal:  crab meat, water, cheerries,water Snack: nabs Beverages: water  Estimated Energy Needs Calories: 1400 Carbohydrate: 158g Protein: 105g Fat: 39g   NUTRITION DIAGNOSIS  NB-1.1 Food and nutrition-related knowledge deficit As related to Diabetes Type 2.  As evidenced by A1C 10.2%.   NUTRITION INTERVENTION  Nutrition education (E-1) on the following topics:  Nutrition and Diabetes education provided on My Plate, CHO counting, meal planning, portion sizes, timing of meals, avoiding snacks between meals unless  having a low blood sugar, target ranges for A1C and blood sugars, signs/symptoms and treatment of hyper/hypoglycemia, monitoring blood sugars, taking medications as prescribed, benefits of exercising 30 minutes per day and prevention of complications of DM.  Lifestyle Medicine  - Whole Food, Plant Predominant Nutrition is highly recommended: Eat Plenty of vegetables, Mushrooms, fruits, Legumes, Whole Grains, Nuts, seeds in lieu of processed meats, processed snacks/pastries red meat, poultry, eggs.    -It is better to avoid simple carbohydrates including: Cakes, Sweet Desserts, Ice Cream, Soda (diet and regular), Sweet Tea, Candies, Chips, Cookies, Store Bought Juices, Alcohol in Excess of  1-2 drinks a day, Lemonade,  Artificial Sweeteners, Doughnuts, Coffee Creamers, "Sugar-free" Products, etc, etc.  This is not a complete list.....  Exercise: If you are able: 30 -60 minutes a day ,4 days a week, or 150 minutes a week.  The longer the  better.  Combine stretch, strength, and aerobic activities.  If you were told in the past that you have high risk for cardiovascular diseases, you may seek evaluation by your heart doctor prior to initiating moderate to intense exercise programs.    Handouts Provided Include  Lifestyle Medicine Dexcom insturctions  Learning Style & Readiness for Change Teaching method utilized: Visual & Auditory  Demonstrated degree of understanding via: Teach Back  Barriers to learning/adherence to lifestyle change: None  Goals Established by Pt Goals Cut out soda Drink 64 oz of water per day  MONITORING & EVALUATION Dietary intake, weekly physical activity, and weight and BS in 3 month.  Next Steps  Patient is to work on eating more plant based foods and cut out chips and sodas.Marland Kitchen

## 2023-02-18 NOTE — Progress Notes (Signed)
Helen Mahoney     MRN: [id number]      DOB: 1950/06/13  Chief Complaint  Patient presents with   Annual Exam    CPE    HPI: Patient is in for annual physical exam. Diabetic management is changed Recent labs,  are reviewed. Immunization is reviewed , and  updated   PE: BP 104/69 (BP Location: Right Arm, Patient Position: Sitting, Cuff Size: Large)   Pulse (!) 105   Ht 5\' 6"  (1.676 m)   Wt 218 lb 1.9 oz (98.9 kg)   SpO2 97%   BMI 35.21 kg/m   Pleasant  female, alert and oriented x 3, in no cardio-pulmonary distress. Afebrile. HEENT No facial trauma or asymetry. Sinuses non tender.  Extra occullar muscles intact.. External ears normal, . Neck: supple, no adenopathy,JVD or thyromegaly.No bruits.  Chest: Clear to ascultation bilaterally.No crackles or wheezes. Non tender to palpation    Cardiovascular system; Heart sounds normal,  S1 and  S2 ,no S3.  No murmur, or thrill. Apical beat not displaced Peripheral pulses normal.  Abdomen: Soft, non tender,.    Musculoskeletal exam: Full ROM of spine, hips , shoulders and knees. No deformity ,swelling or crepitus noted. No muscle wasting or atrophy.   Neurologic: Cranial nerves 2 to 12 intact. Power, tone ,sensation and reflexes normal throughout. No disturbance in gait. No tremor.  Skin: Intact, no ulceration, erythema , scaling or rash noted. Pigmentation normal throughout  Psych; Normal mood and affect. Judgement and concentration normal   Assessment & Plan:  Annual visit for general adult medical examination with abnormal findings Annual exam as documented. Immunization and cancer screening needs are specifically addressed at this visit.   Type 2 diabetes mellitus with vascular disease (HCC) Ms. Clendenon is reminded of the importance of commitment to daily physical activity for 30 minutes or more, as able and the need to limit carbohydrate intake to 30 to 60 grams per meal to help with blood  sugar control.   The need to take medication as prescribed, test blood sugar as directed, and to call between visits if there is a concern that blood sugar is uncontrolled is also discussed.   Ms. Sarpong is reminded of the importance of daily foot exam, annual eye examination, and good blood sugar, blood pressure and cholesterol control. Slight improvement but not at goal Change to [facility] and stop trulicity, re eval in 6 to 8 weeeeks     Latest Ref Rng & Units 02/11/2023    8:13 AM 12/06/2022   10:51 AM 10/15/2022    9:08 AM 07/11/2022   11:02 AM 02/12/2022   11:30 AM  Diabetic Labs  HbA1c 4.8 - 5.6 % 8.3   8.4  7.5  10.2   Micro/Creat Ratio 0 - 29 mg/g creat     6   Chol 100 - 199 mg/dL   841   324   HDL >40 mg/dL   50   53   Calc LDL 0 - 99 mg/dL   48   50   Triglycerides 0 - 149 mg/dL   77   87   Creatinine 0.57 - 1.00 mg/dL 1.02  7.25  3.66  4.40  1.55       02/18/2023    1:18 PM 02/18/2023   10:14 AM 12/13/2022    9:27 AM 11/19/2022   11:03 AM 10/22/2022   11:05 AM 08/22/2022   10:55 AM 07/11/2022   10:21 AM  BP/Weight  Systolic  BP  104 95  121 104 113  Diastolic BP  69 65  77 68 71  Wt. (Lbs) 218 218.12 224.43 221 227 228.12 235.08  BMI 38.62 kg/m2 35.21 kg/m2 36.22 kg/m2 35.67 kg/m2 36.64 kg/m2 36.82 kg/m2 37.94 kg/m2      Latest Ref Rng & Units 02/18/2023   10:20 AM 02/21/2022   12:00 AM  Foot/eye exam completion dates  Eye Exam No Retinopathy  No Retinopathy      Foot Form Completion  Done      This result is from an external source.        Encounter for immunization After obtaining informed consent, the vaccine is  administered , with no adverse effect noted at the time of administration.

## 2023-02-25 ENCOUNTER — Other Ambulatory Visit: Payer: Self-pay | Admitting: Family Medicine

## 2023-02-27 DIAGNOSIS — H5213 Myopia, bilateral: Secondary | ICD-10-CM | POA: Diagnosis not present

## 2023-02-27 LAB — HM DIABETES EYE EXAM

## 2023-03-05 ENCOUNTER — Ambulatory Visit (HOSPITAL_COMMUNITY)
Admission: RE | Admit: 2023-03-05 | Discharge: 2023-03-05 | Disposition: A | Discharge status: Home / Self Care | Payer: Medicare HMO | Source: Ambulatory Visit | Attending: Family Medicine | Admitting: Family Medicine

## 2023-03-05 ENCOUNTER — Encounter (HOSPITAL_COMMUNITY): Payer: Self-pay

## 2023-03-05 DIAGNOSIS — Z1231 Encounter for screening mammogram for malignant neoplasm of breast: Secondary | ICD-10-CM | POA: Diagnosis not present

## 2023-03-10 ENCOUNTER — Other Ambulatory Visit: Payer: Self-pay | Admitting: Family Medicine

## 2023-03-16 ENCOUNTER — Other Ambulatory Visit: Payer: Self-pay | Admitting: Family Medicine

## 2023-03-19 ENCOUNTER — Other Ambulatory Visit: Payer: Self-pay | Admitting: Family Medicine

## 2023-03-25 ENCOUNTER — Other Ambulatory Visit: Payer: Self-pay

## 2023-03-25 MED ORDER — TRULICITY 4.5 MG/0.5ML ~~LOC~~ SOAJ: 4.5000 mg | SUBCUTANEOUS | 0 refills | Status: DC

## 2023-03-26 ENCOUNTER — Encounter: Payer: Self-pay | Admitting: Nutrition

## 2023-03-29 ENCOUNTER — Other Ambulatory Visit: Payer: Self-pay | Admitting: Family Medicine

## 2023-04-01 ENCOUNTER — Ambulatory Visit (INDEPENDENT_AMBULATORY_CARE_PROVIDER_SITE_OTHER): Payer: Medicare HMO | Admitting: *Deleted

## 2023-04-01 DIAGNOSIS — Z0000 Encounter for general adult medical examination without abnormal findings: Secondary | ICD-10-CM

## 2023-04-01 NOTE — Patient Instructions (Signed)
Helen Mahoney , Thank you for taking time to come for your Medicare Wellness Visit. I appreciate your ongoing commitment to your health goals. Please review the following plan we discussed and let me know if I can assist you in the future.   Screening recommendations/referrals: Colonoscopy: up to date Mammogram: up tod ate Bone Density: up to date Recommended yearly ophthalmology/optometry visit for glaucoma screening and checkup Recommended yearly dental visit for hygiene and checkup  Vaccinations: Influenza vaccine: up to date Pneumococcal vaccine: up to date Tdap vaccine:  Shingles vaccine: up to date      Preventive Care 65 Years and Older, Female Preventive care refers to lifestyle choices and visits with your health care provider that can promote health and wellness. What does preventive care include? A yearly physical exam. This is also called an annual well check. Dental exams once or twice a year. Routine eye exams. Ask your health care provider how often you should have your eyes checked. Personal lifestyle choices, including: Daily care of your teeth and gums. Regular physical activity. Eating a healthy diet. Avoiding tobacco and drug use. Limiting alcohol use. Practicing safe sex. Taking low-dose aspirin every day. Taking vitamin and mineral supplements as recommended by your health care provider. What happens during an annual well check? The services and screenings done by your health care provider during your annual well check will depend on your age, overall health, lifestyle risk factors, and family history of disease. Counseling  Your health care provider may ask you questions about your: Alcohol use. Tobacco use. Drug use. Emotional well-being. Home and relationship well-being. Sexual activity. Eating habits. History of falls. Memory and ability to understand (cognition). Work and work Astronomer. Reproductive health. Screening  You may have the  following tests or measurements: Height, weight, and BMI. Blood pressure. Lipid and cholesterol levels. These may be checked every 5 years, or more frequently if you are over 45 years old. Skin check. Lung cancer screening. You may have this screening every year starting at age 25 if you have a 30-pack-year history of smoking and currently smoke or have quit within the past 15 years. Fecal occult blood test (FOBT) of the stool. You may have this test every year starting at age 94. Flexible sigmoidoscopy or colonoscopy. You may have a sigmoidoscopy every 5 years or a colonoscopy every 10 years starting at age 14. Hepatitis C blood test. Hepatitis B blood test. Sexually transmitted disease (STD) testing. Diabetes screening. This is done by checking your blood sugar (glucose) after you have not eaten for a while (fasting). You may have this done every 1-3 years. Bone density scan. This is done to screen for osteoporosis. You may have this done starting at age 35. Mammogram. This may be done every 1-2 years. Talk to your health care provider about how often you should have regular mammograms. Talk with your health care provider about your test results, treatment options, and if necessary, the need for more tests. Vaccines  Your health care provider may recommend certain vaccines, such as: Influenza vaccine. This is recommended every year. Tetanus, diphtheria, and acellular pertussis (Tdap, Td) vaccine. You may need a Td booster every 10 years. Zoster vaccine. You may need this after age 39. Pneumococcal 13-valent conjugate (PCV13) vaccine. One dose is recommended after age 60. Pneumococcal polysaccharide (PPSV23) vaccine. One dose is recommended after age 1. Talk to your health care provider about which screenings and vaccines you need and how often you need them. This information is  not intended to replace advice given to you by your health care provider. Make sure you discuss any questions you  have with your health care provider. Document Released: 05/12/2015 Document Revised: 01/03/2016 Document Reviewed: 02/14/2015 Elsevier Interactive Patient Education  2017 [employer].  Fall Prevention in the Home Falls can cause injuries. They can happen to people of all ages. There are many things you can do to make your home safe and to help prevent falls. What can I do on the outside of my home? Regularly fix the edges of walkways and driveways and fix any cracks. Remove anything that might make you trip as you walk through a door, such as a raised step or threshold. Trim any bushes or trees on the path to your home. Use bright outdoor lighting. Clear any walking paths of anything that might make someone trip, such as rocks or tools. Regularly check to see if handrails are loose or broken. Make sure that both sides of any steps have handrails. Any raised decks and porches should have guardrails on the edges. Have any leaves, snow, or ice cleared regularly. Use sand or salt on walking paths during winter. Clean up any spills in your garage right away. This includes oil or grease spills. What can I do in the bathroom? Use night lights. Install grab bars by the toilet and in the tub and shower. Do not use towel bars as grab bars. Use non-skid mats or decals in the tub or shower. If you need to sit down in the shower, use a plastic, non-slip stool. Keep the floor dry. Clean up any water that spills on the floor as soon as it happens. Remove soap buildup in the tub or shower regularly. Attach bath mats securely with double-sided non-slip rug tape. Do not have throw rugs and other things on the floor that can make you trip. What can I do in the bedroom? Use night lights. Make sure that you have a light by your bed that is easy to reach. Do not use any sheets or blankets that are too big for your bed. They should not hang down onto the floor. Have a firm chair that has side arms. You can  use this for support while you get dressed. Do not have throw rugs and other things on the floor that can make you trip. What can I do in the kitchen? Clean up any spills right away. Avoid walking on wet floors. Keep items that you use a lot in easy-to-reach places. If you need to reach something above you, use a strong step stool that has a grab bar. Keep electrical cords out of the way. Do not use floor polish or wax that makes floors slippery. If you must use wax, use non-skid floor wax. Do not have throw rugs and other things on the floor that can make you trip. What can I do with my stairs? Do not leave any items on the stairs. Make sure that there are handrails on both sides of the stairs and use them. Fix handrails that are broken or loose. Make sure that handrails are as long as the stairways. Check any carpeting to make sure that it is firmly attached to the stairs. Fix any carpet that is loose or worn. Avoid having throw rugs at the top or bottom of the stairs. If you do have throw rugs, attach them to the floor with carpet tape. Make sure that you have a light switch at the top of the  stairs and the bottom of the stairs. If you do not have them, ask someone to add them for you. What else can I do to help prevent falls? Wear shoes that: Do not have high heels. Have rubber bottoms. Are comfortable and fit you well. Are closed at the toe. Do not wear sandals. If you use a stepladder: Make sure that it is fully opened. Do not climb a closed stepladder. Make sure that both sides of the stepladder are locked into place. Ask someone to hold it for you, if possible. Clearly mark and make sure that you can see: Any grab bars or handrails. First and last steps. Where the edge of each step is. Use tools that help you move around (mobility aids) if they are needed. These include: Canes. Walkers. Scooters. Crutches. Turn on the lights when you go into a dark area. Replace any light  bulbs as soon as they burn out. Set up your furniture so you have a clear path. Avoid moving your furniture around. If any of your floors are uneven, fix them. If there are any pets around you, be aware of where they are. Review your medicines with your doctor. Some medicines can make you feel dizzy. This can increase your chance of falling. Ask your doctor what other things that you can do to help prevent falls. This information is not intended to replace advice given to you by your health care provider. Make sure you discuss any questions you have with your health care provider. Document Released: 02/09/2009 Document Revised: 09/21/2015 Document Reviewed: 05/20/2014 Elsevier Interactive Patient Education  2017 [employer].

## 2023-04-01 NOTE — Progress Notes (Signed)
Subjective:   Helen Mahoney is a 72 y.o. female who presents for Medicare Annual (Subsequent) preventive examination.  Visit Complete: Virtual I connected with  [place] on 04/01/23 by a audio enabled telemedicine application and verified that I am speaking with the correct person using two identifiers.  Patient Location: Home  Provider Location: Home Office  I discussed the limitations of evaluation and management by telemedicine. The patient expressed understanding and agreed to proceed.  Vital Signs: Because this visit was a virtual/telehealth visit, some criteria may be missing or patient reported. Any vitals not documented were not able to be obtained and vitals that have been documented are patient reported.   Cardiac Risk Factors include: advanced age (>4men, >1 women);diabetes mellitus;hypertension     Objective:    There were no vitals filed for this visit. There is no height or weight on file to calculate BMI.     04/01/2023   11:33 AM 12/13/2022    9:27 AM 06/19/2022    8:22 AM 06/19/2022    8:20 AM 05/23/2022    2:34 PM 11/09/2021   10:00 AM 05/10/2021   11:03 AM  Advanced Directives  Does Patient Have a Medical Advance Directive? No No No No No No No  Would patient like information on creating a medical advance directive? No - Patient declined No - Patient declined No - Patient declined No - Patient declined No - Patient declined No - Patient declined No - Patient declined    Current Medications (verified) Outpatient Encounter Medications as of 04/01/2023  Medication Sig   Alcohol Swabs (DROPSAFE ALCOHOL PREP) 70 % PADS USE AS DIRECTED THREE TIMES DAILY   aspirin 81 MG tablet Take 81 mg by mouth daily.   Blood Glucose Calibration (TRUE METRIX LEVEL 1) Low SOLN USE AS DIRECTED AS NEEDED   blood glucose meter kit and supplies Dispense based on patient and insurance preference. Once daily testing DX E11.9   Cholecalciferol (VITAMIN D3) 2000 units  TABS Take 2,000 Units by mouth daily.   Continuous Blood Gluc Receiver (DEXCOM G7 RECEIVER) DEVI Use to monitor blood sugar daily.   Continuous Glucose Sensor (DEXCOM G7 SENSOR) MISC USE TO CHECK GLUCOSE DAILY,CHANGE SENSOR EVERY 10 DAYS   DROPLET PEN NEEDLES 31G X 8 MM MISC USE EVERY DAY AS DIRECTED.   Dulaglutide (TRULICITY) 4.5 MG/0.5ML SOAJ Inject 4.5 mg into the muscle once a week. INJECT 4.5MG  (0.5ML) UNDER THE SKIN ONCE A WEEK   ezetimibe (ZETIA) 10 MG tablet TAKE 1 TABLET EVERY DAY   ferrous sulfate 325 (65 FE) MG tablet Take 325 mg by mouth daily with breakfast.   folic acid (FOLVITE) 800 MCG tablet Take 800 mcg by mouth daily.   glipiZIDE (GLUCOTROL) 10 MG tablet Take 1 tablet (10 mg total) by mouth daily before breakfast.   glucose blood test strip Use as instructed   Lancets 30G MISC Once daily testing dx e11.9   LANTUS SOLOSTAR 100 UNIT/ML Solostar Pen INJECT 50 UNITS SUBCUTANEOUSLY ONCE DAILY AT  10  PM   lisinopril-hydrochlorothiazide (ZESTORETIC) 20-12.5 MG tablet TAKE 2 TABLETS ONE TIME DAILY FOR BLOOD PRESSURE (DOSE INCREASE)   MOUNJARO 5 MG/0.5ML Pen INJECT 5 MG INTO THE SKIN ONCE A WEEK   rosuvastatin (CRESTOR) 40 MG tablet TAKE 1 TABLET EVERY DAY   tirzepatide (MOUNJARO) 7.5 MG/0.5ML Pen Inject 7.5 mg into the skin once a week.   TRUE METRIX BLOOD GLUCOSE TEST test strip TEST BLOOD SUGAR THREE TIMES DAILY  TRUEplus Lancets 33G MISC USE TO TEST BLOOD SUGAR THREE TIMES DAILY   No facility-administered encounter medications on file as of 04/01/2023.    Allergies (verified) Tetanus toxoids and Penicillins   History: Past Medical History:  Diagnosis Date   Agatston coronary artery calcium score less than 100    51 on coronary CT 03/2021   Alpha-0- thalassemia trait/carrier 05/18/2015   2013: TCS/EGD 2017: TCS/EGD HYPERPLASTIC GASTRIC POLYPS, LYMPHOCYTIC GASTRITIS    B12 deficiency 06/16/2015   Colon polyps    Diabetes mellitus, type 2 (HCC)    Hyperlipidemia     Hypertension    Microcytic anemia 05/18/2015   2013: TCS/EGD 2017: TCS/EGD HYPERPLASTIC GASTRIC POLYPS, LYMPHOCYTIC GASTRITIS    Obesity    Past Surgical History:  Procedure Laterality Date   bilateral tubal ligation  1979   CHOLECYSTECTOMY  2001   ACUTE CHOLECYSTITIS/GALLSTONES   COLONOSCOPY  05/14/2011   Dr. Darrick Penna: sessile polyp in sigmoid colon, internal hemorrhoids, hyerplastic polyps   COLONOSCOPY N/A 05/15/2015   Procedure: COLONOSCOPY;  Surgeon: West Bali, MD;  [facility]: [facility];  Service: Endoscopy;  Laterality: N/A;  0830   COLONOSCOPY W/ POLYPECTOMY  NOV 2008 [facility] ANEMIA   POLYP NO RETRIEVED   COLONOSCOPY W/ POLYPECTOMY  2006 DR. SMTH   POLYP?   COLONOSCOPY WITH PROPOFOL N/A 08/07/2020   Procedure: COLONOSCOPY WITH PROPOFOL;  Surgeon: Lanelle Bal, DO;  [facility]: [facility];  Service: Endoscopy;  Laterality: N/A;  ASA II / AM procedure   ESOPHAGOGASTRODUODENOSCOPY  05/14/2011   Dr. Darrick Penna: sessile polyps in the cardia, mild gastritis. Chronic duodenitis consistent with peptic duodenitis, chronic active H.pylori gastritis.    ESOPHAGOGASTRODUODENOSCOPY N/A 05/15/2015   Procedure: ESOPHAGOGASTRODUODENOSCOPY (EGD);  Surgeon: West Bali, MD;  [facility]: [facility];  Service: Endoscopy;  Laterality: N/A;   GIVENS CAPSULE STUDY N/A 06/02/2015   Procedure: GIVENS CAPSULE STUDY;  Surgeon: West Bali, MD;  [facility]: [facility];  Service: Endoscopy;  Laterality: N/A;  0700   POLYPECTOMY  08/07/2020   Procedure: POLYPECTOMY;  Surgeon: Lanelle Bal, DO;  [facility]: [facility];  Service: Endoscopy;;   TUBAL LIGATION     UPPER GASTROINTESTINAL ENDOSCOPY  NOV 2008 [facility] ANEMIA   NL EXAM, urease neg   Family History  Problem Relation Age of Onset   Heart disease Mother    Diabetes Mother    Hypertension Mother    Stroke Mother    Breast cancer Mother    Diabetes Father    Heart attack Father    Kidney disease Father    Hypertension Sister    Diabetes  Sister    Diabetes Sister    Mental illness Sister    Hypertension Sister    Hepatitis C Sister    Mental illness Sister    Diabetes Sister    Hypertension Sister    Mental illness Sister    Colon cancer Sister 17   Diabetes Brother    Social History   Socioeconomic History   Marital status: Married    Spouse name: Not on file   Number of children: Not on file   Years of education: Not on file   Highest education level: Not on file  Occupational History   Not on file  Tobacco Use   Smoking status: Never   Smokeless tobacco: Never  Vaping Use   Vaping status: Never Used  Substance and Sexual Activity   Alcohol use: No   Drug use: No  Sexual activity: Not Currently  Other Topics Concern   Not on file  Social History Narrative   Not on file   Social Determinants of Health   Financial Resource Strain: Low Risk  (04/01/2023)   Overall Financial Resource Strain (CARDIA)    Difficulty of Paying Living Expenses: Not hard at all  Food Insecurity: No Food Insecurity (04/01/2023)   Hunger Vital Sign    Worried About Running Out of Food in the Last Year: Never true    Ran Out of Food in the Last Year: Never true  Transportation Needs: No Transportation Needs (04/01/2023)   PRAPARE - Administrator, Civil Service (Medical): No    Lack of Transportation (Non-Medical): No  Physical Activity: Inactive (04/01/2023)   Exercise Vital Sign    Days of Exercise per Week: 0 days    Minutes of Exercise per Session: 0 min  Stress: No Stress Concern Present (04/01/2023)   [employer] of Occupational Health - Occupational Stress Questionnaire    Feeling of Stress : Not at all  Social Connections: Socially Integrated (04/01/2023)   Social Connection and Isolation Panel [NHANES]    Frequency of Communication with Friends and Family: More than three times a week    Frequency of Social Gatherings with Friends and Family: More than three times a week    Attends Religious  Services: More than 4 times per year    Active Member of [employer] or Organizations: Yes    Attends Banker Meetings: 1 to 4 times per year    Marital Status: Married    Tobacco Counseling Counseling given: Not Answered   Clinical Intake:  Pre-visit preparation completed: Yes  Pain : No/denies pain     Diabetes: Yes CBG done?: No Did pt. bring in CBG monitor from home?: No  How often do you need to have someone help you when you read instructions, pamphlets, or other written materials from your doctor or pharmacy?: 1 - Never  Interpreter Needed?: No  Information entered by :: Remi Haggard LPN   Activities of Daily Living    04/01/2023   11:34 AM  In your present state of health, do you have any difficulty performing the following activities:  Hearing? 0  Vision? 0  Difficulty concentrating or making decisions? 0  Walking or climbing stairs? 0  Dressing or bathing? 0  Doing errands, shopping? 0  Preparing Food and eating ? N  Using the Toilet? N  In the past six months, have you accidently leaked urine? N  Do you have problems with loss of bowel control? N  Managing your Medications? N  Managing your Finances? N  Housekeeping or managing your Housekeeping? N    Patient Care Team: Kerri Perches, MD as PCP - General Fields, Darleene Cleaver, MD (Inactive) as Consulting Physician (Gastroenterology) Galen Manila, Novella Olive, MD (Inactive) as Consulting Physician (Hematology and Oncology) Lanelle Bal, DO as Consulting Physician (Internal Medicine) Gavin Pound, [facility] (Inactive) (Pharmacist)  Indicate any recent Medical Services you may have received from other than [facility] providers in the past year (date may be approximate).     Assessment:   This is a routine wellness examination for [place].  Hearing/Vision screen Hearing Screening - Comments:: No trouble hearing Vision Screening - Comments:: Up to date [facility]   Goals Addressed              This Visit's Progress    Weight (lb) < 200 lb (90.7  kg)         Depression Screen    04/01/2023   11:36 AM 02/18/2023   10:16 AM 10/22/2022   11:06 AM 08/22/2022   10:57 AM 07/11/2022   10:22 AM 06/19/2022    1:01 PM 05/17/2022   11:19 AM  PHQ 2/9 Scores  PHQ - 2 Score 0 0 0 0 0 0 0  PHQ- 9 Score 0   0 0  4    Fall Risk    04/01/2023   11:33 AM 02/18/2023   10:16 AM 10/22/2022   11:06 AM 08/22/2022   10:57 AM 07/11/2022   10:22 AM  Fall Risk   Falls in the past year? 0 0 0 0 0  Number falls in past yr: 0 0 0 0 0  Injury with Fall? 0 0 0 0 0  Risk for fall due to :  No Fall Risks No Fall Risks No Fall Risks No Fall Risks  Follow up Falls evaluation completed;Education provided;Falls prevention discussed Falls evaluation completed Falls evaluation completed Falls evaluation completed Falls evaluation completed    MEDICARE RISK AT HOME: Medicare Risk at Home Any stairs in or around the home?: Yes If so, are there any without handrails?: No Home free of loose throw rugs in walkways, pet beds, electrical cords, etc?: Yes Adequate lighting in your home to reduce risk of falls?: Yes Use of a cane, walker or w/c?: No Grab bars in the bathroom?: No Shower chair or bench in shower?: No Elevated toilet seat or a handicapped toilet?: No  TIMED UP AND GO:  Was the test performed?  No    Cognitive Function:    12/28/2019    1:16 PM  MMSE - Mini Mental State Exam  Not completed: Unable to complete        04/01/2023   11:36 AM 02/18/2022    1:52 PM 12/28/2020    2:32 PM 12/28/2019    1:16 PM 12/02/2018    8:39 AM  6CIT Screen  What Year? 0 points 0 points 0 points 0 points 0 points  What month? 0 points 0 points 0 points 0 points 0 points  What time? 0 points 0 points 0 points 0 points 0 points  Count back from 20 0 points 0 points 0 points 0 points 0 points  Months in reverse 0 points 0 points 0 points 0 points 0 points  Repeat phrase  0 points 0 points 0 points 0  points  Total Score  0 points 0 points 0 points 0 points    Immunizations Immunization History  Administered Date(s) Administered   Fluad Quad(high Dose 65+) 01/11/2019, 01/03/2021, 02/12/2022   Fluad Trivalent(High Dose 65+) 02/18/2023   Influenza Split 02/14/2011, 03/18/2012   Influenza Whole 02/03/2007, 01/25/2008, 01/20/2009, 01/04/2010   Influenza, High Dose Seasonal PF 07/01/2018   Influenza,inj,Quad PF,6+ Mos 02/18/2013, 03/14/2014, 01/12/2015, 02/22/2016, 01/27/2017, 12/18/2017, 12/16/2019   Moderna Sars-Covid-2 Vaccination 06/11/2019, 07/09/2019, 02/21/2020, 08/22/2020   Pneumococcal Conjugate-13 07/13/2014   Pneumococcal Polysaccharide-23 09/14/2009, 09/11/2015   Zoster Recombinant(Shingrix) 10/23/2021, 12/24/2021   Zoster, Live 10/16/2010    Tdap  allergy to vaccine   Flu Vaccine status: Up to date  Pneumococcal vaccine status: Up to date  Covid-19 vaccine status: Information provided on how to obtain vaccines.   Qualifies for Shingles Vaccine? No   Zostavax completed Yes   Shingrix Completed?: Yes  Screening Tests Health Maintenance  Topic Date Due   Diabetic kidney evaluation - Urine ACR  07/29/2023 (Originally  02/13/2023)   HEMOGLOBIN A1C  08/12/2023   Diabetic kidney evaluation - eGFR measurement  02/11/2024   FOOT EXAM  02/18/2024   OPHTHALMOLOGY EXAM  02/27/2024   Medicare Annual Wellness (AWV)  03/31/2024   MAMMOGRAM  03/04/2025   Colonoscopy  08/07/2025   Pneumonia Vaccine 68+ Years old  Completed   INFLUENZA VACCINE  Completed   DEXA SCAN  Completed   Hepatitis C Screening  Completed   Zoster Vaccines- Shingrix  Completed   HPV VACCINES  Aged Out   DTaP/Tdap/Td  Discontinued   COVID-19 Vaccine  Discontinued    Health Maintenance  There are no preventive care reminders to display for this patient.   Colorectal cancer screening: Type of screening: Colonoscopy. Completed 2022. Repeat every 5 years  Mammogram status: Completed  . Repeat  every year  Bone Density status: Completed 2017. Results reflect: Bone density results: NORMAL. Repeat every 0 years.  Lung Cancer Screening: (Low Dose CT Chest recommended if Age 1-80 years, 20 pack-year currently smoking OR have quit w/in 15years.) does not qualify.   Lung Cancer Screening Referral:   Additional Screening:  Hepatitis C Screening: does not qualify; Completed 2024  Vision Screening: Recommended annual ophthalmology exams for early detection of glaucoma and other disorders of the eye. Is the patient up to date with their annual eye exam?  Yes  Who is the provider or what is the name of the office in which the patient attends annual eye exams? [facility] If pt is not established with a provider, would they like to be referred to a provider to establish care? No .   Dental Screening: Recommended annual dental exams for proper oral hygiene  Nutrition Risk Assessment:  Has the patient had any N/V/D within the last 2 months?  No  Does the patient have any non-healing wounds?  No  Has the patient had any unintentional weight loss or weight gain?  No   Diabetes:  Is the patient diabetic?  Yes  If diabetic, was a CBG obtained today?  No  Did the patient bring in their glucometer from home?  No  How often do you monitor your CBG's? dexcom.   Financial Strains and Diabetes Management:  Are you having any financial strains with the device, your supplies or your medication? No .  Does the patient want to be seen by Chronic Care Management for management of their diabetes?  No  Would the patient like to be referred to a Nutritionist or for Diabetic Management?  No   Diabetic Exams:  Diabetic Eye Exam: . Pt has been advised about the importance in completing this exam. A referral has been placed today.   Diabetic Foot Exam: Pt has been advised about the importance in completing this exam.    Community Resource Referral / Chronic Care Management: CRR required this  visit?  No   CCM required this visit?  No     Plan:     I have personally reviewed and noted the following in the patient's chart:   Medical and social history Use of alcohol, tobacco or illicit drugs  Current medications and supplements including opioid prescriptions. Patient is not currently taking opioid prescriptions. Functional ability and status Nutritional status Physical activity Advanced directives List of other physicians Hospitalizations, surgeries, and ER visits in previous 12 months Vitals Screenings to include cognitive, depression, and falls Referrals and appointments  In addition, I have reviewed and discussed with patient certain preventive protocols, quality metrics,  and best practice recommendations. A written personalized care plan for preventive services as well as general preventive health recommendations were provided to patient.     Remi Haggard, LPN   [id number]/12/8117   After Visit Summary: (MyChart) Due to this being a telephonic visit, the after visit summary with patients personalized plan was offered to patient via MyChart   Nurse Notes:

## 2023-04-02 ENCOUNTER — Other Ambulatory Visit: Payer: Self-pay | Admitting: Family Medicine

## 2023-04-08 ENCOUNTER — Encounter: Payer: Self-pay | Admitting: Family Medicine

## 2023-04-08 ENCOUNTER — Ambulatory Visit (INDEPENDENT_AMBULATORY_CARE_PROVIDER_SITE_OTHER): Payer: Medicare HMO | Admitting: Family Medicine

## 2023-04-08 VITALS — BP 109/69 | HR 97 | Ht 66.0 in | Wt 221.0 lb

## 2023-04-08 DIAGNOSIS — E1159 Type 2 diabetes mellitus with other circulatory complications: Secondary | ICD-10-CM

## 2023-04-08 DIAGNOSIS — I10 Essential (primary) hypertension: Secondary | ICD-10-CM | POA: Diagnosis not present

## 2023-04-08 DIAGNOSIS — E1122 Type 2 diabetes mellitus with diabetic chronic kidney disease: Secondary | ICD-10-CM

## 2023-04-08 DIAGNOSIS — E785 Hyperlipidemia, unspecified: Secondary | ICD-10-CM | POA: Diagnosis not present

## 2023-04-08 NOTE — Progress Notes (Signed)
Helen Mahoney     MRN: [id number]      DOB: 02/08/1951  Chief Complaint  Patient presents with   Follow-up    Follow up    HPI [name] is here for follow up and re-evaluation of chronic medical conditions, in particular uncontrolled diabetes Has her log with 3 times daily testing fasting , post lunch and at bedtime, generally numbers look good, occasionally low in the 60's and high over 200 Also seeing nutritionist Active caring for toddler No concerns Denies polyuria, polydipsia, blurred vision , or hypoglycemic episodes.   ROS Denies recent fever or chills. Denies sinus pressure, nasal congestion, ear pain or sore throat. Denies chest congestion, productive cough or wheezing. Denies chest pains, palpitations and leg swelling Denies abdominal pain, nausea, vomiting,diarrhea or constipation.   Denies dysuria, frequency, hesitancy or incontinence. Denies joint pain, swelling and limitation in mobility. Denies headaches, seizures, numbness, or tingling. Denies depression, anxiety or insomnia. Denies skin break down or rash.   PE  BP 109/69 (BP Location: Right Arm, Patient Position: Sitting, Cuff Size: Large)   Pulse 97   Ht 5\' 6"  (1.676 m)   Wt 221 lb (100.2 kg)   SpO2 95%   BMI 35.67 kg/m   Patient alert and oriented and in no cardiopulmonary distress.  HEENT: No facial asymmetry, EOMI,     Neck supple .  Chest: Clear to auscultation bilaterally.  CVS: S1, S2 no murmurs, no S3.Regular rate.  ABD: Soft non tender.   Ext: No edema  MS: Adequate ROM spine, shoulders, hips and knees.  Skin: Intact, no ulcerations or rash noted.  Psych: Good eye contact, normal affect. Memory intact not anxious or depressed appearing.  CNS: CN 2-12 intact, power,  normal throughout.no focal deficits noted.   Assessment & Plan  Diabetes mellitus with kidney complication, with long-term current use of insulin (HCC) Diabetes associated with hypertension,  hyperlipidemia, obesity, and CKD  [name] is reminded of the importance of commitment to daily physical activity for 30 minutes or more, as able and the need to limit carbohydrate intake to 30 to 60 grams per meal to help with blood sugar control.   The need to take medication as prescribed, test blood sugar as directed, and to call between visits if there is a concern that blood sugar is uncontrolled is also discussed.   [name] is reminded of the importance of daily foot exam, annual eye examination, and good blood sugar, blood pressure and cholesterol control.     Latest Ref Rng & Units 02/11/2023    8:13 AM 12/06/2022   10:51 AM 10/15/2022    9:08 AM 07/11/2022   11:02 AM 02/12/2022   11:30 AM  Diabetic Labs  HbA1c 4.8 - 5.6 % 8.3   8.4  7.5  10.2   Micro/Creat Ratio 0 - 29 mg/g creat     6   Chol 100 - 199 mg/dL   409   811   HDL >91 mg/dL   50   53   Calc LDL 0 - 99 mg/dL   48   50   Triglycerides 0 - 149 mg/dL   77   87   Creatinine 0.57 - 1.00 mg/dL 4.78  2.95  6.21  3.08  1.55       04/08/2023   10:46 AM 02/18/2023    1:18 PM 02/18/2023   10:14 AM 12/13/2022    9:27 AM 11/19/2022   11:03 AM 10/22/2022  11:05 AM 08/22/2022   10:55 AM  BP/Weight  Systolic BP 109  409 95  121 104  Diastolic BP 69  69 65  77 68  Wt. (Lbs) 221 218 218.12 224.43 221 227 228.12  BMI 35.67 kg/m2 38.62 kg/m2 35.21 kg/m2 36.22 kg/m2 35.67 kg/m2 36.64 kg/m2 36.82 kg/m2      Latest Ref Rng & Units 02/27/2023   12:00 AM 02/18/2023   10:20 AM  Foot/eye exam completion dates  Eye Exam No Retinopathy No Retinopathy       Foot Form Completion   Done     This result is from an external source.    Updated lab needed at/ before next visit.  Overall appears to be improved    Essential hypertension Controlled, no change in medication DASH diet and commitment to daily physical activity for a minimum of 30 minutes discussed and encouraged, as a part of hypertension management. The  importance of attaining a healthy weight is also discussed.     04/08/2023   10:46 AM 02/18/2023    1:18 PM 02/18/2023   10:14 AM 12/13/2022    9:27 AM 11/19/2022   11:03 AM 10/22/2022   11:05 AM 08/22/2022   10:55 AM  BP/Weight  Systolic BP 109  811 95  121 104  Diastolic BP 69  69 65  77 68  Wt. (Lbs) 221 218 218.12 224.43 221 227 228.12  BMI 35.67 kg/m2 38.62 kg/m2 35.21 kg/m2 36.22 kg/m2 35.67 kg/m2 36.64 kg/m2 36.82 kg/m2       Morbid obesity  Patient re-educated about  the importance of commitment to a  minimum of 150 minutes of exercise per week as able.  The importance of healthy food choices with portion control discussed, as well as eating regularly and within a 12 hour window most days. The need to choose "clean , green" food 50 to 75% of the time is discussed, as well as to make water the primary drink and set a goal of 64 ounces water daily.       04/08/2023   10:46 AM 02/18/2023    1:18 PM 02/18/2023   10:14 AM  Weight /BMI  Weight 221 lb 218 lb 218 lb 1.9 oz  Height 5\' 6"  (1.676 m) 5\' 3"  (1.6 m) 5\' 6"  (1.676 m)  BMI 35.67 kg/m2 38.62 kg/m2 35.21 kg/m2    Deteriorated needs to reduce caloric intake and develop exercise commitment  Hyperlipidemia LDL goal <70 Hyperlipidemia:Low fat diet discussed and encouraged.   Lipid Panel  Lab Results  Component Value Date   CHOL 114 10/15/2022   HDL 50 10/15/2022   LDLCALC 48 10/15/2022   TRIG 77 10/15/2022   CHOLHDL 2.3 10/15/2022     Controlled, no change in medication

## 2023-04-08 NOTE — Patient Instructions (Addendum)
F/U with meds week of Jan 20, call if you need me sooner  Blood sugar looks much better  Remember to eat on a regular schedule  It is important that you exercise regularly at least 30 minutes 5 times a week. If you develop chest pain, have severe difficulty breathing, or feel very tired, stop exercising immediately and seek medical attention    HBAiC , cmp and EGFr, lipids fasting Jan 16 or shortly after  Nurse pls send DISCONTINUE message to [facility] re trulicity , pt is NOT on this anymore  Best for 2025!  Thanks for choosing [facility], we consider it a privelige to serve you.

## 2023-04-16 IMAGING — CT CT CARDIAC CORONARY ARTERY CALCIUM SCORE
1 of 2 series · 6 of 20 positions shown, 8 images · non-contrast
Comparison: None.
COMPARISON: None.

Addendum:
EXAM:
OVER-READ INTERPRETATION  CT CHEST

The following report is an over-read performed by radiologist Dr.
Thor Christian Rensel [REDACTED] on 03/30/2021. This
over-read does not include interpretation of cardiac or coronary
anatomy or pathology. The coronary calcium score interpretation by
the cardiologist is attached.
CLINICAL DATA: cardiovascular disease risk stratification
Coronary Calcium Score
TECHNIQUE: A gated, non-contrast computed tomography scan of the heart was
performed using 3mm slice thickness. Axial images were analyzed on a
dedicated workstation. Calcium scoring of the coronary arteries was
performed using the Agatston method.

[Series 2: cascseq 3.0 b35f (id) · axial · 0.68mm/px · z∈[+1360,+1441]mm · 6 of 39 slices shown, 8 images]
[im 6/39  vessel]
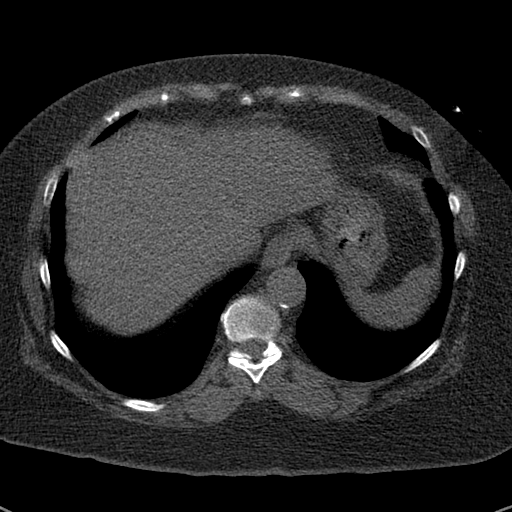
[im 6/39  lung]
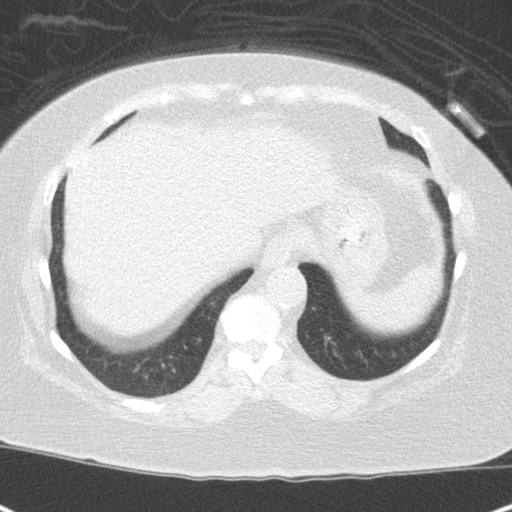
[im 11/39  vessel]
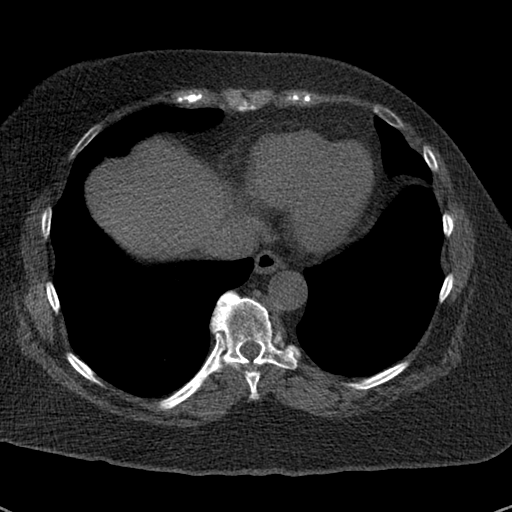
[im 17/39  vessel]
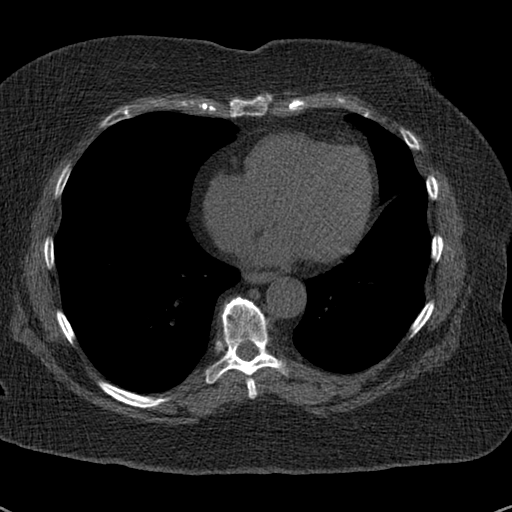
[im 22/39  vessel]
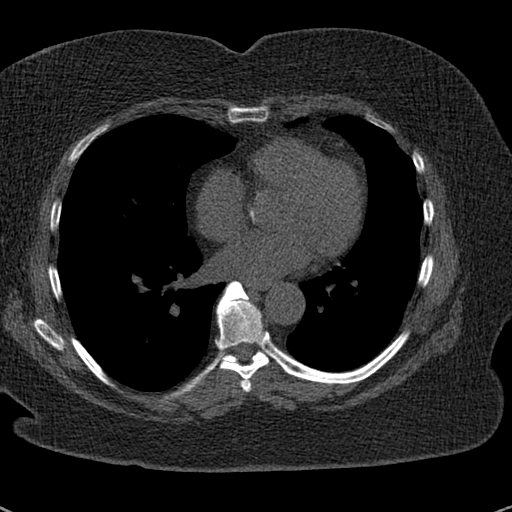
[im 28/39  vessel]
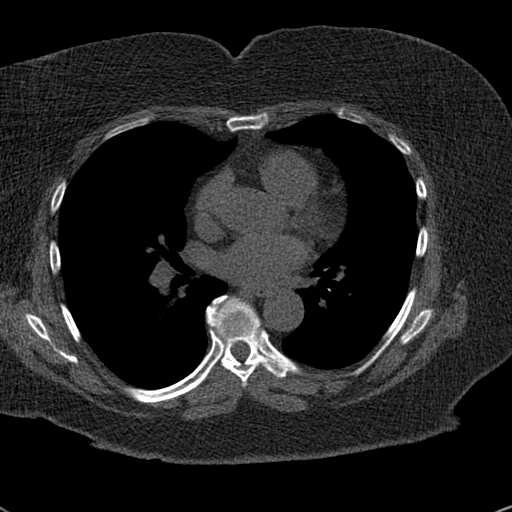
[im 28/39  lung]
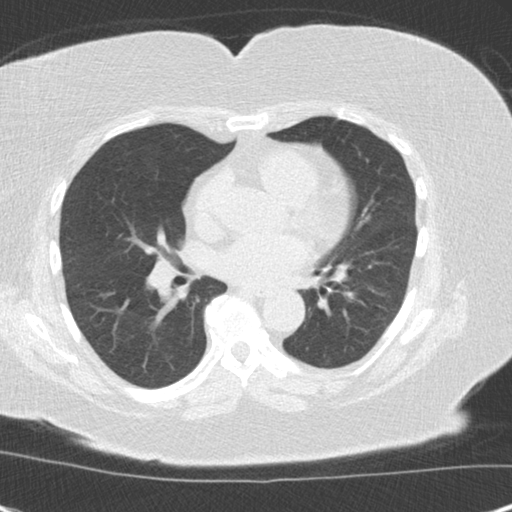
[im 33/39  vessel]
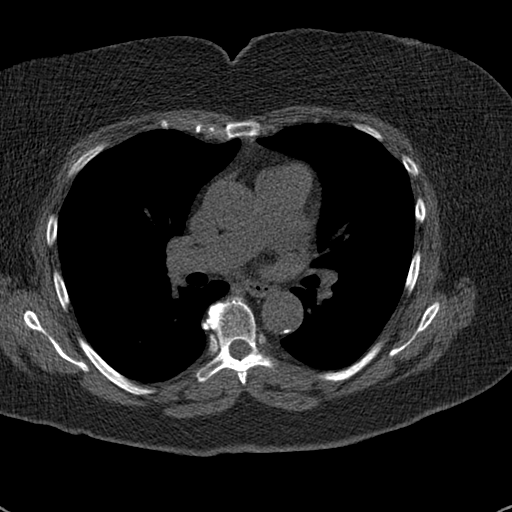

[6 of 20 positions shown; findings below may reference images not displayed]

FINDINGS: Atherosclerotic calcifications in the thoracic aorta. In the lateral
segment of the right middle lobe (axial image 22 of series 4) there
is a 10 x 8 mm (mean diameter of 9 mm) smoothly marginated pulmonary
nodule. Within the visualized portions of the thorax there is no
acute consolidative airspace disease, no pleural effusions, no
pneumothorax and no lymphadenopathy. Visualized portions of the
upper abdomen are unremarkable. There are no aggressive appearing
lytic or blastic lesions noted in the visualized portions of the
skeleton.
IMPRESSION: 1. Right middle lobe pulmonary nodule with a mean diameter of 9 mm.
Consider one of the following in 3 months for both low-risk and
high-risk individuals: (a) repeat chest CT or (b) follow-up PET-CT.
This recommendation follows the consensus statement: Guidelines for
Management of Incidental Pulmonary Nodules Detected on CT Images:
FINDINGS: Coronary arteries: Normal origins.

Coronary Calcium Score:

Left main:

Left anterior descending artery:

Left circumflex artery:

Right coronary artery: 0

Total:

Percentile: 71st

Pericardium: Normal.

Ascending Aorta: Normal caliber.  Aortic atherosclerosis.

Non-cardiac: See separate report from [REDACTED].
IMPRESSION: Coronary calcium score of 55.1. This was 71st percentile for age-,
race-, and sex-matched controls.



If CAC=0, it is reasonable to withhold statin therapy and reassess
in 5 to 10 years, as long as higher risk conditions are absent
(diabetes mellitus, family history of premature CHD in first degree
relatives (males <55 years; females <65 years), cigarette smoking,
or LDL >=190 mg/dL).

If CAC is 1 to 99, it is reasonable to initiate statin therapy for
patients >=55 years of age.

If CAC is >=100 or >=75th percentile, it is reasonable to initiate
statin therapy at any age.

Cardiology referral should be considered for patients with CAC
scores >=400 or >=75th percentile.

*2772 AHA/ACC/AACVPR/AAPA/ABC/CAPPU/HERMANN-JOSEF/DEFOIX/Jim/FALZAN/TAYE/JALMARI
Guideline on the Management of Blood Cholesterol: A Report of the
American College of Cardiology/American Heart Association Task Force
on Clinical Practice Guidelines. J Am Coll Cardiol.
3049;73(24):4568-4194.

*** End of Addendum ***
EXAM:
OVER-READ INTERPRETATION  CT CHEST

The following report is an over-read performed by radiologist Dr.
Thor Christian Rensel [REDACTED] on 03/30/2021. This
over-read does not include interpretation of cardiac or coronary
anatomy or pathology. The coronary calcium score interpretation by
the cardiologist is attached.
FINDINGS: Atherosclerotic calcifications in the thoracic aorta. In the lateral
segment of the right middle lobe (axial image 22 of series 4) there
is a 10 x 8 mm (mean diameter of 9 mm) smoothly marginated pulmonary
nodule. Within the visualized portions of the thorax there is no
acute consolidative airspace disease, no pleural effusions, no
pneumothorax and no lymphadenopathy. Visualized portions of the
upper abdomen are unremarkable. There are no aggressive appearing
lytic or blastic lesions noted in the visualized portions of the
skeleton.
IMPRESSION: 1. Right middle lobe pulmonary nodule with a mean diameter of 9 mm.
Consider one of the following in 3 months for both low-risk and
high-risk individuals: (a) repeat chest CT or (b) follow-up PET-CT.
This recommendation follows the consensus statement: Guidelines for
Management of Incidental Pulmonary Nodules Detected on CT Images:

## 2023-04-23 NOTE — Assessment & Plan Note (Signed)
Diabetes associated with hypertension, hyperlipidemia, obesity, and CKD  Helen Mahoney is reminded of the importance of commitment to daily physical activity for 30 minutes or more, as able and the need to limit carbohydrate intake to 30 to 60 grams per meal to help with blood sugar control.   The need to take medication as prescribed, test blood sugar as directed, and to call between visits if there is a concern that blood sugar is uncontrolled is also discussed.   Helen Mahoney is reminded of the importance of daily foot exam, annual eye examination, and good blood sugar, blood pressure and cholesterol control.     Latest Ref Rng & Units 02/11/2023    8:13 AM 12/06/2022   10:51 AM 10/15/2022    9:08 AM 07/11/2022   11:02 AM 02/12/2022   11:30 AM  Diabetic Labs  HbA1c 4.8 - 5.6 % 8.3   8.4  7.5  10.2   Micro/Creat Ratio 0 - 29 mg/g creat     6   Chol 100 - 199 mg/dL   161   096   HDL >04 mg/dL   50   53   Calc LDL 0 - 99 mg/dL   48   50   Triglycerides 0 - 149 mg/dL   77   87   Creatinine 0.57 - 1.00 mg/dL 5.40  9.81  1.91  4.78  1.55       04/08/2023   10:46 AM 02/18/2023    1:18 PM 02/18/2023   10:14 AM 12/13/2022    9:27 AM 11/19/2022   11:03 AM 10/22/2022   11:05 AM 08/22/2022   10:55 AM  BP/Weight  Systolic BP 109  295 95  121 104  Diastolic BP 69  69 65  77 68  Wt. (Lbs) 221 218 218.12 224.43 221 227 228.12  BMI 35.67 kg/m2 38.62 kg/m2 35.21 kg/m2 36.22 kg/m2 35.67 kg/m2 36.64 kg/m2 36.82 kg/m2      Latest Ref Rng & Units 02/27/2023   12:00 AM 02/18/2023   10:20 AM  Foot/eye exam completion dates  Eye Exam No Retinopathy No Retinopathy       Foot Form Completion   Done     This result is from an external source.    Updated lab needed at/ before next visit.  Overall appears to be improved

## 2023-04-23 NOTE — Assessment & Plan Note (Signed)
Controlled, no change in medication DASH diet and commitment to daily physical activity for a minimum of 30 minutes discussed and encouraged, as a part of hypertension management. The importance of attaining a healthy weight is also discussed.     04/08/2023   10:46 AM 02/18/2023    1:18 PM 02/18/2023   10:14 AM 12/13/2022    9:27 AM 11/19/2022   11:03 AM 10/22/2022   11:05 AM 08/22/2022   10:55 AM  BP/Weight  Systolic BP 109  841 95  121 104  Diastolic BP 69  69 65  77 68  Wt. (Lbs) 221 218 218.12 224.43 221 227 228.12  BMI 35.67 kg/m2 38.62 kg/m2 35.21 kg/m2 36.22 kg/m2 35.67 kg/m2 36.64 kg/m2 36.82 kg/m2

## 2023-04-23 NOTE — Assessment & Plan Note (Signed)
  Patient re-educated about  the importance of commitment to a  minimum of 150 minutes of exercise per week as able.  The importance of healthy food choices with portion control discussed, as well as eating regularly and within a 12 hour window most days. The need to choose "clean , green" food 50 to 75% of the time is discussed, as well as to make water the primary drink and set a goal of 64 ounces water daily.       04/08/2023   10:46 AM 02/18/2023    1:18 PM 02/18/2023   10:14 AM  Weight /BMI  Weight 221 lb 218 lb 218 lb 1.9 oz  Height 5\' 6"  (1.676 m) 5\' 3"  (1.6 m) 5\' 6"  (1.676 m)  BMI 35.67 kg/m2 38.62 kg/m2 35.21 kg/m2    Deteriorated needs to reduce caloric intake and develop exercise commitment

## 2023-04-23 NOTE — Assessment & Plan Note (Signed)
Hyperlipidemia:Low fat diet discussed and encouraged.   Lipid Panel  Lab Results  Component Value Date   CHOL 114 10/15/2022   HDL 50 10/15/2022   LDLCALC 48 10/15/2022   TRIG 77 10/15/2022   CHOLHDL 2.3 10/15/2022     Controlled, no change in medication

## 2023-04-28 DIAGNOSIS — R809 Proteinuria, unspecified: Secondary | ICD-10-CM | POA: Diagnosis not present

## 2023-04-28 DIAGNOSIS — E1129 Type 2 diabetes mellitus with other diabetic kidney complication: Secondary | ICD-10-CM | POA: Diagnosis not present

## 2023-04-28 DIAGNOSIS — D638 Anemia in other chronic diseases classified elsewhere: Secondary | ICD-10-CM | POA: Diagnosis not present

## 2023-04-28 DIAGNOSIS — E1122 Type 2 diabetes mellitus with diabetic chronic kidney disease: Secondary | ICD-10-CM | POA: Diagnosis not present

## 2023-04-28 DIAGNOSIS — N1832 Chronic kidney disease, stage 3b: Secondary | ICD-10-CM | POA: Diagnosis not present

## 2023-04-28 DIAGNOSIS — N189 Chronic kidney disease, unspecified: Secondary | ICD-10-CM | POA: Diagnosis not present

## 2023-05-01 ENCOUNTER — Other Ambulatory Visit: Payer: Self-pay | Admitting: Family Medicine

## 2023-05-06 DIAGNOSIS — E1122 Type 2 diabetes mellitus with diabetic chronic kidney disease: Secondary | ICD-10-CM | POA: Diagnosis not present

## 2023-05-06 DIAGNOSIS — N1832 Chronic kidney disease, stage 3b: Secondary | ICD-10-CM | POA: Diagnosis not present

## 2023-05-06 DIAGNOSIS — N2581 Secondary hyperparathyroidism of renal origin: Secondary | ICD-10-CM | POA: Diagnosis not present

## 2023-05-06 DIAGNOSIS — I5032 Chronic diastolic (congestive) heart failure: Secondary | ICD-10-CM | POA: Diagnosis not present

## 2023-05-06 DIAGNOSIS — I110 Hypertensive heart disease with heart failure: Secondary | ICD-10-CM | POA: Diagnosis not present

## 2023-05-06 DIAGNOSIS — D638 Anemia in other chronic diseases classified elsewhere: Secondary | ICD-10-CM | POA: Diagnosis not present

## 2023-05-16 DIAGNOSIS — I10 Essential (primary) hypertension: Secondary | ICD-10-CM | POA: Diagnosis not present

## 2023-05-16 DIAGNOSIS — E785 Hyperlipidemia, unspecified: Secondary | ICD-10-CM | POA: Diagnosis not present

## 2023-05-16 DIAGNOSIS — E1159 Type 2 diabetes mellitus with other circulatory complications: Secondary | ICD-10-CM | POA: Diagnosis not present

## 2023-05-17 LAB — CMP14+EGFR
ALT: 36 [IU]/L — ABNORMAL HIGH (ref 0–32)
AST: 30 [IU]/L (ref 0–40)
Albumin: 3.7 g/dL — ABNORMAL LOW (ref 3.8–4.8)
Alkaline Phosphatase: 118 [IU]/L (ref 44–121)
BUN/Creatinine Ratio: 17 (ref 12–28)
BUN: 26 mg/dL (ref 8–27)
Bilirubin Total: 0.3 mg/dL (ref 0.0–1.2)
CO2: 22 mmol/L (ref 20–29)
Calcium: 9.1 mg/dL (ref 8.7–10.3)
Chloride: 103 mmol/L (ref 96–106)
Creatinine, Ser: 1.55 mg/dL — ABNORMAL HIGH (ref 0.57–1.00)
Globulin, Total: 2.6 g/dL (ref 1.5–4.5)
Glucose: 59 mg/dL — ABNORMAL LOW (ref 70–99)
Potassium: 4.6 mmol/L (ref 3.5–5.2)
Sodium: 141 mmol/L (ref 134–144)
Total Protein: 6.3 g/dL (ref 6.0–8.5)
eGFR: 35 mL/min/{1.73_m2} — ABNORMAL LOW (ref >59)

## 2023-05-17 LAB — LIPID PANEL
Chol/HDL Ratio: 2.1 {ratio} (ref 0.0–4.4)
Cholesterol, Total: 98 mg/dL — ABNORMAL LOW (ref 100–199)
HDL: 46 mg/dL (ref >39)
LDL Chol Calc (NIH): 40 mg/dL (ref 0–99)
Triglycerides: 50 mg/dL (ref 0–149)
VLDL Cholesterol Cal: 12 mg/dL (ref 5–40)

## 2023-05-17 LAB — HEMOGLOBIN A1C
Est. average glucose Bld gHb Est-mCnc: 157 mg/dL
Hgb A1c MFr Bld: 7.1 % — ABNORMAL HIGH (ref 4.8–5.6)

## 2023-05-20 DIAGNOSIS — N1832 Chronic kidney disease, stage 3b: Secondary | ICD-10-CM | POA: Diagnosis not present

## 2023-05-20 NOTE — Progress Notes (Signed)
   Established Patient Office Visit   Subjective  Patient ID: Helen Mahoney, female    DOB: 03-25-1951  Age: 73 y.o. MRN: [id number]  No chief complaint on file.   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  HPI  ROS    Objective:     There were no vitals taken for this visit.   Physical Exam   No results found for any visits on 05/21/23.  The ASCVD Risk score (Arnett DK, et al., 2019) failed to calculate for the following reasons:   The valid total cholesterol range is 130 to 320 mg/dL    Assessment & Plan:  There are no diagnoses linked to this encounter.  No follow-ups on file.   Cruzita Lederer Newman Nip, FNP

## 2023-05-20 NOTE — Patient Instructions (Signed)

## 2023-05-21 ENCOUNTER — Ambulatory Visit (INDEPENDENT_AMBULATORY_CARE_PROVIDER_SITE_OTHER): Payer: Medicare HMO | Admitting: Family Medicine

## 2023-05-21 ENCOUNTER — Other Ambulatory Visit: Payer: Self-pay

## 2023-05-21 ENCOUNTER — Encounter: Payer: Self-pay | Admitting: Family Medicine

## 2023-05-21 ENCOUNTER — Other Ambulatory Visit: Payer: Self-pay | Admitting: Family Medicine

## 2023-05-21 ENCOUNTER — Encounter: Payer: Medicare HMO | Attending: Family Medicine | Admitting: Nutrition

## 2023-05-21 VITALS — BP 119/73 | HR 86 | Ht 66.0 in | Wt 215.0 lb

## 2023-05-21 VITALS — Ht 66.0 in | Wt 216.0 lb

## 2023-05-21 DIAGNOSIS — I10 Essential (primary) hypertension: Secondary | ICD-10-CM | POA: Diagnosis not present

## 2023-05-21 DIAGNOSIS — E1159 Type 2 diabetes mellitus with other circulatory complications: Secondary | ICD-10-CM

## 2023-05-21 DIAGNOSIS — N1832 Chronic kidney disease, stage 3b: Secondary | ICD-10-CM | POA: Diagnosis not present

## 2023-05-21 DIAGNOSIS — Z794 Long term (current) use of insulin: Secondary | ICD-10-CM | POA: Diagnosis not present

## 2023-05-21 DIAGNOSIS — E538 Deficiency of other specified B group vitamins: Secondary | ICD-10-CM | POA: Diagnosis not present

## 2023-05-21 DIAGNOSIS — E785 Hyperlipidemia, unspecified: Secondary | ICD-10-CM | POA: Insufficient documentation

## 2023-05-21 DIAGNOSIS — E1122 Type 2 diabetes mellitus with diabetic chronic kidney disease: Secondary | ICD-10-CM

## 2023-05-21 DIAGNOSIS — E559 Vitamin D deficiency, unspecified: Secondary | ICD-10-CM | POA: Insufficient documentation

## 2023-05-21 MED ORDER — LANTUS SOLOSTAR 100 UNIT/ML ~~LOC~~ SOPN: 50.0000 [IU] | PEN_INJECTOR | Freq: Every day | SUBCUTANEOUS | 1 refills | Status: DC

## 2023-05-21 NOTE — Progress Notes (Unsigned)
Medical Nutrition Therapy  Appointment Start time:  1100 Appointment End time:  1130  Primary concerns today: Obesity and DM Type 2, CKD Referral diagnosis: E11.8, E66.9, N18.3 Preferred learning style: No preference.  Learning readiness: Change in progress    NUTRITION ASSESSMENT   Saw Dr. Shon Hale today. On Mounjero  7.5 mgnow. Still on Glipizide. Lantus 50 units.   Still caring for great grandkids A1C 8.3%  Wt Readings from Last 3 Encounters:  05/21/23 216 lb (98 kg)  05/21/23 215 lb (97.5 kg)  04/08/23 221 lb (100.2 kg)   Ht Readings from Last 3 Encounters:  05/21/23 5\' 6"  (1.676 m)  05/21/23 5\' 6"  (1.676 m)  04/08/23 5\' 6"  (1.676 m)   Body mass index is 34.86 kg/m. @BMIFA @ Facility age limit for growth %iles is 20 years. Facility age limit for growth %iles is 20 years.  Lab Results  Component Value Date   HGBA1C 7.1 (H) 05/16/2023    Anthropometrics  Wt Readings from Last 3 Encounters:  05/21/23 215 lb (97.5 kg)  04/08/23 221 lb (100.2 kg)  02/18/23 218 lb (98.9 kg)   Ht Readings from Last 3 Encounters:  05/21/23 5\' 6"  (1.676 m)  04/08/23 5\' 6"  (1.676 m)  02/18/23 5\' 3"  (1.6 m)   There is no height or weight on file to calculate BMI. @BMIFA @ Facility age limit for growth %iles is 20 years. Facility age limit for growth %iles is 20 years.    Clinical Medical Hx: See chart Medications: Lantus 50 units at night, Glipizide Labs:  Lab Results  Component Value Date   HGBA1C 7.1 (H) 05/16/2023       Latest Ref Rng & Units 05/16/2023    8:31 AM 02/11/2023    8:13 AM 12/06/2022   10:51 AM  CMP  Glucose 70 - 99 mg/dL 59  098  71   BUN 8 - 27 mg/dL 26  21  32   Creatinine 0.57 - 1.00 mg/dL 1.19  1.47  8.29   Sodium 134 - 144 mmol/L 141  136  135   Potassium 3.5 - 5.2 mmol/L 4.6  4.2  4.1   Chloride 96 - 106 mmol/L 103  99  103   CO2 20 - 29 mmol/L 22  23  24    Calcium 8.7 - 10.3 mg/dL 9.1  9.0  8.6   Total Protein 6.0 - 8.5 g/dL 6.3   7.1   Total  Bilirubin 0.0 - 1.2 mg/dL 0.3   0.4   Alkaline Phos 44 - 121 IU/L 118   110   AST 0 - 40 IU/L 30   28   ALT 0 - 32 IU/L 36   36    Lipid Panel     Component Value Date/Time   CHOL 98 (L) 05/16/2023 0831   TRIG 50 05/16/2023 0831   HDL 46 05/16/2023 0831   CHOLHDL 2.1 05/16/2023 0831   CHOLHDL 3.3 03/30/2019 0914   VLDL 29 11/04/2016 1033   LDLCALC 40 05/16/2023 0831   LDLCALC 106 (H) 03/30/2019 0914   LABVLDL 12 05/16/2023 0831    Notable Signs/Symptoms: Tired, craves sweets  Lifestyle & Dietary Hx Lives by herself. Cooks at home and eats out.  Estimated daily fluid intake: 30 oz Supplements:  Sleep: 6-7  Stress / self-care:  Current average weekly physical activity: ADL  24-Hr Dietary Recall First Meal:Boiiled egg no yolk  and fruit Snack:  Second   Snack:  Third Meal:  Fish, oyster and scallops,  water Snack: nabs Beverages: water  Estimated Energy Needs Calories: 1400 Carbohydrate: 158g Protein: 105g Fat: 39g   NUTRITION DIAGNOSIS  NB-1.1 Food and nutrition-related knowledge deficit As related to Diabetes Type 2.  As evidenced by A1C 10.2%.   NUTRITION INTERVENTION  Nutrition education (E-1) on the following topics:  Nutrition and Diabetes education provided on My Plate, CHO counting, meal planning, portion sizes, timing of meals, avoiding snacks between meals unless having a low blood sugar, target ranges for A1C and blood sugars, signs/symptoms and treatment of hyper/hypoglycemia, monitoring blood sugars, taking medications as prescribed, benefits of exercising 30 minutes per day and prevention of complications of DM.  Lifestyle Medicine  - Whole Food, Plant Predominant Nutrition is highly recommended: Eat Plenty of vegetables, Mushrooms, fruits, Legumes, Whole Grains, Nuts, seeds in lieu of processed meats, processed snacks/pastries red meat, poultry, eggs.    -It is better to avoid simple carbohydrates including: Cakes, Sweet Desserts, Ice Cream, Soda  (diet and regular), Sweet Tea, Candies, Chips, Cookies, Store Bought Juices, Alcohol in Excess of  1-2 drinks a day, Lemonade,  Artificial Sweeteners, Doughnuts, Coffee Creamers, "Sugar-free" Products, etc, etc.  This is not a complete list.....  Exercise: If you are able: 30 -60 minutes a day ,4 days a week, or 150 minutes a week.  The longer the better.  Combine stretch, strength, and aerobic activities.  If you were told in the past that you have high risk for cardiovascular diseases, you may seek evaluation by your heart doctor prior to initiating moderate to intense exercise programs.    Handouts Provided Include  Lifestyle Medicine Dexcom insturctions  Learning Style & Readiness for Change Teaching method utilized: Visual & Auditory  Demonstrated degree of understanding via: Teach Back  Barriers to learning/adherence to lifestyle change: None  Goals Established by Pt Goals Cut out soda Drink 64 oz of water per day  MONITORING & EVALUATION Dietary intake, weekly physical activity, and weight and BS in 3 month.  Next Steps  Patient is to work on eating more plant based foods and cut out chips and sodas.Marland Kitchen

## 2023-05-21 NOTE — Patient Instructions (Signed)
Goals Reduce insulin to 45 units at night due to 4% of your blood sugars being too low. Eat 30-45 carbs at meals. Keep drinking water Increase physical activity as tolerated. Prevent low blood sugars.

## 2023-05-22 ENCOUNTER — Encounter: Payer: Self-pay | Admitting: Nutrition

## 2023-05-23 ENCOUNTER — Other Ambulatory Visit: Payer: Self-pay | Admitting: Family Medicine

## 2023-05-26 DIAGNOSIS — N183 Chronic kidney disease, stage 3 unspecified: Secondary | ICD-10-CM | POA: Insufficient documentation

## 2023-05-26 NOTE — Assessment & Plan Note (Signed)
  Patient re-educated about  the importance of commitment to a  minimum of 150 minutes of exercise per week as able.  The importance of healthy food choices with portion control discussed, as well as eating regularly and within a 12 hour window most days. The need to choose "clean , green" food 50 to 75% of the time is discussed, as well as to make water the primary drink and set a goal of 64 ounces water daily.       05/21/2023   11:01 AM 05/21/2023    8:43 AM 04/08/2023   10:46 AM  Weight /BMI  Weight 216 lb 215 lb 221 lb  Height 5\' 6"  (1.676 m) 5\' 6"  (1.676 m) 5\' 6"  (1.676 m)  BMI 34.86 kg/m2 34.7 kg/m2 35.67 kg/m2

## 2023-05-26 NOTE — Assessment & Plan Note (Signed)
DASH diet and commitment to daily physical activity for a minimum of 30 minutes discussed and encouraged, as a part of hypertension management. The importance of attaining a healthy weight is also discussed.     05/21/2023   11:01 AM 05/21/2023    8:43 AM 04/08/2023   10:46 AM 02/18/2023    1:18 PM 02/18/2023   10:14 AM 12/13/2022    9:27 AM 11/19/2022   11:03 AM  BP/Weight  Systolic BP  119 161  104 95   Diastolic BP  73 69  69 65   Wt. (Lbs) 216 215 221 218 218.12 224.43 221  BMI 34.86 kg/m2 34.7 kg/m2 35.67 kg/m2 38.62 kg/m2 35.21 kg/m2 36.22 kg/m2 35.67 kg/m2     Controlled, no change in medication

## 2023-05-26 NOTE — Assessment & Plan Note (Addendum)
Diabetes associated with hypertension, hyperlipidemia, obesity, and CKD Improved, inc mounjaro dose to 7.5 mg weekly  Helen Mahoney is reminded of the importance of commitment to daily physical activity for 30 minutes or more, as able and the need to limit carbohydrate intake to 30 to 60 grams per meal to help with blood sugar control.   The need to take medication as prescribed, test blood sugar as directed, and to call between visits if there is a concern that blood sugar is uncontrolled is also discussed.   Helen Mahoney is reminded of the importance of daily foot exam, annual eye examination, and good blood sugar, blood pressure and cholesterol control.     Latest Ref Rng & Units 05/16/2023    8:31 AM 02/11/2023    8:13 AM 12/06/2022   10:51 AM 10/15/2022    9:08 AM 07/11/2022   11:02 AM  Diabetic Labs  HbA1c 4.8 - 5.6 % 7.1  8.3   8.4  7.5   Chol 100 - 199 mg/dL 98    811    HDL >91 mg/dL 46    50    Calc LDL 0 - 99 mg/dL 40    48    Triglycerides 0 - 149 mg/dL 50    77    Creatinine 0.57 - 1.00 mg/dL 4.78  2.95  6.21  3.08  1.48       05/21/2023   11:01 AM 05/21/2023    8:43 AM 04/08/2023   10:46 AM 02/18/2023    1:18 PM 02/18/2023   10:14 AM 12/13/2022    9:27 AM 11/19/2022   11:03 AM  BP/Weight  Systolic BP  119 657  104 95   Diastolic BP  73 69  69 65   Wt. (Lbs) 216 215 221 218 218.12 224.43 221  BMI 34.86 kg/m2 34.7 kg/m2 35.67 kg/m2 38.62 kg/m2 35.21 kg/m2 36.22 kg/m2 35.67 kg/m2      Latest Ref Rng & Units 02/27/2023   12:00 AM 02/18/2023   10:20 AM  Foot/eye exam completion dates  Eye Exam No Retinopathy No Retinopathy       Foot Form Completion   Done     This result is from an external source.

## 2023-05-26 NOTE — Progress Notes (Signed)
Helen Mahoney     MRN: [id number]      DOB: [birth year]  Chief Complaint  Patient presents with   Follow-up    Follow up    HPI Helen Mahoney is here for follow up and re-evaluation of chronic medical conditions, medication management and review of any available recent lab and radiology data.  Preventive health is updated, specifically  Cancer screening and Immunization.   Questions or concerns regarding consultations or procedures which the PT has had in the interim are  addressed. The PT denies any adverse reactions to current medications since the last visit.  There are no new concerns.  There are no specific complaints   ROS Denies recent fever or chills. Denies sinus pressure, nasal congestion, ear pain or sore throat. Denies chest congestion, productive cough or wheezing. Denies chest pains, palpitations and leg swelling Denies abdominal pain, nausea, vomiting,diarrhea or constipation.   Denies dysuria, frequency, hesitancy or incontinence. Denies joint pain, swelling and limitation in mobility. Denies headaches, seizures, numbness, or tingling. Denies depression, anxiety or insomnia. Denies skin break down or rash.   PE  BP 119/73 (BP Location: Right Arm, Patient Position: Sitting, Cuff Size: Large)   Pulse 86   Ht 5\' 6"  (1.676 m)   Wt 215 lb (97.5 kg)   SpO2 98%   BMI 34.70 kg/m   Patient alert and oriented and in no cardiopulmonary distress.  HEENT: No facial asymmetry, EOMI,     Neck supple .  Chest: Clear to auscultation bilaterally.  CVS: S1, S2 no murmurs, no S3.Regular rate.  ABD: Soft non tender.   Ext: No edema  MS: Adequate ROM spine, shoulders, hips and knees.  Skin: Intact, no ulcerations or rash noted.  Psych: Good eye contact, normal affect. Memory intact not anxious or depressed appearing.  CNS: CN 2-12 intact, power,  normal throughout.no focal deficits noted.   Assessment & Plan  Diabetes mellitus with kidney complication,  with long-term current use of insulin (HCC) Diabetes associated with hypertension, hyperlipidemia, obesity, and CKD Improved, inc mounjaro dose to 7.5 mg weekly  Helen Mahoney is reminded of the importance of commitment to daily physical activity for 30 minutes or more, as able and the need to limit carbohydrate intake to 30 to 60 grams per meal to help with blood sugar control.   The need to take medication as prescribed, test blood sugar as directed, and to call between visits if there is a concern that blood sugar is uncontrolled is also discussed.   Helen Mahoney is reminded of the importance of daily foot exam, annual eye examination, and good blood sugar, blood pressure and cholesterol control.     Latest Ref Rng & Units 05/16/2023    8:31 AM 02/11/2023    8:13 AM 12/06/2022   10:51 AM 10/15/2022    9:08 AM 07/11/2022   11:02 AM  Diabetic Labs  HbA1c 4.8 - 5.6 % 7.1  8.3   8.4  7.5   Chol 100 - 199 mg/dL 98    409    HDL >81 mg/dL 46    50    Calc LDL 0 - 99 mg/dL 40    48    Triglycerides 0 - 149 mg/dL 50    77    Creatinine 0.57 - 1.00 mg/dL 1.91  4.78  2.95  6.21  1.48       05/21/2023   11:01 AM 05/21/2023    8:43 AM 04/08/2023   10:46 AM  02/18/2023    1:18 PM 02/18/2023   10:14 AM 12/13/2022    9:27 AM 11/19/2022   11:03 AM  BP/Weight  Systolic BP  119 161  104 95   Diastolic BP  73 69  69 65   Wt. (Lbs) 216 215 221 218 218.12 224.43 221  BMI 34.86 kg/m2 34.7 kg/m2 35.67 kg/m2 38.62 kg/m2 35.21 kg/m2 36.22 kg/m2 35.67 kg/m2      Latest Ref Rng & Units 02/27/2023   12:00 AM 02/18/2023   10:20 AM  Foot/eye exam completion dates  Eye Exam No Retinopathy No Retinopathy       Foot Form Completion   Done     This result is from an external source.        Morbid obesity  Patient re-educated about  the importance of commitment to a  minimum of 150 minutes of exercise per week as able.  The importance of healthy food choices with portion control discussed, as well as  eating regularly and within a 12 hour window most days. The need to choose "clean , green" food 50 to 75% of the time is discussed, as well as to make water the primary drink and set a goal of 64 ounces water daily.       05/21/2023   11:01 AM 05/21/2023    8:43 AM 04/08/2023   10:46 AM  Weight /BMI  Weight 216 lb 215 lb 221 lb  Height 5\' 6"  (1.676 m) 5\' 6"  (1.676 m) 5\' 6"  (1.676 m)  BMI 34.86 kg/m2 34.7 kg/m2 35.67 kg/m2      Hyperlipidemia LDL goal <70 Hyperlipidemia:Low fat diet discussed and encouraged.   Lipid Panel  Lab Results  Component Value Date   CHOL 98 (L) 05/16/2023   HDL 46 05/16/2023   LDLCALC 40 05/16/2023   TRIG 50 05/16/2023   CHOLHDL 2.1 05/16/2023     Controlled, no change in medication   Essential hypertension DASH diet and commitment to daily physical activity for a minimum of 30 minutes discussed and encouraged, as a part of hypertension management. The importance of attaining a healthy weight is also discussed.     05/21/2023   11:01 AM 05/21/2023    8:43 AM 04/08/2023   10:46 AM 02/18/2023    1:18 PM 02/18/2023   10:14 AM 12/13/2022    9:27 AM 11/19/2022   11:03 AM  BP/Weight  Systolic BP  119 096  104 95   Diastolic BP  73 69  69 65   Wt. (Lbs) 216 215 221 218 218.12 224.43 221  BMI 34.86 kg/m2 34.7 kg/m2 35.67 kg/m2 38.62 kg/m2 35.21 kg/m2 36.22 kg/m2 35.67 kg/m2     Controlled, no change in medication   CKD (chronic kidney disease) stage 3, GFR 30-59 ml/min (HCC) Followed by Nephrology stable

## 2023-05-26 NOTE — Assessment & Plan Note (Signed)
Hyperlipidemia:Low fat diet discussed and encouraged.   Lipid Panel  Lab Results  Component Value Date   CHOL 98 (L) 05/16/2023   HDL 46 05/16/2023   LDLCALC 40 05/16/2023   TRIG 50 05/16/2023   CHOLHDL 2.1 05/16/2023     Controlled, no change in medication

## 2023-05-26 NOTE — Assessment & Plan Note (Signed)
Followed by Nephrology stable

## 2023-06-06 ENCOUNTER — Other Ambulatory Visit: Payer: Self-pay | Admitting: Family Medicine

## 2023-06-12 ENCOUNTER — Other Ambulatory Visit: Payer: Self-pay

## 2023-06-12 DIAGNOSIS — D508 Other iron deficiency anemias: Secondary | ICD-10-CM

## 2023-06-12 DIAGNOSIS — E538 Deficiency of other specified B group vitamins: Secondary | ICD-10-CM

## 2023-06-12 DIAGNOSIS — E559 Vitamin D deficiency, unspecified: Secondary | ICD-10-CM

## 2023-06-12 DIAGNOSIS — D563 Thalassemia minor: Secondary | ICD-10-CM

## 2023-06-12 DIAGNOSIS — R778 Other specified abnormalities of plasma proteins: Secondary | ICD-10-CM

## 2023-06-13 ENCOUNTER — Inpatient Hospital Stay: Payer: Medicare HMO | Attending: Hematology

## 2023-06-13 DIAGNOSIS — R768 Other specified abnormal immunological findings in serum: Secondary | ICD-10-CM | POA: Diagnosis not present

## 2023-06-13 DIAGNOSIS — E1122 Type 2 diabetes mellitus with diabetic chronic kidney disease: Secondary | ICD-10-CM | POA: Insufficient documentation

## 2023-06-13 DIAGNOSIS — D563 Thalassemia minor: Secondary | ICD-10-CM | POA: Diagnosis not present

## 2023-06-13 DIAGNOSIS — E559 Vitamin D deficiency, unspecified: Secondary | ICD-10-CM

## 2023-06-13 DIAGNOSIS — D508 Other iron deficiency anemias: Secondary | ICD-10-CM | POA: Diagnosis not present

## 2023-06-13 DIAGNOSIS — R634 Abnormal weight loss: Secondary | ICD-10-CM | POA: Insufficient documentation

## 2023-06-13 DIAGNOSIS — D472 Monoclonal gammopathy: Secondary | ICD-10-CM | POA: Insufficient documentation

## 2023-06-13 DIAGNOSIS — D509 Iron deficiency anemia, unspecified: Secondary | ICD-10-CM | POA: Diagnosis present

## 2023-06-13 DIAGNOSIS — R778 Other specified abnormalities of plasma proteins: Secondary | ICD-10-CM

## 2023-06-13 DIAGNOSIS — Z79899 Other long term (current) drug therapy: Secondary | ICD-10-CM | POA: Diagnosis not present

## 2023-06-13 DIAGNOSIS — N1832 Chronic kidney disease, stage 3b: Secondary | ICD-10-CM | POA: Insufficient documentation

## 2023-06-13 DIAGNOSIS — E538 Deficiency of other specified B group vitamins: Secondary | ICD-10-CM | POA: Diagnosis not present

## 2023-06-13 LAB — CBC WITH DIFFERENTIAL/PLATELET
Abs Immature Granulocytes: 0.04 10*3/uL (ref 0.00–0.07)
Basophils Absolute: 0 10*3/uL (ref 0.0–0.1)
Basophils Relative: 1 %
Eosinophils Absolute: 0.2 10*3/uL (ref 0.0–0.5)
Eosinophils Relative: 3 %
HCT: 35 % — ABNORMAL LOW (ref 36.0–46.0)
Hemoglobin: 10.7 g/dL — ABNORMAL LOW (ref 12.0–15.0)
Immature Granulocytes: 1 %
Lymphocytes Relative: 31 %
Lymphs Abs: 2.2 10*3/uL (ref 0.7–4.0)
MCH: 21.8 pg — ABNORMAL LOW (ref 26.0–34.0)
MCHC: 30.6 g/dL (ref 30.0–36.0)
MCV: 71.4 fL — ABNORMAL LOW (ref 80.0–100.0)
Monocytes Absolute: 0.4 10*3/uL (ref 0.1–1.0)
Monocytes Relative: 5 %
Neutro Abs: 4.3 10*3/uL (ref 1.7–7.7)
Neutrophils Relative %: 59 %
Platelets: 339 10*3/uL (ref 150–400)
RBC: 4.9 MIL/uL (ref 3.87–5.11)
RDW: 17 % — ABNORMAL HIGH (ref 11.5–15.5)
WBC: 7.2 10*3/uL (ref 4.0–10.5)
nRBC: 0 % (ref 0.0–0.2)

## 2023-06-13 LAB — VITAMIN D 25 HYDROXY (VIT D DEFICIENCY, FRACTURES): Vit D, 25-Hydroxy: 48.78 ng/mL (ref 30–100)

## 2023-06-13 LAB — COMPREHENSIVE METABOLIC PANEL
ALT: 30 U/L (ref 0–44)
AST: 27 U/L (ref 15–41)
Albumin: 3.6 g/dL (ref 3.5–5.0)
Alkaline Phosphatase: 100 U/L (ref 38–126)
Anion gap: 11 (ref 5–15)
BUN: 21 mg/dL (ref 8–23)
CO2: 24 mmol/L (ref 22–32)
Calcium: 9.1 mg/dL (ref 8.9–10.3)
Chloride: 102 mmol/L (ref 98–111)
Creatinine, Ser: 1.32 mg/dL — ABNORMAL HIGH (ref 0.44–1.00)
GFR, Estimated: 43 mL/min — ABNORMAL LOW (ref >60)
Glucose, Bld: 228 mg/dL — ABNORMAL HIGH (ref 70–99)
Potassium: 4.3 mmol/L (ref 3.5–5.1)
Sodium: 137 mmol/L (ref 135–145)
Total Bilirubin: 0.6 mg/dL (ref 0.0–1.2)
Total Protein: 7.5 g/dL (ref 6.5–8.1)

## 2023-06-13 LAB — IRON AND TIBC
Iron: 72 ug/dL (ref 28–170)
Saturation Ratios: 22 % (ref 10.4–31.8)
TIBC: 322 ug/dL (ref 250–450)
UIBC: 250 ug/dL

## 2023-06-13 LAB — LACTATE DEHYDROGENASE: LDH: 172 U/L (ref 98–192)

## 2023-06-13 LAB — FERRITIN: Ferritin: 180 ng/mL (ref 11–307)

## 2023-06-13 LAB — FOLATE: Folate: 26.7 ng/mL (ref >5.9)

## 2023-06-13 LAB — VITAMIN B12: Vitamin B-12: 439 pg/mL (ref 180–914)

## 2023-06-14 LAB — BETA 2 MICROGLOBULIN, SERUM: Beta-2 Microglobulin: 3.4 mg/L — ABNORMAL HIGH (ref 0.6–2.4)

## 2023-06-16 LAB — METHYLMALONIC ACID, SERUM: Methylmalonic Acid, Quantitative: 224 nmol/L (ref 0–378)

## 2023-06-16 LAB — KAPPA/LAMBDA LIGHT CHAINS
Kappa free light chain: 62.8 mg/L — ABNORMAL HIGH (ref 3.3–19.4)
Kappa, lambda light chain ratio: 1.98 — ABNORMAL HIGH (ref 0.26–1.65)
Lambda free light chains: 31.7 mg/L — ABNORMAL HIGH (ref 5.7–26.3)

## 2023-06-16 LAB — IMMUNOFIXATION ELECTROPHORESIS
IgA: 377 mg/dL (ref 64–422)
IgG (Immunoglobin G), Serum: 1265 mg/dL (ref 586–1602)
IgM (Immunoglobulin M), Srm: 68 mg/dL (ref 26–217)
Total Protein ELP: 6.5 g/dL (ref 6.0–8.5)

## 2023-06-18 LAB — PROTEIN ELECTROPHORESIS, SERUM
A/G Ratio: 1 (ref 0.7–1.7)
Albumin ELP: 3.4 g/dL (ref 2.9–4.4)
Alpha-1-Globulin: 0.3 g/dL (ref 0.0–0.4)
Alpha-2-Globulin: 0.8 g/dL (ref 0.4–1.0)
Beta Globulin: 1.2 g/dL (ref 0.7–1.3)
Gamma Globulin: 1.1 g/dL (ref 0.4–1.8)
Globulin, Total: 3.4 g/dL (ref 2.2–3.9)
Total Protein ELP: 6.8 g/dL (ref 6.0–8.5)

## 2023-06-19 ENCOUNTER — Encounter: Payer: Self-pay | Admitting: Oncology

## 2023-06-19 ENCOUNTER — Inpatient Hospital Stay (HOSPITAL_BASED_OUTPATIENT_CLINIC_OR_DEPARTMENT_OTHER): Payer: Medicare HMO | Admitting: Oncology

## 2023-06-19 DIAGNOSIS — D508 Other iron deficiency anemias: Secondary | ICD-10-CM | POA: Diagnosis not present

## 2023-06-19 DIAGNOSIS — E559 Vitamin D deficiency, unspecified: Secondary | ICD-10-CM | POA: Diagnosis not present

## 2023-06-19 DIAGNOSIS — R778 Other specified abnormalities of plasma proteins: Secondary | ICD-10-CM

## 2023-06-19 DIAGNOSIS — D563 Thalassemia minor: Secondary | ICD-10-CM

## 2023-06-19 DIAGNOSIS — E538 Deficiency of other specified B group vitamins: Secondary | ICD-10-CM | POA: Diagnosis not present

## 2023-06-19 NOTE — Progress Notes (Signed)
Virtual Visit via Telephone Note  I connected with Helen Mahoney on 06/19/23 at  2:00 PM EST by telephone and verified that I am speaking with the correct person using two identifiers.  Location: Patient: Home Provider: Home office   I discussed the limitations, risks, security and privacy concerns of performing an evaluation and management service by telephone and the availability of in person appointments. I also discussed with the patient that there may be a patient responsible charge related to this service. The patient expressed understanding and agreed to proceed.   History of Present Illness: Patient is a 73 year old female who is followed by hematology for alpha thalassemia minor, iron deficiency and B12 deficiency.  Has new diagnosis of MGUS.  She was last seen in clinic on 12/13/2022.  She has not needed IV iron since 2017.  She did receive monthly B12 injections for 6 months in 2021.  She denies any hospitalizations, surgeries or changes to her baseline health.  She is currently taking oral iron, B12 and vitamin D daily.  She is followed closely by Dr. Wolfgang Phoenix for her kidneys.  Reports overall feels well.  Has some intentional weight loss to try and control her diabetes.  Otherwise no B symptoms.  Observations/Objective:Review of Systems  Constitutional:  Positive for weight loss. Negative for malaise/fatigue.  Gastrointestinal:  Negative for blood in stool, constipation and diarrhea.  Musculoskeletal:  Negative for myalgias.  Psychiatric/Behavioral:  Negative for substance abuse. The patient is not nervous/anxious.      Physical Exam Neurological:     Mental Status: She is alert and oriented to person, place, and time.     Assessment and Plan: 1. Other iron deficiency anemia (Primary) -Labs from 06/13/2023 show a ferritin of 180, iron saturation 22% with TIBC 322.  Hemoglobin is 10.7 which appears to be her baseline. -She has not required IV iron since  2017. -She is currently taking oral iron, B12 and vitamin D.  She was instructed to stop folic acid. -Based on labs, she does not need an additional IV iron.  I would continue oral iron supplements as she is able to tolerate well.  2. B12 deficiency -Has received B12 injections in the past although not recently. -Most recent labs show B12 level of 439 with MMA of 224. -Continue oral B12.  3. Alpha thalassaemia minor -Longstanding history of microcytic anemia.  Alpha thalassemia genotype study done on 08/17/2015. -Baseline hemoglobin ranges from 9.5-11. -Hemoglobin is stable at 10.7 today. -She does not need any intervention at this time.  4. Abnormal SPEP -She is followed by Dr. Wolfgang Phoenix for CKD. -Incidentally found to have elevated kappa and lambda light chains along with kappa lambda light chain ratio.  Urine immunofixation showed polyclonal light chains negative for monoclonal immunoglobulin or free light chain. -Hematology workup from 05/23/2022 showed SPEP negative, immunofixation shows polyclonal increase in immunoglobulins.  Mildly elevated beta-2 globulin in the setting of CKD LDH was normal. -She continues to deny any B symptoms. -Protein electrophoresis from 06/13/2023 was negative for M spike and immunofixation pattern appears unremarkable.  Evidence of monoclonal protein is not apparent.  Beta-2 microglobulin slightly elevated at 3.4 in the setting of CKD.  LDH is normal at 172.  Kappa lambda light chain ratio elevated at 1.98 with improvement from previous.  Both kappa free and lambda free light chains are elevated but significantly improved from previous. -Recommend we continue to monitor with labs every 12 months.   Follow Up Instructions: Continue OTC iron tabs,  vitamin B12 and vitamin D.  Continue to hold folic acid.  Return to clinic in approximately 6 months with labs a few days before and in person visit.   I discussed the assessment and treatment plan with the patient. The  patient was provided an opportunity to ask questions and all were answered. The patient agreed with the plan and demonstrated an understanding of the instructions.   The patient was advised to call back or seek an in-person evaluation if the symptoms worsen or if the condition fails to improve as anticipated.  I provided 20 minutes of non-face-to-face time during this encounter.   Mauro Kaufmann, NP

## 2023-06-20 ENCOUNTER — Telehealth: Payer: Medicare HMO | Admitting: Oncology

## 2023-07-10 ENCOUNTER — Other Ambulatory Visit: Payer: Self-pay | Admitting: Family Medicine

## 2023-07-23 DIAGNOSIS — R809 Proteinuria, unspecified: Secondary | ICD-10-CM | POA: Diagnosis not present

## 2023-07-23 DIAGNOSIS — E211 Secondary hyperparathyroidism, not elsewhere classified: Secondary | ICD-10-CM | POA: Diagnosis not present

## 2023-07-23 DIAGNOSIS — N189 Chronic kidney disease, unspecified: Secondary | ICD-10-CM | POA: Diagnosis not present

## 2023-07-23 DIAGNOSIS — D631 Anemia in chronic kidney disease: Secondary | ICD-10-CM | POA: Diagnosis not present

## 2023-07-31 DIAGNOSIS — N1832 Chronic kidney disease, stage 3b: Secondary | ICD-10-CM | POA: Diagnosis not present

## 2023-07-31 DIAGNOSIS — E1122 Type 2 diabetes mellitus with diabetic chronic kidney disease: Secondary | ICD-10-CM | POA: Diagnosis not present

## 2023-07-31 DIAGNOSIS — N2581 Secondary hyperparathyroidism of renal origin: Secondary | ICD-10-CM | POA: Diagnosis not present

## 2023-07-31 DIAGNOSIS — D638 Anemia in other chronic diseases classified elsewhere: Secondary | ICD-10-CM | POA: Diagnosis not present

## 2023-08-14 ENCOUNTER — Other Ambulatory Visit: Payer: Self-pay | Admitting: Family Medicine

## 2023-08-21 ENCOUNTER — Ambulatory Visit: Payer: Medicare HMO | Admitting: Family Medicine

## 2023-08-21 ENCOUNTER — Encounter: Payer: Medicare HMO | Attending: Family Medicine | Admitting: Nutrition

## 2023-08-21 VITALS — Ht 65.0 in | Wt 218.0 lb

## 2023-08-21 DIAGNOSIS — E538 Deficiency of other specified B group vitamins: Secondary | ICD-10-CM | POA: Diagnosis not present

## 2023-08-21 DIAGNOSIS — E785 Hyperlipidemia, unspecified: Secondary | ICD-10-CM | POA: Insufficient documentation

## 2023-08-21 DIAGNOSIS — E559 Vitamin D deficiency, unspecified: Secondary | ICD-10-CM | POA: Insufficient documentation

## 2023-08-21 DIAGNOSIS — E1159 Type 2 diabetes mellitus with other circulatory complications: Secondary | ICD-10-CM | POA: Insufficient documentation

## 2023-08-21 DIAGNOSIS — I10 Essential (primary) hypertension: Secondary | ICD-10-CM | POA: Insufficient documentation

## 2023-08-21 NOTE — Patient Instructions (Addendum)
 Goals Established by Pt Call Dr. Rodolph Clap office to get Moujero 7.5 mg re ordered. Increase fruits and vegetables Drink only water  and get rid of diet sodas Walk for 15-30 minutes

## 2023-08-21 NOTE — Progress Notes (Signed)
 Medical Nutrition Therapy  Appointment Start time:  1300 Appointment End time:  1330  Primary concerns today: Obesity and DM Type 2, CKD Referral diagnosis: E11.8, E66.9, N18.3 Preferred learning style: No preference.  Learning readiness: Change in progress    NUTRITION ASSESSMENT    Hasn't had Mounjero since January. She says  MD office hasn't called in new prescription for increased dose.Aaron Aas  She will call them to get new prescription. Still on Glipizide . Lantus  50 units.She does have 4% low blood sugars in am.  Dexcom shows TIR 60%, Avg BS 181 mg/dl. Glucose high from lunch to dinner. Her sister has started bringing over sodas and junk food. Got good report from her CKD MD she reports recently. Has gotten hooked back on sodas since not having Mounjero. Not exercising. Drinking diet sodas and eating higher processed foods at times.  Wt Readings from Last 3 Encounters:  05/21/23 216 lb (98 kg)  05/21/23 215 lb (97.5 kg)  04/08/23 221 lb (100.2 kg)   Ht Readings from Last 3 Encounters:  05/21/23 5\' 6"  (1.676 m)  05/21/23 5\' 6"  (1.676 m)  04/08/23 5\' 6"  (1.676 m)   There is no height or weight on file to calculate BMI. @BMIFA @ Facility age limit for growth %iles is 20 years. Facility age limit for growth %iles is 20 years.  Lab Results  Component Value Date   HGBA1C 7.1 (H) 05/16/2023    Anthropometrics  Wt Readings from Last 3 Encounters:  05/21/23 216 lb (98 kg)  05/21/23 215 lb (97.5 kg)  04/08/23 221 lb (100.2 kg)   Ht Readings from Last 3 Encounters:  05/21/23 5\' 6"  (1.676 m)  05/21/23 5\' 6"  (1.676 m)  04/08/23 5\' 6"  (1.676 m)   There is no height or weight on file to calculate BMI. @BMIFA @ Facility age limit for growth %iles is 20 years. Facility age limit for growth %iles is 20 years.    Clinical Medical Hx: See chart Medications: Lantus  50 units at night, Glipizide  Labs:  Lab Results  Component Value Date   HGBA1C 7.1 (H) 05/16/2023       Latest  Ref Rng & Units 06/13/2023   10:22 AM 05/16/2023    8:31 AM 02/11/2023    8:13 AM  CMP  Glucose 70 - 99 mg/dL 960  59  454   BUN 8 - 23 mg/dL 21  26  21    Creatinine 0.44 - 1.00 mg/dL 0.98  1.19  1.47   Sodium 135 - 145 mmol/L 137  141  136   Potassium 3.5 - 5.1 mmol/L 4.3  4.6  4.2   Chloride 98 - 111 mmol/L 102  103  99   CO2 22 - 32 mmol/L 24  22  23    Calcium  8.9 - 10.3 mg/dL 9.1  9.1  9.0   Total Protein 6.5 - 8.1 g/dL 7.5  6.3    Total Bilirubin 0.0 - 1.2 mg/dL 0.6  0.3    Alkaline Phos 38 - 126 U/L 100  118    AST 15 - 41 U/L 27  30    ALT 0 - 44 U/L 30  36     Lipid Panel     Component Value Date/Time   CHOL 98 (L) 05/16/2023 0831   TRIG 50 05/16/2023 0831   HDL 46 05/16/2023 0831   CHOLHDL 2.1 05/16/2023 0831   CHOLHDL 3.3 03/30/2019 0914   VLDL 29 11/04/2016 1033   LDLCALC 40 05/16/2023 0831   LDLCALC  106 (H) 03/30/2019 0914   LABVLDL 12 05/16/2023 0831    Notable Signs/Symptoms: Tired, craves sweets  Lifestyle & Dietary Hx Lives by herself. Cooks at home and eats out.  Estimated daily fluid intake: 30 oz Supplements:  Sleep: 6-7  Stress / self-care:  Current average weekly physical activity: ADL  24-Hr Dietary Recall B) salmon patty or Eggs sausage and 1 slice toast, Coke zero L) skips D) Salmon patty, turnip green and cabbage, coke zero Estimated Energy Needs Calories: 1400 Carbohydrate: 158g Protein: 105g Fat: 39g   NUTRITION DIAGNOSIS  NB-1.1 Food and nutrition-related knowledge deficit As related to Diabetes Type 2.  As evidenced by A1C 10.2%.   NUTRITION INTERVENTION  Nutrition education (E-1) on the following topics:  Nutrition and Diabetes education provided on My Plate, CHO counting, meal planning, portion sizes, timing of meals, avoiding snacks between meals unless having a low blood sugar, target ranges for A1C and blood sugars, signs/symptoms and treatment of hyper/hypoglycemia, monitoring blood sugars, taking medications as prescribed,  benefits of exercising 30 minutes per day and prevention of complications of DM.  Lifestyle Medicine  - Whole Food, Plant Predominant Nutrition is highly recommended: Eat Plenty of vegetables, Mushrooms, fruits, Legumes, Whole Grains, Nuts, seeds in lieu of processed meats, processed snacks/pastries red meat, poultry, eggs.    -It is better to avoid simple carbohydrates including: Cakes, Sweet Desserts, Ice Cream, Soda (diet and regular), Sweet Tea, Candies, Chips, Cookies, Store Bought Juices, Alcohol in Excess of  1-2 drinks a day, Lemonade,  Artificial Sweeteners, Doughnuts, Coffee Creamers, "Sugar-free" Products, etc, etc.  This is not a complete list.....  Exercise: If you are able: 30 -60 minutes a day ,4 days a week, or 150 minutes a week.  The longer the better.  Combine stretch, strength, and aerobic activities.  If you were told in the past that you have high risk for cardiovascular diseases, you may seek evaluation by your heart doctor prior to initiating moderate to intense exercise programs.    Handouts Provided Include  Lifestyle Medicine Dexcom insturctions  Learning Style & Readiness for Change Teaching method utilized: Visual & Auditory  Demonstrated degree of understanding via: Teach Back  Barriers to learning/adherence to lifestyle change: None  Goals Established by Pt Call Dr. Rodolph Clap office to get Moujero 7.5 mg re ordered. Increase fruits and vegetables Drink only water  and get rid of diet sodas Walk for 15-30 minutes     MONITORING & EVALUATION Dietary intake, weekly physical activity, and weight and BS in 3 month.  Next Steps  Patient is to work on eating more plant based foods and cut out chips and sodas.Aaron Aas

## 2023-08-26 ENCOUNTER — Encounter: Payer: Self-pay | Admitting: Nutrition

## 2023-09-11 ENCOUNTER — Other Ambulatory Visit: Payer: Self-pay | Admitting: Family Medicine

## 2023-09-23 ENCOUNTER — Encounter: Payer: Self-pay | Admitting: Family Medicine

## 2023-09-23 ENCOUNTER — Other Ambulatory Visit (HOSPITAL_COMMUNITY): Payer: Self-pay | Admitting: Family Medicine

## 2023-09-23 ENCOUNTER — Ambulatory Visit (INDEPENDENT_AMBULATORY_CARE_PROVIDER_SITE_OTHER): Admitting: Family Medicine

## 2023-09-23 VITALS — BP 119/72 | HR 89 | Resp 16 | Ht 66.0 in | Wt 220.0 lb

## 2023-09-23 DIAGNOSIS — Z794 Long term (current) use of insulin: Secondary | ICD-10-CM

## 2023-09-23 DIAGNOSIS — M1711 Unilateral primary osteoarthritis, right knee: Secondary | ICD-10-CM | POA: Diagnosis not present

## 2023-09-23 DIAGNOSIS — E1122 Type 2 diabetes mellitus with diabetic chronic kidney disease: Secondary | ICD-10-CM | POA: Diagnosis not present

## 2023-09-23 DIAGNOSIS — I10 Essential (primary) hypertension: Secondary | ICD-10-CM

## 2023-09-23 DIAGNOSIS — Z1231 Encounter for screening mammogram for malignant neoplasm of breast: Secondary | ICD-10-CM

## 2023-09-23 DIAGNOSIS — E559 Vitamin D deficiency, unspecified: Secondary | ICD-10-CM

## 2023-09-23 DIAGNOSIS — N1832 Chronic kidney disease, stage 3b: Secondary | ICD-10-CM | POA: Diagnosis not present

## 2023-09-23 DIAGNOSIS — E785 Hyperlipidemia, unspecified: Secondary | ICD-10-CM | POA: Diagnosis not present

## 2023-09-23 MED ORDER — TIRZEPATIDE 5 MG/0.5ML ~~LOC~~ SOAJ: 5.0000 mg | SUBCUTANEOUS | 0 refills | Status: DC

## 2023-09-23 NOTE — Assessment & Plan Note (Signed)
 Increased pain and stiffness, unsteady gait, handiicap sticker x 6 months, Ortho eval

## 2023-09-23 NOTE — Assessment & Plan Note (Signed)
  Patient re-educated about  the importance of commitment to a  minimum of 150 minutes of exercise per week as able.  The importance of healthy food choices with portion control discussed, as well as eating regularly and within a 12 hour window most days. The need to choose "clean , green" food 50 to 75% of the time is discussed, as well as to make water  the primary drink and set a goal of 64 ounces water  daily.       09/23/2023    1:21 PM 08/21/2023    1:04 PM 05/21/2023   11:01 AM  Weight /BMI  Weight 220 lb 218 lb 216 lb  Height 5\' 6"  (1.676 m) 5\' 5"  (1.651 m) 5\' 6"  (1.676 m)  BMI 35.51 kg/m2 36.28 kg/m2 34.86 kg/m2    Unchanged, needs to fill mounjaro

## 2023-09-23 NOTE — Progress Notes (Signed)
 Helen Mahoney     MRN: [id number]      DOB: 1950-10-26  Chief Complaint  Patient presents with   Medical Management of Chronic Issues    13 week follow up     HPI Helen Mahoney is here for follow up and re-evaluation of chronic medical conditions, medication management and review of any available recent lab and radiology data.  Preventive health is updated, specifically  Cancer screening and Immunization.   Questions or concerns regarding consultations or procedures which the PT has had in the interim are  addressed. The PT denies any adverse reactions to current medications since the last visit.   1 month h/o increased and uncontrolled right knee pain and swelling and stiffness, has to lift knee when she is driving at times C/o fluctuations in blood sugar, also notes often snacks between 7 pm and 11 pm  ROS Denies recent fever or chills. Denies sinus pressure, nasal congestion, ear pain or sore throat. Denies chest congestion, productive cough or wheezing. Denies chest pains, palpitations and leg swelling Denies abdominal pain, nausea, vomiting,diarrhea or constipation.   Denies dysuria, frequency, hesitancy or incontinence. Denies headaches, seizures, numbness, or tingling. Denies depression, anxiety or insomnia. Denies skin break down or rash.   PE  BP 119/72   Pulse 89   Resp 16   Ht 5\' 6"  (1.676 m)   Wt 220 lb (99.8 kg)   SpO2 97%   BMI 35.51 kg/m   Patient alert and oriented and in no cardiopulmonary distress.  HEENT: No facial asymmetry, EOMI,     Neck supple .  Chest: Clear to auscultation bilaterally.  CVS: S1, S2 no murmurs, no S3.Regular rate.  ABD: Soft non tender.   Ext: No edema  MS: Adequate ROM spine, shoulders, hips andmarkedly redusced in right knee which is deformed and has crepitus.  Skin: Intact, no ulcerations or rash noted.  Psych: Good eye contact, normal affect. Memory intact not anxious or depressed appearing.  CNS: CN 2-12  intact, power,  normal throughout.no focal deficits noted.   Assessment & Plan  Osteoarthritis of right knee 1 month h/o increased pain and instability, ortho eval  Essential hypertension Controlled, no change in medication DASH diet and commitment to daily physical activity for a minimum of 30 minutes discussed and encouraged, as a part of hypertension management. The importance of attaining a healthy weight is also discussed.     09/23/2023    1:21 PM 08/21/2023    1:04 PM 05/21/2023   11:01 AM 05/21/2023    8:43 AM 04/08/2023   10:46 AM 02/18/2023    1:18 PM 02/18/2023   10:14 AM  BP/Weight  Systolic BP 119   119 109  104  Diastolic BP 72   73 69  69  Wt. (Lbs) 220 218 216 215 221 218 218.12  BMI 35.51 kg/m2 36.28 kg/m2 34.86 kg/m2 34.7 kg/m2 35.67 kg/m2 38.62 kg/m2 35.21 kg/m2       Diabetes mellitus with kidney complication, with long-term current use of insulin  (HCC) Diabetes associated with hypertension, hyperlipidemia, obesity, CKD, and arthritis  Helen Mahoney is reminded of the importance of commitment to daily physical activity for 30 minutes or more, as able and the need to limit carbohydrate intake to 30 to 60 grams per meal to help with blood sugar control.   The need to take medication as prescribed, test blood sugar as directed, and to call between visits if there is a concern that blood sugar  is uncontrolled is also discussed.   Helen Mahoney is reminded of the importance of daily foot exam, annual eye examination, and good blood sugar, blood pressure and cholesterol control.     Latest Ref Rng & Units 06/13/2023   10:22 AM 05/16/2023    8:31 AM 02/11/2023    8:13 AM 12/06/2022   10:51 AM 10/15/2022    9:08 AM  Diabetic Labs  HbA1c 4.8 - 5.6 %  7.1  8.3   8.4   Chol 100 - 199 mg/dL  98    161   HDL >09 mg/dL  46    50   Calc LDL 0 - 99 mg/dL  40    48   Triglycerides 0 - 149 mg/dL  50    77   Creatinine 0.44 - 1.00 mg/dL 6.04  5.40  9.81  1.91  1.27        09/23/2023    1:21 PM 08/21/2023    1:04 PM 05/21/2023   11:01 AM 05/21/2023    8:43 AM 04/08/2023   10:46 AM 02/18/2023    1:18 PM 02/18/2023   10:14 AM  BP/Weight  Systolic BP 119   119 109  104  Diastolic BP 72   73 69  69  Wt. (Lbs) 220 218 216 215 221 218 218.12  BMI 35.51 kg/m2 36.28 kg/m2 34.86 kg/m2 34.7 kg/m2 35.67 kg/m2 38.62 kg/m2 35.21 kg/m2      Latest Ref Rng & Units 02/27/2023   12:00 AM 02/18/2023   10:20 AM  Foot/eye exam completion dates  Eye Exam No Retinopathy No Retinopathy       Foot Form Completion   Done     This result is from an external source.      Updated lab needed   Chronic pain of right knee Increased pain and stiffness, unsteady gait, handiicap sticker x 6 months, Ortho eval  Morbid obesity  Patient re-educated about  the importance of commitment to a  minimum of 150 minutes of exercise per week as able.  The importance of healthy food choices with portion control discussed, as well as eating regularly and within a 12 hour window most days. The need to choose "clean , green" food 50 to 75% of the time is discussed, as well as to make water  the primary drink and set a goal of 64 ounces water  daily.       09/23/2023    1:21 PM 08/21/2023    1:04 PM 05/21/2023   11:01 AM  Weight /BMI  Weight 220 lb 218 lb 216 lb  Height 5\' 6"  (1.676 m) 5\' 5"  (1.651 m) 5\' 6"  (1.676 m)  BMI 35.51 kg/m2 36.28 kg/m2 34.86 kg/m2    Unchanged, needs to fill mounjaro  Hyperlipidemia LDL goal <70 Hyperlipidemia:Low fat diet discussed and encouraged.   Lipid Panel  Lab Results  Component Value Date   CHOL 98 (L) 05/16/2023   HDL 46 05/16/2023   LDLCALC 40 05/16/2023   TRIG 50 05/16/2023   CHOLHDL 2.1 05/16/2023     Controlled, no change in medication Updated lab needed at/ before next visit.   Vitamin D  deficiency Updated lab needed at/ before next visit.

## 2023-09-23 NOTE — Assessment & Plan Note (Signed)
 Hyperlipidemia:Low fat diet discussed and encouraged.   Lipid Panel  Lab Results  Component Value Date   CHOL 98 (L) 05/16/2023   HDL 46 05/16/2023   LDLCALC 40 05/16/2023   TRIG 50 05/16/2023   CHOLHDL 2.1 05/16/2023     Controlled, no change in medication Updated lab needed at/ before next visit.

## 2023-09-23 NOTE — Assessment & Plan Note (Signed)
 Diabetes associated with hypertension, hyperlipidemia, obesity, CKD, and arthritis  Helen Mahoney is reminded of the importance of commitment to daily physical activity for 30 minutes or more, as able and the need to limit carbohydrate intake to 30 to 60 grams per meal to help with blood sugar control.   The need to take medication as prescribed, test blood sugar as directed, and to call between visits if there is a concern that blood sugar is uncontrolled is also discussed.   Helen Mahoney is reminded of the importance of daily foot exam, annual eye examination, and good blood sugar, blood pressure and cholesterol control.     Latest Ref Rng & Units 06/13/2023   10:22 AM 05/16/2023    8:31 AM 02/11/2023    8:13 AM 12/06/2022   10:51 AM 10/15/2022    9:08 AM  Diabetic Labs  HbA1c 4.8 - 5.6 %  7.1  8.3   8.4   Chol 100 - 199 mg/dL  98    161   HDL >09 mg/dL  46    50   Calc LDL 0 - 99 mg/dL  40    48   Triglycerides 0 - 149 mg/dL  50    77   Creatinine 0.44 - 1.00 mg/dL 6.04  5.40  9.81  1.91  1.27       09/23/2023    1:21 PM 08/21/2023    1:04 PM 05/21/2023   11:01 AM 05/21/2023    8:43 AM 04/08/2023   10:46 AM 02/18/2023    1:18 PM 02/18/2023   10:14 AM  BP/Weight  Systolic BP 119   119 109  104  Diastolic BP 72   73 69  69  Wt. (Lbs) 220 218 216 215 221 218 218.12  BMI 35.51 kg/m2 36.28 kg/m2 34.86 kg/m2 34.7 kg/m2 35.67 kg/m2 38.62 kg/m2 35.21 kg/m2      Latest Ref Rng & Units 02/27/2023   12:00 AM 02/18/2023   10:20 AM  Foot/eye exam completion dates  Eye Exam No Retinopathy No Retinopathy       Foot Form Completion   Done     This result is from an external source.      Updated lab needed

## 2023-09-23 NOTE — Assessment & Plan Note (Signed)
 Updated lab needed at/ before next visit.

## 2023-09-23 NOTE — Patient Instructions (Addendum)
 F/u IN 13 WEEKS, CALL IF YOU NEEED ME SOONER  HBA1C , bmp and egfr today and Urine ACR  Fasting lipid, cmp and EGFR and hBa1C  , TSH and vit D in 13 weeks, 1 week before next appointment  Pls schedule mammogram at checkout  You are referred to Dr Phyllis Breeze re right knee  6  month handicap sticker to be given to you today before tyou leave   3 months of mounjaro 5 mg weekly is prescribed , let us  know if a problem with getting this   Careful not to fall!   Thanks for choosing [facility], we consider it a privelige to serve you.

## 2023-09-23 NOTE — Assessment & Plan Note (Signed)
 Controlled, no change in medication DASH diet and commitment to daily physical activity for a minimum of 30 minutes discussed and encouraged, as a part of hypertension management. The importance of attaining a healthy weight is also discussed.     09/23/2023    1:21 PM 08/21/2023    1:04 PM 05/21/2023   11:01 AM 05/21/2023    8:43 AM 04/08/2023   10:46 AM 02/18/2023    1:18 PM 02/18/2023   10:14 AM  BP/Weight  Systolic BP 119   119 109  104  Diastolic BP 72   73 69  69  Wt. (Lbs) 220 218 216 215 221 218 218.12  BMI 35.51 kg/m2 36.28 kg/m2 34.86 kg/m2 34.7 kg/m2 35.67 kg/m2 38.62 kg/m2 35.21 kg/m2

## 2023-09-23 NOTE — Assessment & Plan Note (Signed)
 1 month h/o increased pain and instability, ortho eval

## 2023-09-24 ENCOUNTER — Ambulatory Visit: Payer: Self-pay | Admitting: Family Medicine

## 2023-09-25 LAB — HEMOGLOBIN A1C
Est. average glucose Bld gHb Est-mCnc: 174 mg/dL
Hgb A1c MFr Bld: 7.7 % — ABNORMAL HIGH (ref 4.8–5.6)

## 2023-09-25 LAB — BMP8+EGFR
BUN/Creatinine Ratio: 18 (ref 12–28)
BUN: 25 mg/dL (ref 8–27)
CO2: 21 mmol/L (ref 20–29)
Calcium: 9.3 mg/dL (ref 8.7–10.3)
Chloride: 103 mmol/L (ref 96–106)
Creatinine, Ser: 1.42 mg/dL — ABNORMAL HIGH (ref 0.57–1.00)
Glucose: 116 mg/dL — ABNORMAL HIGH (ref 70–99)
Potassium: 4.2 mmol/L (ref 3.5–5.2)
Sodium: 141 mmol/L (ref 134–144)
eGFR: 39 mL/min/{1.73_m2} — ABNORMAL LOW (ref >59)

## 2023-09-25 LAB — MICROALBUMIN / CREATININE URINE RATIO
Creatinine, Urine: 114.5 mg/dL
Microalb/Creat Ratio: 10 mg/g{creat} (ref 0–29)
Microalbumin, Urine: 11.2 ug/mL

## 2023-09-30 ENCOUNTER — Telehealth: Payer: Self-pay | Admitting: Pharmacy Technician

## 2023-09-30 ENCOUNTER — Other Ambulatory Visit (HOSPITAL_COMMUNITY): Payer: Self-pay

## 2023-09-30 ENCOUNTER — Encounter (HOSPITAL_COMMUNITY): Payer: Self-pay | Admitting: Internal Medicine

## 2023-09-30 NOTE — Telephone Encounter (Signed)
 Pharmacy Patient Advocate Encounter  Received notification from [employer] that Prior Authorization for Mounjaro  5MG /0.5ML auto-injectors has been APPROVED from 04/30/2023 to 04/28/2024. Unable to obtain price due to refill too soon rejection, last fill date 09/23/2023 next available fill date06/20/2025   PA #/Case ID/Reference #: Key: [id number]

## 2023-11-24 ENCOUNTER — Other Ambulatory Visit: Payer: Self-pay | Admitting: Family Medicine

## 2023-11-24 MED ORDER — DEXCOM G7 SENSOR MISC: 0 refills | Status: DC

## 2023-11-24 NOTE — Telephone Encounter (Signed)
 Copied from CRM [id number]. Topic: Clinical - Medication Refill >> Nov 24, 2023 11:47 AM Ivette P wrote: Medication: Continuous Glucose Sensor (DEXCOM G7 SENSOR) MISC   Has the patient contacted their pharmacy? Yes (Agent: If no, request that the patient contact the pharmacy for the refill. If patient does not wish to contact the pharmacy document the reason why and proceed with request.) (Agent: If yes, when and what did the pharmacy advise?)  This is the patient's preferred pharmacy:  [facility] [address] [place] - [address] [place] [address] Phone: [phone] Fax: [phone]  Is this the correct pharmacy for this prescription? Yes If no, delete pharmacy and type the correct one.   Has the prescription been filled recently? No  Is the patient out of the medication? Yes  Has the patient been seen for an appointment in the last year OR does the patient have an upcoming appointment? Yes  Can we respond through MyChart? No  Agent: Please be advised that Rx refills may take up to 3 business days. We ask that you follow-up with your pharmacy.

## 2023-11-27 ENCOUNTER — Other Ambulatory Visit: Payer: Self-pay | Admitting: Family Medicine

## 2023-12-08 DIAGNOSIS — E119 Type 2 diabetes mellitus without complications: Secondary | ICD-10-CM | POA: Diagnosis not present

## 2023-12-08 DIAGNOSIS — N189 Chronic kidney disease, unspecified: Secondary | ICD-10-CM | POA: Diagnosis not present

## 2023-12-08 DIAGNOSIS — R809 Proteinuria, unspecified: Secondary | ICD-10-CM | POA: Diagnosis not present

## 2023-12-08 DIAGNOSIS — I10 Essential (primary) hypertension: Secondary | ICD-10-CM | POA: Diagnosis not present

## 2023-12-08 DIAGNOSIS — D631 Anemia in chronic kidney disease: Secondary | ICD-10-CM | POA: Diagnosis not present

## 2023-12-11 ENCOUNTER — Inpatient Hospital Stay: Payer: Medicare HMO | Attending: Oncology

## 2023-12-11 DIAGNOSIS — D563 Thalassemia minor: Secondary | ICD-10-CM | POA: Insufficient documentation

## 2023-12-11 DIAGNOSIS — R7402 Elevation of levels of lactic acid dehydrogenase (LDH): Secondary | ICD-10-CM | POA: Insufficient documentation

## 2023-12-11 DIAGNOSIS — R768 Other specified abnormal immunological findings in serum: Secondary | ICD-10-CM | POA: Insufficient documentation

## 2023-12-11 DIAGNOSIS — D472 Monoclonal gammopathy: Secondary | ICD-10-CM | POA: Insufficient documentation

## 2023-12-11 DIAGNOSIS — D508 Other iron deficiency anemias: Secondary | ICD-10-CM | POA: Insufficient documentation

## 2023-12-11 DIAGNOSIS — Z79899 Other long term (current) drug therapy: Secondary | ICD-10-CM | POA: Diagnosis not present

## 2023-12-11 DIAGNOSIS — E538 Deficiency of other specified B group vitamins: Secondary | ICD-10-CM | POA: Diagnosis not present

## 2023-12-11 DIAGNOSIS — R778 Other specified abnormalities of plasma proteins: Secondary | ICD-10-CM

## 2023-12-11 DIAGNOSIS — N189 Chronic kidney disease, unspecified: Secondary | ICD-10-CM | POA: Diagnosis not present

## 2023-12-11 DIAGNOSIS — E559 Vitamin D deficiency, unspecified: Secondary | ICD-10-CM

## 2023-12-11 LAB — CBC WITH DIFFERENTIAL/PLATELET
Abs Immature Granulocytes: 0.03 K/uL (ref 0.00–0.07)
Basophils Absolute: 0 K/uL (ref 0.0–0.1)
Basophils Relative: 1 %
Eosinophils Absolute: 0.1 K/uL (ref 0.0–0.5)
Eosinophils Relative: 1 %
HCT: 34.4 % — ABNORMAL LOW (ref 36.0–46.0)
Hemoglobin: 10.4 g/dL — ABNORMAL LOW (ref 12.0–15.0)
Immature Granulocytes: 1 %
Lymphocytes Relative: 24 %
Lymphs Abs: 1.5 K/uL (ref 0.7–4.0)
MCH: 21.6 pg — ABNORMAL LOW (ref 26.0–34.0)
MCHC: 30.2 g/dL (ref 30.0–36.0)
MCV: 71.4 fL — ABNORMAL LOW (ref 80.0–100.0)
Monocytes Absolute: 0.9 K/uL (ref 0.1–1.0)
Monocytes Relative: 14 %
Neutro Abs: 3.8 K/uL (ref 1.7–7.7)
Neutrophils Relative %: 59 %
Platelets: 248 K/uL (ref 150–400)
RBC: 4.82 MIL/uL (ref 3.87–5.11)
RDW: 16 % — ABNORMAL HIGH (ref 11.5–15.5)
WBC: 6.3 K/uL (ref 4.0–10.5)
nRBC: 0 % (ref 0.0–0.2)

## 2023-12-11 LAB — LACTATE DEHYDROGENASE: LDH: 142 U/L (ref 98–192)

## 2023-12-11 LAB — COMPREHENSIVE METABOLIC PANEL WITH GFR
ALT: 20 U/L (ref 0–44)
AST: 26 U/L (ref 15–41)
Albumin: 3.3 g/dL — ABNORMAL LOW (ref 3.5–5.0)
Alkaline Phosphatase: 93 U/L (ref 38–126)
Anion gap: 9 (ref 5–15)
BUN: 22 mg/dL (ref 8–23)
CO2: 23 mmol/L (ref 22–32)
Calcium: 8.4 mg/dL — ABNORMAL LOW (ref 8.9–10.3)
Chloride: 101 mmol/L (ref 98–111)
Creatinine, Ser: 1.56 mg/dL — ABNORMAL HIGH (ref 0.44–1.00)
GFR, Estimated: 35 mL/min — ABNORMAL LOW (ref >60)
Glucose, Bld: 140 mg/dL — ABNORMAL HIGH (ref 70–99)
Potassium: 3.7 mmol/L (ref 3.5–5.1)
Sodium: 133 mmol/L — ABNORMAL LOW (ref 135–145)
Total Bilirubin: 0.5 mg/dL (ref 0.0–1.2)
Total Protein: 7.1 g/dL (ref 6.5–8.1)

## 2023-12-11 LAB — IRON AND TIBC
Iron: 21 ug/dL — ABNORMAL LOW (ref 28–170)
Saturation Ratios: 7 % — ABNORMAL LOW (ref 10.4–31.8)
TIBC: 290 ug/dL (ref 250–450)
UIBC: 269 ug/dL

## 2023-12-11 LAB — FERRITIN: Ferritin: 134 ng/mL (ref 11–307)

## 2023-12-11 LAB — VITAMIN D 25 HYDROXY (VIT D DEFICIENCY, FRACTURES): Vit D, 25-Hydroxy: 38.72 ng/mL (ref 30–100)

## 2023-12-11 LAB — VITAMIN B12: Vitamin B-12: 280 pg/mL (ref 180–914)

## 2023-12-11 LAB — FOLATE: Folate: >40 ng/mL (ref >5.9)

## 2023-12-15 DIAGNOSIS — D638 Anemia in other chronic diseases classified elsewhere: Secondary | ICD-10-CM | POA: Diagnosis not present

## 2023-12-15 DIAGNOSIS — E785 Hyperlipidemia, unspecified: Secondary | ICD-10-CM | POA: Diagnosis not present

## 2023-12-15 DIAGNOSIS — E1122 Type 2 diabetes mellitus with diabetic chronic kidney disease: Secondary | ICD-10-CM | POA: Diagnosis not present

## 2023-12-15 DIAGNOSIS — N1832 Chronic kidney disease, stage 3b: Secondary | ICD-10-CM | POA: Diagnosis not present

## 2023-12-15 DIAGNOSIS — I10 Essential (primary) hypertension: Secondary | ICD-10-CM | POA: Diagnosis not present

## 2023-12-15 DIAGNOSIS — Z794 Long term (current) use of insulin: Secondary | ICD-10-CM | POA: Diagnosis not present

## 2023-12-15 DIAGNOSIS — N2581 Secondary hyperparathyroidism of renal origin: Secondary | ICD-10-CM | POA: Diagnosis not present

## 2023-12-15 DIAGNOSIS — E559 Vitamin D deficiency, unspecified: Secondary | ICD-10-CM | POA: Diagnosis not present

## 2023-12-16 LAB — CMP14+EGFR
ALT: 24 IU/L (ref 0–32)
AST: 26 IU/L (ref 0–40)
Albumin: 3.8 g/dL (ref 3.8–4.8)
Alkaline Phosphatase: 107 IU/L (ref 44–121)
BUN/Creatinine Ratio: 20 (ref 12–28)
BUN: 31 mg/dL — ABNORMAL HIGH (ref 8–27)
Bilirubin Total: 0.2 mg/dL (ref 0.0–1.2)
CO2: 21 mmol/L (ref 20–29)
Calcium: 8.9 mg/dL (ref 8.7–10.3)
Chloride: 105 mmol/L (ref 96–106)
Creatinine, Ser: 1.54 mg/dL — ABNORMAL HIGH (ref 0.57–1.00)
Globulin, Total: 2.8 g/dL (ref 1.5–4.5)
Glucose: 69 mg/dL — ABNORMAL LOW (ref 70–99)
Potassium: 4.6 mmol/L (ref 3.5–5.2)
Sodium: 142 mmol/L (ref 134–144)
Total Protein: 6.6 g/dL (ref 6.0–8.5)
eGFR: 35 mL/min/1.73 — ABNORMAL LOW (ref >59)

## 2023-12-16 LAB — LIPID PANEL
Chol/HDL Ratio: 2.2 ratio (ref 0.0–4.4)
Cholesterol, Total: 106 mg/dL (ref 100–199)
HDL: 48 mg/dL (ref >39)
LDL Chol Calc (NIH): 44 mg/dL (ref 0–99)
Triglycerides: 63 mg/dL (ref 0–149)
VLDL Cholesterol Cal: 14 mg/dL (ref 5–40)

## 2023-12-16 LAB — HEMOGLOBIN A1C
Est. average glucose Bld gHb Est-mCnc: 174 mg/dL
Hgb A1c MFr Bld: 7.7 % — ABNORMAL HIGH (ref 4.8–5.6)

## 2023-12-16 LAB — TSH: TSH: 2.3 u[IU]/mL (ref 0.450–4.500)

## 2023-12-16 LAB — VITAMIN D 25 HYDROXY (VIT D DEFICIENCY, FRACTURES): Vit D, 25-Hydroxy: 37.3 ng/mL (ref 30.0–100.0)

## 2023-12-17 ENCOUNTER — Other Ambulatory Visit: Payer: Self-pay | Admitting: Family Medicine

## 2023-12-17 LAB — METHYLMALONIC ACID, SERUM: Methylmalonic Acid, Quantitative: 283 nmol/L (ref 0–378)

## 2023-12-18 ENCOUNTER — Inpatient Hospital Stay: Payer: Medicare HMO | Admitting: Oncology

## 2023-12-18 VITALS — BP 110/69 | HR 79 | Temp 97.5°F | Resp 18 | Ht 66.0 in | Wt 232.0 lb

## 2023-12-18 DIAGNOSIS — Z79899 Other long term (current) drug therapy: Secondary | ICD-10-CM | POA: Diagnosis not present

## 2023-12-18 DIAGNOSIS — D472 Monoclonal gammopathy: Secondary | ICD-10-CM | POA: Diagnosis not present

## 2023-12-18 DIAGNOSIS — D508 Other iron deficiency anemias: Secondary | ICD-10-CM

## 2023-12-18 DIAGNOSIS — E538 Deficiency of other specified B group vitamins: Secondary | ICD-10-CM | POA: Diagnosis not present

## 2023-12-18 DIAGNOSIS — R778 Other specified abnormalities of plasma proteins: Secondary | ICD-10-CM | POA: Diagnosis not present

## 2023-12-18 DIAGNOSIS — R7402 Elevation of levels of lactic acid dehydrogenase (LDH): Secondary | ICD-10-CM | POA: Diagnosis not present

## 2023-12-18 DIAGNOSIS — D563 Thalassemia minor: Secondary | ICD-10-CM | POA: Diagnosis not present

## 2023-12-18 DIAGNOSIS — R768 Other specified abnormal immunological findings in serum: Secondary | ICD-10-CM | POA: Diagnosis not present

## 2023-12-18 DIAGNOSIS — N189 Chronic kidney disease, unspecified: Secondary | ICD-10-CM | POA: Diagnosis not present

## 2023-12-18 NOTE — Assessment & Plan Note (Addendum)
-  Longstanding history of microcytic anemia.  Alpha thalassemia genotype study done on 08/17/2015. -Baseline hemoglobin ranges from 9.5-11. -Hemoglobin is stable at 10.4 today. -She does not need any intervention at this time.

## 2023-12-18 NOTE — Assessment & Plan Note (Addendum)
-  She is followed by Dr. Rachele for CKD. -Incidentally found to have elevated kappa and lambda light chains along with kappa lambda light chain ratio.  Urine immunofixation showed polyclonal light chains negative for monoclonal immunoglobulin or free light chain. -Hematology workup from 05/23/2022 showed SPEP negative, immunofixation shows polyclonal increase in immunoglobulins.  Mildly elevated beta-2  globulin in the setting of CKD LDH was normal. -She continues to deny any B symptoms. -Protein electrophoresis from 06/13/2023 was negative for M spike and immunofixation pattern appears unremarkable.  Evidence of monoclonal protein is not apparent.  Beta-2  microglobulin slightly elevated at 3.4 in the setting of CKD.  LDH is normal at 172.  Kappa lambda light chain ratio elevated at 1.98 with improvement from previous.  Both kappa free and lambda free light chains are elevated but significantly improved from previous. -Recommend we continue to monitor with labs every 12 months.

## 2023-12-18 NOTE — Patient Instructions (Signed)
 Recommend 2 doses of IV iron .   Continue B12 1000 mcg daily and 325 mg iron  tablets.   Continue Folic acid  tabs daily.

## 2023-12-18 NOTE — Progress Notes (Signed)
 [facility] OFFICE PROGRESS NOTE  Antonetta Rollene BRAVO, MD  ASSESSMENT & PLAN:    Assessment & Plan Other iron  deficiency anemia -Labs from 12/15/2023 showed a iron  saturation of 7%, TIBC 290 ferritin 134 and a hemoglobin of 10.4.  MCV 71.4. -She has not required IV iron  since 2017. -Given she had such a big drop in her iron  saturations, would recommend 2 doses of IV Feraheme  which is what she received back in 2017. -I would like her to continue taking oral iron , B12 and vitamin D . -She denies any melena, hematochezia or bright red blood per rectum. B12 deficiency -Has received B12 injections in the past although not recently. -Most recent labs show B12 level of 280 within normal MMA. -Continue oral B12 but we will add on B12 injections during her iron  infusions.  B12  level did drop a bit since March.   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  Alpha thalassemia genotype study done on 08/17/2015. -Baseline hemoglobin ranges from 9.5-11. -Hemoglobin is stable at 10.4 today. -She does not need any intervention at this time. Abnormal SPEP -She is followed by Dr. Rachele for CKD. -Incidentally found to have elevated kappa and lambda light chains along with kappa lambda light chain ratio.  Urine immunofixation showed polyclonal light chains negative for monoclonal immunoglobulin or free light chain. -Hematology workup from 05/23/2022 showed SPEP negative, immunofixation shows polyclonal increase in immunoglobulins.  Mildly elevated beta-2  globulin in the setting of CKD LDH was normal. -She continues to deny any B symptoms. -Protein electrophoresis from 06/13/2023 was negative for M spike and immunofixation pattern appears unremarkable.  Evidence of monoclonal protein is not apparent.  Beta-2  microglobulin slightly elevated at 3.4 in the setting of CKD.  LDH is normal at 172.  Kappa lambda light chain ratio elevated at 1.98 with improvement  from previous.  Both kappa free and lambda free light chains are elevated but significantly improved from previous. -Recommend we continue to monitor with labs every 12 months.  Orders Placed This Encounter  Procedures   Ferritin    Standing Status:   Future    Expected Date:   04/18/2024    Expiration Date:   07/17/2024   Iron  and TIBC ([facility] ONLY)    Standing Status:   Future    Expected Date:   04/18/2024    Expiration Date:   07/17/2024   Vitamin B12    Standing Status:   Future    Expected Date:   04/18/2024    Expiration Date:   07/17/2024   Folate    Standing Status:   Future    Expected Date:   04/18/2024    Expiration Date:   07/17/2024   CBC with Differential    Standing Status:   Future    Expected Date:   04/18/2024    Expiration Date:   07/17/2024   Comprehensive metabolic panel    Standing Status:   Future    Expected Date:   04/18/2024    Expiration Date:   07/17/2024   Methylmalonic acid, serum    Standing Status:   Future    Expected Date:   04/18/2024    Expiration Date:   07/17/2024   Vitamin D  25 hydroxy    Standing Status:   Future    Expected Date:   04/18/2024    Expiration Date:   07/17/2024   Lactate dehydrogenase    Standing Status:   Future    Expected  Date:   04/18/2024    Expiration Date:   07/17/2024    INTERVAL HISTORY: Patient returns for follow-up.  Patient is a 73 year old female who is followed by hematology for alpha thalassemia minor, iron  deficiency, B12 deficiency and MGUS.   She has not needed IV iron  since 2017.  She did receive monthly B12 injections for 6 months in 2021.   She denies any hospitalizations, surgeries or changes to her baseline health.   She is currently taking oral iron , B12 and vitamin D  daily.   She is followed closely by Dr. Rachele for her kidneys.   Reports overall feels well.  Denies any B symptoms.  Appetite and energy levels are 100%.  We reviewed CMP, CBC, iron  panel, ferritin and  vitamin D  level.  SUMMARY OF HEMATOLOGIC HISTORY: Oncology History   No history exists.     CBC    Component Value Date/Time   WBC 6.3 12/11/2023 1344   RBC 4.82 12/11/2023 1344   HGB 10.4 (L) 12/11/2023 1344   HGB 11.0 (L) 08/07/2021 1041   HCT 34.4 (L) 12/11/2023 1344   HCT 36.4 08/07/2021 1041   PLT 248 12/11/2023 1344   PLT 318 08/07/2021 1041   MCV 71.4 (L) 12/11/2023 1344   MCV 72 (L) 08/07/2021 1041   MCH 21.6 (L) 12/11/2023 1344   MCHC 30.2 12/11/2023 1344   RDW 16.0 (H) 12/11/2023 1344   RDW 15.9 (H) 08/07/2021 1041   LYMPHSABS 1.5 12/11/2023 1344   LYMPHSABS 2.4 08/14/2020 0914   MONOABS 0.9 12/11/2023 1344   EOSABS 0.1 12/11/2023 1344   EOSABS 0.4 08/14/2020 0914   BASOSABS 0.0 12/11/2023 1344   BASOSABS 0.1 08/14/2020 0914       Latest Ref Rng & Units 12/15/2023    8:37 AM 12/11/2023    1:44 PM 09/23/2023    2:20 PM  CMP  Glucose 70 - 99 mg/dL 69  859  883   BUN 8 - 27 mg/dL 31  22  25    Creatinine 0.57 - 1.00 mg/dL 8.45  8.43  8.57   Sodium 134 - 144 mmol/L 142  133  141   Potassium 3.5 - 5.2 mmol/L 4.6  3.7  4.2   Chloride 96 - 106 mmol/L 105  101  103   CO2 20 - 29 mmol/L 21  23  21    Calcium  8.7 - 10.3 mg/dL 8.9  8.4  9.3   Total Protein 6.0 - 8.5 g/dL 6.6  7.1    Total Bilirubin 0.0 - 1.2 mg/dL 0.2  0.5    Alkaline Phos 44 - 121 IU/L 107  93    AST 0 - 40 IU/L 26  26    ALT 0 - 32 IU/L 24  20       Lab Results  Component Value Date   FERRITIN 134 12/11/2023   VITAMINB12 280 12/11/2023    Vitals:   12/18/23 1408  BP: 110/69  Pulse: 79  Resp: 18  Temp: (!) 97.5 F (36.4 C)  SpO2: 98%    Review of System:  Review of Systems  All other systems reviewed and are negative.   Physical Exam: Physical Exam Constitutional:      Appearance: Normal appearance.  HENT:     Head: Normocephalic and atraumatic.  Eyes:     Pupils: Pupils are equal, round, and reactive to light.  Cardiovascular:     Rate and Rhythm: Normal rate and  regular rhythm.     Heart  sounds: Normal heart sounds. No murmur heard. Pulmonary:     Effort: Pulmonary effort is normal.     Breath sounds: Normal breath sounds. No wheezing.  Abdominal:     General: Bowel sounds are normal. There is no distension.     Palpations: Abdomen is soft.     Tenderness: There is no abdominal tenderness.  Musculoskeletal:        General: Normal range of motion.     Cervical back: Normal range of motion.  Skin:    General: Skin is warm and dry.     Findings: No rash.  Neurological:     Mental Status: She is alert and oriented to person, place, and time.     Gait: Gait is intact.  Psychiatric:        Mood and Affect: Mood and affect normal.        Cognition and Memory: Memory normal.        Judgment: Judgment normal.      I spent 25 minutes dedicated to the care of this patient (face-to-face and non-face-to-face) on the date of the encounter to include what is described in the assessment and plan.,  Delon Hope, NP 12/18/2023 7:41 PM

## 2023-12-18 NOTE — Assessment & Plan Note (Addendum)
-  Has received B12 injections in the past although not recently. -Most recent labs show B12 level of 280 within normal MMA. -Continue oral B12 but we will add on B12 injections during her iron  infusions.  B12  level did drop a bit since March.

## 2023-12-18 NOTE — Assessment & Plan Note (Addendum)
-  Labs from 12/15/2023 showed a iron  saturation of 7%, TIBC 290 ferritin 134 and a hemoglobin of 10.4.  MCV 71.4. -She has not required IV iron  since 2017. -Given she had such a big drop in her iron  saturations, would recommend 2 doses of IV Feraheme  which is what she received back in 2017. -I would like her to continue taking oral iron , B12 and vitamin D . -She denies any melena, hematochezia or bright red blood per rectum.

## 2023-12-25 ENCOUNTER — Ambulatory Visit (INDEPENDENT_AMBULATORY_CARE_PROVIDER_SITE_OTHER): Admitting: Family Medicine

## 2023-12-25 ENCOUNTER — Encounter: Payer: Self-pay | Admitting: Family Medicine

## 2023-12-25 VITALS — BP 111/70 | HR 86 | Resp 16 | Ht 66.0 in | Wt 225.0 lb

## 2023-12-25 DIAGNOSIS — E785 Hyperlipidemia, unspecified: Secondary | ICD-10-CM

## 2023-12-25 DIAGNOSIS — I10 Essential (primary) hypertension: Secondary | ICD-10-CM | POA: Diagnosis not present

## 2023-12-25 DIAGNOSIS — Z794 Long term (current) use of insulin: Secondary | ICD-10-CM

## 2023-12-25 DIAGNOSIS — N1832 Chronic kidney disease, stage 3b: Secondary | ICD-10-CM

## 2023-12-25 DIAGNOSIS — E1122 Type 2 diabetes mellitus with diabetic chronic kidney disease: Secondary | ICD-10-CM

## 2023-12-25 MED ORDER — LANTUS SOLOSTAR 100 UNIT/ML ~~LOC~~ SOPN: PEN_INJECTOR | SUBCUTANEOUS | 1 refills | Status: DC

## 2023-12-25 MED ORDER — TRULICITY 0.75 MG/0.5ML ~~LOC~~ SOAJ: 0.7500 mg | SUBCUTANEOUS | 4 refills | Status: AC

## 2023-12-25 NOTE — Patient Instructions (Signed)
 Annual exam in mid January  Pls schedule flu vaccine nurse visit in Septemebr  Increase lantus  to 30 units twice daily  Non fasting HBA1C, bmp and eGFr 3 to 5 days before Jan apt  Trulicity  is prescribed , let us   know if unable to afford please, call in today   It is important that you exercise regularly at least 30 minutes 5 times a week. If you develop chest pain, have severe difficulty breathing, or feel very tired, stop exercising immediately and seek medical attention   Thanks for choosing [facility], we consider it a privelige to serve you.

## 2023-12-28 ENCOUNTER — Telehealth: Payer: Self-pay | Admitting: Family Medicine

## 2023-12-28 NOTE — Assessment & Plan Note (Signed)
 Controlled, no change in medication DASH diet and commitment to daily physical activity for a minimum of 30 minutes discussed and encouraged, as a part of hypertension management. The importance of attaining a healthy weight is also discussed.     12/25/2023    8:18 AM 12/18/2023    2:08 PM 09/23/2023    1:21 PM 08/21/2023    1:04 PM 05/21/2023   11:01 AM 05/21/2023    8:43 AM 04/08/2023   10:46 AM  BP/Weight  Systolic BP 111 110 119   119 890  Diastolic BP 70 69 72   73 69  Wt. (Lbs) 225 232 220 218 216 215 221  BMI 36.32 kg/m2 37.45 kg/m2 35.51 kg/m2 36.28 kg/m2 34.86 kg/m2 34.7 kg/m2 35.67 kg/m2

## 2023-12-28 NOTE — Assessment & Plan Note (Signed)
 Uncontrolled unable to gert medication prescribed will switch back top trulicity  Increase dose of lantus  and split doses Diabetes associated with hypertension, hyperlipidemia, and obesity  Helen Mahoney is reminded of the importance of commitment to daily physical activity for 30 minutes or more, as able and the need to limit carbohydrate intake to 30 to 60 grams per meal to help with blood sugar control.   The need to take medication as prescribed, test blood sugar as directed, and to call between visits if there is a concern that blood sugar is uncontrolled is also discussed.   Helen Mahoney is reminded of the importance of daily foot exam, annual eye examination, and good blood sugar, blood pressure and cholesterol control.     Latest Ref Rng & Units 12/15/2023    8:37 AM 12/11/2023    1:44 PM 09/23/2023    2:20 PM 06/13/2023   10:22 AM 05/16/2023    8:31 AM  Diabetic Labs  HbA1c 4.8 - 5.6 % 7.7   7.7   7.1   Micro/Creat Ratio 0 - 29 mg/g creat   10     Chol 100 - 199 mg/dL 893     98   HDL >60 mg/dL 48     46   Calc LDL 0 - 99 mg/dL 44     40   Triglycerides 0 - 149 mg/dL 63     50   Creatinine 0.57 - 1.00 mg/dL 8.45  8.43  8.57  8.67  1.55       12/25/2023    8:18 AM 12/18/2023    2:08 PM 09/23/2023    1:21 PM 08/21/2023    1:04 PM 05/21/2023   11:01 AM 05/21/2023    8:43 AM 04/08/2023   10:46 AM  BP/Weight  Systolic BP 111 110 119   119 890  Diastolic BP 70 69 72   73 69  Wt. (Lbs) 225 232 220 218 216 215 221  BMI 36.32 kg/m2 37.45 kg/m2 35.51 kg/m2 36.28 kg/m2 34.86 kg/m2 34.7 kg/m2 35.67 kg/m2      Latest Ref Rng & Units 02/27/2023   12:00 AM 02/18/2023   10:20 AM  Foot/eye exam completion dates  Eye Exam No Retinopathy No Retinopathy       Foot Form Completion   Done     This result is from an external source.      Updated lab needed at/ before next visit.

## 2023-12-28 NOTE — Assessment & Plan Note (Signed)
 Hyperlipidemia:Low fat diet discussed and encouraged.   Lipid Panel  Lab Results  Component Value Date   CHOL 106 12/15/2023   HDL 48 12/15/2023   LDLCALC 44 12/15/2023   TRIG 63 12/15/2023   CHOLHDL 2.2 12/15/2023     Controlled, no change in medication

## 2023-12-28 NOTE — Assessment & Plan Note (Signed)
  Patient re-educated about  the importance of commitment to a  minimum of 150 minutes of exercise per week as able.  The importance of healthy food choices with portion control discussed, as well as eating regularly and within a 12 hour window most days. The need to choose clean , green food 50 to 75% of the time is discussed, as well as to make water  the primary drink and set a goal of 64 ounces water  daily.       12/25/2023    8:18 AM 12/18/2023    2:08 PM 09/23/2023    1:21 PM  Weight /BMI  Weight 225 lb 232 lb 220 lb  Height 5' 6 (1.676 m) 5' 6 (1.676 m) 5' 6 (1.676 m)  BMI 36.32 kg/m2 37.45 kg/m2 35.51 kg/m2

## 2023-12-28 NOTE — Telephone Encounter (Signed)
 Pals all pt , see if she has the trulicity  and if blood sugars are better on new regime and let  me know,thanks!

## 2023-12-28 NOTE — Progress Notes (Signed)
 Helen Mahoney     MRN: [id number]      DOB: Jul 10, 1950  Chief Complaint  Patient presents with   Medical Management of Chronic Issues    Follow up     HPI Helen Mahoney is here for follow up and re-evaluation of chronic medical conditions, medication management and review of any available recent lab and radiology data.  Preventive health is updated, specifically  Cancer screening and Immunization.   Questions or concerns regarding consultations or procedures which the PT has had in the interim are  addressed. The PT denies any adverse reactions to current medications since the last visit.  There are no new concerns.  There are no specific complaints   ROS Denies recent fever or chills. Denies sinus pressure, nasal congestion, ear pain or sore throat. Denies chest congestion, productive cough or wheezing. Denies chest pains, palpitations and leg swelling Denies abdominal pain, nausea, vomiting,diarrhea or constipation.   Denies dysuria, frequency, hesitancy or incontinence. Denies joint pain, swelling and limitation in mobility. Denies headaches, seizures, numbness, or tingling. Denies depression, anxiety or insomnia. Denies skin break down or rash.   PE  BP 111/70   Pulse 86   Resp 16   Ht 5' 6 (1.676 m)   Wt 225 lb (102.1 kg)   SpO2 94%   BMI 36.32 kg/m   Patient alert and oriented and in no cardiopulmonary distress.  HEENT: No facial asymmetry, EOMI,     Neck supple .  Chest: Clear to auscultation bilaterally.  CVS: S1, S2 no murmurs, no S3.Regular rate.  ABD: Soft non tender.   Ext: No edema  MS: Adequate ROM spine, shoulders, hips and knees.  Skin: Intact, no ulcerations or rash noted.  Psych: Good eye contact, normal affect. Memory intact not anxious or depressed appearing.  CNS: CN 2-12 intact, power,  normal throughout.no focal deficits noted.   Assessment & Plan  Essential hypertension Controlled, no change in medication DASH diet and  commitment to daily physical activity for a minimum of 30 minutes discussed and encouraged, as a part of hypertension management. The importance of attaining a healthy weight is also discussed.     12/25/2023    8:18 AM 12/18/2023    2:08 PM 09/23/2023    1:21 PM 08/21/2023    1:04 PM 05/21/2023   11:01 AM 05/21/2023    8:43 AM 04/08/2023   10:46 AM  BP/Weight  Systolic BP 111 110 119   119 890  Diastolic BP 70 69 72   73 69  Wt. (Lbs) 225 232 220 218 216 215 221  BMI 36.32 kg/m2 37.45 kg/m2 35.51 kg/m2 36.28 kg/m2 34.86 kg/m2 34.7 kg/m2 35.67 kg/m2       Morbid obesity  Patient re-educated about  the importance of commitment to a  minimum of 150 minutes of exercise per week as able.  The importance of healthy food choices with portion control discussed, as well as eating regularly and within a 12 hour window most days. The need to choose clean , green food 50 to 75% of the time is discussed, as well as to make water  the primary drink and set a goal of 64 ounces water  daily.       12/25/2023    8:18 AM 12/18/2023    2:08 PM 09/23/2023    1:21 PM  Weight /BMI  Weight 225 lb 232 lb 220 lb  Height 5' 6 (1.676 m) 5' 6 (1.676 m) 5' 6 (1.676 m)  BMI 36.32 kg/m2 37.45 kg/m2 35.51 kg/m2      Diabetes mellitus with kidney complication, with long-term current use of insulin  (HCC) Uncontrolled unable to gert medication prescribed will switch back top trulicity  Increase dose of lantus  and split doses Diabetes associated with hypertension, hyperlipidemia, and obesity  Helen Mahoney is reminded of the importance of commitment to daily physical activity for 30 minutes or more, as able and the need to limit carbohydrate intake to 30 to 60 grams per meal to help with blood sugar control.   The need to take medication as prescribed, test blood sugar as directed, and to call between visits if there is a concern that blood sugar is uncontrolled is also discussed.   Helen Mahoney is reminded  of the importance of daily foot exam, annual eye examination, and good blood sugar, blood pressure and cholesterol control.     Latest Ref Rng & Units 12/15/2023    8:37 AM 12/11/2023    1:44 PM 09/23/2023    2:20 PM 06/13/2023   10:22 AM 05/16/2023    8:31 AM  Diabetic Labs  HbA1c 4.8 - 5.6 % 7.7   7.7   7.1   Micro/Creat Ratio 0 - 29 mg/g creat   10     Chol 100 - 199 mg/dL 893     98   HDL >60 mg/dL 48     46   Calc LDL 0 - 99 mg/dL 44     40   Triglycerides 0 - 149 mg/dL 63     50   Creatinine 0.57 - 1.00 mg/dL 8.45  8.43  8.57  8.67  1.55       12/25/2023    8:18 AM 12/18/2023    2:08 PM 09/23/2023    1:21 PM 08/21/2023    1:04 PM 05/21/2023   11:01 AM 05/21/2023    8:43 AM 04/08/2023   10:46 AM  BP/Weight  Systolic BP 111 110 119   119 890  Diastolic BP 70 69 72   73 69  Wt. (Lbs) 225 232 220 218 216 215 221  BMI 36.32 kg/m2 37.45 kg/m2 35.51 kg/m2 36.28 kg/m2 34.86 kg/m2 34.7 kg/m2 35.67 kg/m2      Latest Ref Rng & Units 02/27/2023   12:00 AM 02/18/2023   10:20 AM  Foot/eye exam completion dates  Eye Exam No Retinopathy No Retinopathy       Foot Form Completion   Done     This result is from an external source.      Updated lab needed at/ before next visit.   Hyperlipidemia LDL goal <70 Hyperlipidemia:Low fat diet discussed and encouraged.   Lipid Panel  Lab Results  Component Value Date   CHOL 106 12/15/2023   HDL 48 12/15/2023   LDLCALC 44 12/15/2023   TRIG 63 12/15/2023   CHOLHDL 2.2 12/15/2023     Controlled, no change in medication

## 2023-12-30 ENCOUNTER — Telehealth: Payer: Self-pay

## 2023-12-30 ENCOUNTER — Other Ambulatory Visit: Payer: Self-pay

## 2023-12-30 DIAGNOSIS — N1832 Chronic kidney disease, stage 3b: Secondary | ICD-10-CM

## 2023-12-30 NOTE — Telephone Encounter (Signed)
 Sent!

## 2023-12-30 NOTE — Telephone Encounter (Signed)
 Copied from CRM #[id number]. Topic: Referral - Status >> Dec 30, 2023  2:59 PM Ivette P wrote: Reason for CRM: Kya called in to report if a new referral can be sent to office before pt appt tomorrow 09/03.  Can be sent via epic.   [phone] - secured line

## 2023-12-31 ENCOUNTER — Encounter: Attending: Family Medicine | Admitting: Nutrition

## 2023-12-31 ENCOUNTER — Other Ambulatory Visit: Payer: Self-pay | Admitting: Family Medicine

## 2023-12-31 ENCOUNTER — Encounter: Payer: Self-pay | Admitting: Nutrition

## 2023-12-31 VITALS — Ht 66.0 in | Wt 224.6 lb

## 2023-12-31 DIAGNOSIS — E559 Vitamin D deficiency, unspecified: Secondary | ICD-10-CM | POA: Insufficient documentation

## 2023-12-31 DIAGNOSIS — I10 Essential (primary) hypertension: Secondary | ICD-10-CM | POA: Insufficient documentation

## 2023-12-31 DIAGNOSIS — E538 Deficiency of other specified B group vitamins: Secondary | ICD-10-CM | POA: Insufficient documentation

## 2023-12-31 DIAGNOSIS — E1159 Type 2 diabetes mellitus with other circulatory complications: Secondary | ICD-10-CM | POA: Insufficient documentation

## 2023-12-31 DIAGNOSIS — E785 Hyperlipidemia, unspecified: Secondary | ICD-10-CM | POA: Diagnosis not present

## 2023-12-31 NOTE — Telephone Encounter (Unsigned)
 Copied from CRM [id number]. Topic: Clinical - Medication Refill >> Dec 31, 2023  1:24 PM Carla L wrote: Medication:  Continuous Glucose Sensor (DEXCOM G7 SENSOR) MISC   Has the patient contacted their pharmacy? Yes Contact office for further refills  This is the patient's preferred pharmacy:  [facility] [address], [place] - [address] [place] [address] [place] [address] Phone: [phone] Fax: [phone]   Is this the correct pharmacy for this prescription? Yes If no, delete pharmacy and type the correct one.   Has the prescription been filled recently? No  Is the patient out of the medication? Yes  Has the patient been seen for an appointment in the last year OR does the patient have an upcoming appointment? Yes  Can we respond through MyChart? No  Agent: Please be advised that Rx refills may take up to 3 business days. We ask that you follow-up with your pharmacy.

## 2023-12-31 NOTE — Progress Notes (Signed)
 Medical Nutrition Therapy  Appointment Start time:  1300 Appointment End time:  1330  Primary concerns today: Obesity and DM Type 2, CKD Referral diagnosis: E11.8, E66.9, N18.3 Preferred learning style: No preference.  Learning readiness: Change in progress    NUTRITION ASSESSMENT   Dm Follow up  A1C 7.7%, the same as it was 3 months ago. PCP Dr. Antonetta CKD eGFR 35. Creatine 1.54. Sees Dr. Rachele. He said my kidneys were doing ok. Needs refills on her dexcom sensors.  Had some low blood sugars and then high blood sugars per her Herlene. 59% TIR, 23% TAR, 15% Very high. 3% low. She recently saw PCP. Lantus  30 units BID, Glipizide  10 mg BID and recently prescribed Trulicity  weekly. She notes she was on Trulicity  in the past. Diet would benefit from more nutrient dense whole foods and less processed foods. Diet is high in salt, fat and processed foods. Needs to cut out sodas and junk food Isn't walking. Has CKD Stg 3.Not on a Mounjero anymore. Gained 6 lbs since I was her in April.  Hypoglycemia episodes are most likely related to her  10 mg of Glipizide  taken at night and not her insulin . Will see if we stop or reduce her night time Glipizide  to help with hypoglycemia. Ultimatley would benefit from going back to 1 shot of insulin  a day for better coverage and BS control.  Needs better diet compliance and cravings. Trulicity  may help.   Wt Readings from Last 3 Encounters:  12/25/23 225 lb (102.1 kg)  12/18/23 232 lb (105.2 kg)  09/23/23 220 lb (99.8 kg)   Ht Readings from Last 3 Encounters:  12/25/23 5' 6 (1.676 m)  12/18/23 5' 6 (1.676 m)  09/23/23 5' 6 (1.676 m)   There is no height or weight on file to calculate BMI. @BMIFA @ Facility age limit for growth %iles is 20 years. Facility age limit for growth %iles is 20 years.  Lab Results  Component Value Date   HGBA1C 7.7 (H) 12/15/2023    Anthropometrics  Wt Readings from Last 3 Encounters:  12/25/23 225 lb (102.1  kg)  12/18/23 232 lb (105.2 kg)  09/23/23 220 lb (99.8 kg)   Ht Readings from Last 3 Encounters:  12/25/23 5' 6 (1.676 m)  12/18/23 5' 6 (1.676 m)  09/23/23 5' 6 (1.676 m)   There is no height or weight on file to calculate BMI. @BMIFA @ Facility age limit for growth %iles is 20 years. Facility age limit for growth %iles is 20 years.    Clinical Medical Hx: See chart Medications: Lantus  50 units at night, Glipizide  Labs:  Lab Results  Component Value Date   HGBA1C 7.7 (H) 12/15/2023       Latest Ref Rng & Units 12/15/2023    8:37 AM 12/11/2023    1:44 PM 09/23/2023    2:20 PM  CMP  Glucose 70 - 99 mg/dL 69  859  883   BUN 8 - 27 mg/dL 31  22  25    Creatinine 0.57 - 1.00 mg/dL 8.45  8.43  8.57   Sodium 134 - 144 mmol/L 142  133  141   Potassium 3.5 - 5.2 mmol/L 4.6  3.7  4.2   Chloride 96 - 106 mmol/L 105  101  103   CO2 20 - 29 mmol/L 21  23  21    Calcium  8.7 - 10.3 mg/dL 8.9  8.4  9.3   Total Protein 6.0 - 8.5 g/dL 6.6  7.1  Total Bilirubin 0.0 - 1.2 mg/dL 0.2  0.5    Alkaline Phos 44 - 121 IU/L 107  93    AST 0 - 40 IU/L 26  26    ALT 0 - 32 IU/L 24  20     Lipid Panel     Component Value Date/Time   CHOL 106 12/15/2023 0837   TRIG 63 12/15/2023 0837   HDL 48 12/15/2023 0837   CHOLHDL 2.2 12/15/2023 0837   CHOLHDL 3.3 03/30/2019 0914   VLDL 29 11/04/2016 1033   LDLCALC 44 12/15/2023 0837   LDLCALC 106 (H) 03/30/2019 0914   LABVLDL 14 12/15/2023 0837    Notable Signs/Symptoms: Tired, craves sweets  Lifestyle & Dietary Hx Lives by herself. Cooks at home and eats out.  Estimated daily fluid intake: 30 oz Supplements:  Sleep: 6-7  Stress / self-care:  Current average weekly physical activity: ADL  24-Hr Dietary Recall B) banana, boiled egg L) CHinese -beef and mushrooms, rice, Coke zero D) 2 hot dogs with bread 2 slices bread and mayonnaise, Coke zero  Estimated Energy Needs Calories: 1400 Carbohydrate: 158g Protein: 105g Fat:  39g   NUTRITION DIAGNOSIS  NB-1.1 Food and nutrition-related knowledge deficit As related to Diabetes Type 2.  As evidenced by A1C 10.2%.   NUTRITION INTERVENTION  Nutrition education (E-1) on the following topics:  Nutrition and Diabetes education provided on My Plate, CHO counting, meal planning, portion sizes, timing of meals, avoiding snacks between meals unless having a low blood sugar, target ranges for A1C and blood sugars, signs/symptoms and treatment of hyper/hypoglycemia, monitoring blood sugars, taking medications as prescribed, benefits of exercising 30 minutes per day and prevention of complications of DM.  Lifestyle Medicine  - Whole Food, Plant Predominant Nutrition is highly recommended: Eat Plenty of vegetables, Mushrooms, fruits, Legumes, Whole Grains, Nuts, seeds in lieu of processed meats, processed snacks/pastries red meat, poultry, eggs.    -It is better to avoid simple carbohydrates including: Cakes, Sweet Desserts, Ice Cream, Soda (diet and regular), Sweet Tea, Candies, Chips, Cookies, Store Bought Juices, Alcohol in Excess of  1-2 drinks a day, Lemonade,  Artificial Sweeteners, Doughnuts, Coffee Creamers, Sugar-free Products, etc, etc.  This is not a complete list.....  Exercise: If you are able: 30 -60 minutes a day ,4 days a week, or 150 minutes a week.  The longer the better.  Combine stretch, strength, and aerobic activities.  If you were told in the past that you have high risk for cardiovascular diseases, you may seek evaluation by your heart doctor prior to initiating moderate to intense exercise programs.    Handouts Provided Include  Lifestyle Medicine Dexcom insturctions  Learning Style & Readiness for Change Teaching method utilized: Visual & Auditory  Demonstrated degree of understanding via: Teach Back  Barriers to learning/adherence to lifestyle change: None  Goals Established by Pt Goals  Reduce to 1 coke zero a day and 4 bottles of water  per  day Cut out chinese and processed foods of hot dogs and focus on more fruits, vegetables and lean chicken, fish or [place]. Walk for 30 minutes aday Get A1C down to 7% Stop Glipizide  10 mg at night to see if it prevents low blood sugar in am. Keep the 10 mg of Glipizide  in the morning. Start the Trulicity  when you get it Pick up new sensors.   MONITORING & EVALUATION Dietary intake, weekly physical activity, and weight and BS in 3 month.  Next Steps  Patient is to work  on eating more plant based foods and cut out chips and sodas.SABRA

## 2023-12-31 NOTE — Patient Instructions (Addendum)
 Goals  Reduce to 1 coke zero a day and 4 bottles of water  per day Cut out chinese and processed foods of hot dogs and focus on more fruits, vegetables and lean chicken, fish or [place]. Walk for 30 minutes aday Get A1C down to 7% Stop Glipizide  10 mg at night to see if it prevents low blood sugar in am. Keep the 10 mg of Glipizide  in the morning. Start the Trulicity  when you get it Pick up new sensors.

## 2024-01-01 MED ORDER — DEXCOM G7 SENSOR MISC: 0 refills | Status: DC

## 2024-01-02 ENCOUNTER — Inpatient Hospital Stay: Attending: Oncology

## 2024-01-02 ENCOUNTER — Inpatient Hospital Stay

## 2024-01-02 VITALS — BP 121/61 | HR 68 | Temp 97.4°F | Resp 18

## 2024-01-02 DIAGNOSIS — Z79899 Other long term (current) drug therapy: Secondary | ICD-10-CM | POA: Insufficient documentation

## 2024-01-02 DIAGNOSIS — E538 Deficiency of other specified B group vitamins: Secondary | ICD-10-CM

## 2024-01-02 DIAGNOSIS — D508 Other iron deficiency anemias: Secondary | ICD-10-CM | POA: Insufficient documentation

## 2024-01-02 MED ORDER — SODIUM CHLORIDE 0.9 % IV SOLN
510.0000 mg | Freq: Once | INTRAVENOUS | Status: AC
  Administered 2024-01-02: 510 mg via INTRAVENOUS
  Filled 2024-01-02: qty 510

## 2024-01-02 MED ORDER — SODIUM CHLORIDE 0.9 % IV SOLN: INTRAVENOUS | Status: DC

## 2024-01-02 MED ORDER — ACETAMINOPHEN 325 MG PO TABS
650.0000 mg | ORAL_TABLET | Freq: Once | ORAL | Status: AC
  Administered 2024-01-02: 650 mg via ORAL
  Filled 2024-01-02: qty 2

## 2024-01-02 MED ORDER — CETIRIZINE HCL 10 MG PO TABS
10.0000 mg | ORAL_TABLET | Freq: Once | ORAL | Status: AC
  Administered 2024-01-02: 10 mg via ORAL
  Filled 2024-01-02: qty 1

## 2024-01-02 MED ORDER — CYANOCOBALAMIN 1000 MCG/ML IJ SOLN
1000.0000 ug | Freq: Once | INTRAMUSCULAR | Status: AC
  Administered 2024-01-02: 1000 ug via INTRAMUSCULAR
  Filled 2024-01-02: qty 1

## 2024-01-02 NOTE — Progress Notes (Signed)
 Patient tolerated iron infusion with no complaints voiced.  Peripheral IV site clean and dry with good blood return noted before and after infusion.  Band aid applied.  VSS with discharge and left in satisfactory condition with no s/s of distress noted.

## 2024-01-02 NOTE — Telephone Encounter (Signed)
 Lvm to cb

## 2024-01-02 NOTE — Patient Instructions (Signed)
 [facility] [place] - A DEPT OF [facility]  Discharge Instructions: Thank you for choosing [facility] to provide your oncology and hematology care.  If you have a lab appointment with the Cancer Center - please note that after April 8th, 2024, all labs will be drawn in the cancer center.  You do not have to check in or register with the main entrance as you have in the past but will complete your check-in in the cancer center.  Wear comfortable clothing and clothing appropriate for easy access to any Portacath or PICC line.   We strive to give you quality time with your provider. You may need to reschedule your appointment if you arrive late (15 or more minutes).  Arriving late affects you and other patients whose appointments are after yours.  Also, if you miss three or more appointments without notifying the office, you may be dismissed from the clinic at the provider's discretion.      For prescription refill requests, have your pharmacy contact our office and allow 72 hours for refills to be completed.    Ferumoxytol Injection What is this medication? FERUMOXYTOL (FER ue MOX i tol) treats low levels of iron in your body (iron deficiency anemia). Iron is a mineral that plays an important role in making red blood cells, which carry oxygen from your lungs to the rest of your body. This medicine may be used for other purposes; ask your health care provider or pharmacist if you have questions. COMMON BRAND NAME(S): Feraheme What should I tell my care team before I take this medication? They need to know if you have any of these conditions: Anemia not caused by low iron levels High levels of iron in the blood Magnetic resonance imaging (MRI) test scheduled An unusual or allergic reaction to iron, other medications, foods, dyes, or preservatives Pregnant or trying to get pregnant Breastfeeding How should I use this medication? This medication is injected  into a vein. It is given by your care team in a hospital or clinic setting. Talk to your care team the use of this medication in children. Special care may be needed. Overdosage: If you think you have taken too much of this medicine contact a poison control center or emergency room at once. NOTE: This medicine is only for you. Do not share this medicine with others. What if I miss a dose? It is important not to miss your dose. Call your care team if you are unable to keep an appointment. What may interact with this medication? Other iron products This list may not describe all possible interactions. Give your health care provider a list of all the medicines, herbs, non-prescription drugs, or dietary supplements you use. Also tell them if you smoke, drink alcohol, or use illegal drugs. Some items may interact with your medicine. What should I watch for while using this medication? Visit your care team for regular checks on your progress. Tell your care team if your symptoms do not start to get better or if they get worse. You may need blood work done while you are taking this medication. You may need to eat more foods that contain iron. Talk to your care team. Foods that contain iron include whole grains or cereals, dried fruits, beans, peas, leafy green vegetables, and organ meats (liver, kidney). What side effects may I notice from receiving this medication? Side effects that you should report to your care team as soon as  possible: Allergic reactions--skin rash, itching, hives, swelling of the face, lips, tongue, or throat Low blood pressure--dizziness, feeling faint or lightheaded, blurry vision Shortness of breath Side effects that usually do not require medical attention (report to your care team if they continue or are bothersome): Flushing Headache Joint pain Muscle pain Nausea Pain, redness, or irritation at injection site This list may not describe all possible side effects. Call  your doctor for medical advice about side effects. You may report side effects to FDA at 1-800-FDA-1088. Where should I keep my medication? This medication is given in a hospital or clinic. It will not be stored at home. NOTE: This sheet is a summary. It may not cover all possible information. If you have questions about this medicine, talk to your doctor, pharmacist, or health care provider.  2024 Elsevier/Gold Standard (2022-12-04 00:00:00)   To help prevent nausea and vomiting after your treatment, we encourage you to take your nausea medication as directed.  BELOW ARE SYMPTOMS THAT SHOULD BE REPORTED IMMEDIATELY: *FEVER GREATER THAN 100.4 F (38 C) OR HIGHER *CHILLS OR SWEATING *NAUSEA AND VOMITING THAT IS NOT CONTROLLED WITH YOUR NAUSEA MEDICATION *UNUSUAL SHORTNESS OF BREATH *UNUSUAL BRUISING OR BLEEDING *URINARY PROBLEMS (pain or burning when urinating, or frequent urination) *BOWEL PROBLEMS (unusual diarrhea, constipation, pain near the anus) TENDERNESS IN MOUTH AND THROAT WITH OR WITHOUT PRESENCE OF ULCERS (sore throat, sores in mouth, or a toothache) UNUSUAL RASH, SWELLING OR PAIN  UNUSUAL VAGINAL DISCHARGE OR ITCHING   Items with * indicate a potential emergency and should be followed up as soon as possible or go to the Emergency Department if any problems should occur.  Please show the CHEMOTHERAPY ALERT CARD or IMMUNOTHERAPY ALERT CARD at check-in to the Emergency Department and triage nurse.  Should you have questions after your visit or need to cancel or reschedule your appointment, please contact [facility] [place] - A DEPT OF [facility] [phone]  and follow the prompts.  Office hours are 8:00 a.m. to 4:30 p.m. Monday - Friday. Please note that voicemails left after 4:00 p.m. may not be returned until the following business day.  We are closed weekends and major holidays. You have access to a nurse at all times for urgent questions. Please call  the main number to the clinic [phone] and follow the prompts.  For any non-urgent questions, you may also contact your provider using MyChart. We now offer e-Visits for anyone 31 and older to request care online for non-urgent symptoms. For details visit [email or web address].   Also download the MyChart app! Go to the app store, search "MyChart", open the app, select [place], and log in with your MyChart username and password.

## 2024-01-09 ENCOUNTER — Inpatient Hospital Stay

## 2024-01-09 VITALS — BP 95/62 | HR 68 | Temp 97.4°F | Resp 18

## 2024-01-09 DIAGNOSIS — Z79899 Other long term (current) drug therapy: Secondary | ICD-10-CM | POA: Diagnosis not present

## 2024-01-09 DIAGNOSIS — E538 Deficiency of other specified B group vitamins: Secondary | ICD-10-CM

## 2024-01-09 DIAGNOSIS — D508 Other iron deficiency anemias: Secondary | ICD-10-CM | POA: Diagnosis not present

## 2024-01-09 MED ORDER — CYANOCOBALAMIN 1000 MCG/ML IJ SOLN
1000.0000 ug | Freq: Once | INTRAMUSCULAR | Status: AC
  Administered 2024-01-09: 1000 ug via INTRAMUSCULAR
  Filled 2024-01-09: qty 1

## 2024-01-09 MED ORDER — CETIRIZINE HCL 10 MG PO TABS
10.0000 mg | ORAL_TABLET | Freq: Once | ORAL | Status: AC
  Administered 2024-01-09: 10 mg via ORAL
  Filled 2024-01-09: qty 1

## 2024-01-09 MED ORDER — SODIUM CHLORIDE 0.9 % IV SOLN
510.0000 mg | Freq: Once | INTRAVENOUS | Status: AC
  Administered 2024-01-09: 510 mg via INTRAVENOUS
  Filled 2024-01-09: qty 510

## 2024-01-09 MED ORDER — ACETAMINOPHEN 325 MG PO TABS
650.0000 mg | ORAL_TABLET | Freq: Once | ORAL | Status: AC
  Administered 2024-01-09: 650 mg via ORAL
  Filled 2024-01-09: qty 2

## 2024-01-09 MED ORDER — SODIUM CHLORIDE 0.9 % IV SOLN: INTRAVENOUS | Status: DC

## 2024-01-09 NOTE — Patient Instructions (Signed)
 [facility] [place] - A DEPT OF [place]  Discharge Instructions: Thank you for choosing [facility] to provide your oncology and hematology care.  If you have a lab appointment with the Cancer Center - please note that after April 8th, 2024, all labs will be drawn in the cancer center.  You do not have to check in or register with the main entrance as you have in the past but will complete your check-in in the cancer center.  Wear comfortable clothing and clothing appropriate for easy access to any Portacath or PICC line.   We strive to give you quality time with your provider. You may need to reschedule your appointment if you arrive late (15 or more minutes).  Arriving late affects you and other patients whose appointments are after yours.  Also, if you miss three or more appointments without notifying the office, you may be dismissed from the clinic at the provider's discretion.      For prescription refill requests, have your pharmacy contact our office and allow 72 hours for refills to be completed.    Today you received the following chemotherapy and/or immunotherapy agents Feraheme , B12      To help prevent nausea and vomiting after your treatment, we encourage you to take your nausea medication as directed.  BELOW ARE SYMPTOMS THAT SHOULD BE REPORTED IMMEDIATELY: *FEVER GREATER THAN 100.4 F (38 C) OR HIGHER *CHILLS OR SWEATING *NAUSEA AND VOMITING THAT IS NOT CONTROLLED WITH YOUR NAUSEA MEDICATION *UNUSUAL SHORTNESS OF BREATH *UNUSUAL BRUISING OR BLEEDING *URINARY PROBLEMS (pain or burning when urinating, or frequent urination) *BOWEL PROBLEMS (unusual diarrhea, constipation, pain near the anus) TENDERNESS IN MOUTH AND THROAT WITH OR WITHOUT PRESENCE OF ULCERS (sore throat, sores in mouth, or a toothache) UNUSUAL RASH, SWELLING OR PAIN  UNUSUAL VAGINAL DISCHARGE OR ITCHING   Items with * indicate a potential emergency and should be  followed up as soon as possible or go to the Emergency Department if any problems should occur.  Please show the CHEMOTHERAPY ALERT CARD or IMMUNOTHERAPY ALERT CARD at check-in to the Emergency Department and triage nurse.  Should you have questions after your visit or need to cancel or reschedule your appointment, please contact [facility] [place] - A DEPT OF [facility] [phone]  and follow the prompts.  Office hours are 8:00 a.m. to 4:30 p.m. Monday - Friday. Please note that voicemails left after 4:00 p.m. may not be returned until the following business day.  We are closed weekends and major holidays. You have access to a nurse at all times for urgent questions. Please call the main number to the clinic [phone] and follow the prompts.  For any non-urgent questions, you may also contact your provider using MyChart. We now offer e-Visits for anyone 43 and older to request care online for non-urgent symptoms. For details visit [email or web address].   Also download the MyChart app! Go to the app store, search MyChart, open the app, select [place], and log in with your MyChart username and password.

## 2024-01-09 NOTE — Progress Notes (Signed)
 Patient presents today for Feraheme  infusion and B12 injection per providers order.  Vital signs WNL.  Patient has no new complaints at this time.  Peripheral IV started and blood return noted pre and post infusion.  Stable during infusion without adverse affects.  Vital signs stable.  No complaints at this time.  Discharge from clinic ambulatory in stable condition.  Alert and oriented X 3.  Follow up with [facility] as scheduled.

## 2024-01-12 ENCOUNTER — Ambulatory Visit

## 2024-01-29 ENCOUNTER — Encounter: Attending: Family Medicine | Admitting: Nutrition

## 2024-01-29 ENCOUNTER — Telehealth: Payer: Self-pay | Admitting: Family Medicine

## 2024-01-29 VITALS — Ht 66.0 in | Wt 223.4 lb

## 2024-01-29 DIAGNOSIS — E559 Vitamin D deficiency, unspecified: Secondary | ICD-10-CM

## 2024-01-29 DIAGNOSIS — Z794 Long term (current) use of insulin: Secondary | ICD-10-CM | POA: Diagnosis not present

## 2024-01-29 DIAGNOSIS — E1159 Type 2 diabetes mellitus with other circulatory complications: Secondary | ICD-10-CM | POA: Insufficient documentation

## 2024-01-29 DIAGNOSIS — I10 Essential (primary) hypertension: Secondary | ICD-10-CM

## 2024-01-29 DIAGNOSIS — N183 Chronic kidney disease, stage 3 unspecified: Secondary | ICD-10-CM | POA: Insufficient documentation

## 2024-01-29 DIAGNOSIS — E785 Hyperlipidemia, unspecified: Secondary | ICD-10-CM

## 2024-01-29 DIAGNOSIS — E538 Deficiency of other specified B group vitamins: Secondary | ICD-10-CM

## 2024-01-29 DIAGNOSIS — E1122 Type 2 diabetes mellitus with diabetic chronic kidney disease: Secondary | ICD-10-CM | POA: Insufficient documentation

## 2024-01-29 NOTE — Telephone Encounter (Signed)
 Patient seen diabetic provider today ND said Dr Antonetta had patient on 2 glipizide  and endocrinologist changed it to 1. Numbers are still low.  Can dr simpson adjust medications, her numbers are still low.  She still takes Trulicity  once a week, lantus  50 units but takes less of blood sugar is low before bed. Call patient at [phone]. Copy of letter in provider box.

## 2024-01-29 NOTE — Patient Instructions (Signed)
 Goals  Go by Dr. Kit office to show them your Dexcom readings and adjustments in lowering your insulin  to prevent hypoglycemia. Call me back to let me know what she changed. Keep up the great job Eat three meals per day  Don't forget to take insulin  or medications as prescribed. Keep up the great job!!

## 2024-01-29 NOTE — Progress Notes (Signed)
 Medical Nutrition Therapy  Appointment Start time:  1000 Appointment End time:  1030  Primary concerns today: Obesity and DM Type 2, CKD Referral diagnosis: E11.8, E66.9, N18.3 Preferred learning style: No preference.  Learning readiness: Change in progress    NUTRITION ASSESSMENT   Dm Follow up  I'm having so many low blood sugars. Dexcom:68% TIR, 7% Hypoglycemia. 16% high and 9% very high.  GMI 6.9%. down from 7.7%. Will get new A1C at next MD visit in November she thinks. Has cut out sodas and eating healthier. Eating 3 meals per day. Lantus  50 units a day but forgot it last night due to being so tired and went to bed. Cut ou a lot of processed foods.  Truclicity daily and Glipizide  10 mg in am. She had stopped night time Glipizide  per my recommendatiosn due to hypoglycemia last visit. Sometimes only takes 30 units at night of Lantus  depending on what her blood sugars are. Advised to take her Dexcom readings to Dr. Antonetta office for medication adjustments. Got her B12 infusion.  Eating better balanced meals and cut out sodas.   Goals set previously Reduce to 1 coke zero a day and 4 bottles of water  per day-done Cut out chinese and processed foods of hot dogs and focus on more fruits, vegetables and lean chicken, fish or turkey.-done Walk for 30 minutes a day-still work in progress. Get A1C down to 7% Stop Glipizide  10 mg at night to see if it prevents low blood sugar in am. Keep the 10 mg of Glipizide  in the morning. done Start the Trulicity  when you get it- has started. Pick up new sensors. done   Wt Readings from Last 3 Encounters:  12/31/23 224 lb 9.6 oz (101.9 kg)  12/25/23 225 lb (102.1 kg)  12/18/23 232 lb (105.2 kg)   Ht Readings from Last 3 Encounters:  12/31/23 5' 6 (1.676 m)  12/25/23 5' 6 (1.676 m)  12/18/23 5' 6 (1.676 m)   There is no height or weight on file to calculate BMI. @BMIFA @ Facility age limit for growth %iles is 20 years. Facility age limit  for growth %iles is 20 years.  Lab Results  Component Value Date   HGBA1C 7.7 (H) 12/15/2023    Anthropometrics  Wt Readings from Last 3 Encounters:  12/31/23 224 lb 9.6 oz (101.9 kg)  12/25/23 225 lb (102.1 kg)  12/18/23 232 lb (105.2 kg)   Ht Readings from Last 3 Encounters:  12/31/23 5' 6 (1.676 m)  12/25/23 5' 6 (1.676 m)  12/18/23 5' 6 (1.676 m)   There is no height or weight on file to calculate BMI. @BMIFA @ Facility age limit for growth %iles is 20 years. Facility age limit for growth %iles is 20 years.    Clinical Medical Hx: See chart Medications: Lantus  50 units at night, Glipizide  Labs:  Lab Results  Component Value Date   HGBA1C 7.7 (H) 12/15/2023       Latest Ref Rng & Units 12/15/2023    8:37 AM 12/11/2023    1:44 PM 09/23/2023    2:20 PM  CMP  Glucose 70 - 99 mg/dL 69  859  883   BUN 8 - 27 mg/dL 31  22  25    Creatinine 0.57 - 1.00 mg/dL 8.45  8.43  8.57   Sodium 134 - 144 mmol/L 142  133  141   Potassium 3.5 - 5.2 mmol/L 4.6  3.7  4.2   Chloride 96 - 106 mmol/L 105  101  103   CO2 20 - 29 mmol/L 21  23  21    Calcium  8.7 - 10.3 mg/dL 8.9  8.4  9.3   Total Protein 6.0 - 8.5 g/dL 6.6  7.1    Total Bilirubin 0.0 - 1.2 mg/dL 0.2  0.5    Alkaline Phos 44 - 121 IU/L 107  93    AST 0 - 40 IU/L 26  26    ALT 0 - 32 IU/L 24  20     Lipid Panel     Component Value Date/Time   CHOL 106 12/15/2023 0837   TRIG 63 12/15/2023 0837   HDL 48 12/15/2023 0837   CHOLHDL 2.2 12/15/2023 0837   CHOLHDL 3.3 03/30/2019 0914   VLDL 29 11/04/2016 1033   LDLCALC 44 12/15/2023 0837   LDLCALC 106 (H) 03/30/2019 0914   LABVLDL 14 12/15/2023 0837    Notable Signs/Symptoms: Tired, craves sweets  Lifestyle & Dietary Hx Lives by herself. Cooks at home and eats out.  Estimated daily fluid intake: 30 oz Supplements:  Sleep: 6-7  Stress / self-care:  Current average weekly physical activity: ADL  24-Hr Dietary Recall B) banana, boiled egg L) CHinese -beef and  mushrooms, rice, Coke zero D) 2 hot dogs with bread 2 slices bread and mayonnaise, Coke zero  Estimated Energy Needs Calories: 1400 Carbohydrate: 158g Protein: 105g Fat: 39g   NUTRITION DIAGNOSIS  NB-1.1 Food and nutrition-related knowledge deficit As related to Diabetes Type 2.  As evidenced by A1C 10.2%.   NUTRITION INTERVENTION  Nutrition education (E-1) on the following topics:  Nutrition and Diabetes education provided on My Plate, CHO counting, meal planning, portion sizes, timing of meals, avoiding snacks between meals unless having a low blood sugar, target ranges for A1C and blood sugars, signs/symptoms and treatment of hyper/hypoglycemia, monitoring blood sugars, taking medications as prescribed, benefits of exercising 30 minutes per day and prevention of complications of DM.  Lifestyle Medicine  - Whole Food, Plant Predominant Nutrition is highly recommended: Eat Plenty of vegetables, Mushrooms, fruits, Legumes, Whole Grains, Nuts, seeds in lieu of processed meats, processed snacks/pastries red meat, poultry, eggs.    -It is better to avoid simple carbohydrates including: Cakes, Sweet Desserts, Ice Cream, Soda (diet and regular), Sweet Tea, Candies, Chips, Cookies, Store Bought Juices, Alcohol in Excess of  1-2 drinks a day, Lemonade,  Artificial Sweeteners, Doughnuts, Coffee Creamers, Sugar-free Products, etc, etc.  This is not a complete list.....  Exercise: If you are able: 30 -60 minutes a day ,4 days a week, or 150 minutes a week.  The longer the better.  Combine stretch, strength, and aerobic activities.  If you were told in the past that you have high risk for cardiovascular diseases, you may seek evaluation by your heart doctor prior to initiating moderate to intense exercise programs.    Handouts Provided Include  Lifestyle Medicine Dexcom insturctions  Learning Style & Readiness for Change Teaching method utilized: Visual & Auditory  Demonstrated degree of  understanding via: Teach Back  Barriers to learning/adherence to lifestyle change: None  Goals Established by Pt   Go by Dr. Kit office to show them your Dexcom readings and adjustments in lowering your insulin  to prevent hypoglycemia. Call me back to let me know what she changed. Keep up the great job Eat three meals per day  Don't forget to take insulin  or medications as prescribed. Keep up the great job!!  MONITORING & EVALUATION Dietary intake, weekly physical activity, and  weight and BS in 3 month.  Next Steps  Patient is to work on eating more plant based foods and cut out chips and sodas.Helen Mahoney

## 2024-01-30 NOTE — Telephone Encounter (Signed)
Pt informed

## 2024-02-02 ENCOUNTER — Telehealth: Payer: Self-pay | Admitting: Nutrition

## 2024-02-02 ENCOUNTER — Other Ambulatory Visit: Payer: Self-pay | Admitting: Family Medicine

## 2024-02-02 NOTE — Telephone Encounter (Signed)
 Pt called to say she is still having low blood sugars below 70 mg/dl. Dr Simpson's office didn't make any further changes with her medications other than stopping her night time glipizide  as I had requested.(Dr. Antonetta is out of town.)  Advised pt to REDUCE her insulin  dose to 35 units nightly due to chronic hypoglycemia, down from 50 units a day. She verbalized understanding. She will call me next week to update on current blood sugars after the reduction of insulin  to 35 units at night. She verbalized understanding.

## 2024-02-16 ENCOUNTER — Encounter: Payer: Self-pay | Admitting: Nutrition

## 2024-03-03 ENCOUNTER — Other Ambulatory Visit: Payer: Self-pay | Admitting: Family Medicine

## 2024-03-08 ENCOUNTER — Ambulatory Visit (HOSPITAL_COMMUNITY)
Admission: RE | Admit: 2024-03-08 | Discharge: 2024-03-08 | Disposition: A | Discharge status: Home / Self Care | Source: Ambulatory Visit | Attending: Family Medicine | Admitting: Family Medicine

## 2024-03-08 ENCOUNTER — Other Ambulatory Visit: Payer: Self-pay | Admitting: Family Medicine

## 2024-03-08 DIAGNOSIS — Z1231 Encounter for screening mammogram for malignant neoplasm of breast: Secondary | ICD-10-CM | POA: Insufficient documentation

## 2024-03-09 ENCOUNTER — Telehealth: Payer: Self-pay

## 2024-03-09 NOTE — Telephone Encounter (Signed)
 Copied from CRM [id number]. Topic: General - Other >> Mar 08, 2024 12:50 PM Harlene ORN wrote: Reason for CRM: Patient called requesting a doctor's note excusing her for Jury Duty for 12/01st. Please call the patient when ready for pick up.

## 2024-03-09 NOTE — Telephone Encounter (Signed)
 Completed, placed up front, pt informed

## 2024-03-22 ENCOUNTER — Ambulatory Visit (INDEPENDENT_AMBULATORY_CARE_PROVIDER_SITE_OTHER)

## 2024-03-22 DIAGNOSIS — Z23 Encounter for immunization: Secondary | ICD-10-CM

## 2024-03-22 NOTE — Progress Notes (Signed)
 Patient is in office today for a nurse visit for flu shot. Patient Injection was given in the  Left arm. Patient tolerated injection well.

## 2024-03-29 DIAGNOSIS — N189 Chronic kidney disease, unspecified: Secondary | ICD-10-CM | POA: Diagnosis not present

## 2024-03-29 DIAGNOSIS — E211 Secondary hyperparathyroidism, not elsewhere classified: Secondary | ICD-10-CM | POA: Diagnosis not present

## 2024-03-29 DIAGNOSIS — R809 Proteinuria, unspecified: Secondary | ICD-10-CM | POA: Diagnosis not present

## 2024-03-29 DIAGNOSIS — E119 Type 2 diabetes mellitus without complications: Secondary | ICD-10-CM | POA: Diagnosis not present

## 2024-03-29 DIAGNOSIS — D631 Anemia in chronic kidney disease: Secondary | ICD-10-CM | POA: Diagnosis not present

## 2024-03-29 DIAGNOSIS — I10 Essential (primary) hypertension: Secondary | ICD-10-CM | POA: Diagnosis not present

## 2024-04-14 LAB — OPHTHALMOLOGY REPORT-SCANNED

## 2024-04-15 ENCOUNTER — Other Ambulatory Visit: Payer: Self-pay

## 2024-04-15 ENCOUNTER — Telehealth: Payer: Self-pay | Admitting: Family Medicine

## 2024-04-15 ENCOUNTER — Telehealth: Payer: Self-pay

## 2024-04-15 MED ORDER — DROPLET PEN NEEDLES 31G X 8 MM MISC: 1.0000 | Freq: Every day | 3 refills | Status: AC

## 2024-04-15 NOTE — Telephone Encounter (Unsigned)
 Copied from CRM #[id number]. Topic: Clinical - Medication Refill >> Apr 15, 2024  9:41 AM Harlene ORN wrote: Medication: DROPLET PEN NEEDLES 31G X 8 MM MISC  Has the patient contacted their pharmacy? Yes (Agent: If no, request that the patient contact the pharmacy for the refill. If patient does not wish to contact the pharmacy document the reason why and proceed with request.) (Agent: If yes, when and what did the pharmacy advise?)  This is the patient's preferred pharmacy:  [facility] [address] [place] - [address] [place] [address] Phone: [phone] Fax: [phone]  Is this the correct pharmacy for this prescription? Yes If no, delete pharmacy and type the correct one.   Has the prescription been filled recently? Yes  Is the patient out of the medication? Yes  Has the patient been seen for an appointment in the last year OR does the patient have an upcoming appointment? Yes  Can we respond through MyChart? No  Agent: Please be advised that Rx refills may take up to 3 business days. We ask that you follow-up with your pharmacy.

## 2024-04-15 NOTE — Telephone Encounter (Signed)
 Copied from CRM #[id number]. Topic: MyChart - Password Reset >> Apr 15, 2024  9:50 AM Harlene ORN wrote: Password needs password reset phone number to be changed to  [phone] please. Thank you!

## 2024-04-15 NOTE — Telephone Encounter (Signed)
 Refilled

## 2024-04-20 ENCOUNTER — Inpatient Hospital Stay: Attending: Oncology

## 2024-04-20 DIAGNOSIS — D563 Thalassemia minor: Secondary | ICD-10-CM | POA: Insufficient documentation

## 2024-04-20 DIAGNOSIS — R5383 Other fatigue: Secondary | ICD-10-CM | POA: Insufficient documentation

## 2024-04-20 DIAGNOSIS — Z79899 Other long term (current) drug therapy: Secondary | ICD-10-CM | POA: Diagnosis not present

## 2024-04-20 DIAGNOSIS — R778 Other specified abnormalities of plasma proteins: Secondary | ICD-10-CM

## 2024-04-20 DIAGNOSIS — D508 Other iron deficiency anemias: Secondary | ICD-10-CM | POA: Diagnosis present

## 2024-04-20 DIAGNOSIS — E538 Deficiency of other specified B group vitamins: Secondary | ICD-10-CM | POA: Diagnosis not present

## 2024-04-20 DIAGNOSIS — D472 Monoclonal gammopathy: Secondary | ICD-10-CM | POA: Diagnosis not present

## 2024-04-20 DIAGNOSIS — R7689 Other specified abnormal immunological findings in serum: Secondary | ICD-10-CM | POA: Diagnosis not present

## 2024-04-20 DIAGNOSIS — N189 Chronic kidney disease, unspecified: Secondary | ICD-10-CM | POA: Diagnosis not present

## 2024-04-20 LAB — COMPREHENSIVE METABOLIC PANEL WITH GFR
ALT: 24 U/L (ref 0–44)
AST: 27 U/L (ref 15–41)
Albumin: 4 g/dL (ref 3.5–5.0)
Alkaline Phosphatase: 115 U/L (ref 38–126)
Anion gap: 13 (ref 5–15)
BUN: 28 mg/dL — ABNORMAL HIGH (ref 8–23)
CO2: 23 mmol/L (ref 22–32)
Calcium: 9 mg/dL (ref 8.9–10.3)
Chloride: 99 mmol/L (ref 98–111)
Creatinine, Ser: 1.33 mg/dL — ABNORMAL HIGH (ref 0.44–1.00)
GFR, Estimated: 42 mL/min — ABNORMAL LOW
Glucose, Bld: 176 mg/dL — ABNORMAL HIGH (ref 70–99)
Potassium: 4.3 mmol/L (ref 3.5–5.1)
Sodium: 135 mmol/L (ref 135–145)
Total Bilirubin: 0.5 mg/dL (ref 0.0–1.2)
Total Protein: 7.4 g/dL (ref 6.5–8.1)

## 2024-04-20 LAB — IRON AND TIBC
Iron: 50 ug/dL (ref 28–170)
Saturation Ratios: 17 % (ref 10.4–31.8)
TIBC: 295 ug/dL (ref 250–450)
UIBC: 246 ug/dL

## 2024-04-20 LAB — VITAMIN D 25 HYDROXY (VIT D DEFICIENCY, FRACTURES): Vit D, 25-Hydroxy: 43.1 ng/mL (ref 30–100)

## 2024-04-20 LAB — VITAMIN B12: Vitamin B-12: 1367 pg/mL — ABNORMAL HIGH (ref 180–914)

## 2024-04-20 LAB — CBC WITH DIFFERENTIAL/PLATELET
Abs Immature Granulocytes: 0.04 K/uL (ref 0.00–0.07)
Basophils Absolute: 0.1 K/uL (ref 0.0–0.1)
Basophils Relative: 1 %
Eosinophils Absolute: 0.2 K/uL (ref 0.0–0.5)
Eosinophils Relative: 4 %
HCT: 34.4 % — ABNORMAL LOW (ref 36.0–46.0)
Hemoglobin: 10.4 g/dL — ABNORMAL LOW (ref 12.0–15.0)
Immature Granulocytes: 1 %
Lymphocytes Relative: 23 %
Lymphs Abs: 1.6 K/uL (ref 0.7–4.0)
MCH: 22 pg — ABNORMAL LOW (ref 26.0–34.0)
MCHC: 30.2 g/dL (ref 30.0–36.0)
MCV: 72.9 fL — ABNORMAL LOW (ref 80.0–100.0)
Monocytes Absolute: 0.6 K/uL (ref 0.1–1.0)
Monocytes Relative: 9 %
Neutro Abs: 4.2 K/uL (ref 1.7–7.7)
Neutrophils Relative %: 62 %
Platelets: 296 K/uL (ref 150–400)
RBC: 4.72 MIL/uL (ref 3.87–5.11)
RDW: 15.9 % — ABNORMAL HIGH (ref 11.5–15.5)
WBC: 6.8 K/uL (ref 4.0–10.5)
nRBC: 0 % (ref 0.0–0.2)

## 2024-04-20 LAB — FOLATE: Folate: 18.1 ng/mL

## 2024-04-20 LAB — LACTATE DEHYDROGENASE: LDH: 204 U/L (ref 105–235)

## 2024-04-20 LAB — FERRITIN: Ferritin: 607 ng/mL — ABNORMAL HIGH (ref 11–307)

## 2024-04-24 LAB — METHYLMALONIC ACID, SERUM: Methylmalonic Acid, Quantitative: 177 nmol/L (ref 0–378)

## 2024-04-26 ENCOUNTER — Encounter: Payer: Self-pay | Admitting: *Deleted

## 2024-04-27 ENCOUNTER — Inpatient Hospital Stay: Admitting: Oncology

## 2024-04-27 VITALS — BP 112/52 | HR 91 | Temp 97.9°F | Resp 18 | Ht 66.0 in | Wt 210.0 lb

## 2024-04-27 DIAGNOSIS — E538 Deficiency of other specified B group vitamins: Secondary | ICD-10-CM

## 2024-04-27 DIAGNOSIS — D563 Thalassemia minor: Secondary | ICD-10-CM | POA: Diagnosis not present

## 2024-04-27 DIAGNOSIS — D508 Other iron deficiency anemias: Secondary | ICD-10-CM | POA: Diagnosis not present

## 2024-04-27 DIAGNOSIS — R778 Other specified abnormalities of plasma proteins: Secondary | ICD-10-CM | POA: Diagnosis not present

## 2024-04-27 NOTE — Progress Notes (Signed)
 "  [facility] OFFICE PROGRESS NOTE  Antonetta Rollene BRAVO, MD  ASSESSMENT & PLAN:    Assessment & Plan Other iron  deficiency anemia - Iron  labs have improved from previous. -She received 2 doses of IV Feraheme  on 01/02/2024 and 01/09/2024. -She has not required IV iron  since 2017. - No additional IV iron  needed at this time. -She denies any melena, hematochezia or bright red blood per rectum. -Continue iron  tablets every other day with vitamin C. 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  Alpha thalassemia genotype study done on 08/17/2015. -Baseline hemoglobin ranges from 9.5-11. -Hemoglobin is stable at 10.4 today. -She does not need any intervention at this time. B12 deficiency -Has received B12 injections and she is currently taking B12 supplements 1000 mcg daily. -Most recent labs show elevated B12 levels with a normal MMA. -Recommend decreasing B12 supplements to every other day. -Recheck these levels in 3 to 4 months.   Abnormal SPEP -She is followed by Dr. Rachele for CKD. -Incidentally found to have elevated kappa and lambda light chains along with kappa lambda light chain ratio.  Urine immunofixation showed polyclonal light chains negative for monoclonal immunoglobulin or free light chain. -Hematology workup from 05/23/2022 showed SPEP negative, immunofixation shows polyclonal increase in immunoglobulins.  Mildly elevated beta-2  globulin in the setting of CKD LDH was normal. -She continues to deny any B symptoms. -Protein electrophoresis from 06/13/2023 was negative for M spike and immunofixation pattern appears unremarkable.  Evidence of monoclonal protein is not apparent.  Beta-2  microglobulin slightly elevated at 3.4 in the setting of CKD.  LDH is normal at 172.  Kappa lambda light chain ratio elevated at 1.98 with improvement from previous.  Both kappa free and lambda free light chains are elevated but significantly improved  from previous. -Recommend we continue to monitor with labs every 12 months. -Recheck these labs at next visit.  Orders Placed This Encounter  Procedures   Ferritin    Standing Status:   Future    Expected Date:   08/26/2024    Expiration Date:   11/24/2024   Iron  and TIBC ([facility] ONLY)    Standing Status:   Future    Expected Date:   08/26/2024    Expiration Date:   11/24/2024   Vitamin B12    Standing Status:   Future    Expected Date:   08/26/2024    Expiration Date:   11/24/2024   Protein electrophoresis, serum    Standing Status:   Future    Expected Date:   08/26/2024    Expiration Date:   11/24/2024   Immunofixation electrophoresis    Standing Status:   Future    Expected Date:   08/26/2024    Expiration Date:   11/24/2024   Folate    Standing Status:   Future    Expected Date:   08/26/2024    Expiration Date:   11/24/2024   CBC with Differential    Standing Status:   Future    Expected Date:   08/26/2024    Expiration Date:   11/24/2024   Comprehensive metabolic panel    Standing Status:   Future    Expected Date:   08/26/2024    Expiration Date:   11/24/2024   Methylmalonic acid, serum    Standing Status:   Future    Expected Date:   08/26/2024    Expiration Date:   11/24/2024   Vitamin D  25 hydroxy    Standing  Status:   Future    Expected Date:   08/26/2024    Expiration Date:   11/24/2024   Kappa/lambda light chains    Standing Status:   Future    Expected Date:   08/26/2024    Expiration Date:   11/24/2024   Lactate dehydrogenase    Standing Status:   Future    Expected Date:   08/26/2024    Expiration Date:   11/24/2024   Beta 2 microglobulin, serum    Standing Status:   Future    Expected Date:   08/26/2024    Expiration Date:   11/24/2024    INTERVAL HISTORY: Patient returns for follow-up.  Patient is a 73 year old female who is followed by hematology for alpha thalassemia minor, iron  deficiency, B12 deficiency and MGUS.  She received recently  2 doses of IV Feraheme  on 01/02/2024 and 01/09/2024.  She also received 2 B12 injections.  She denies any hospitalizations, surgeries or changes to her baseline health.   She is currently taking oral iron , B12 and vitamin D  daily.   She is followed closely by Dr. Rachele for her kidneys.   Reports overall feels well.  Denies any B symptoms.  Reports appetite and energy levels have improved since her iron  infusions.  She has intermittent fatigue but denies any pain.  We reviewed CMP, CBC, iron  panel, ferritin and vitamin D  level.  SUMMARY OF HEMATOLOGIC HISTORY: Oncology History   No problem history exists.     CBC    Component Value Date/Time   WBC 6.8 04/20/2024 1334   RBC 4.72 04/20/2024 1334   HGB 10.4 (L) 04/20/2024 1334   HGB 11.0 (L) 08/07/2021 1041   HCT 34.4 (L) 04/20/2024 1334   HCT 36.4 08/07/2021 1041   PLT 296 04/20/2024 1334   PLT 318 08/07/2021 1041   MCV 72.9 (L) 04/20/2024 1334   MCV 72 (L) 08/07/2021 1041   MCH 22.0 (L) 04/20/2024 1334   MCHC 30.2 04/20/2024 1334   RDW 15.9 (H) 04/20/2024 1334   RDW 15.9 (H) 08/07/2021 1041   LYMPHSABS 1.6 04/20/2024 1334   LYMPHSABS 2.4 08/14/2020 0914   MONOABS 0.6 04/20/2024 1334   EOSABS 0.2 04/20/2024 1334   EOSABS 0.4 08/14/2020 0914   BASOSABS 0.1 04/20/2024 1334   BASOSABS 0.1 08/14/2020 0914       Latest Ref Rng & Units 04/20/2024    1:34 PM 12/15/2023    8:37 AM 12/11/2023    1:44 PM  CMP  Glucose 70 - 99 mg/dL 823  69  859   BUN 8 - 23 mg/dL 28  31  22    Creatinine 0.44 - 1.00 mg/dL 8.66  8.45  8.43   Sodium 135 - 145 mmol/L 135  142  133   Potassium 3.5 - 5.1 mmol/L 4.3  4.6  3.7   Chloride 98 - 111 mmol/L 99  105  101   CO2 22 - 32 mmol/L 23  21  23    Calcium  8.9 - 10.3 mg/dL 9.0  8.9  8.4   Total Protein 6.5 - 8.1 g/dL 7.4  6.6  7.1   Total Bilirubin 0.0 - 1.2 mg/dL 0.5  0.2  0.5   Alkaline Phos 38 - 126 U/L 115  107  93   AST 15 - 41 U/L 27  26  26    ALT 0 - 44 U/L 24  24  20       Lab Results   Component Value Date  FERRITIN 607 (H) 04/20/2024   VITAMINB12 1,367 (H) 04/20/2024    Vitals:   04/27/24 1330  BP: (!) 112/52  Pulse: 91  Resp: 18  Temp: 97.9 F (36.6 C)  SpO2: 100%    Review of System:  Review of Systems  Constitutional:  Positive for malaise/fatigue.  All other systems reviewed and are negative.   Physical Exam: Physical Exam Constitutional:      Appearance: Normal appearance.  HENT:     Head: Normocephalic and atraumatic.  Eyes:     Pupils: Pupils are equal, round, and reactive to light.  Cardiovascular:     Rate and Rhythm: Normal rate and regular rhythm.     Heart sounds: Normal heart sounds. No murmur heard. Pulmonary:     Effort: Pulmonary effort is normal.     Breath sounds: Normal breath sounds. No wheezing.  Abdominal:     General: Bowel sounds are normal. There is no distension.     Palpations: Abdomen is soft.     Tenderness: There is no abdominal tenderness.  Musculoskeletal:        General: Normal range of motion.     Cervical back: Normal range of motion.  Skin:    General: Skin is warm and dry.     Findings: No rash.  Neurological:     Mental Status: She is alert and oriented to person, place, and time.     Gait: Gait is intact.  Psychiatric:        Mood and Affect: Mood and affect normal.        Cognition and Memory: Memory normal.        Judgment: Judgment normal.      I spent 25 minutes dedicated to the care of this patient (face-to-face and non-face-to-face) on the date of the encounter to include what is described in the assessment and plan.,  Delon Hope, NP 04/27/2024 2:11 PM "

## 2024-04-27 NOTE — Assessment & Plan Note (Addendum)
-  Has received B12 injections and she is currently taking B12 supplements 1000 mcg daily. -Most recent labs show elevated B12 levels with a normal MMA. -Recommend decreasing B12 supplements to every other day. -Recheck these levels in 3 to 4 months.

## 2024-04-27 NOTE — Assessment & Plan Note (Addendum)
-   Iron  labs have improved from previous. -She received 2 doses of IV Feraheme  on 01/02/2024 and 01/09/2024. -She has not required IV iron  since 2017. - No additional IV iron  needed at this time. -She denies any melena, hematochezia or bright red blood per rectum. -Continue iron  tablets every other day with vitamin C.

## 2024-04-27 NOTE — Assessment & Plan Note (Addendum)
-  She is followed by Dr. Rachele for CKD. -Incidentally found to have elevated kappa and lambda light chains along with kappa lambda light chain ratio.  Urine immunofixation showed polyclonal light chains negative for monoclonal immunoglobulin or free light chain. -Hematology workup from 05/23/2022 showed SPEP negative, immunofixation shows polyclonal increase in immunoglobulins.  Mildly elevated beta-2  globulin in the setting of CKD LDH was normal. -She continues to deny any B symptoms. -Protein electrophoresis from 06/13/2023 was negative for M spike and immunofixation pattern appears unremarkable.  Evidence of monoclonal protein is not apparent.  Beta-2  microglobulin slightly elevated at 3.4 in the setting of CKD.  LDH is normal at 172.  Kappa lambda light chain ratio elevated at 1.98 with improvement from previous.  Both kappa free and lambda free light chains are elevated but significantly improved from previous. -Recommend we continue to monitor with labs every 12 months. -Recheck these labs at next visit.

## 2024-04-27 NOTE — Assessment & Plan Note (Addendum)
-  Longstanding history of microcytic anemia.  Alpha thalassemia genotype study done on 08/17/2015. -Baseline hemoglobin ranges from 9.5-11. -Hemoglobin is stable at 10.4 today. -She does not need any intervention at this time.

## 2024-05-04 ENCOUNTER — Encounter: Payer: Self-pay | Admitting: Nutrition

## 2024-05-04 ENCOUNTER — Encounter: Attending: Family Medicine | Admitting: Nutrition

## 2024-05-04 VITALS — Ht 66.0 in | Wt 218.0 lb

## 2024-05-04 DIAGNOSIS — N183 Chronic kidney disease, stage 3 unspecified: Secondary | ICD-10-CM | POA: Diagnosis present

## 2024-05-04 DIAGNOSIS — Z794 Long term (current) use of insulin: Secondary | ICD-10-CM | POA: Insufficient documentation

## 2024-05-04 DIAGNOSIS — E1122 Type 2 diabetes mellitus with diabetic chronic kidney disease: Secondary | ICD-10-CM | POA: Diagnosis present

## 2024-05-04 NOTE — Patient Instructions (Signed)
 Goals  Re start taking Trulicity  once a wee, Do not fast anymore Eat three meals per day B) 8 am L) 12-2 D) 5-6 pm Drink only water  Cut out sodas Take 50 units of Lantus  at night consistently. Prevent low blood sugars. Call MD if BS are below 70 for 3 days in the morning.

## 2024-05-04 NOTE — Progress Notes (Signed)
 Medical Nutrition Therapy  Appointment Start time:  1050  Appointment End time: 1130  Primary concerns today: Obesity and DM Type 2, CKD Referral diagnosis: E11.8, E66.9, N18.3 Preferred learning style: No preference.  Learning readiness: Change in progress    NUTRITION ASSESSMENT   Dm Follow up  My blood sugars have been going high at night and too low in the morning. I stopped taking the Trulicity . I take about 50 units of Lantus  at night. I cut out drinking sodas as of yesterday.   Taking 1 10 mg of Glipizide  daily now. Will see PCP in a few weeks. Dexcom reaveals GMI 7.7%, Avg Gluc 184 mg/dl. TIR 48%, 30% high, 18% very high. Low 4%, very low 1%.  Has been trying to fast and eating only from 11 am til 6 pm. Diet is insuffient to meet her nutritional needs. She is willing to resume her Trulicity . Would benefit from seeing an Endocrinologist. Gained 8 lbs.   Wt Readings from Last 3 Encounters:  05/04/24 218 lb (98.9 kg)  04/27/24 210 lb (95.3 kg)  01/29/24 223 lb 6.4 oz (101.3 kg)   Ht Readings from Last 3 Encounters:  05/04/24 5' 6 (1.676 m)  04/27/24 5' 6 (1.676 m)  01/29/24 5' 6 (1.676 m)   Body mass index is 35.19 kg/m. @BMIFA @ Facility age limit for growth %iles is 20 years. Facility age limit for growth %iles is 20 years.   Clinical Medical Hx: See chart Medications: Lantus  50 units at night, Glipizide  Labs:  Lab Results  Component Value Date   HGBA1C 7.7 (H) 12/15/2023       Latest Ref Rng & Units 04/20/2024    1:34 PM 12/15/2023    8:37 AM 12/11/2023    1:44 PM  CMP  Glucose 70 - 99 mg/dL 823  69  859   BUN 8 - 23 mg/dL 28  31  22    Creatinine 0.44 - 1.00 mg/dL 8.66  8.45  8.43   Sodium 135 - 145 mmol/L 135  142  133   Potassium 3.5 - 5.1 mmol/L 4.3  4.6  3.7   Chloride 98 - 111 mmol/L 99  105  101   CO2 22 - 32 mmol/L 23  21  23    Calcium  8.9 - 10.3 mg/dL 9.0  8.9  8.4   Total Protein 6.5 - 8.1 g/dL 7.4  6.6  7.1   Total Bilirubin 0.0 - 1.2  mg/dL 0.5  0.2  0.5   Alkaline Phos 38 - 126 U/L 115  107  93   AST 15 - 41 U/L 27  26  26    ALT 0 - 44 U/L 24  24  20     Lipid Panel     Component Value Date/Time   CHOL 106 12/15/2023 0837   TRIG 63 12/15/2023 0837   HDL 48 12/15/2023 0837   CHOLHDL 2.2 12/15/2023 0837   CHOLHDL 3.3 03/30/2019 0914   VLDL 29 11/04/2016 1033   LDLCALC 44 12/15/2023 0837   LDLCALC 106 (H) 03/30/2019 0914   LABVLDL 14 12/15/2023 0837    Notable Signs/Symptoms: Tired, craves sweets  Lifestyle & Dietary Hx Lives by herself. Cooks at home and eats out.  Estimated daily fluid intake: 30 oz Supplements:  Sleep: 6-7  Stress / self-care:  Current average weekly physical activity: ADL  24-Hr Dietary Recall Fasting from 6 pm to 11 am.   11 am 2 boiled eggs without yolk, water   530 pm Ribs with  pasta 1/2 c, water    Estimated Energy Needs Calories: 1400 Carbohydrate: 158g Protein: 105g Fat: 39g   NUTRITION DIAGNOSIS  NB-1.1 Food and nutrition-related knowledge deficit As related to Diabetes Type 2.  As evidenced by A1C 10.2%.   NUTRITION INTERVENTION  Nutrition education (E-1) on the following topics:  Nutrition and Diabetes education provided on My Plate, CHO counting, meal planning, portion sizes, timing of meals, avoiding snacks between meals unless having a low blood sugar, target ranges for A1C and blood sugars, signs/symptoms and treatment of hyper/hypoglycemia, monitoring blood sugars, taking medications as prescribed, benefits of exercising 30 minutes per day and prevention of complications of DM.  Lifestyle Medicine  - Whole Food, Plant Predominant Nutrition is highly recommended: Eat Plenty of vegetables, Mushrooms, fruits, Legumes, Whole Grains, Nuts, seeds in lieu of processed meats, processed snacks/pastries red meat, poultry, eggs.    -It is better to avoid simple carbohydrates including: Cakes, Sweet Desserts, Ice Cream, Soda (diet and regular), Sweet Tea, Candies, Chips,  Cookies, Store Bought Juices, Alcohol in Excess of  1-2 drinks a day, Lemonade,  Artificial Sweeteners, Doughnuts, Coffee Creamers, Sugar-free Products, etc, etc.  This is not a complete list.....  Exercise: If you are able: 30 -60 minutes a day ,4 days a week, or 150 minutes a week.  The longer the better.  Combine stretch, strength, and aerobic activities.  If you were told in the past that you have high risk for cardiovascular diseases, you may seek evaluation by your heart doctor prior to initiating moderate to intense exercise programs.    Handouts Provided Include  Lifestyle Medicine Dexcom insturctions  Learning Style & Readiness for Change Teaching method utilized: Visual & Auditory  Demonstrated degree of understanding via: Teach Back  Barriers to learning/adherence to lifestyle change: None  Goals Established by Pt Goals  Re start taking Trulicity  once a wee, Do not fast anymore Eat three meals per day B) 8 am L) 12-2 D) 5-6 pm Drink only water  Cut out sodas Take 50 units of Lantus  at night consistently. Prevent low blood sugars. Call MD if BS are below 70 for 3 days in the morning.   MONITORING & EVALUATION Dietary intake, weekly physical activity, and weight and BS in 3 month.  Next Steps  Patient is to work on eating more plant based foods and cut out chips and sodas.SABRA

## 2024-05-14 ENCOUNTER — Other Ambulatory Visit: Payer: Self-pay | Admitting: Family Medicine

## 2024-05-14 ENCOUNTER — Ambulatory Visit: Payer: Self-pay | Admitting: Family Medicine

## 2024-05-14 LAB — BMP8+EGFR
BUN/Creatinine Ratio: 15 (ref 12–28)
BUN: 22 mg/dL (ref 8–27)
CO2: 22 mmol/L (ref 20–29)
Calcium: 8.9 mg/dL (ref 8.7–10.3)
Chloride: 101 mmol/L (ref 96–106)
Creatinine, Ser: 1.44 mg/dL — ABNORMAL HIGH (ref 0.57–1.00)
Glucose: 134 mg/dL — ABNORMAL HIGH (ref 70–99)
Potassium: 4.3 mmol/L (ref 3.5–5.2)
Sodium: 140 mmol/L (ref 134–144)
eGFR: 38 mL/min/1.73 — ABNORMAL LOW

## 2024-05-14 LAB — HEMOGLOBIN A1C
Est. average glucose Bld gHb Est-mCnc: 166 mg/dL
Hgb A1c MFr Bld: 7.4 % — ABNORMAL HIGH (ref 4.8–5.6)

## 2024-05-15 ENCOUNTER — Other Ambulatory Visit: Payer: Self-pay | Admitting: Family Medicine

## 2024-05-21 ENCOUNTER — Telehealth: Payer: Self-pay | Admitting: Family Medicine

## 2024-05-21 ENCOUNTER — Encounter: Payer: Self-pay | Admitting: Family Medicine

## 2024-05-21 ENCOUNTER — Ambulatory Visit: Admitting: Family Medicine

## 2024-05-21 ENCOUNTER — Ambulatory Visit (INDEPENDENT_AMBULATORY_CARE_PROVIDER_SITE_OTHER)

## 2024-05-21 VITALS — BP 119/74 | HR 85 | Ht 66.0 in | Wt 217.6 lb

## 2024-05-21 VITALS — BP 119/74 | HR 85 | Resp 12 | Ht 66.0 in | Wt 217.0 lb

## 2024-05-21 DIAGNOSIS — L819 Disorder of pigmentation, unspecified: Secondary | ICD-10-CM

## 2024-05-21 DIAGNOSIS — Z0001 Encounter for general adult medical examination with abnormal findings: Secondary | ICD-10-CM

## 2024-05-21 DIAGNOSIS — N1832 Chronic kidney disease, stage 3b: Secondary | ICD-10-CM

## 2024-05-21 DIAGNOSIS — Z780 Asymptomatic menopausal state: Secondary | ICD-10-CM | POA: Diagnosis not present

## 2024-05-21 DIAGNOSIS — E785 Hyperlipidemia, unspecified: Secondary | ICD-10-CM

## 2024-05-21 DIAGNOSIS — Z9189 Other specified personal risk factors, not elsewhere classified: Secondary | ICD-10-CM | POA: Diagnosis not present

## 2024-05-21 DIAGNOSIS — Z794 Long term (current) use of insulin: Secondary | ICD-10-CM

## 2024-05-21 DIAGNOSIS — I10 Essential (primary) hypertension: Secondary | ICD-10-CM

## 2024-05-21 DIAGNOSIS — E0789 Other specified disorders of thyroid: Secondary | ICD-10-CM | POA: Diagnosis not present

## 2024-05-21 DIAGNOSIS — E559 Vitamin D deficiency, unspecified: Secondary | ICD-10-CM | POA: Diagnosis not present

## 2024-05-21 DIAGNOSIS — Z0000 Encounter for general adult medical examination without abnormal findings: Secondary | ICD-10-CM | POA: Diagnosis not present

## 2024-05-21 DIAGNOSIS — E1122 Type 2 diabetes mellitus with diabetic chronic kidney disease: Secondary | ICD-10-CM

## 2024-05-21 MED ORDER — GLIPIZIDE ER 10 MG PO TB24: 10.0000 mg | ORAL_TABLET | Freq: Every day | ORAL | 1 refills | Status: AC

## 2024-05-21 NOTE — Progress Notes (Signed)
 "  Bone Density Ordered Last diabetic eye exam requested Chief Complaint  Patient presents with   Medicare Wellness     Subjective:   Helen Mahoney is a 74 y.o. female who presents for a Medicare Annual Wellness Visit.  Visit info / Clinical Intake: Medicare Wellness Visit Type:: Subsequent Annual Wellness Visit Persons participating in visit and providing information:: patient Medicare Wellness Visit Mode:: In-person (required for WTM) Interpreter Needed?: No Pre-visit prep was completed: yes AWV questionnaire completed by patient prior to visit?: no Living arrangements:: (!) lives alone Patient's Overall Health Status Rating: very good Typical amount of pain: none Does pain affect daily life?: no Are you currently prescribed opioids?: no  Dietary Habits and Nutritional Risks How many meals a day?: 3 Eats fruit and vegetables daily?: yes Most meals are obtained by: preparing own meals In the last 2 weeks, have you had any of the following?: none Diabetic:: (!) yes Any non-healing wounds?: no How often do you check your BS?: continuous glucose monitor Would you like to be referred to a Nutritionist or for Diabetic Management? : no  Functional Status Activities of Daily Living (to include ambulation/medication): Independent Ambulation: Independent Home Management (perform basic housework or laundry): Independent Manage your own finances?: yes Primary transportation is: driving Concerns about vision?: no *vision screening is required for WTM* Concerns about hearing?: no  Fall Screening Falls in the past year?: 0 Number of falls in past year: 0 Was there an injury with Fall?: 0 Fall Risk Category Calculator: 0 Patient Fall Risk Level: Low Fall Risk  Fall Risk Patient at Risk for Falls Due to: No Fall Risks Fall risk Follow up: Falls evaluation completed; Education provided; Falls prevention discussed  Home and Transportation Safety: All rugs have non-skid  backing?: N/A, no rugs All stairs or steps have railings?: yes Grab bars in the bathtub or shower?: (!) no Have non-skid surface in bathtub or shower?: yes Good home lighting?: yes Regular seat belt use?: yes Hospital stays in the last year:: no  Cognitive Assessment Difficulty concentrating, remembering, or making decisions? : no Will 6CIT or Mini Cog be Completed: yes What year is it?: 0 points What month is it?: 0 points Give patient an address phrase to remember (5 components): [address] [place] About what time is it?: 0 points Count backwards from 20 to 1: 0 points Say the months of the year in reverse: 0 points Repeat the address phrase from earlier: 0 points 6 CIT Score: 0 points  Advance Directives (For Healthcare) Does Patient Have a Medical Advance Directive?: No Would patient like information on creating a medical advance directive?: Yes (MAU/Ambulatory/Procedural Areas - Information given)  Reviewed/Updated  Reviewed/Updated: Reviewed All (Medical, Surgical, Family, Medications, Allergies, Care Teams, Patient Goals)    Allergies (verified) Tetanus toxoid-containing vaccines and Penicillins   Current Medications (verified) Outpatient Encounter Medications as of 05/21/2024  Medication Sig   Alcohol Swabs (DROPSAFE ALCOHOL PREP) 70 % PADS USE AS DIRECTED THREE TIMES DAILY   aspirin 81 MG tablet Take 81 mg by mouth daily.   Blood Glucose Calibration (TRUE METRIX LEVEL 1) Low SOLN USE AS DIRECTED AS NEEDED   blood glucose meter kit and supplies Dispense based on patient and insurance preference. Once daily testing DX E11.9   Cholecalciferol (VITAMIN D3) 2000 units TABS Take 2,000 Units by mouth daily.   Continuous Blood Gluc Receiver (DEXCOM G7 RECEIVER) DEVI Use to monitor blood sugar daily.   Continuous Glucose Sensor (DEXCOM G7  SENSOR) MISC USE AS DIRECTED FOR  CONTINUOUS  GLUCOSE  MONITORING.  CHANGE  SENSOR  EVERY  10  DAYS   ezetimibe  (ZETIA ) 10 MG tablet  TAKE 1 TABLET EVERY DAY   ferrous sulfate 325 (65 FE) MG tablet Take 325 mg by mouth daily with breakfast. (Patient taking differently: Take 325 mg by mouth daily with breakfast. Taking it every other day instead daily.)   folic acid  (FOLVITE ) 800 MCG tablet Take 800 mcg by mouth daily.   glucose blood test strip Use as instructed   Insulin  Pen Needle (DROPLET PEN NEEDLES) 31G X 8 MM MISC Inject 1 each into the skin daily.   Lancets 30G MISC Once daily testing dx e11.9   LANTUS  SOLOSTAR 100 UNIT/ML Solostar Pen INJECT 50 UNITS SUBCUTANEOUSLY ONCE DAILY   lisinopril -hydrochlorothiazide  (ZESTORETIC ) 20-12.5 MG tablet TAKE 2 TABLETS ONE TIME DAILY FOR BLOOD PRESSURE (DOSE INCREASE)   rosuvastatin  (CRESTOR ) 40 MG tablet TAKE 1 TABLET EVERY DAY   TRUE METRIX BLOOD GLUCOSE TEST test strip TEST BLOOD SUGAR THREE TIMES DAILY   TRUEplus Lancets 33G MISC USE TO TEST BLOOD SUGAR THREE TIMES DAILY   Dulaglutide  (TRULICITY ) 0.75 MG/0.5ML SOAJ Inject 0.75 mg into the skin once a week. (Patient not taking: Reported on 05/21/2024)   glipiZIDE  (GLUCOTROL  XL) 10 MG 24 hr tablet Take 1 tablet (10 mg total) by mouth daily with breakfast.   No facility-administered encounter medications on file as of 05/21/2024.    History: Past Medical History:  Diagnosis Date   Agatston coronary artery calcium  score less than 100    51 on coronary CT 03/2021   Alpha-0- thalassemia trait/carrier 05/18/2015   2013: TCS/EGD 2017: TCS/EGD HYPERPLASTIC GASTRIC POLYPS, LYMPHOCYTIC GASTRITIS    B12 deficiency 06/16/2015   Colon polyps    Diabetes mellitus, type 2 (HCC)    Hyperlipidemia    Hypertension    Microcytic anemia 05/18/2015   2013: TCS/EGD 2017: TCS/EGD HYPERPLASTIC GASTRIC POLYPS, LYMPHOCYTIC GASTRITIS    Obesity    Past Surgical History:  Procedure Laterality Date   bilateral tubal ligation  1979   CHOLECYSTECTOMY  2001   ACUTE CHOLECYSTITIS/GALLSTONES   COLONOSCOPY  05/14/2011   Dr. Harvey: sessile polyp in  sigmoid colon, internal hemorrhoids, hyerplastic polyps   COLONOSCOPY N/A 05/15/2015   Procedure: COLONOSCOPY;  Surgeon: Margo LITTIE Harvey, MD;  Location: [facility];  Service: Endoscopy;  Laterality: N/A;  0830   COLONOSCOPY W/ POLYPECTOMY  NOV 2008 [facility] ANEMIA   POLYP NO RETRIEVED   COLONOSCOPY W/ POLYPECTOMY  2006 DR. SMTH   POLYP?   COLONOSCOPY WITH PROPOFOL  N/A 08/07/2020   Procedure: COLONOSCOPY WITH PROPOFOL ;  Surgeon: Cindie Carlin POUR, DO;  Location: [facility];  Service: Endoscopy;  Laterality: N/A;  ASA II / AM procedure   ESOPHAGOGASTRODUODENOSCOPY  05/14/2011   Dr. Harvey: sessile polyps in the cardia, mild gastritis. Chronic duodenitis consistent with peptic duodenitis, chronic active H.pylori gastritis.    ESOPHAGOGASTRODUODENOSCOPY N/A 05/15/2015   Procedure: ESOPHAGOGASTRODUODENOSCOPY (EGD);  Surgeon: Margo LITTIE Harvey, MD;  Location: [facility];  Service: Endoscopy;  Laterality: N/A;   GIVENS CAPSULE STUDY N/A 06/02/2015   Procedure: GIVENS CAPSULE STUDY;  Surgeon: Margo LITTIE Harvey, MD;  [facility]: [facility];  Service: Endoscopy;  Laterality: N/A;  0700   POLYPECTOMY  08/07/2020   Procedure: POLYPECTOMY;  Surgeon: Cindie Carlin POUR, DO;  Location: [facility];  Service: Endoscopy;;   TUBAL LIGATION     UPPER GASTROINTESTINAL ENDOSCOPY  NOV  2008 [facility] ANEMIA   NL EXAM, urease neg   Family History  Problem Relation Age of Onset   Heart disease Mother    Diabetes Mother    Hypertension Mother    Stroke Mother    Breast cancer Mother    Diabetes Father    Heart attack Father    Kidney disease Father    Hypertension Sister    Diabetes Sister    Diabetes Sister    Mental illness Sister    Hypertension Sister    Hepatitis C Sister    Mental illness Sister    Diabetes Sister    Hypertension Sister    Mental illness Sister    Colon cancer Sister 44   Diabetes Brother    Social History   Occupational History   Not on file  Tobacco Use   Smoking status: Never    Smokeless tobacco: Never  Vaping Use   Vaping status: Never Used  Substance and Sexual Activity   Alcohol use: No   Drug use: No   Sexual activity: Not Currently   Tobacco Counseling Counseling given: Yes  SDOH Screenings   Food Insecurity: No Food Insecurity (05/21/2024)  Housing: Low Risk (05/21/2024)  Transportation Needs: No Transportation Needs (05/21/2024)  Utilities: Not At Risk (05/21/2024)  Alcohol Screen: Low Risk (04/01/2023)  Depression (PHQ2-9): Low Risk (05/21/2024)  Financial Resource Strain: Low Risk (04/01/2023)  Physical Activity: Inactive (05/21/2024)  Social Connections: Moderately Integrated (05/21/2024)  Stress: No Stress Concern Present (05/21/2024)  Tobacco Use: Low Risk (05/21/2024)  Health Literacy: Adequate Health Literacy (05/21/2024)   See flowsheets for full screening details  Depression Screen PHQ 2 & 9 Depression Scale- Over the past 2 weeks, how often have you been bothered by any of the following problems? Little interest or pleasure in doing things: 0 Feeling down, depressed, or hopeless (PHQ Adolescent also includes...irritable): 0 PHQ-2 Total Score: 0 Trouble falling or staying asleep, or sleeping too much: 0 Feeling tired or having little energy: 0 Poor appetite or overeating (PHQ Adolescent also includes...weight loss): 0 Feeling bad about yourself - or that you are a failure or have let yourself or your family down: 0 Trouble concentrating on things, such as reading the newspaper or watching television (PHQ Adolescent also includes...like school work): 0 Moving or speaking so slowly that other people could have noticed. Or the opposite - being so fidgety or restless that you have been moving around a lot more than usual: 0 Thoughts that you would be better off dead, or of hurting yourself in some way: 0 PHQ-9 Total Score: 0 If you checked off any problems, how difficult have these problems made it for you to do your work, take care of things at  home, or get along with other people?: Not difficult at all  Depression Treatment Depression Interventions/Treatment : EYV7-0 Score <4 Follow-up Not Indicated     Goals Addressed               This Visit's Progress     I'd like to lose weight and stay active and healthy (pt-stated)               Objective:    Today's Vitals   05/21/24 1146  BP: 119/74  Pulse: 85  Resp: 12  SpO2: 93%  Weight: 217 lb (98.4 kg)  Height: 5' 6 (1.676 m)   Body mass index is 35.02 kg/m.  Hearing/Vision screen Hearing Screening - Comments:: Patient denies any hearing difficulties.  Vision Screening - Comments:: Patient is up to date on yearly eye exams with Oneil Kawasaki. Will fax for last ov notes  Immunizations and Health Maintenance Health Maintenance  Topic Date Due   Bone Density Scan  02/13/2018   OPHTHALMOLOGY EXAM  02/27/2024   Medicare Annual Wellness (AWV)  03/31/2024   Diabetic kidney evaluation - Urine ACR  09/22/2024   HEMOGLOBIN A1C  11/10/2024   Mammogram  03/08/2025   Diabetic kidney evaluation - eGFR measurement  05/13/2025   FOOT EXAM  05/21/2025   Colonoscopy  08/07/2025   Pneumococcal Vaccine: 50+ Years  Completed   Influenza Vaccine  Completed   Hepatitis C Screening  Completed   Zoster Vaccines- Shingrix  Completed   Meningococcal B Vaccine  Aged Out   DTaP/Tdap/Td  Discontinued   COVID-19 Vaccine  Discontinued        Assessment/Plan:  This is a routine wellness examination for Helen Mahoney.  Patient Care Team: Helen Rollene BRAVO, MD as PCP - General Carver, Carlin POUR, DO as Consulting Physician (Internal Medicine) Jacinto Lonni PARAS, RPH (Inactive) (Pharmacist) Kawasaki Oneil, DO [facility])  I have personally reviewed and noted the following in the patients chart:   Medical and social history Use of alcohol, tobacco or illicit drugs  Current medications and supplements including opioid prescriptions. Functional ability and  status Nutritional status Physical activity Advanced directives List of other physicians Hospitalizations, surgeries, and ER visits in previous 12 months Vitals Screenings to include cognitive, depression, and falls Referrals and appointments  Orders Placed This Encounter  Procedures   DG Bone Density    Standing Status:   Future    Expiration Date:   05/21/2025    Reason for Exam (SYMPTOM  OR DIAGNOSIS REQUIRED):   post menopausal estrogen deficient    Preferred imaging location?:   [facility]   In addition, I have reviewed and discussed with patient certain preventive protocols, quality metrics, and best practice recommendations. A written personalized care plan for preventive services as well as general preventive health recommendations were provided to patient.   Iden Stripling, CMA   05/21/2024   Return May 23, 2025 at 1:10 pm, for In office Medicare Well Visit w  Wellness Nurse.  After Visit Summary: (MyChart) Due to this being a telephonic visit, the after visit summary with patients personalized plan was offered to patient via MyChart    "

## 2024-05-21 NOTE — Patient Instructions (Signed)
 Ms. Picone,  Thank you for taking the time for your Medicare Wellness Visit. I appreciate your continued commitment to your health goals. Please review the care plan we discussed, and feel free to reach out if I can assist you further.  Please note that Annual Wellness Visits do not include a physical exam. Some assessments may be limited, especially if the visit was conducted virtually. If needed, we may recommend an in-person follow-up with your provider.  Ongoing Care Seeing your primary care provider every 3 to 6 months helps us  monitor your health and provide consistent, personalized care.   Referrals If a referral was made during today's visit and you haven't received any updates within two weeks, please contact the referred provider directly to check on the status.  Osteoporosis Screening An order was placed for you to have your Osteoporosis Screening. Call the number below to schedule that [facility]  [phone]   Recommended Screenings:  Health Maintenance  Topic Date Due   Osteoporosis screening with Bone Density Scan  02/13/2018   Eye exam for diabetics  02/27/2024   Medicare Annual Wellness Visit  03/31/2024   Kidney health urinalysis for diabetes  09/22/2024   Hemoglobin A1C  11/10/2024   Breast Cancer Screening  03/08/2025   Yearly kidney function blood test for diabetes  05/13/2025   Complete foot exam   05/21/2025   Colon Cancer Screening  08/07/2025   Pneumococcal Vaccine for age over 66  Completed   Flu Shot  Completed   Hepatitis C Screening  Completed   Zoster (Shingles) Vaccine  Completed   Meningitis B Vaccine  Aged Out   DTaP/Tdap/Td vaccine  Discontinued   COVID-19 Vaccine  Discontinued       05/21/2024   11:50 AM  Advanced Directives  Does Patient Have a Medical Advance Directive? No  Would patient like information on creating a medical advance directive? Yes (MAU/Ambulatory/Procedural Areas - Information given)    Vision: Annual vision  screenings are recommended for early detection of glaucoma, cataracts, and diabetic retinopathy. These exams can also reveal signs of chronic conditions such as diabetes and high blood pressure.  Dental: Annual dental screenings help detect early signs of oral cancer, gum disease, and other conditions linked to overall health, including heart disease and diabetes.  Please see the attached documents for additional preventive care recommendations.

## 2024-05-21 NOTE — Assessment & Plan Note (Signed)
 Refer for coronary calcium  score

## 2024-05-21 NOTE — Patient Instructions (Addendum)
" °  F/u early June  May 28 or after,Urine ACR, fasting lipid, cmp and EGFR, HBA1C, TSH, vit D and CBc  Reduce glipizide  to one daily  It is important that you exercise regularly at least 30 minutes 5 times a week. If you develop chest pain, have severe difficulty breathing, or feel very tired, stop exercising immediately and seek medical attention    Start bedtime snack at 10 pm of 2 peanut butter cracker biscuits since you are bottoming out overnight, start with water   Nurse pls send for eye exam done in Fall 2025, Dr  Darroll  Thanks for choosing [facility], we consider it a privelige to serve you.  "

## 2024-05-21 NOTE — Telephone Encounter (Signed)
 Pt aware.

## 2024-05-21 NOTE — Assessment & Plan Note (Signed)

## 2024-05-21 NOTE — Telephone Encounter (Signed)
 Please contact patient and let her aware what she needs to do for the scoring test.   [phone] Teams message was sent out:  simpson patient ct cardiac scoring who/where does patient go and call?  Helen Mahoney . i don't see no referral for cardiologist dr in her chart?? patient is waiting in lobby for answer.thank you

## 2024-05-21 NOTE — Progress Notes (Signed)
 "   Helen Mahoney     MRN: [id number]      DOB: 10-02-1950  Chief Complaint  Patient presents with   Annual Exam    Cpe.     HPI: Patient is in for annual physical exam. C/o pigmented lesions on lower ext and feet  Recent labs,  are reviewed. Immunization is reviewed , and  updated if needed.   PE: BP 119/74   Pulse 85   Ht 5' 6 (1.676 m)   Wt 217 lb 9.6 oz (98.7 kg)   SpO2 93%   BMI 35.12 kg/m   Pleasant  female, alert and oriented x 3, in no cardio-pulmonary distress. Afebrile. HEENT No facial trauma or asymetry. Sinuses non tender.  Extra occullar muscles intact.. External ears normal, . Neck: supple, no adenopathy,JVD or thyromegaly.No bruits.  Chest: Clear to ascultation bilaterally.No crackles or wheezes. Non tender to palpation  Cardiovascular system; Heart sounds normal,  S1 and  S2 ,no S3.  No murmur, or thrill. Apical beat not displaced Peripheral pulses normal.  Abdomen: Soft, non tender, no organomegaly or masses. No bruits. Bowel sounds normal. No guarding, tenderness or rebound.     Musculoskeletal exam: Full ROM of spine, hips , shoulders and reduced in right  knees. Mild deformity ,swelling and  crepitus noted in right knee No muscle wasting or atrophy.   Neurologic: Cranial nerves 2 to 12 intact. Power, tone ,sensation  normal throughout. No disturbance in gait. No tremor.  Skin: Intact, no ulceration, erythema , hyperpigmented lesions  noted in patches on lower extremities and feet  Psych; Normal mood and affect. Judgement and concentration normal   Assessment & Plan:  Annual visit for general adult medical examination with abnormal findings Annual exam as documented. Counseling done  re healthy lifestyle involving commitment to 150 minutes exercise per week, heart healthy diet, and attaining healthy weight.The importance of adequate sleep also discussed. Regular seat belt use and home safety, is also discussed. Changes  in health habits are decided on by the patient with goals and time frames  set for achieving them. Immunization and cancer screening needs are specifically addressed at this visit.   Diabetes mellitus with kidney complication, with long-term current use of insulin  (HCC) Diabetes associated with hypertension, hyperlipidemia, obesity, and CKD  Helen Mahoney is reminded of the importance of commitment to daily physical activity for 30 minutes or more, as able and the need to limit carbohydrate intake to 30 to 60 grams per meal to help with blood sugar control.  Reports hypoglycemia overnight, reduce to glipizide  ER 10 mg one dailyand have bedtime snack The need to take medication as prescribed, test blood sugar as directed, and to call between visits if there is a concern that blood sugar is uncontrolled is also discussed.   Helen Mahoney is reminded of the importance of daily foot exam, annual eye examination, and good blood sugar, blood pressure and cholesterol control.     Latest Ref Rng & Units 05/13/2024    8:07 AM 04/20/2024    1:34 PM 12/15/2023    8:37 AM 12/11/2023    1:44 PM 09/23/2023    2:20 PM  Diabetic Labs  HbA1c 4.8 - 5.6 % 7.4   7.7   7.7   Micro/Creat Ratio 0 - 29 mg/g creat     10   Chol 100 - 199 mg/dL   893     HDL >60 mg/dL   48     Calc LDL  0 - 99 mg/dL   44     Triglycerides 0 - 149 mg/dL   63     Creatinine 9.42 - 1.00 mg/dL 8.55  8.66  8.45  8.43  1.42       05/21/2024   11:46 AM 05/21/2024    8:05 AM 05/04/2024   10:50 AM 04/27/2024    1:30 PM 01/29/2024   10:01 AM 01/09/2024    9:32 AM 01/09/2024    8:00 AM  BP/Weight  Systolic BP 119 119  112  95 108  Diastolic BP 74 74  52  62 66  Wt. (Lbs) 217 217.6 218 210 223.4    BMI 35.02 kg/m2 35.12 kg/m2 35.19 kg/m2 33.89 kg/m2 36.06 kg/m2        Latest Ref Rng & Units 05/21/2024    8:00 AM 02/27/2023   12:00 AM  Foot/eye exam completion dates  Eye Exam No Retinopathy  No Retinopathy      Foot Form Completion  Done       This result is from an external source.        At increased risk for cardiovascular disease Refer for coronary calcium  score  Pigmented skin lesions Over 6 month h/o hyperpigmented pruritic lesions on legs and feet, refer dermatology   "

## 2024-05-21 NOTE — Telephone Encounter (Signed)
 Pals explain she will get a call from the hospital to get the appt scheduled for her coronary scan, she will get this at Radiology dept, hospital schedules this

## 2024-05-21 NOTE — Assessment & Plan Note (Addendum)
 Diabetes associated with hypertension, hyperlipidemia, obesity, and CKD  Helen Mahoney is reminded of the importance of commitment to daily physical activity for 30 minutes or more, as able and the need to limit carbohydrate intake to 30 to 60 grams per meal to help with blood sugar control.  Reports hypoglycemia overnight, reduce to glipizide  ER 10 mg one dailyand have bedtime snack The need to take medication as prescribed, test blood sugar as directed, and to call between visits if there is a concern that blood sugar is uncontrolled is also discussed.   Helen Mahoney is reminded of the importance of daily foot exam, annual eye examination, and good blood sugar, blood pressure and cholesterol control.     Latest Ref Rng & Units 05/13/2024    8:07 AM 04/20/2024    1:34 PM 12/15/2023    8:37 AM 12/11/2023    1:44 PM 09/23/2023    2:20 PM  Diabetic Labs  HbA1c 4.8 - 5.6 % 7.4   7.7   7.7   Micro/Creat Ratio 0 - 29 mg/g creat     10   Chol 100 - 199 mg/dL   893     HDL >60 mg/dL   48     Calc LDL 0 - 99 mg/dL   44     Triglycerides 0 - 149 mg/dL   63     Creatinine 9.42 - 1.00 mg/dL 8.55  8.66  8.45  8.43  1.42       05/21/2024   11:46 AM 05/21/2024    8:05 AM 05/04/2024   10:50 AM 04/27/2024    1:30 PM 01/29/2024   10:01 AM 01/09/2024    9:32 AM 01/09/2024    8:00 AM  BP/Weight  Systolic BP 119 119  112  95 108  Diastolic BP 74 74  52  62 66  Wt. (Lbs) 217 217.6 218 210 223.4    BMI 35.02 kg/m2 35.12 kg/m2 35.19 kg/m2 33.89 kg/m2 36.06 kg/m2        Latest Ref Rng & Units 05/21/2024    8:00 AM 02/27/2023   12:00 AM  Foot/eye exam completion dates  Eye Exam No Retinopathy  No Retinopathy      Foot Form Completion  Done      This result is from an external source.

## 2024-05-22 ENCOUNTER — Other Ambulatory Visit: Payer: Self-pay | Admitting: Family Medicine

## 2024-05-22 DIAGNOSIS — L819 Disorder of pigmentation, unspecified: Secondary | ICD-10-CM | POA: Insufficient documentation

## 2024-05-22 NOTE — Assessment & Plan Note (Signed)
 Over 6 month h/o hyperpigmented pruritic lesions on legs and feet, refer dermatology

## 2024-05-22 NOTE — Addendum Note (Signed)
 Addended by: ANTONETTA ROLLENE BRAVO on: 05/22/2024 12:17 AM   Modules accepted: Orders

## 2024-06-04 ENCOUNTER — Ambulatory Visit (HOSPITAL_COMMUNITY): Admission: RE | Admit: 2024-06-04 | Source: Ambulatory Visit

## 2024-06-04 DIAGNOSIS — Z780 Asymptomatic menopausal state: Secondary | ICD-10-CM

## 2024-06-30 ENCOUNTER — Other Ambulatory Visit (HOSPITAL_COMMUNITY)

## 2024-08-02 ENCOUNTER — Encounter: Admitting: Nutrition

## 2024-08-19 ENCOUNTER — Inpatient Hospital Stay

## 2024-08-26 ENCOUNTER — Inpatient Hospital Stay: Admitting: Oncology

## 2024-10-01 ENCOUNTER — Ambulatory Visit: Payer: Self-pay | Admitting: Family Medicine

## 2025-05-23 ENCOUNTER — Ambulatory Visit: Payer: Self-pay
